# Patient Record
Sex: Female | Born: 1960
Health system: Southern US, Community
[De-identification: ages and names within clinical notes are randomized; demographics above are authoritative.]

## PROBLEM LIST (undated history)

## (undated) DIAGNOSIS — R0609 Other forms of dyspnea: Secondary | ICD-10-CM

## (undated) DIAGNOSIS — D219 Benign neoplasm of connective and other soft tissue, unspecified: Secondary | ICD-10-CM

## (undated) DIAGNOSIS — R51 Headache: Secondary | ICD-10-CM

## (undated) DIAGNOSIS — K219 Gastro-esophageal reflux disease without esophagitis: Secondary | ICD-10-CM

## (undated) DIAGNOSIS — R0600 Dyspnea, unspecified: Secondary | ICD-10-CM

## (undated) DIAGNOSIS — E669 Obesity, unspecified: Secondary | ICD-10-CM

## (undated) DIAGNOSIS — M1990 Unspecified osteoarthritis, unspecified site: Secondary | ICD-10-CM

## (undated) DIAGNOSIS — J40 Bronchitis, not specified as acute or chronic: Secondary | ICD-10-CM

## (undated) DIAGNOSIS — Z720 Tobacco use: Secondary | ICD-10-CM

## (undated) DIAGNOSIS — R002 Palpitations: Secondary | ICD-10-CM

## (undated) DIAGNOSIS — I219 Acute myocardial infarction, unspecified: Secondary | ICD-10-CM

## (undated) DIAGNOSIS — I639 Cerebral infarction, unspecified: Secondary | ICD-10-CM

## (undated) DIAGNOSIS — E785 Hyperlipidemia, unspecified: Secondary | ICD-10-CM

## (undated) DIAGNOSIS — F329 Major depressive disorder, single episode, unspecified: Secondary | ICD-10-CM

## (undated) DIAGNOSIS — F32A Depression, unspecified: Secondary | ICD-10-CM

## (undated) DIAGNOSIS — F418 Other specified anxiety disorders: Secondary | ICD-10-CM

## (undated) DIAGNOSIS — J449 Chronic obstructive pulmonary disease, unspecified: Secondary | ICD-10-CM

## (undated) DIAGNOSIS — I359 Nonrheumatic aortic valve disorder, unspecified: Secondary | ICD-10-CM

## (undated) DIAGNOSIS — I10 Essential (primary) hypertension: Secondary | ICD-10-CM

## (undated) DIAGNOSIS — R55 Syncope and collapse: Secondary | ICD-10-CM

## (undated) HISTORY — DX: Unspecified osteoarthritis, unspecified site: M19.90

## (undated) HISTORY — DX: Hyperlipidemia, unspecified: E78.5

## (undated) HISTORY — DX: Nonrheumatic aortic valve disorder, unspecified: I35.9

## (undated) HISTORY — DX: Bronchitis, not specified as acute or chronic: J40

## (undated) HISTORY — DX: Obesity, unspecified: E66.9

## (undated) HISTORY — DX: Dyspnea, unspecified: R06.00

## (undated) HISTORY — DX: Essential (primary) hypertension: I10

## (undated) HISTORY — DX: Syncope and collapse: R55

## (undated) HISTORY — DX: Benign neoplasm of connective and other soft tissue, unspecified: D21.9

## (undated) HISTORY — PX: TUBAL LIGATION: SHX77

## (undated) HISTORY — DX: Tobacco use: Z72.0

## (undated) HISTORY — DX: Other specified anxiety disorders: F41.8

## (undated) HISTORY — DX: Other forms of dyspnea: R06.09

## (undated) HISTORY — DX: Palpitations: R00.2

---

## 2007-01-23 ENCOUNTER — Ambulatory Visit (HOSPITAL_COMMUNITY): Admission: RE | Admit: 2007-01-23 | Discharge: 2007-01-23 | Payer: Self-pay | Admitting: Family Medicine

## 2008-05-16 ENCOUNTER — Encounter: Admission: RE | Admit: 2008-05-16 | Discharge: 2008-05-16 | Payer: Self-pay | Admitting: General Surgery

## 2008-12-12 ENCOUNTER — Ambulatory Visit (HOSPITAL_COMMUNITY): Admission: RE | Admit: 2008-12-12 | Discharge: 2008-12-12 | Payer: Self-pay | Admitting: Family Medicine

## 2009-07-09 ENCOUNTER — Ambulatory Visit (HOSPITAL_COMMUNITY): Admission: RE | Admit: 2009-07-09 | Discharge: 2009-07-09 | Payer: Self-pay | Admitting: Family Medicine

## 2009-12-14 ENCOUNTER — Emergency Department (HOSPITAL_COMMUNITY): Admission: EM | Admit: 2009-12-14 | Discharge: 2009-12-14 | Payer: Self-pay | Admitting: Emergency Medicine

## 2010-09-01 ENCOUNTER — Ambulatory Visit: Payer: Self-pay | Admitting: Cardiology

## 2010-09-01 ENCOUNTER — Ambulatory Visit (HOSPITAL_COMMUNITY): Admission: RE | Admit: 2010-09-01 | Discharge: 2010-09-01 | Payer: Self-pay | Admitting: Gastroenterology

## 2010-09-01 ENCOUNTER — Encounter: Payer: Self-pay | Admitting: Gastroenterology

## 2010-10-06 ENCOUNTER — Encounter (HOSPITAL_COMMUNITY)
Admission: RE | Admit: 2010-10-06 | Discharge: 2010-11-05 | Payer: Self-pay | Source: Home / Self Care | Attending: Neurology | Admitting: Neurology

## 2010-11-06 ENCOUNTER — Encounter (HOSPITAL_COMMUNITY)
Admission: RE | Admit: 2010-11-06 | Discharge: 2010-12-06 | Payer: Self-pay | Source: Home / Self Care | Attending: Neurology | Admitting: Neurology

## 2011-05-07 ENCOUNTER — Encounter: Payer: Self-pay | Admitting: Cardiology

## 2011-05-07 DIAGNOSIS — E669 Obesity, unspecified: Secondary | ICD-10-CM | POA: Insufficient documentation

## 2011-05-07 DIAGNOSIS — E119 Type 2 diabetes mellitus without complications: Secondary | ICD-10-CM | POA: Insufficient documentation

## 2011-05-07 DIAGNOSIS — I10 Essential (primary) hypertension: Secondary | ICD-10-CM | POA: Insufficient documentation

## 2011-05-07 DIAGNOSIS — R55 Syncope and collapse: Secondary | ICD-10-CM | POA: Insufficient documentation

## 2011-05-10 ENCOUNTER — Encounter: Payer: Self-pay | Admitting: *Deleted

## 2011-05-10 ENCOUNTER — Encounter: Payer: Self-pay | Admitting: Cardiology

## 2011-05-10 ENCOUNTER — Ambulatory Visit (INDEPENDENT_AMBULATORY_CARE_PROVIDER_SITE_OTHER): Payer: Self-pay | Admitting: Cardiology

## 2011-05-10 DIAGNOSIS — E119 Type 2 diabetes mellitus without complications: Secondary | ICD-10-CM

## 2011-05-10 DIAGNOSIS — R55 Syncope and collapse: Secondary | ICD-10-CM

## 2011-05-10 DIAGNOSIS — R0609 Other forms of dyspnea: Secondary | ICD-10-CM

## 2011-05-10 DIAGNOSIS — F418 Other specified anxiety disorders: Secondary | ICD-10-CM

## 2011-05-10 DIAGNOSIS — R0600 Dyspnea, unspecified: Secondary | ICD-10-CM

## 2011-05-10 DIAGNOSIS — R002 Palpitations: Secondary | ICD-10-CM

## 2011-05-10 DIAGNOSIS — I10 Essential (primary) hypertension: Secondary | ICD-10-CM

## 2011-05-10 DIAGNOSIS — E785 Hyperlipidemia, unspecified: Secondary | ICD-10-CM

## 2011-05-10 DIAGNOSIS — F17200 Nicotine dependence, unspecified, uncomplicated: Secondary | ICD-10-CM

## 2011-05-10 DIAGNOSIS — I359 Nonrheumatic aortic valve disorder, unspecified: Secondary | ICD-10-CM

## 2011-05-10 DIAGNOSIS — Z720 Tobacco use: Secondary | ICD-10-CM

## 2011-05-10 NOTE — Patient Instructions (Signed)
Your physician recommends that you schedule a follow-up appointment in:  After tests Your physician recommends that you return for lab work in: next week A chest x-ray takes a picture of the organs and structures inside the chest, including the heart, lungs, and blood vessels. This test can show several things, including, whether the heart is enlarges; whether fluid is building up in the lungs; and whether pacemaker / defibrillator leads are still in place. Your physician has requested that you have en exercise stress myoview. For further information please visit [email or web address] Please follow instruction sheet, as given.  Your physician has recommended that you have a pulmonary function test. Pulmonary Function Tests are a group of tests that measure how well air moves in and out of your lungs.  A chest x-ray takes a picture of the organs and structures inside the chest, including the heart, lungs, and blood vessels. This test can show several things, including, whether the heart is enlarges; whether fluid is building up in the lungs; and whether pacemaker / defibrillator leads are still in place.

## 2011-05-11 ENCOUNTER — Other Ambulatory Visit: Payer: Self-pay | Admitting: Cardiology

## 2011-05-11 ENCOUNTER — Encounter: Payer: Self-pay | Admitting: Cardiology

## 2011-05-11 DIAGNOSIS — F418 Other specified anxiety disorders: Secondary | ICD-10-CM | POA: Insufficient documentation

## 2011-05-11 DIAGNOSIS — R002 Palpitations: Secondary | ICD-10-CM | POA: Insufficient documentation

## 2011-05-11 DIAGNOSIS — I359 Nonrheumatic aortic valve disorder, unspecified: Secondary | ICD-10-CM | POA: Insufficient documentation

## 2011-05-11 DIAGNOSIS — Z720 Tobacco use: Secondary | ICD-10-CM | POA: Insufficient documentation

## 2011-05-11 NOTE — Assessment & Plan Note (Signed)
Diabetes is mild and apparently adequately controlled with medical therapy.  Weight loss would be desirable.

## 2011-05-11 NOTE — Assessment & Plan Note (Addendum)
Examination suggests the presence of aortic sclerosis or mild aortic stenosis.  Considering the fact that the murmur has reportedly been present for years, a bicuspid aortic valve is most likely.  An echocardiogram performed in 08/2010 revealed aortic annular calcification with mild insufficiency and no stenosis.  No other abnormalities were identified.  Repeat echocardiography will be deferred for the present.

## 2011-05-11 NOTE — Assessment & Plan Note (Signed)
Patient reports episodes of rapid heart action on a daily basis.  A Holter study will be obtained to further investigate.

## 2011-05-11 NOTE — Assessment & Plan Note (Addendum)
Symptoms are likely multifactorial in etiology with a definite contribution of obesity and physical deconditioning.  Sleep apnea or hypoventilation obesity syndrome may be present.  Her asthma appears mild, but PFTs, and ABG and a chest x-ray will be obtained as well as basic laboratory studies including a TSH, proBNP level and d-dimer.  I will reassess this nice woman once testing has been completed.

## 2011-05-11 NOTE — Assessment & Plan Note (Addendum)
Blood pressure control appears to be excellent; current medications will be continued.

## 2011-05-11 NOTE — Progress Notes (Signed)
HPI:  Ms. Murley is seen in the office at the kind request of the [facility] for evaluation of syncope.  This nice woman has not previously been evaluated by cardiologist nor undergone any major cardiac testing.  After auscultation of a murmur, an echocardiogram reportedly demonstrated a minor aortic valve problem.  During the past 2 months she has experienced multiple episodes of loss of consciousness.  These are only associated with exertion, but occur at a very low level of exercise.  She experiences dyspnea, then lightheadedness and subsequently falls to the floor.  Loss of consciousness persists for no more than one minute.  She awakens without any confusion.  There has been no loss of bowel or bladder function.  She is not known to have absent epilepsy or other neurologic conditions.  She has not sustained any significant injury.  She describes mild orthostatic dizziness.  Records were obtained from the [facility] and reviewed.  She has received advice regarding obesity and cigarette smoking, but has lost no weight and only tapered tobacco consumption.  CBC normal in 03/2011 as was chemistry profile except for a glucose of 116.  TSH-1.9 with a normal free T4.  Hemoglobin A1c-7.0.  Stool for occult blood negative.  Lipid profile good with TC-178, triglycerides of 128, HDL 49 and LDL of 98.   Current Outpatient Prescriptions on File Prior to Visit  Medication Sig Dispense Refill  . acetaminophen (TYLENOL) 650 MG CR tablet Take 650 mg by mouth every 8 (eight) hours as needed.        Marland Kitchen amLODipine (NORVASC) 10 MG tablet Take 5 mg by mouth daily.         Allergies  Allergen Reactions  . Aspirin     hives  . Penicillins     rash      Past Medical History  Diagnosis Date  . Syncope   . Dyspnea   . DM (diabetes mellitus)   . Hypertension   . Obesity   . Asthma   . Tobacco user     30 pack years; 04/2011: 1/4 pack per day during quick attempt     Past Surgical History    Procedure Date  . Tubal ligation      Family History  Problem Relation Age of Onset  . Lung cancer Mother   . Heart disease Brother   . Breast cancer Sister      History   Social History  . Marital Status: Single    Spouse Name: N/A    Number of Children: 4  . Years of Education: N/A   Occupational History  . unemployed    Social History Main Topics  . Smoking status: Current Everyday Smoker -- 0.3 packs/day    Types: Cigarettes  . Smokeless tobacco: Never Used  . Alcohol Use: No  . Drug Use: No  . Sexually Active: Not on file   Other Topics Concern  . Not on file   Social History Narrative  . No narrative on file     ROS:   Intermittent left-sided headache; requires corrective lenses; mild intermittent palpitations; history of gastric mass-nature uncertain; gastroesophageal reflux disease symptoms; urinary frequency; arthritic discomfort in the knees and right elbow; intermittent pedal edema; intermittent mild asthmatic symptoms-multiple Emergency Room visits but no hospital admissions   All other systems reviewed and are negative.  PHYSICAL EXAM: BP 120/69  Pulse 73  Ht 5\' 6"  (1.676 m)  Wt 299 lb (135.626 kg)  BMI 48.26  kg/m2  SpO2 97%  General-Well-developed; no acute distress Body Habitus-obese HEENT-[place]/AT; PERRL; EOM intact; conjunctiva and lids nl Neck-No JVD; no carotid bruits; normal carotid upstrokes Endocrine-No thyromegaly Lungs-Clear lung fields; resonant percussion; normal I-to-E ratio; decreased breath sounds at the bases Cardiovascular- normal PMI; normal S1 and S2; grade 2/6 basilar systolic ejection murmur Abdomen-BS normal; soft and non-tender without masses or organomegaly Musculoskeletal-No deformities, cyanosis or clubbing Neurologic-Nl cranial nerves; symmetric strength and tone Skin- Warm, no significant lesions Extremities-Nl distal pulses; trace edema   EKG:  Normal sinus rhythm; left atrial abnormality; delayed R wave  progression-cannot exclude previous anteroseptal MI; no previous tracing for comparison.  ASSESSMENT AND PLAN:

## 2011-05-11 NOTE — Assessment & Plan Note (Addendum)
Exertional syncope is certainly of concern.  HOCM and aortic stenosis were excluded by previous echocardiography and by her current examination.  There is no prolongation of the QT interval nor other significant abnormalities on EKG.  A stress Myoview will be performed to evaluate exercise tolerance, to evaluate exercise oxygenation, to exclude arrhythmias and to exclude the low possibility of myocardial ischemia.

## 2011-05-12 LAB — LIPID PANEL
Cholesterol: 175 mg/dL (ref 0–200)
HDL: 52 mg/dL (ref >39)
Total CHOL/HDL Ratio: 3.4 Ratio

## 2011-05-12 LAB — CBC WITH DIFFERENTIAL/PLATELET
Eosinophils Absolute: 0.1 10*3/uL (ref 0.0–0.7)
Hemoglobin: 12.2 g/dL (ref 12.0–15.0)
Lymphs Abs: 2.1 10*3/uL (ref 0.7–4.0)
MCH: 30.6 pg (ref 26.0–34.0)
MCV: 94.5 fL (ref 78.0–100.0)
Monocytes Relative: 10 % (ref 3–12)
Neutrophils Relative %: 44 % (ref 43–77)
RBC: 3.99 MIL/uL (ref 3.87–5.11)

## 2011-05-12 LAB — COMPREHENSIVE METABOLIC PANEL
Alkaline Phosphatase: 86 U/L (ref 39–117)
BUN: 12 mg/dL (ref 6–23)
Creat: 0.71 mg/dL (ref 0.50–1.10)
Glucose, Bld: 121 mg/dL — ABNORMAL HIGH (ref 70–99)
Total Bilirubin: 0.6 mg/dL (ref 0.3–1.2)

## 2011-05-12 LAB — D-DIMER, QUANTITATIVE: D-Dimer, Quant: 0.38 ug/mL-FEU (ref 0.00–0.48)

## 2011-05-17 ENCOUNTER — Other Ambulatory Visit: Payer: Self-pay | Admitting: Cardiology

## 2011-05-17 DIAGNOSIS — R55 Syncope and collapse: Secondary | ICD-10-CM

## 2011-05-19 ENCOUNTER — Encounter (HOSPITAL_COMMUNITY): Payer: Self-pay

## 2011-05-19 ENCOUNTER — Ambulatory Visit (HOSPITAL_COMMUNITY)
Admission: RE | Admit: 2011-05-19 | Discharge: 2011-05-19 | Disposition: A | Discharge status: Home / Self Care | Payer: Self-pay | Source: Ambulatory Visit | Attending: Cardiology | Admitting: Cardiology

## 2011-05-19 ENCOUNTER — Encounter (HOSPITAL_COMMUNITY)
Admission: RE | Admit: 2011-05-19 | Discharge: 2011-05-19 | Disposition: A | Discharge status: Home / Self Care | Payer: Self-pay | Source: Ambulatory Visit | Attending: Cardiology | Admitting: Cardiology

## 2011-05-19 ENCOUNTER — Ambulatory Visit (INDEPENDENT_AMBULATORY_CARE_PROVIDER_SITE_OTHER): Payer: Self-pay | Admitting: *Deleted

## 2011-05-19 DIAGNOSIS — R0602 Shortness of breath: Secondary | ICD-10-CM

## 2011-05-19 DIAGNOSIS — R55 Syncope and collapse: Secondary | ICD-10-CM

## 2011-05-19 DIAGNOSIS — R079 Chest pain, unspecified: Secondary | ICD-10-CM | POA: Insufficient documentation

## 2011-05-19 DIAGNOSIS — R0609 Other forms of dyspnea: Secondary | ICD-10-CM | POA: Insufficient documentation

## 2011-05-19 DIAGNOSIS — R0989 Other specified symptoms and signs involving the circulatory and respiratory systems: Secondary | ICD-10-CM | POA: Insufficient documentation

## 2011-05-19 LAB — BLOOD GAS, ARTERIAL
FIO2: 0.21 %
O2 Saturation: 97.3 %
Patient temperature: 37
pH, Arterial: 7.396 (ref 7.350–7.400)

## 2011-05-19 MED ORDER — TECHNETIUM TC 99M TETROFOSMIN IV KIT
10.0000 | PACK | Freq: Once | INTRAVENOUS | Status: AC | PRN
  Administered 2011-05-19: 10.5 via INTRAVENOUS

## 2011-05-19 MED ORDER — TECHNETIUM TC 99M TETROFOSMIN IV KIT
30.0000 | PACK | Freq: Once | INTRAVENOUS | Status: AC | PRN
  Administered 2011-05-19: 31 via INTRAVENOUS

## 2011-05-19 NOTE — Progress Notes (Deleted)

## 2011-05-19 NOTE — Progress Notes (Signed)
I can not close 

## 2011-05-20 NOTE — Progress Notes (Signed)
Still not right

## 2011-05-31 ENCOUNTER — Encounter: Payer: Self-pay | Admitting: Adult Health

## 2011-06-10 ENCOUNTER — Ambulatory Visit (INDEPENDENT_AMBULATORY_CARE_PROVIDER_SITE_OTHER): Payer: Self-pay | Admitting: Cardiology

## 2011-06-10 ENCOUNTER — Ambulatory Visit (HOSPITAL_COMMUNITY)
Admission: RE | Admit: 2011-06-10 | Discharge: 2011-06-10 | Disposition: A | Discharge status: Home / Self Care | Payer: Self-pay | Source: Ambulatory Visit | Attending: Cardiology | Admitting: Cardiology

## 2011-06-10 ENCOUNTER — Encounter: Payer: Self-pay | Admitting: Cardiology

## 2011-06-10 DIAGNOSIS — I10 Essential (primary) hypertension: Secondary | ICD-10-CM | POA: Insufficient documentation

## 2011-06-10 DIAGNOSIS — R0609 Other forms of dyspnea: Secondary | ICD-10-CM | POA: Insufficient documentation

## 2011-06-10 DIAGNOSIS — E119 Type 2 diabetes mellitus without complications: Secondary | ICD-10-CM

## 2011-06-10 DIAGNOSIS — R0600 Dyspnea, unspecified: Secondary | ICD-10-CM

## 2011-06-10 DIAGNOSIS — R55 Syncope and collapse: Secondary | ICD-10-CM

## 2011-06-10 DIAGNOSIS — R0989 Other specified symptoms and signs involving the circulatory and respiratory systems: Secondary | ICD-10-CM | POA: Insufficient documentation

## 2011-06-10 DIAGNOSIS — R0602 Shortness of breath: Secondary | ICD-10-CM

## 2011-06-10 NOTE — Patient Instructions (Addendum)
Your physician recommends that you return for lab work in: TODAY  Your physician recommends that you start Pulmonary rehab  Your physician recommends that you schedule a follow-up appointment in: 6 weeks

## 2011-06-10 NOTE — Assessment & Plan Note (Signed)
Etiology of exercise-induced syncope is unclear.  Symptoms may simply be due to severe physical deconditioning, obesity and moderate chronic lung disease.  I have referred Amber Harris to Pulmonary Rehabilitation in an attempt to increase her exercise capacity.  I will reevaluate this nice woman after she has completed the program in a few months.

## 2011-06-10 NOTE — Progress Notes (Signed)
HPI : Amber Harris returns to the office for continuing assessment and treatment of syncope.  Since her last visit, she has restricted her activity, which has prevented falls or apparent loss of consciousness.  She did attempt to perform a treadmill stress test, but had to be supported during low-level exercise and required conversion of the study to pharmacologic stress, which was negative.  A 48 hour Holter revealed no arrhythmias.  PFTs show mixed restrictive and obstructive disease with a normal ABG.  Chest x-ray is pending.  Patient has never discussed bariatric surgery nor has she been evaluated by an expert in obesity.  Surgical intervention may be appropriate for her.  Current Outpatient Prescriptions on File Prior to Visit  Medication Sig Dispense Refill  . acetaminophen (TYLENOL) 650 MG CR tablet Take 650 mg by mouth every 8 (eight) hours as needed.        Marland Kitchen albuterol (PROVENTIL) 90 MCG/ACT inhaler Inhale 2 puffs into the lungs every 6 (six) hours as needed.        Marland Kitchen lisinopril-hydrochlorothiazide (PRINZIDE,ZESTORETIC) 20-12.5 MG per tablet Take 1 tablet by mouth daily.        . metFORMIN (GLUMETZA) 500 MG (MOD) 24 hr tablet Take 500 mg by mouth daily with breakfast.        . ranitidine (ZANTAC) 150 MG capsule Take 150 mg by mouth daily.        . sertraline (ZOLOFT) 50 MG tablet Take 50 mg by mouth daily.        . traZODone (DESYREL) 100 MG tablet Take 100 mg by mouth at bedtime. Take 1/2 tab at bedtime          Allergies  Allergen Reactions  . Aspirin     hives  . Penicillins     rash      Past medical history, social history, and family history reviewed and updated.  ROS: No chest pain, orthopnea, PND or increase in mild chronic pedal edema.  No cough, sputum or fevers.  PHYSICAL EXAM: BP 129/79  Pulse 56  Ht 5\' 6"  (1.676 m)  Wt 136.079 kg (300 lb)  BMI 48.42 kg/m2  SpO2 97%  LMP 06/09/2011  General-Well developed; no acute distress Body habitus-moderately obese Neck-No  JVD; no carotid bruits Lungs-clear lung fields with decreased breath sounds; resonant to percussion Cardiovascular-normal PMI; normal S1 and S2; basilar systolic ejection murmur Abdomen-normal bowel sounds; soft and non-tender without masses or organomegaly Musculoskeletal-No deformities, no cyanosis or clubbing Neurologic-Normal cranial nerves; symmetric strength and tone Skin-Warm, no significant lesions Extremities-distal pulses intact; trace edema  Laboratory:  D. Dimer-normal; Lipid profile acceptable with values of 175, 100, 52 and 103; normal metabolic profile except for fasting glucose of 121; TSH ordered but not performed; normal CBC.  ASSESSMENT AND PLAN:

## 2011-06-11 LAB — TSH: TSH: 1.055 u[IU]/mL (ref 0.350–4.500)

## 2011-06-15 ENCOUNTER — Encounter: Payer: Self-pay | Admitting: *Deleted

## 2011-06-16 NOTE — Procedures (Signed)
NAMEVICIE, Amber Harris              ACCOUNT NO.:  [id number]  MEDICAL RECORD NO.:  [id number]  LOCATION:                                 FACILITY:  PHYSICIAN:  Gerrit Friends. Dietrich Pates, MD, FACCDATE OF BIRTH:  05-21-1961  DATE OF PROCEDURE: DATE OF DISCHARGE:                               HOLTER MONITOR   REFERRING PHYSICIAN:  Gerrit Friends. Dietrich Pates, MD, [facility]  CLINICAL DATA:  A 50 year old woman with syncope. 1. Continuous electrocardiographic recording was maintained for 47     hours and 30 minutes during which the predominant rhythm was normal     sinus with modest sinus tachycardia and sinus bradycardia.  The     former reached a peak of 137 bpm and the latter a nadir of 44 bpm. 2. No significant arrhythmias were identified.  Rare supraventricular     ectopics occurred at an average rate of fewer than 1 per hour.  No     PVCs or other ventricular arrhythmias occurred. 3. No ST-segment elevation or depression was identified. 4. A complete diary of activity was returned with 10 symptomatic     spells, 1 with palpitations, 5 with dyspnea, not necessarily     occurring with exertion and 4 with dizziness.  In all cases, the     rhythm was normal sinus.  IMPRESSION:  Negative continuous electrocardiographic recording demonstrating no significant arrhythmias and no correlation between EKG and symptoms.     Gerrit Friends. Dietrich Pates, MD, [facility]     RMR/MEDQ  D:  05/28/2011  T:  05/29/2011  Job:  [id number]

## 2011-07-16 ENCOUNTER — Other Ambulatory Visit (HOSPITAL_COMMUNITY)
Admission: RE | Admit: 2011-07-16 | Discharge: 2011-07-16 | Disposition: A | Discharge status: Home / Self Care | Payer: Self-pay | Source: Ambulatory Visit | Attending: Obstetrics and Gynecology | Admitting: Obstetrics and Gynecology

## 2011-07-16 ENCOUNTER — Other Ambulatory Visit: Payer: Self-pay | Admitting: Obstetrics and Gynecology

## 2011-07-16 DIAGNOSIS — Z01419 Encounter for gynecological examination (general) (routine) without abnormal findings: Secondary | ICD-10-CM | POA: Insufficient documentation

## 2011-07-16 DIAGNOSIS — Z113 Encounter for screening for infections with a predominantly sexual mode of transmission: Secondary | ICD-10-CM | POA: Insufficient documentation

## 2011-07-22 ENCOUNTER — Encounter (HOSPITAL_COMMUNITY)
Admission: RE | Admit: 2011-07-22 | Discharge: 2011-07-22 | Disposition: A | Discharge status: Home / Self Care | Payer: Self-pay | Source: Ambulatory Visit | Attending: Cardiology | Admitting: Cardiology

## 2011-07-22 ENCOUNTER — Encounter (HOSPITAL_COMMUNITY): Payer: Self-pay

## 2011-07-22 DIAGNOSIS — R0609 Other forms of dyspnea: Secondary | ICD-10-CM | POA: Insufficient documentation

## 2011-07-22 DIAGNOSIS — R0989 Other specified symptoms and signs involving the circulatory and respiratory systems: Secondary | ICD-10-CM | POA: Insufficient documentation

## 2011-07-22 DIAGNOSIS — Z5189 Encounter for other specified aftercare: Secondary | ICD-10-CM | POA: Insufficient documentation

## 2011-07-22 DIAGNOSIS — F17200 Nicotine dependence, unspecified, uncomplicated: Secondary | ICD-10-CM | POA: Insufficient documentation

## 2011-07-22 DIAGNOSIS — R002 Palpitations: Secondary | ICD-10-CM | POA: Insufficient documentation

## 2011-07-22 NOTE — Progress Notes (Signed)
Encounter addended by: Rolene Course on: 07/22/2011  2:18 PM<BR>     Documentation filed: Normajean Glasgow VN, Chief Complaint Section

## 2011-07-22 NOTE — Progress Notes (Signed)
Orientation completed. Pt is registered. Pt is scheduled to start on Monday 07/26/11 at 1:00pm. Pt is eager to get started but a little nervous about walking on Treadmill.

## 2011-07-22 NOTE — Patient Instructions (Signed)
During orientation advised patient on arrival and appointment times what to wear, what to do before, during and after exercise. Reviewed attendance and class policy. Talked about inclement weather and class consultation policy.   

## 2011-07-23 ENCOUNTER — Encounter: Payer: Self-pay | Admitting: Cardiology

## 2011-07-26 ENCOUNTER — Encounter (HOSPITAL_COMMUNITY)
Admission: RE | Admit: 2011-07-26 | Discharge: 2011-07-26 | Disposition: A | Discharge status: Home / Self Care | Payer: Self-pay | Source: Ambulatory Visit

## 2011-07-26 ENCOUNTER — Encounter: Payer: Self-pay | Admitting: Cardiology

## 2011-07-26 ENCOUNTER — Ambulatory Visit (INDEPENDENT_AMBULATORY_CARE_PROVIDER_SITE_OTHER): Payer: Self-pay | Admitting: Cardiology

## 2011-07-26 DIAGNOSIS — Z720 Tobacco use: Secondary | ICD-10-CM

## 2011-07-26 DIAGNOSIS — F17200 Nicotine dependence, unspecified, uncomplicated: Secondary | ICD-10-CM

## 2011-07-26 DIAGNOSIS — R002 Palpitations: Secondary | ICD-10-CM

## 2011-07-26 DIAGNOSIS — R0989 Other specified symptoms and signs involving the circulatory and respiratory systems: Secondary | ICD-10-CM

## 2011-07-26 MED ORDER — RANITIDINE HCL 150 MG PO CAPS: 150.0000 mg | ORAL_CAPSULE | Freq: Two times a day (BID) | ORAL | Status: DC

## 2011-07-26 NOTE — Progress Notes (Signed)
HPI : [name] returns to the office for continued assessment and management of syncope, palpitations and exertional dyspnea.  She just started pulmonary rehabilitation and tolerated her initial exercise well.  She is scheduled to meet with a dietitian in the near future.  She has noted some increase in single brief palpitations, but no syncope and improved dyspnea.  She experiences aching chest discomfort after retiring for the evening.  She's been taking only 150 mg of ranitidine per day with evening dosing.  Current Outpatient Prescriptions on File Prior to Visit  Medication Sig Dispense Refill  . acetaminophen (TYLENOL) 650 MG CR tablet Take 650 mg by mouth every 8 (eight) hours as needed.        Marland Kitchen albuterol (PROVENTIL) 90 MCG/ACT inhaler Inhale 2 puffs into the lungs every 6 (six) hours as needed.        . gabapentin (NEURONTIN) 100 MG tablet Take 100 mg by mouth 3 (three) times daily.       Marland Kitchen lisinopril-hydrochlorothiazide (PRINZIDE,ZESTORETIC) 20-12.5 MG per tablet Take 1 tablet by mouth 2 (two) times daily.       . metFORMIN (GLUMETZA) 500 MG (MOD) 24 hr tablet Take 500 mg by mouth daily with breakfast.        . metoprolol (LOPRESSOR) 50 MG tablet Take 50 mg by mouth daily.        . sertraline (ZOLOFT) 50 MG tablet Take 100 mg by mouth daily.       . traZODone (DESYREL) 100 MG tablet Take 100 mg by mouth at bedtime. Take 1/2 tab at bedtime         Allergies  Allergen Reactions  . Aspirin     hives  . Penicillins     rash      Past medical history, social history, and family history reviewed and updated.  PHYSICAL EXAM: BP 170/93  Pulse 68  Resp 18  Ht 5\' 6"  (1.676 m)  Wt 296 lb (134.265 kg)  BMI 47.78 kg/m2  General-Well developed; no acute distress Body habitus-marked obesity Neck-No JVD; no carotid bruits; fullness in the left neck, likely lipomatous tissue Lungs-clear lung fields; resonant to percussion; decreased breath sounds at the bases Cardiovascular-normal PMI;  normal S1 and S2 Abdomen-normal bowel sounds; soft and non-tender without masses or organomegaly Musculoskeletal-No deformities, no cyanosis or clubbing Neurologic-Normal cranial nerves; symmetric strength and tone Skin-Warm, no significant lesions Extremities-distal pulses intact; trace edema  EKG: Normal sinus rhythm; borderline left atrial abnormality; slightly delayed R-wave progression; otherwise normal.  No previous tracing for comparison.  ASSESSMENT AND PLAN:

## 2011-07-26 NOTE — Patient Instructions (Signed)
   Increase Zantac to 150mg  twice a day Continue all other current medications. Follow up in  4 months

## 2011-07-26 NOTE — Assessment & Plan Note (Addendum)
Symptoms are stable to improved.  Initial exercise tolerance in the rehabilitation program has beem reasonable.  There is an excellent likelihood that she will improve with further participation.  Dietary therapy with weight loss would likely provide even better functional status.  I will reassess this nice woman when she has had an additional 4 months of exercise and diet therapy.  If there has been no improvement in weight, further consideration can be given to surgical approaches.

## 2011-07-28 ENCOUNTER — Encounter (HOSPITAL_COMMUNITY)
Admission: RE | Admit: 2011-07-28 | Discharge: 2011-07-28 | Disposition: A | Discharge status: Home / Self Care | Payer: Self-pay | Source: Ambulatory Visit

## 2011-08-02 ENCOUNTER — Encounter (HOSPITAL_COMMUNITY): Payer: Self-pay

## 2011-08-03 ENCOUNTER — Other Ambulatory Visit: Payer: Self-pay | Admitting: Obstetrics and Gynecology

## 2011-08-03 ENCOUNTER — Other Ambulatory Visit (HOSPITAL_COMMUNITY)
Admission: RE | Admit: 2011-08-03 | Discharge: 2011-08-03 | Disposition: A | Discharge status: Home / Self Care | Payer: Self-pay | Source: Ambulatory Visit | Attending: Obstetrics and Gynecology | Admitting: Obstetrics and Gynecology

## 2011-08-03 DIAGNOSIS — Z01419 Encounter for gynecological examination (general) (routine) without abnormal findings: Secondary | ICD-10-CM | POA: Insufficient documentation

## 2011-08-04 ENCOUNTER — Encounter (HOSPITAL_COMMUNITY): Payer: Self-pay

## 2011-08-09 ENCOUNTER — Encounter: Payer: Self-pay | Admitting: Cardiology

## 2011-08-09 ENCOUNTER — Encounter (HOSPITAL_COMMUNITY)
Admission: RE | Admit: 2011-08-09 | Discharge: 2011-08-09 | Disposition: A | Discharge status: Home / Self Care | Payer: Self-pay | Source: Ambulatory Visit | Attending: Cardiology | Admitting: Cardiology

## 2011-08-09 DIAGNOSIS — R0609 Other forms of dyspnea: Secondary | ICD-10-CM | POA: Insufficient documentation

## 2011-08-09 DIAGNOSIS — R002 Palpitations: Secondary | ICD-10-CM | POA: Insufficient documentation

## 2011-08-09 DIAGNOSIS — Z5189 Encounter for other specified aftercare: Secondary | ICD-10-CM | POA: Insufficient documentation

## 2011-08-09 DIAGNOSIS — R0989 Other specified symptoms and signs involving the circulatory and respiratory systems: Secondary | ICD-10-CM | POA: Insufficient documentation

## 2011-08-09 DIAGNOSIS — F17200 Nicotine dependence, unspecified, uncomplicated: Secondary | ICD-10-CM | POA: Insufficient documentation

## 2011-08-11 ENCOUNTER — Encounter (HOSPITAL_COMMUNITY)
Admission: RE | Admit: 2011-08-11 | Discharge: 2011-08-11 | Disposition: A | Discharge status: Home / Self Care | Payer: Self-pay | Source: Ambulatory Visit | Attending: Cardiology | Admitting: Cardiology

## 2011-08-16 ENCOUNTER — Encounter (HOSPITAL_COMMUNITY): Payer: Self-pay

## 2011-08-18 ENCOUNTER — Encounter (HOSPITAL_COMMUNITY)
Admission: RE | Admit: 2011-08-18 | Discharge: 2011-08-18 | Disposition: A | Discharge status: Home / Self Care | Payer: Self-pay | Source: Ambulatory Visit | Attending: Cardiology | Admitting: Cardiology

## 2011-08-23 ENCOUNTER — Encounter (HOSPITAL_COMMUNITY)
Admission: RE | Admit: 2011-08-23 | Discharge: 2011-08-23 | Disposition: A | Discharge status: Home / Self Care | Payer: Self-pay | Source: Ambulatory Visit | Attending: Cardiology | Admitting: Cardiology

## 2011-08-25 ENCOUNTER — Encounter (HOSPITAL_COMMUNITY): Payer: Self-pay

## 2011-08-30 ENCOUNTER — Encounter (HOSPITAL_COMMUNITY): Payer: Self-pay

## 2011-09-01 ENCOUNTER — Encounter (HOSPITAL_COMMUNITY): Payer: Self-pay

## 2011-09-06 ENCOUNTER — Encounter (HOSPITAL_COMMUNITY): Payer: Self-pay

## 2011-09-08 ENCOUNTER — Encounter (HOSPITAL_COMMUNITY)
Admission: RE | Admit: 2011-09-08 | Discharge: 2011-09-08 | Disposition: A | Discharge status: Home / Self Care | Payer: Self-pay | Source: Ambulatory Visit | Attending: Cardiology | Admitting: Cardiology

## 2011-09-08 DIAGNOSIS — Z5189 Encounter for other specified aftercare: Secondary | ICD-10-CM | POA: Insufficient documentation

## 2011-09-08 DIAGNOSIS — R002 Palpitations: Secondary | ICD-10-CM | POA: Insufficient documentation

## 2011-09-08 DIAGNOSIS — R0609 Other forms of dyspnea: Secondary | ICD-10-CM | POA: Insufficient documentation

## 2011-09-08 DIAGNOSIS — R0989 Other specified symptoms and signs involving the circulatory and respiratory systems: Secondary | ICD-10-CM | POA: Insufficient documentation

## 2011-09-08 DIAGNOSIS — F17200 Nicotine dependence, unspecified, uncomplicated: Secondary | ICD-10-CM | POA: Insufficient documentation

## 2011-09-12 ENCOUNTER — Other Ambulatory Visit: Payer: Self-pay | Admitting: Obstetrics and Gynecology

## 2011-09-13 ENCOUNTER — Encounter (HOSPITAL_COMMUNITY)
Admission: RE | Admit: 2011-09-13 | Discharge: 2011-09-13 | Disposition: A | Discharge status: Home / Self Care | Payer: Self-pay | Source: Ambulatory Visit | Attending: Cardiology | Admitting: Cardiology

## 2011-09-15 ENCOUNTER — Encounter (HOSPITAL_COMMUNITY)
Admission: RE | Admit: 2011-09-15 | Discharge: 2011-09-15 | Disposition: A | Discharge status: Home / Self Care | Payer: Self-pay | Source: Ambulatory Visit | Attending: Cardiology | Admitting: Cardiology

## 2011-09-20 ENCOUNTER — Encounter (HOSPITAL_COMMUNITY)
Admission: RE | Admit: 2011-09-20 | Discharge: 2011-09-20 | Disposition: A | Discharge status: Home / Self Care | Payer: Self-pay | Source: Ambulatory Visit | Attending: Cardiology | Admitting: Cardiology

## 2011-09-22 ENCOUNTER — Encounter (HOSPITAL_COMMUNITY)
Admission: RE | Admit: 2011-09-22 | Discharge: 2011-09-22 | Disposition: A | Discharge status: Home / Self Care | Payer: Self-pay | Source: Ambulatory Visit | Attending: Cardiology | Admitting: Cardiology

## 2011-09-27 ENCOUNTER — Encounter (HOSPITAL_COMMUNITY)
Admission: RE | Admit: 2011-09-27 | Discharge: 2011-09-27 | Disposition: A | Discharge status: Home / Self Care | Payer: Self-pay | Source: Ambulatory Visit | Attending: Cardiology | Admitting: Cardiology

## 2011-09-29 ENCOUNTER — Encounter (HOSPITAL_COMMUNITY): Payer: Self-pay

## 2011-10-04 ENCOUNTER — Encounter (HOSPITAL_COMMUNITY)
Admission: RE | Admit: 2011-10-04 | Discharge: 2011-10-04 | Disposition: A | Discharge status: Home / Self Care | Payer: Self-pay | Source: Ambulatory Visit | Attending: Cardiology | Admitting: Cardiology

## 2011-10-04 DIAGNOSIS — F17200 Nicotine dependence, unspecified, uncomplicated: Secondary | ICD-10-CM | POA: Insufficient documentation

## 2011-10-04 DIAGNOSIS — R0609 Other forms of dyspnea: Secondary | ICD-10-CM | POA: Insufficient documentation

## 2011-10-04 DIAGNOSIS — R002 Palpitations: Secondary | ICD-10-CM | POA: Insufficient documentation

## 2011-10-04 DIAGNOSIS — R0989 Other specified symptoms and signs involving the circulatory and respiratory systems: Secondary | ICD-10-CM | POA: Insufficient documentation

## 2011-10-04 DIAGNOSIS — Z5189 Encounter for other specified aftercare: Secondary | ICD-10-CM | POA: Insufficient documentation

## 2011-10-06 ENCOUNTER — Encounter (HOSPITAL_COMMUNITY): Payer: Self-pay

## 2011-10-11 ENCOUNTER — Encounter (HOSPITAL_COMMUNITY): Payer: Self-pay

## 2011-10-13 ENCOUNTER — Encounter (HOSPITAL_COMMUNITY)
Admission: RE | Admit: 2011-10-13 | Discharge: 2011-10-13 | Disposition: A | Discharge status: Home / Self Care | Payer: Self-pay | Source: Ambulatory Visit | Attending: Cardiology | Admitting: Cardiology

## 2011-10-18 ENCOUNTER — Encounter (HOSPITAL_COMMUNITY)
Admission: RE | Admit: 2011-10-18 | Discharge: 2011-10-18 | Disposition: A | Discharge status: Home / Self Care | Payer: Self-pay | Source: Ambulatory Visit | Attending: Cardiology | Admitting: Cardiology

## 2011-10-20 ENCOUNTER — Encounter (HOSPITAL_COMMUNITY): Payer: Self-pay

## 2011-10-25 ENCOUNTER — Encounter (HOSPITAL_COMMUNITY)
Admission: RE | Admit: 2011-10-25 | Discharge: 2011-10-25 | Disposition: A | Discharge status: Home / Self Care | Payer: Self-pay | Source: Ambulatory Visit | Attending: Cardiology | Admitting: Cardiology

## 2011-10-27 ENCOUNTER — Encounter (HOSPITAL_COMMUNITY)
Admission: RE | Admit: 2011-10-27 | Discharge: 2011-10-27 | Disposition: A | Discharge status: Home / Self Care | Payer: Self-pay | Source: Ambulatory Visit | Attending: Cardiology | Admitting: Cardiology

## 2011-11-01 ENCOUNTER — Encounter (HOSPITAL_COMMUNITY): Payer: Self-pay

## 2011-11-03 ENCOUNTER — Encounter (HOSPITAL_COMMUNITY)
Admission: RE | Admit: 2011-11-03 | Discharge: 2011-11-03 | Disposition: A | Discharge status: Home / Self Care | Payer: Self-pay | Source: Ambulatory Visit | Attending: Cardiology | Admitting: Cardiology

## 2011-11-03 DIAGNOSIS — R0989 Other specified symptoms and signs involving the circulatory and respiratory systems: Secondary | ICD-10-CM | POA: Insufficient documentation

## 2011-11-03 DIAGNOSIS — R0609 Other forms of dyspnea: Secondary | ICD-10-CM | POA: Insufficient documentation

## 2011-11-03 DIAGNOSIS — F17200 Nicotine dependence, unspecified, uncomplicated: Secondary | ICD-10-CM | POA: Insufficient documentation

## 2011-11-03 DIAGNOSIS — R002 Palpitations: Secondary | ICD-10-CM | POA: Insufficient documentation

## 2011-11-03 DIAGNOSIS — Z5189 Encounter for other specified aftercare: Secondary | ICD-10-CM | POA: Insufficient documentation

## 2011-11-08 ENCOUNTER — Encounter (HOSPITAL_COMMUNITY): Payer: Self-pay

## 2011-11-10 ENCOUNTER — Encounter (HOSPITAL_COMMUNITY)
Admission: RE | Admit: 2011-11-10 | Discharge: 2011-11-10 | Disposition: A | Discharge status: Home / Self Care | Payer: Self-pay | Source: Ambulatory Visit | Attending: Cardiology | Admitting: Cardiology

## 2011-11-15 ENCOUNTER — Encounter (HOSPITAL_COMMUNITY)
Admission: RE | Admit: 2011-11-15 | Discharge: 2011-11-15 | Disposition: A | Discharge status: Home / Self Care | Payer: Self-pay | Source: Ambulatory Visit | Attending: Cardiology | Admitting: Cardiology

## 2011-11-17 ENCOUNTER — Encounter (HOSPITAL_COMMUNITY)
Admission: RE | Admit: 2011-11-17 | Discharge: 2011-11-17 | Disposition: A | Discharge status: Home / Self Care | Payer: Self-pay | Source: Ambulatory Visit | Attending: Cardiology | Admitting: Cardiology

## 2011-11-19 NOTE — Progress Notes (Signed)
Pulmonary Rehabilitation Program Progress Report   Orientation:  07/22/2011 Graduate Date:  tbd Discharge Date:  tbd # of sessions completed: 3  Cardiologist: Rothbart,Robert Family MD:  [facility] Class Time:  13:00  A.  Exercise Program:  Tolerates exercise @ 2.2 METS for 15 minutes  B.  Mental Health:  Good mental attitude  C.  Education/Instruction/Skills  Knows THR for exercise and Uses Perceived Exertion Scale and/or Dyspnea Scale  Uses Perceived Exertion Scale and/or Dyspnea Scale  D.  Nutrition/Weight Control/Body Composition:  Adherence to prescribed nutrition program: fair   *This section completed by Mickle Plumb, Andres Shad, RD, LDN, CDE  E.  Blood Lipids    Lab Results  Component Value Date   CHOL 175 05/11/2011     Lab Results  Component Value Date   TRIG 100 05/11/2011     Lab Results  Component Value Date   HDL 52 05/11/2011     Lab Results  Component Value Date   CHOLHDL 3.4 05/11/2011     No results found for this basename: LDLDIRECT      F.  Lifestyle Changes:  Making positive lifestyle changes  G.  Symptoms noted with exercise:  Asymptomatic  Report Completed By:  Angelica Pou   Comments:  This is patients 1st week report. She achieved a peak Mets of 2.2. Her resting HR is75 and resting BP is 120/70. Her peak HR is105 and her Peak BP is 140/80.  She is motivated to exercise a Halfway report will follow.

## 2011-11-22 ENCOUNTER — Encounter (HOSPITAL_COMMUNITY): Payer: Self-pay

## 2011-11-24 ENCOUNTER — Encounter (HOSPITAL_COMMUNITY): Payer: Self-pay

## 2011-11-25 ENCOUNTER — Ambulatory Visit (INDEPENDENT_AMBULATORY_CARE_PROVIDER_SITE_OTHER): Payer: Self-pay | Admitting: Cardiology

## 2011-11-25 ENCOUNTER — Encounter: Payer: Self-pay | Admitting: Cardiology

## 2011-11-25 DIAGNOSIS — E119 Type 2 diabetes mellitus without complications: Secondary | ICD-10-CM

## 2011-11-25 DIAGNOSIS — N950 Postmenopausal bleeding: Secondary | ICD-10-CM | POA: Insufficient documentation

## 2011-11-25 DIAGNOSIS — E669 Obesity, unspecified: Secondary | ICD-10-CM

## 2011-11-25 NOTE — Patient Instructions (Signed)
Your physician recommends that you schedule a follow-up appointment in: 9 months with Dr Dietrich Pates and 1 month with nurse  Your physician has requested that you regularly monitor and record your blood pressure readings at home. Please use the same machine at the same time of day to check your readings and record them to bring to your follow-up visit.

## 2011-11-25 NOTE — Assessment & Plan Note (Signed)
Patient has been evaluated by gynecology, apparently with a diagnosis of multiple fibroids and plans for hysterectomy.

## 2011-11-25 NOTE — Progress Notes (Signed)
Patient ID: Amber Harris, female   DOB: December 08, 1960, 50 y.o.   MRN: [id number] HPI: Return visit for this very nice woman with hypertension, obesity and multiple symptoms including orthostatic lightheadedness, exertional dyspnea, atypical chest discomfort, headache and postmenopausal bleeding.  She has been evaluated for the latter, found to have fibroids and is to undergo hysterectomy in the near future.  She has occasional nocturnal episodes of chest discomfort that pass spontaneously.  Exercise is limited due to exertional dyspnea.  Over the past 2 weeks she has had daily moderately severe headaches behind the right eye that last for approximately 2 hours.  She denies an aura or associated GI symptoms.  Prior to Admission medications   Medication Sig Start Date End Date Taking? Authorizing Provider  acetaminophen (TYLENOL) 650 MG CR tablet Take 650 mg by mouth every 8 (eight) hours as needed.     Yes Historical Provider, MD  albuterol (PROVENTIL) 90 MCG/ACT inhaler Inhale 2 puffs into the lungs every 6 (six) hours as needed.     Yes Historical Provider, MD  DULoxetine (CYMBALTA) 60 MG capsule Take 60 mg by mouth daily.     Yes Historical Provider, MD  lisinopril-hydrochlorothiazide (PRINZIDE,ZESTORETIC) 20-12.5 MG per tablet Take 1 tablet by mouth 2 (two) times daily.    Yes Historical Provider, MD  megestrol (MEGACE) 40 MG tablet Take 40 mg by mouth daily.     Yes Historical Provider, MD  metFORMIN (GLUMETZA) 500 MG (MOD) 24 hr tablet Take 500 mg by mouth daily with breakfast.     Yes Historical Provider, MD  metoprolol (LOPRESSOR) 50 MG tablet Take 50 mg by mouth daily.     Yes Historical Provider, MD  ranitidine (ZANTAC) 150 MG capsule Take 1 capsule (150 mg total) by mouth 2 (two) times daily. 07/26/11  Yes Gerrit Friends. Gustavus Haskin, MD  traZODone (DESYREL) 100 MG tablet Take 100 mg by mouth at bedtime.    Yes Historical Provider, MD    Allergies  Allergen Reactions  . Aspirin     hives  .  Penicillins     rash  Past medical history, social history, and family history reviewed and updated.  ROS: Notes occasional minimal pedal edema.  She denies syncope.  PHYSICAL EXAM: BP 132/100  Pulse 62  Ht 5\' 7"  (1.702 m)  Wt 134.265 kg (296 lb)  BMI 46.36 kg/m2  General-Well developed; no acute distress Body habitus-obese Neck-No JVD; no carotid bruits Lungs-clear lung fields; resonant to percussion Cardiovascular-normal PMI; normal S1 and S2; minimal basilar systolic murmur Abdomen-normal bowel sounds; soft and non-tender without masses or organomegaly Musculoskeletal-No deformities, no cyanosis or clubbing Neurologic-Normal cranial nerves; symmetric strength and tone Skin-Warm, no significant lesions Extremities-distal pulses intact; trace edema  ASSESSMENT AND PLAN:  Malcolm Bing, MD 11/25/2011 12:33 PM

## 2011-11-25 NOTE — Assessment & Plan Note (Addendum)
BMI in excess of 45 associated with significant obesity-related medical issues including hypertension and diabetes provides adequate indication for bariatric surgery; however, patient does not have insurance coverage or assets that would permit her to undergo this therapy.  She is willing to consider this option in the future should it become feasible from a financial standpoint.  Headache is nonspecific with a negative neurologic exam.  Symptomatic treatment with over-the-counter analgesics is recommended with further evaluation by [facility] should symptoms persist.  She is also complaining of orthostatic lightheadedness, but has no significant change in blood pressure measurements on examination today.  The importance of avoiding loss of consciousness or any fall was explained to her as well as the means to do so.

## 2011-11-25 NOTE — Assessment & Plan Note (Signed)
No hemoglobin A1c levels available; recent laboratory values performed by patient's PCP will be requested.

## 2011-11-29 ENCOUNTER — Encounter (HOSPITAL_COMMUNITY): Payer: Self-pay

## 2011-12-01 ENCOUNTER — Encounter (HOSPITAL_COMMUNITY)
Admission: RE | Admit: 2011-12-01 | Discharge: 2011-12-01 | Disposition: A | Discharge status: Home / Self Care | Payer: Self-pay | Source: Ambulatory Visit | Attending: Cardiology | Admitting: Cardiology

## 2011-12-01 DIAGNOSIS — F17200 Nicotine dependence, unspecified, uncomplicated: Secondary | ICD-10-CM | POA: Insufficient documentation

## 2011-12-01 DIAGNOSIS — R0609 Other forms of dyspnea: Secondary | ICD-10-CM | POA: Insufficient documentation

## 2011-12-01 DIAGNOSIS — R0989 Other specified symptoms and signs involving the circulatory and respiratory systems: Secondary | ICD-10-CM | POA: Insufficient documentation

## 2011-12-01 DIAGNOSIS — Z5189 Encounter for other specified aftercare: Secondary | ICD-10-CM | POA: Insufficient documentation

## 2011-12-01 DIAGNOSIS — R002 Palpitations: Secondary | ICD-10-CM | POA: Insufficient documentation

## 2011-12-02 ENCOUNTER — Encounter: Payer: Self-pay | Admitting: Cardiology

## 2011-12-06 ENCOUNTER — Encounter: Payer: Self-pay | Admitting: *Deleted

## 2011-12-06 ENCOUNTER — Encounter (HOSPITAL_COMMUNITY): Payer: Self-pay

## 2011-12-06 ENCOUNTER — Other Ambulatory Visit: Payer: Self-pay | Admitting: *Deleted

## 2011-12-06 DIAGNOSIS — E782 Mixed hyperlipidemia: Secondary | ICD-10-CM

## 2011-12-07 ENCOUNTER — Encounter (HOSPITAL_COMMUNITY): Payer: Self-pay | Admitting: Pharmacy Technician

## 2011-12-13 NOTE — Progress Notes (Signed)
Pulmonary Rehabilitation Program Progress Report   Orientation:  07/22/2011 Graduate Date:  tbd Discharge Date:  tbd # of sessions completed: 12  Cardiologist: Rothbart,Robert and Pulmonologist: [facility] Family MD:  [facility] Class Time:  13:00  A.  Exercise Program:  Tolerates exercise @ 2.4 METS for 15 minutes  B.  Mental Health:  Good mental attitude  C.  Education/Instruction/Skills  Knows THR for exercise and Uses Perceived Exertion Scale and/or Dyspnea Scale  Uses Perceived Exertion Scale and/or Dyspnea Scale  D.  Nutrition/Weight Control/Body Composition:  Adherence to prescribed nutrition program: good   *This section completed by Mickle Plumb, Andres Shad, RD, LDN, CDE  E.  Blood Lipids    Lab Results  Component Value Date   CHOL 175 05/11/2011     Lab Results  Component Value Date   TRIG 100 05/11/2011     Lab Results  Component Value Date   HDL 52 05/11/2011     Lab Results  Component Value Date   CHOLHDL 3.4 05/11/2011     No results found for this basename: LDLDIRECT      F.  Lifestyle Changes:  Making positive lifestyle changes  G.  Symptoms noted with exercise:  Asymptomatic  Report Completed By:  Angelica Pou   Comments:  This is patients halfway report. She achieved a peak Mets of 2.4. Her resting HR is 70 and her resting BP is 120/70. Her Peak HR is 113 and her peak  BP is 120/72. She is motivated.

## 2011-12-13 NOTE — Progress Notes (Signed)
Pulmonary Rehabilitation Program Progress Report   Orientation:  07/22/2011 Graduate Date:  12/01/2011 Discharge Date:  12/02/2011 # of sessions completed: 23  Cardiologist: [place] Bing Family MD:  [facility] [place] Class Time:  13:00  A.  Exercise Program:  Tolerates exercise @ 2.6 METS for 15 minutes and Discharged to home exercise program.  Anticipated compliance:  excellent  B.  Mental Health:  Good mental attitude  C.  Education/Instruction/Skills  Knows THR for exercise, Uses Perceived Exertion Scale and/or Dyspnea Scale and Attended all education classes  Attended all education classes  D.  Nutrition/Weight Control/Body Composition:  Adherence to prescribed nutrition program: good   *This section completed by Mickle Plumb, Andres Shad, RD, LDN, CDE  E.  Blood Lipids    Lab Results  Component Value Date   CHOL 175 05/11/2011     Lab Results  Component Value Date   TRIG 100 05/11/2011     Lab Results  Component Value Date   HDL 52 05/11/2011     Lab Results  Component Value Date   CHOLHDL 3.4 05/11/2011     No results found for this basename: LDLDIRECT      F.  Lifestyle Changes:  Making positive lifestyle changes  G.  Symptoms noted with exercise:  Asymptomatic  Report Completed By:  Angelica Pou   Comments:  Mrs. Overbay has progressed nicely to 30 minutes of aerobic exercise @ Max Met level of 2.6 and 10 minutes of strength and flexibility exercises. All Patients vital signs are WNL. Patient has met with dietician. DC instructions have been reviewed in detail: Verbalized Understanding. Patient plans to walk at home . Pulmonary staff will call patient at 1 month, 6 months, and at 1 year.

## 2011-12-14 ENCOUNTER — Encounter: Payer: Self-pay | Admitting: *Deleted

## 2011-12-15 ENCOUNTER — Encounter (HOSPITAL_COMMUNITY): Payer: Self-pay

## 2011-12-15 ENCOUNTER — Other Ambulatory Visit: Payer: Self-pay | Admitting: Obstetrics and Gynecology

## 2011-12-15 ENCOUNTER — Encounter (HOSPITAL_COMMUNITY)
Admission: RE | Admit: 2011-12-15 | Discharge: 2011-12-15 | Disposition: A | Discharge status: Home / Self Care | Payer: Self-pay | Source: Ambulatory Visit | Attending: Obstetrics and Gynecology | Admitting: Obstetrics and Gynecology

## 2011-12-15 HISTORY — DX: Depression, unspecified: F32.A

## 2011-12-15 HISTORY — DX: Chronic obstructive pulmonary disease, unspecified: J44.9

## 2011-12-15 HISTORY — DX: Major depressive disorder, single episode, unspecified: F32.9

## 2011-12-15 HISTORY — DX: Headache: R51

## 2011-12-15 LAB — TYPE AND SCREEN
ABO/RH(D): A POS
Antibody Screen: POSITIVE
PT AG Type: NEGATIVE
Unit division: 0

## 2011-12-15 LAB — CBC
MCV: 89.7 fL (ref 78.0–100.0)
Platelets: 220 10*3/uL (ref 150–400)
RDW: 12.8 % (ref 11.5–15.5)
WBC: 6.3 10*3/uL (ref 4.0–10.5)

## 2011-12-15 LAB — ABO/RH: ABO/RH(D): A POS

## 2011-12-15 LAB — SURGICAL PCR SCREEN
MRSA, PCR: NEGATIVE
Staphylococcus aureus: NEGATIVE

## 2011-12-15 LAB — BASIC METABOLIC PANEL
CO2: 23 mEq/L (ref 19–32)
Calcium: 9.7 mg/dL (ref 8.4–10.5)
Creatinine, Ser: 0.72 mg/dL (ref 0.50–1.10)
GFR calc Af Amer: >90 mL/min (ref >90)
GFR calc non Af Amer: >90 mL/min (ref >90)

## 2011-12-15 LAB — HCG, SERUM, QUALITATIVE: Preg, Serum: NEGATIVE

## 2011-12-15 NOTE — Pre-Procedure Instructions (Signed)
Pt viewed video 905. 

## 2011-12-15 NOTE — Patient Instructions (Addendum)
20 Amber Harris  12/15/2011   Your procedure is scheduled on:  12/21/2011  Report to [facility] at  730  AM.  Call this number if you have problems the morning of surgery: [phone]   Remember:   Do not eat food:After Midnight.  May have clear liquids:until Midnight .  Clear liquids include soda, tea, black coffee, apple or grape juice, broth.  Take these medicines the morning of surgery with A SIP OF WATER: zantac,cymbalta,lisinopril,lopresor. Take albuterol inhaler before you come.   Do not wear jewelry, make-up or nail polish.  Do not wear lotions, powders, or perfumes. You may wear deodorant.  Do not shave 48 hours prior to surgery.  Do not bring valuables to the hospital.  Contacts, dentures or bridgework may not be worn into surgery.  Leave suitcase in the car. After surgery it may be brought to your room.  For patients admitted to the hospital, checkout time is 11:00 AM the day of discharge.   Patients discharged the day of surgery will not be allowed to drive home.  Name and phone number of your driver: family  Special Instructions: CHG Shower Use Special Wash: 1/2 bottle night before surgery and 1/2 bottle morning of surgery.   Please read over the following fact sheets that you were given: Pain Booklet, MRSA Information, Surgical Site Infection Prevention, Anesthesia Post-op Instructions and Care and Recovery After Surgery Hysterectomy A hysterectomy is a procedure where your womb (uterus) is surgically taken out. It will no longer be possible to have menstrual periods or to become pregnant. Removal of the tubes and ovaries (bilateral salpingo-oopherectomy) can be done during this operation as well.   An abdominal hysterectomy is done through a large cut (incision) in the abdomen made by the surgeon.   A vaginal hysterectomy is done through the vagina. There are no abdominal incisions, but there will be incisions on the inside of the vagina.   A laparoscopic assisted  vaginal hysterectomy is done through 2 or 3 small incisions in the abdomen, but the uterus is removed and passed through the vagina.  Women who are going to have a hysterectomy should be tested first to make sure there is no cancer of the cervix or in the uterus. INDICATIONS FOR HYSTERECTOMY:  Persistent abnormal bleeding.   Lasting (chronic) pelvic pain.   Endometriosis. This is when the lining of the uterus (endometrium) is misplaced outside of its normal location.   Adenomyosis. This is when the endometrium tissue grows in the muscle of the uterus.   Uterine prolapse. This is when the uterus falls down into the vagina.   Cancer of the uterus or cervix that requires a radical hysterectomy, removal of the uterus, tubes, ovaries, and surrounding lymph nodes.  LET YOUR CAREGIVER KNOW ABOUT:  Allergies (especially to medicines).   Medications taken including herbs, eye drops, over the counter medications, and creams.   Use of steroids (by mouth or creams).   Past problems with anesthetics or numbing medication.   Possibility of pregnancy, if this applies.   History of blood clots (thrombophlebitis).   History of bleeding or blood problems.   Past surgery.   Other health problems.  RISKS AND COMPLICATIONS All surgeries can have risks. Some of these risks are:  A lot of bleeding.   Injury to surrounding organs.   Infection.   Blood clots of the leg, heart, or lung.   Problems with anesthesia.   Early menopause.  BEFORE THE PROCEDURE  Do  not take aspirin or blood thinners for a week before surgery, or as directed by your caregiver.   Do not eat or drink anything after midnight the night before surgery, or as directed by your caregiver.   Let your caregiver know if you get a cold or other infectious problems before surgery.   If you are being admitted the day of surgery, you should be present 60 minutes before your procedure or as told by your caregiver.  PROCEDURE     An IV (intravenous) will be placed in your arm. You will be given a drug to make you sleep (anesthetic) during surgery. You may be given a shot in the spine (spinal anesthesia) that will numb your body from the waist down. This will keep you pain-free during surgery.   When you wake from surgery, you will have the IV and a long, narrow, hollow tube (urinary catheter) draining the bladder for 1 or 2 days after surgery. This will make passing your urine easier. It also helps by keeping your bladder empty during surgery.   After surgery, you will be taken to the recovery area where a nurse will watch and check your progress. Once you wake up, stable and taking fluids well, without other problems, you will be allowed to return to your room. Usually you will remain in the hospital 3 to 5 days. You may be given an antibiotic during and after the surgery and when you go home. Pain medication will be ordered by your caregiver while you are in the hospital and when you go home.  HOME CARE INSTRUCTIONS  Healing will take time. You will have discomfort, tenderness, swelling, and bruising at the operative site for a couple of weeks. This is normal and will get better as time goes on.   Only take over-the-counter or prescription medicines for pain, discomfort, or fever as directed by your caregiver.   Do not take aspirin. It can cause bleeding.   Do not drive when taking pain medication.   Follow your caregiver's advice regarding diet, exercise, lifting, driving, and general activities.   Resume your usual diet as directed and allowed.   Get plenty of rest and sleep.   Do not douche, use tampons, or have sexual intercourse until your caregiver gives you permission.   Change your bandages (dressings) as directed.   Take your temperature twice a day. Write it down.   Your caregiver may recommend showers instead of baths for a few weeks.   Do not drink alcohol until your caregiver gives you  permission.   If you develop constipation, you may take a mild laxative with your caregiver's permission. Bran foods and drinking fluids helps with constipation problems.   Try to have someone home with you for a week or two to help with the household activities.   Make sure you and your family understands everything about your operation and recovery.   Do not sign any legal documents until you feel normal again.   Keep all your follow-up appointments as recommended by your caregiver.  SEEK MEDICAL CARE IF:   There is swelling, redness, or increasing pain in the wound area.   Pus is coming from the wound.   You notice a bad smell from the wound or surgical dressing.   You have pain, redness, and swelling from the intravenous site.   The wound is breaking open (the edges are not staying together).   You feel dizzy or feel like fainting.   You  develop pain or bleeding when you urinate.   You develop diarrhea.   You develop nausea and vomiting.   You develop abnormal vaginal discharge.   You develop a rash.   You have any type of abnormal reaction or develop an allergy to your medication.   You need stronger pain medication for your pain.  SEEK IMMEDIATE MEDICAL CARE IF:  You have a fever.   You develop abdominal pain.   You develop chest pain.   You develop shortness of breath.   You pass out.   You develop pain, swelling, or redness of your leg.   You develop heavy vaginal bleeding with or without blood clots.  Document Released: 05/11/2001 Document Revised: 07/28/2011 Document Reviewed: 03/28/2008 [facility] Patient Information 2012 [place], [place].PATIENT INSTRUCTIONS POST-ANESTHESIA  IMMEDIATELY FOLLOWING SURGERY:  Do not drive or operate machinery for the first twenty four hours after surgery.  Do not make any important decisions for twenty four hours after surgery or while taking narcotic pain medications or sedatives.  If you develop intractable nausea and  vomiting or a severe headache please notify your doctor immediately.  FOLLOW-UP:  Please make an appointment with your surgeon as instructed. You do not need to follow up with anesthesia unless specifically instructed to do so.  WOUND CARE INSTRUCTIONS (if applicable):  Keep a dry clean dressing on the anesthesia/puncture wound site if there is drainage.  Once the wound has quit draining you may leave it open to air.  Generally you should leave the bandage intact for twenty four hours unless there is drainage.  If the epidural site drains for more than 36-48 hours please call the anesthesia department.  QUESTIONS?:  Please feel free to call your physician or the hospital operator if you have any questions, and they will be happy to assist you.     [facility] Anesthesia Department [address] [place] [phone]

## 2011-12-15 NOTE — H&P (Signed)
Amber Harris is an 51 y.o. female. She is admitted for with removal of cervix. She has had symptomatic pressure due to the fibroids for years. [facility] included an ultrasound which was normal and approximately 300 g uterus with fibroids. She has been on Lupron with add back Aygestin 5 mg daily and this has resulted in some slight improvement in her discomfort and likely reduced size of the fibroid. She ha had endometrial biopsy that was benign.  Pertinent Gynecological History: Menses: Light flow while on Megace Bleeding: dysfunctional uterine bleeding while on Lupron resulting in use of Megace Contraception: abstinence and And hormonal with Lupron DES exposure: denies Blood transfusions: none Sexually transmitted diseases: no past history Previous GYN Procedures: Endometrial biopsy benign October 2012  Last mammogram: normal Date:  Last pap: normal Date: 2012 OB History: G3, P4   Menstrual History: Menarche age:  No LMP recorded. irregular bleeding on Lupron last normal period 10/25/2011    Past Medical History  Diagnosis Date  . Syncope     exertional  . Diabetes mellitus   . Hypertension   . Obesity   . Asthma     Uses p.r.n. albuterol  . Tobacco user     30 pack years; 04/2011: 1/4 pack per day during quick attempt  . Aortic valve disease     Long-standing systolic murmur  . Dyspnea on exertion     poor exercise tolerance  . Palpitations   . Depression with anxiety   . Fibroids     uterine; postmenopausal bleeding  . Degenerative joint disease     right knee    Past Surgical History  Procedure Date  . Tubal ligation     Family History  Problem Relation Age of Onset  . Lung cancer Mother   . Heart disease Brother   . Breast cancer Sister   . Heart disease Maternal Aunt     Social History:  reports that she has been smoking Cigarettes.  She has been smoking about .5 packs per day. She has never used smokeless  tobacco. She reports that she does not drink alcohol or use illicit drugs.  Allergies:  Allergies  Allergen Reactions  . Aspirin Other (See Comments)    hives  . Penicillins Other (See Comments)    rash     (Not in a hospital admission)  ROS  There were no vitals taken for this visit. Physical ExamPhysical Examination: General appearance - alert, well appearing, and in no distress, oriented to person, place, and time and overweight Mental status - alert, oriented to person, place, and time, normal mood, behavior, speech, dress, motor activity, and thought processes Neck - supple, no significant adenopathy Chest - clear to auscultation, no wheezes, rales or rhonchi, symmetric air entry Heart - normal rate and regular rhythm Abdomen - obesity with large panniculus from umbilicus to symphysis pubis no incision scars or hernias noted there is some fullness in the right lower quadrant associated with fibroids Pelvic - VULVA: normal appearing vulva with no masses, tenderness or lesions, VAGINA: normal appearing vagina with normal color and discharge, no lesions, CERVIX: normal appearing cervix without discharge or lesions, WET MOUNT done - results: DNA probe for chlamydia and GC obtained, DNA probe for chlamydia and GC obtained, UTERUS: enlarged to 14 week's size, ADNEXA: Fullness extends into the right adnexa where the fibroids have been noted by ultrasound in the past, exam limited by patient's morbid obesity Extremities -   peripheral pulses normal, no pedal edema, no clubbing or cyanosis, Homan's sign negative bilaterally No results found for this or any previous visit (from the past 24 hour(s)).  No results found.  Assessment Uterine fibroids in week size suppressed with Lupron x3 months   diabetes mellitus chronic hypertension morbid obesity Plan bowel prep followed by abdominal hysterectomy next Tuesday, 10/20/2012 likely through a midline incision risks of procedure been reviewed with  patient occluding bleeding infection wound healing difficulty another potential complications plans are for ovarian preservation:   Reiley Keisler V 12/15/2011, 11:04 AM  

## 2011-12-16 LAB — HEMOGLOBIN A1C
Hgb A1c MFr Bld: 6.8 % — ABNORMAL HIGH (ref <5.7)
Mean Plasma Glucose: 148 mg/dL — ABNORMAL HIGH (ref <117)

## 2011-12-21 ENCOUNTER — Inpatient Hospital Stay (HOSPITAL_COMMUNITY): Payer: Self-pay | Admitting: Anesthesiology

## 2011-12-21 ENCOUNTER — Inpatient Hospital Stay (HOSPITAL_COMMUNITY)
Admission: RE | Admit: 2011-12-21 | Discharge: 2011-12-23 | DRG: 743 | Disposition: A | Discharge status: Home / Self Care | Payer: Self-pay | Source: Ambulatory Visit | Attending: Obstetrics and Gynecology | Admitting: Obstetrics and Gynecology

## 2011-12-21 ENCOUNTER — Encounter (HOSPITAL_COMMUNITY): Payer: Self-pay | Admitting: Anesthesiology

## 2011-12-21 ENCOUNTER — Encounter (HOSPITAL_COMMUNITY)
Admission: RE | Disposition: A | Discharge status: Home / Self Care | Payer: Self-pay | Source: Ambulatory Visit | Attending: Obstetrics and Gynecology

## 2011-12-21 ENCOUNTER — Other Ambulatory Visit: Payer: Self-pay | Admitting: Obstetrics and Gynecology

## 2011-12-21 ENCOUNTER — Encounter (HOSPITAL_COMMUNITY): Payer: Self-pay

## 2011-12-21 DIAGNOSIS — E119 Type 2 diabetes mellitus without complications: Secondary | ICD-10-CM | POA: Diagnosis present

## 2011-12-21 DIAGNOSIS — J449 Chronic obstructive pulmonary disease, unspecified: Secondary | ICD-10-CM | POA: Diagnosis present

## 2011-12-21 DIAGNOSIS — D219 Benign neoplasm of connective and other soft tissue, unspecified: Secondary | ICD-10-CM | POA: Diagnosis present

## 2011-12-21 DIAGNOSIS — D259 Leiomyoma of uterus, unspecified: Principal | ICD-10-CM | POA: Diagnosis present

## 2011-12-21 DIAGNOSIS — K219 Gastro-esophageal reflux disease without esophagitis: Secondary | ICD-10-CM | POA: Diagnosis present

## 2011-12-21 DIAGNOSIS — J4489 Other specified chronic obstructive pulmonary disease: Secondary | ICD-10-CM | POA: Diagnosis present

## 2011-12-21 DIAGNOSIS — I10 Essential (primary) hypertension: Secondary | ICD-10-CM | POA: Diagnosis present

## 2011-12-21 DIAGNOSIS — Z01812 Encounter for preprocedural laboratory examination: Secondary | ICD-10-CM

## 2011-12-21 DIAGNOSIS — M1710 Unilateral primary osteoarthritis, unspecified knee: Secondary | ICD-10-CM | POA: Diagnosis present

## 2011-12-21 DIAGNOSIS — F17200 Nicotine dependence, unspecified, uncomplicated: Secondary | ICD-10-CM | POA: Diagnosis present

## 2011-12-21 DIAGNOSIS — I359 Nonrheumatic aortic valve disorder, unspecified: Secondary | ICD-10-CM | POA: Diagnosis present

## 2011-12-21 DIAGNOSIS — Z23 Encounter for immunization: Secondary | ICD-10-CM

## 2011-12-21 DIAGNOSIS — F341 Dysthymic disorder: Secondary | ICD-10-CM | POA: Diagnosis present

## 2011-12-21 HISTORY — PX: ABDOMINAL HYSTERECTOMY: SHX81

## 2011-12-21 SURGERY — HYSTERECTOMY, ABDOMINAL
Anesthesia: General | Site: Abdomen | Wound class: Clean Contaminated

## 2011-12-21 MED ORDER — GENTAMICIN IN SALINE 1.6-0.9 MG/ML-% IV SOLN
INTRAVENOUS | Status: DC | PRN
  Administered 2011-12-21: 100 mg via INTRAVENOUS

## 2011-12-21 MED ORDER — MIDAZOLAM HCL 2 MG/2ML IJ SOLN
1.0000 mg | INTRAMUSCULAR | Status: DC | PRN
  Administered 2011-12-21: 2 mg via INTRAVENOUS

## 2011-12-21 MED ORDER — SODIUM CHLORIDE 0.9 % IJ SOLN: 9.0000 mL | INTRAMUSCULAR | Status: DC | PRN

## 2011-12-21 MED ORDER — FENTANYL CITRATE 0.05 MG/ML IJ SOLN: 25.0000 ug | INTRAMUSCULAR | Status: DC | PRN

## 2011-12-21 MED ORDER — GLYCOPYRROLATE 0.2 MG/ML IJ SOLN
INTRAMUSCULAR | Status: AC
  Filled 2011-12-21: qty 2

## 2011-12-21 MED ORDER — 0.9 % SODIUM CHLORIDE (POUR BTL) OPTIME
TOPICAL | Status: DC | PRN
  Administered 2011-12-21: 2000 mL

## 2011-12-21 MED ORDER — HYDROMORPHONE HCL PF 1 MG/ML IJ SOLN
INTRAMUSCULAR | Status: AC
  Administered 2011-12-21: 1 mg via INTRAVENOUS
  Filled 2011-12-21: qty 1

## 2011-12-21 MED ORDER — ONDANSETRON HCL 4 MG/2ML IJ SOLN: 4.0000 mg | Freq: Four times a day (QID) | INTRAMUSCULAR | Status: DC | PRN

## 2011-12-21 MED ORDER — MIDAZOLAM HCL 2 MG/2ML IJ SOLN
INTRAMUSCULAR | Status: AC
  Administered 2011-12-21: 2 mg via INTRAVENOUS
  Filled 2011-12-21: qty 2

## 2011-12-21 MED ORDER — LISINOPRIL-HYDROCHLOROTHIAZIDE 20-12.5 MG PO TABS: 1.0000 | ORAL_TABLET | Freq: Two times a day (BID) | ORAL | Status: DC

## 2011-12-21 MED ORDER — OXYCODONE-ACETAMINOPHEN 5-325 MG PO TABS
1.0000 | ORAL_TABLET | ORAL | Status: DC | PRN
Start: 2011-12-21 — End: 2011-12-23
  Administered 2011-12-22 (×2): 1 via ORAL
  Filled 2011-12-21 (×2): qty 1

## 2011-12-21 MED ORDER — HYDROMORPHONE 0.3 MG/ML IV SOLN
INTRAVENOUS | Status: AC
  Administered 2011-12-21: 0.3 mg
  Filled 2011-12-21: qty 25

## 2011-12-21 MED ORDER — CLINDAMYCIN PHOSPHATE 900 MG/50ML IV SOLN
INTRAVENOUS | Status: AC
  Filled 2011-12-21: qty 50

## 2011-12-21 MED ORDER — ROCURONIUM BROMIDE 100 MG/10ML IV SOLN
INTRAVENOUS | Status: DC | PRN
  Administered 2011-12-21 (×2): 10 mg via INTRAVENOUS
  Administered 2011-12-21: 40 mg via INTRAVENOUS

## 2011-12-21 MED ORDER — FENTANYL CITRATE 0.05 MG/ML IJ SOLN
INTRAMUSCULAR | Status: AC
  Administered 2011-12-21: 50 ug via INTRAVENOUS
  Filled 2011-12-21: qty 2

## 2011-12-21 MED ORDER — POTASSIUM CHLORIDE IN NACL 20-0.9 MEQ/L-% IV SOLN
INTRAVENOUS | Status: DC
  Administered 2011-12-21 – 2011-12-22 (×2): via INTRAVENOUS

## 2011-12-21 MED ORDER — NEOSTIGMINE METHYLSULFATE 1 MG/ML IJ SOLN
INTRAMUSCULAR | Status: AC
  Filled 2011-12-21: qty 10

## 2011-12-21 MED ORDER — LACTATED RINGERS IV SOLN
INTRAVENOUS | Status: DC
  Administered 2011-12-21 (×3): via INTRAVENOUS

## 2011-12-21 MED ORDER — PROPOFOL 10 MG/ML IV BOLUS
INTRAVENOUS | Status: DC | PRN
  Administered 2011-12-21: 200 mg via INTRAVENOUS

## 2011-12-21 MED ORDER — NEOSTIGMINE METHYLSULFATE 1 MG/ML IJ SOLN
INTRAMUSCULAR | Status: DC | PRN
  Administered 2011-12-21: 4 mg via INTRAVENOUS

## 2011-12-21 MED ORDER — SIMETHICONE 80 MG PO CHEW: 80.0000 mg | CHEWABLE_TABLET | Freq: Four times a day (QID) | ORAL | Status: DC | PRN

## 2011-12-21 MED ORDER — FENTANYL CITRATE 0.05 MG/ML IJ SOLN
INTRAMUSCULAR | Status: AC
  Filled 2011-12-21: qty 5

## 2011-12-21 MED ORDER — DIPHENHYDRAMINE HCL 50 MG/ML IJ SOLN: 12.5000 mg | Freq: Four times a day (QID) | INTRAMUSCULAR | Status: DC | PRN

## 2011-12-21 MED ORDER — LISINOPRIL 10 MG PO TABS
20.0000 mg | ORAL_TABLET | Freq: Every day | ORAL | Status: DC
  Administered 2011-12-23: 20 mg via ORAL
  Filled 2011-12-21 (×2): qty 2

## 2011-12-21 MED ORDER — GENTAMICIN SULFATE 40 MG/ML IJ SOLN: INTRAVENOUS | Status: DC

## 2011-12-21 MED ORDER — LIDOCAINE HCL 1 % IJ SOLN
INTRAMUSCULAR | Status: DC | PRN
  Administered 2011-12-21: 50 mg via INTRADERMAL

## 2011-12-21 MED ORDER — ROCURONIUM BROMIDE 50 MG/5ML IV SOLN
INTRAVENOUS | Status: AC
  Filled 2011-12-21: qty 1

## 2011-12-21 MED ORDER — GENTAMICIN SULFATE 40 MG/ML IJ SOLN
100.0000 mg | Freq: Once | INTRAVENOUS | Status: DC
  Filled 2011-12-21: qty 2.5

## 2011-12-21 MED ORDER — GLYCOPYRROLATE 0.2 MG/ML IJ SOLN
INTRAMUSCULAR | Status: DC | PRN
  Administered 2011-12-21: .8 mg via INTRAVENOUS

## 2011-12-21 MED ORDER — METOPROLOL TARTRATE 25 MG PO TABS
50.0000 mg | ORAL_TABLET | Freq: Two times a day (BID) | ORAL | Status: DC
  Administered 2011-12-22 – 2011-12-23 (×2): 50 mg via ORAL
  Filled 2011-12-21 (×3): qty 2

## 2011-12-21 MED ORDER — FENTANYL CITRATE 0.05 MG/ML IJ SOLN
INTRAMUSCULAR | Status: AC
  Filled 2011-12-21: qty 2

## 2011-12-21 MED ORDER — DOCUSATE SODIUM 100 MG PO CAPS
100.0000 mg | ORAL_CAPSULE | Freq: Two times a day (BID) | ORAL | Status: DC
Start: 2011-12-22 — End: 2011-12-23
  Administered 2011-12-22 – 2011-12-23 (×3): 100 mg via ORAL
  Filled 2011-12-21 (×3): qty 1

## 2011-12-21 MED ORDER — LIDOCAINE HCL (PF) 1 % IJ SOLN
INTRAMUSCULAR | Status: AC
  Filled 2011-12-21: qty 5

## 2011-12-21 MED ORDER — HYDROMORPHONE 0.3 MG/ML IV SOLN
INTRAVENOUS | Status: DC
Start: 2011-12-21 — End: 2011-12-22
  Administered 2011-12-22: 0.6 mg via INTRAVENOUS
  Administered 2011-12-22: 1.8 mg via INTRAVENOUS

## 2011-12-21 MED ORDER — HYDROMORPHONE HCL PF 1 MG/ML IJ SOLN: 1.0000 mg | Freq: Once | INTRAMUSCULAR | Status: DC

## 2011-12-21 MED ORDER — METFORMIN HCL ER 500 MG PO TB24
500.0000 mg | ORAL_TABLET | Freq: Every day | ORAL | Status: DC
  Administered 2011-12-22 – 2011-12-23 (×2): 500 mg via ORAL
  Filled 2011-12-21 (×3): qty 1

## 2011-12-21 MED ORDER — HYDROMORPHONE BOLUS VIA INFUSION: 1.0000 mg | Freq: Once | INTRAVENOUS | Status: DC

## 2011-12-21 MED ORDER — ONDANSETRON HCL 4 MG/2ML IJ SOLN
4.0000 mg | Freq: Once | INTRAMUSCULAR | Status: AC | PRN
  Administered 2011-12-21: 4 mg via INTRAVENOUS

## 2011-12-21 MED ORDER — CLINDAMYCIN PHOSPHATE 600 MG/50ML IV SOLN
INTRAVENOUS | Status: DC | PRN
  Administered 2011-12-21: 900 mg via INTRAVENOUS

## 2011-12-21 MED ORDER — DIPHENHYDRAMINE HCL 12.5 MG/5ML PO ELIX
12.5000 mg | ORAL_SOLUTION | Freq: Four times a day (QID) | ORAL | Status: DC | PRN
  Administered 2011-12-22: 12.5 mg via ORAL
  Filled 2011-12-21: qty 5

## 2011-12-21 MED ORDER — HYDROCHLOROTHIAZIDE 12.5 MG PO CAPS
12.5000 mg | ORAL_CAPSULE | Freq: Two times a day (BID) | ORAL | Status: DC
  Administered 2011-12-22 – 2011-12-23 (×3): 12.5 mg via ORAL
  Filled 2011-12-21 (×3): qty 1

## 2011-12-21 MED ORDER — FENTANYL CITRATE 0.05 MG/ML IJ SOLN
INTRAMUSCULAR | Status: DC | PRN
  Administered 2011-12-21 (×9): 50 ug via INTRAVENOUS

## 2011-12-21 MED ORDER — PROMETHAZINE HCL 25 MG/ML IJ SOLN: 12.5000 mg | INTRAMUSCULAR | Status: DC | PRN

## 2011-12-21 MED ORDER — PANTOPRAZOLE SODIUM 40 MG IV SOLR
40.0000 mg | Freq: Every day | INTRAVENOUS | Status: DC
  Administered 2011-12-21 – 2011-12-22 (×2): 40 mg via INTRAVENOUS
  Filled 2011-12-21 (×2): qty 40

## 2011-12-21 MED ORDER — PROPOFOL 10 MG/ML IV EMUL
INTRAVENOUS | Status: AC
  Filled 2011-12-21: qty 20

## 2011-12-21 MED ORDER — ALBUTEROL 90 MCG/ACT IN AERS
2.0000 | INHALATION_SPRAY | Freq: Four times a day (QID) | RESPIRATORY_TRACT | Status: DC | PRN
  Filled 2011-12-21: qty 2

## 2011-12-21 MED ORDER — ZOLPIDEM TARTRATE 5 MG PO TABS
5.0000 mg | ORAL_TABLET | Freq: Every evening | ORAL | Status: DC | PRN
  Administered 2011-12-22: 5 mg via ORAL
  Filled 2011-12-21: qty 1

## 2011-12-21 MED ORDER — FENTANYL CITRATE 0.05 MG/ML IJ SOLN
25.0000 ug | INTRAMUSCULAR | Status: DC | PRN
  Administered 2011-12-21 (×2): 50 ug via INTRAVENOUS

## 2011-12-21 MED ORDER — ONDANSETRON HCL 4 MG/2ML IJ SOLN: 4.0000 mg | Freq: Once | INTRAMUSCULAR | Status: DC | PRN

## 2011-12-21 MED ORDER — ONDANSETRON HCL 4 MG/2ML IJ SOLN
INTRAMUSCULAR | Status: AC
  Administered 2011-12-21: 4 mg via INTRAVENOUS
  Filled 2011-12-21: qty 2

## 2011-12-21 MED ORDER — NALOXONE HCL 0.4 MG/ML IJ SOLN: 0.4000 mg | INTRAMUSCULAR | Status: DC | PRN

## 2011-12-21 MED ORDER — PEG 3350-KCL-NABCB-NACL-NASULF 236 G PO SOLR
4000.0000 mL | Freq: Once | ORAL | Status: DC
  Filled 2011-12-21: qty 4000

## 2011-12-21 MED ORDER — DULOXETINE HCL 60 MG PO CPEP
60.0000 mg | ORAL_CAPSULE | Freq: Every day | ORAL | Status: DC
  Administered 2011-12-22 – 2011-12-23 (×2): 60 mg via ORAL
  Filled 2011-12-21 (×2): qty 1

## 2011-12-21 SURGICAL SUPPLY — 68 items
APL SKNCLS STERI-STRIP NONHPOA (GAUZE/BANDAGES/DRESSINGS) ×1
APPLIER CLIP 11 MED OPEN (CLIP)
APPLIER CLIP 13 LRG OPEN (CLIP)
APR CLP LRG 13 20 CLIP (CLIP)
APR CLP MED 11 20 MLT OPN (CLIP)
BAG HAMPER (MISCELLANEOUS) ×2 IMPLANT
BENZOIN TINCTURE PRP APPL 2/3 (GAUZE/BANDAGES/DRESSINGS) ×1 IMPLANT
BINDER ABD UNIV 9 30-45 (GAUZE/BANDAGES/DRESSINGS) IMPLANT
BINDER ABDOMINAL 9 (GAUZE/BANDAGES/DRESSINGS) ×2
BRR ADH 6X5 SEPRAFILM 1 SHT (MISCELLANEOUS)
CELLS DAT CNTRL 66122 CELL SVR (MISCELLANEOUS) IMPLANT
CLIP APPLIE 11 MED OPEN (CLIP) IMPLANT
CLIP APPLIE 13 LRG OPEN (CLIP) IMPLANT
CLOTH BEACON ORANGE TIMEOUT ST (SAFETY) ×2 IMPLANT
COVER LIGHT HANDLE STERIS (MISCELLANEOUS) ×4 IMPLANT
DRAPE WARM FLUID 44X44 (DRAPE) ×2 IMPLANT
DRESSING TELFA 8X3 (GAUZE/BANDAGES/DRESSINGS) ×2 IMPLANT
ELECT BLADE 6 FLAT ULTRCLN (ELECTRODE) ×1 IMPLANT
ELECT REM PT RETURN 9FT ADLT (ELECTROSURGICAL) ×2
ELECTRODE REM PT RTRN 9FT ADLT (ELECTROSURGICAL) ×1 IMPLANT
EVACUATOR DRAINAGE 10X20 100CC (DRAIN) IMPLANT
EVACUATOR SILICONE 100CC (DRAIN)
FORMALIN 10 PREFIL 480ML (MISCELLANEOUS) ×1 IMPLANT
GLOVE BIOGEL PI IND STRL 6.5 (GLOVE) IMPLANT
GLOVE BIOGEL PI INDICATOR 6.5 (GLOVE) ×1
GLOVE ECLIPSE 6.5 STRL STRAW (GLOVE) ×2 IMPLANT
GLOVE ECLIPSE 7.0 STRL STRAW (GLOVE) ×1 IMPLANT
GLOVE ECLIPSE 9.0 STRL (GLOVE) ×2 IMPLANT
GLOVE INDICATOR 6.5 STRL GRN (GLOVE) ×1 IMPLANT
GLOVE INDICATOR 7.0 STRL GRN (GLOVE) ×2 IMPLANT
GLOVE INDICATOR 7.5 STRL GRN (GLOVE) ×1 IMPLANT
GLOVE INDICATOR STER SZ 9 (GLOVE) ×2 IMPLANT
GOWN STRL REIN 3XL LVL4 (GOWN DISPOSABLE) ×2 IMPLANT
GOWN STRL REIN XL XLG (GOWN DISPOSABLE) ×4 IMPLANT
INST SET MAJOR GENERAL (KITS) ×2 IMPLANT
KIT ROOM TURNOVER APOR (KITS) ×2 IMPLANT
MANIFOLD NEPTUNE II (INSTRUMENTS) ×2 IMPLANT
NS IRRIG 1000ML POUR BTL (IV SOLUTION) ×4 IMPLANT
PACK ABDOMINAL MAJOR (CUSTOM PROCEDURE TRAY) ×2 IMPLANT
RETRACTOR WND ALEXIS 18 MED (MISCELLANEOUS) IMPLANT
RETRACTOR WND ALEXIS 25 LRG (MISCELLANEOUS) IMPLANT
RTRCTR WOUND ALEXIS 18CM MED (MISCELLANEOUS)
RTRCTR WOUND ALEXIS 25CM LRG (MISCELLANEOUS)
SEPRAFILM MEMBRANE 5X6 (MISCELLANEOUS) IMPLANT
SET BASIN LINEN APH (SET/KITS/TRAYS/PACK) ×2 IMPLANT
SOL PREP PROV IODINE SCRUB 4OZ (MISCELLANEOUS) ×2 IMPLANT
SPONGE DRAIN TRACH 4X4 STRL 2S (GAUZE/BANDAGES/DRESSINGS) IMPLANT
SPONGE GAUZE 4X4 12PLY (GAUZE/BANDAGES/DRESSINGS) ×2 IMPLANT
SPONGE LAP 18X18 X RAY DECT (DISPOSABLE) ×1 IMPLANT
STAPLER VISISTAT 35W (STAPLE) IMPLANT
STRIP CLOSURE SKIN 1/2X4 (GAUZE/BANDAGES/DRESSINGS) ×3 IMPLANT
SUT CHROMIC 0 CT 1 (SUTURE) ×32 IMPLANT
SUT CHROMIC 2 0 CT 1 (SUTURE) ×3 IMPLANT
SUT CHROMIC GUT BROWN 0 54 (SUTURE) IMPLANT
SUT CHROMIC GUT BROWN 0 54IN (SUTURE)
SUT ETHILON 3 0 FSL (SUTURE) IMPLANT
SUT PDS AB CT VIOLET #0 27IN (SUTURE) ×2 IMPLANT
SUT PLAIN CT 1/2CIR 2-0 27IN (SUTURE) ×3 IMPLANT
SUT PROLENE 0 CT 1 30 (SUTURE) IMPLANT
SUT VIC AB 0 CT1 27 (SUTURE) ×2
SUT VIC AB 0 CT1 27XBRD ANTBC (SUTURE) IMPLANT
SUT VIC AB 2-0 CT1 27 (SUTURE)
SUT VIC AB 2-0 CT1 TAPERPNT 27 (SUTURE) IMPLANT
SUT VICRYL 3 0 (SUTURE) ×1 IMPLANT
SUT VICRYL 4 0 KS 27 (SUTURE) ×1 IMPLANT
TOWEL BLUE STERILE X RAY DET (MISCELLANEOUS) ×2 IMPLANT
TOWEL OR 17X26 4PK STRL BLUE (TOWEL DISPOSABLE) ×1 IMPLANT
TRAY FOLEY CATH 14FR (SET/KITS/TRAYS/PACK) ×2 IMPLANT

## 2011-12-21 NOTE — Anesthesia Procedure Notes (Signed)
Procedure Name: Intubation Date/Time: 12/21/2011 8:46 AM Performed by: Glynn Octave Pre-anesthesia Checklist: Patient identified, Patient being monitored, Timeout performed, Emergency Drugs available and Suction available Patient Re-evaluated:Patient Re-evaluated prior to inductionOxygen Delivery Method: Circle System Utilized Preoxygenation: Pre-oxygenation with 100% oxygen Intubation Type: IV induction, Rapid sequence and Cricoid Pressure applied Ventilation: Mask ventilation without difficulty Laryngoscope Size: Mac and 3 Grade View: Grade II Tube type: Oral Tube size: 7.0 mm Number of attempts: 1 Airway Equipment and Method: stylet Placement Confirmation: ETT inserted through vocal cords under direct vision,  positive ETCO2 and breath sounds checked- equal and bilateral Secured at: 21 cm Tube secured with: Tape Dental Injury: Teeth and Oropharynx as per pre-operative assessment

## 2011-12-21 NOTE — Anesthesia Preprocedure Evaluation (Addendum)
Anesthesia Evaluation  Patient identified by MRN, date of birth, ID band Patient awake    Reviewed: Allergy & Precautions, H&P , NPO status , Patient's Chart, lab work & pertinent test results  History of Anesthesia Complications Negative for: history of anesthetic complications  Airway Mallampati: III TM Distance: >3 FB     Dental  (+) Teeth Intact   Pulmonary shortness of breath and with exertion, asthma , COPD COPD inhaler,  clear to auscultation        Cardiovascular hypertension, Pt. on medications + DOE Regular Normal    Neuro/Psych  Headaches, PSYCHIATRIC DISORDERS Depression    GI/Hepatic GERD-  Medicated and Controlled,  Endo/Other  Diabetes mellitus-, Well Controlled, Type 2, Oral Hypoglycemic Agents  Renal/GU      Musculoskeletal   Abdominal (+) obese,   Peds  Hematology   Anesthesia Other Findings   Reproductive/Obstetrics                           Anesthesia Physical Anesthesia Plan  ASA: III  Anesthesia Plan: General   Post-op Pain Management:    Induction: Intravenous, Rapid sequence and Cricoid pressure planned  Airway Management Planned: Oral ETT  Additional Equipment:   Intra-op Plan:   Post-operative Plan: Extubation in OR  Informed Consent: I have reviewed the patients History and Physical, chart, labs and discussed the procedure including the risks, benefits and alternatives for the proposed anesthesia with the patient or authorized representative who has indicated his/her understanding and acceptance.     Plan Discussed with:   Anesthesia Plan Comments:         Anesthesia Quick Evaluation

## 2011-12-21 NOTE — Addendum Note (Signed)
Addendum  created 12/21/11 1121 by Laurene Footman, MD   Modules edited:Orders, PRL Based Order Sets

## 2011-12-21 NOTE — H&P (View-Only) (Signed)
Amber Harris is an 51 y.o. female. She is admitted for with removal of cervix. She has had symptomatic pressure due to the fibroids for years. [facility] included an ultrasound which was normal and approximately 300 g uterus with fibroids. She has been on Lupron with add back Aygestin 5 mg daily and this has resulted in some slight improvement in her discomfort and likely reduced size of the fibroid. She ha had endometrial biopsy that was benign.  Pertinent Gynecological History: Menses: Light flow while on Megace Bleeding: dysfunctional uterine bleeding while on Lupron resulting in use of Megace Contraception: abstinence and And hormonal with Lupron DES exposure: denies Blood transfusions: none Sexually transmitted diseases: no past history Previous GYN Procedures: Endometrial biopsy benign October 2012  Last mammogram: normal Date:  Last pap: normal Date: 2012 OB History: G3, P4   Menstrual History: Menarche age:  No LMP recorded. irregular bleeding on Lupron last normal period 10/25/2011    Past Medical History  Diagnosis Date  . Syncope     exertional  . Diabetes mellitus   . Hypertension   . Obesity   . Asthma     Uses p.r.n. albuterol  . Tobacco user     30 pack years; 04/2011: 1/4 pack per day during quick attempt  . Aortic valve disease     Long-standing systolic murmur  . Dyspnea on exertion     poor exercise tolerance  . Palpitations   . Depression with anxiety   . Fibroids     uterine; postmenopausal bleeding  . Degenerative joint disease     right knee    Past Surgical History  Procedure Date  . Tubal ligation     Family History  Problem Relation Age of Onset  . Lung cancer Mother   . Heart disease Brother   . Breast cancer Sister   . Heart disease Maternal Aunt     Social History:  reports that she has been smoking Cigarettes.  She has been smoking about .5 packs per day. She has never used smokeless  tobacco. She reports that she does not drink alcohol or use illicit drugs.  Allergies:  Allergies  Allergen Reactions  . Aspirin Other (See Comments)    hives  . Penicillins Other (See Comments)    rash     (Not in a hospital admission)  ROS  There were no vitals taken for this visit. Physical ExamPhysical Examination: General appearance - alert, well appearing, and in no distress, oriented to person, place, and time and overweight Mental status - alert, oriented to person, place, and time, normal mood, behavior, speech, dress, motor activity, and thought processes Neck - supple, no significant adenopathy Chest - clear to auscultation, no wheezes, rales or rhonchi, symmetric air entry Heart - normal rate and regular rhythm Abdomen - obesity with large panniculus from umbilicus to symphysis pubis no incision scars or hernias noted there is some fullness in the right lower quadrant associated with fibroids Pelvic - VULVA: normal appearing vulva with no masses, tenderness or lesions, VAGINA: normal appearing vagina with normal color and discharge, no lesions, CERVIX: normal appearing cervix without discharge or lesions, WET MOUNT done - results: DNA probe for chlamydia and GC obtained, DNA probe for chlamydia and GC obtained, UTERUS: enlarged to 14 week's size, ADNEXA: Fullness extends into the right adnexa where the fibroids have been noted by ultrasound in the past, exam limited by patient's morbid obesity Extremities -  peripheral pulses normal, no pedal edema, no clubbing or cyanosis, Homan's sign negative bilaterally No results found for this or any previous visit (from the past 24 hour(s)).  No results found.  Assessment Uterine fibroids in week size suppressed with Lupron x3 months   diabetes mellitus chronic hypertension morbid obesity Plan bowel prep followed by abdominal hysterectomy next Tuesday, 10/20/2012 likely through a midline incision risks of procedure been reviewed with  patient occluding bleeding infection wound healing difficulty another potential complications plans are for ovarian preservation:   Loriel Diehl V 12/15/2011, 11:04 AM

## 2011-12-21 NOTE — Op Note (Signed)
See detailed dictation included in Brief Op note.

## 2011-12-21 NOTE — Interval H&P Note (Signed)
History and Physical Interval Note:  12/21/2011 7:36 AM  Amber Harris  has presented today for surgery, with the diagnosis of uterine fibroids  The various methods of treatment have been discussed with the patient and family. After consideration of risks, benefits and other options for treatment, the patient has consented to  Procedure(s): HYSTERECTOMY ABDOMINAL as a surgical intervention .  The patients' history has been reviewed, patient examined, no change in status, stable for surgery.  I have reviewed the patients' chart and labs.  Questions were answered to the patient's satisfaction.     Amber Harris interviewed, and labs reviewed.  Patient had satisfactory results from bowel prep. Patient is NPO. Patient labs noted an Anti-Kell antibody, so patient was crossmatched for 2 units as precaution rather than Type and Screen. Patient has confirmed the desired procedure as "total abdominal hysterectomy with removal of cervix, uterus, tubes and ovaries."  Midline incision planned.

## 2011-12-21 NOTE — Anesthesia Postprocedure Evaluation (Addendum)
  Anesthesia Post-op Note  Patient: Amber Harris  Procedure(s) Performed:  HYSTERECTOMY ABDOMINAL  Patient Location: PACU  Anesthesia Type: General  Level of Consciousness: awake, alert  and oriented  Airway and Oxygen Therapy: Patient Spontanous Breathing and Patient connected to face mask oxygen  Post-op Pain: mild  Post-op Assessment: Post-op Vital signs reviewed, Patient's Cardiovascular Status Stable, Respiratory Function Stable and No signs of Nausea or vomiting  Post-op Vital Signs: Reviewed and stable  Complications: No apparent anesthesia complications 12/22/11  Patient doing well, VSS.  No apparent anethesia complications.

## 2011-12-21 NOTE — Transfer of Care (Signed)
Immediate Anesthesia Transfer of Care Note  Patient: Amber Harris  Procedure(s) Performed:  HYSTERECTOMY ABDOMINAL  Patient Location: PACU  Anesthesia Type: General  Level of Consciousness: awake and alert   Airway & Oxygen Therapy: Patient Spontanous Breathing and Patient connected to face mask oxygen  Post-op Assessment: Report given to PACU RN  Post vital signs: Reviewed and stable  Complications: No apparent anesthesia complications

## 2011-12-21 NOTE — Brief Op Note (Signed)
12/21/2011  11:20 AM  PATIENT:  Amber Harris  51 y.o. female  PRE-OPERATIVE DIAGNOSIS:  uterine fibroids  POST-OPERATIVE DIAGNOSIS:  uterine fibroids  PROCEDURE:  Procedure(s): HYSTERECTOMY ABDOMINAL  SURGEON:  Surgeon(s): Tilda Burrow, MD  PHYSICIAN ASSISTANT:   ASSISTANTSAnnabell Howells, RN FA   ANESTHESIA:   general  EBL:  Total I/O In: 2500 [I.V.:2500] Out: 370 [Urine:70; Blood:300]  BLOOD ADMINISTERED:none  DRAINS: Urinary Catheter (Foley)   LOCAL MEDICATIONS USED:  NONE  SPECIMEN:  Source of Specimen:  Uterus with cervix, Tubes and ovaries,   DISPOSITION OF SPECIMEN:  PATHOLOGY  COUNTS:  YES  TOURNIQUET:  * No tourniquets in log *  DICTATION: .Dragon Dictation Tetracaine was taken to the operating room general anesthesia introduced abdomen prepped and draped along with vagina. Foley catheter was inserted and timeout was conducted and procedure confirmed by all involved in the surgical team. Antibiotics had been administered. Vertical incision was made from umbilicus to above the lower normal panniculus crease excising a 10 cm wide ellipse of skin in his fat to allow for improved access to the pelvis midline entry the fascia was performed and for retractor positioned. The uterus was quite enlarged reaching almost to the umbilicus with multiple fundal fibroids. The ligaments were taken down bilaterally. The left very ligament could be identified and cut and suture ligated. Uterine vessels on the left side were skeletonized doubly clamped with  curved Heaney clamps, with Kelly clamp place for backbleeding, transected and doubly ligated with 0 chromic. Backbleeding was controlled with oversewing the Kelly clamp pedicle with figure-of-eight sutures of 0 chromic. The cardinal ligament was clamped cut and suture ligated in similar fashion. Some bleeding occurred from the fundus required additional figure-of-eight sutures on the uterine fundus and the edge was directed to the  patient's right side where utero-ovarian ligament was similarly clamped cut and suture ligated and uterine vessels skeletonized doubly clamped with curved Heaney clamps and transected with double ligature. 0 chromic was used for this. Malleable was placed behind the uterus and the uterine fundus amputated off the lower uterine segment for visualization improvement. Remainder of the lower cardinal ligaments were clamped cut and suture ligated on each side a stab incision made in the anterior cervicovaginal fornix bleeding was much of the cervical ring and supporting tissue as possible and rotated off of the cuff Aldridge stitches were placed at each lateral vaginal angle to attach the cuff angle to the lower cardinal ligament the middle portion of the cuff was then easily closed with interrupted 0 chromic sutures one single figure-of-eight suture was used to the right of the midline. A distress was excellent. Clots were inspected again for hemostasis. Was then directed to the infundibulopelvic ligaments bilaterally with identification of the ureter well out of the surgical field, crossclamping the ligament on the left side behind the ovary and removing the tube and ovary. Pedicles tied by 0 chromic. The ureter was well out of the way. Similarly the right side was treated. Removal of right tube and ovary accomplished without difficulty. Her ureters were identified prior to any surgical intervention. Abdomen was irrigated again, laparotomy tapes removed, and anterior peritoneum closed with running 2-0 chromic. The fascia was closed with 2 running segments PDS the upper and lower end of the incision sewing to the midline. Each step segment of PDS was ligated  separately. Subcutaneous fatty tissues were were irrigated and confirmed as hemostatic and then reapproximated pulling the fatty tissues and to close approximation with 2-0  plain, and then subcuticular 4-0 Vicryl used to close the skin with good tissue  approximation cc sponge and needle counts correct. Condition of patient to  recovery room stable condition.  PLAN OF CARE: Admit to inpatient   PATIENT DISPOSITION:  PACU - hemodynamically stable.   Delay start of Pharmacological VTE agent (>24hrs) due to surgical blood loss or risk of bleeding:  {YES/NO/NOT APPLICABLE:20182

## 2011-12-21 NOTE — Progress Notes (Signed)
FSBS 150

## 2011-12-22 LAB — BASIC METABOLIC PANEL
BUN: 12 mg/dL (ref 6–23)
Calcium: 8.8 mg/dL (ref 8.4–10.5)
Creatinine, Ser: 1.88 mg/dL — ABNORMAL HIGH (ref 0.50–1.10)
GFR calc non Af Amer: 30 mL/min — ABNORMAL LOW (ref >90)
Glucose, Bld: 162 mg/dL — ABNORMAL HIGH (ref 70–99)
Sodium: 136 mEq/L (ref 135–145)

## 2011-12-22 LAB — GLUCOSE, CAPILLARY: Glucose-Capillary: 126 mg/dL — ABNORMAL HIGH (ref 70–99)

## 2011-12-22 LAB — CBC
Hemoglobin: 11.7 g/dL — ABNORMAL LOW (ref 12.0–15.0)
MCH: 30.5 pg (ref 26.0–34.0)
MCHC: 32.8 g/dL (ref 30.0–36.0)

## 2011-12-22 MED ORDER — ALBUTEROL SULFATE HFA 108 (90 BASE) MCG/ACT IN AERS: 1.0000 | INHALATION_SPRAY | Freq: Four times a day (QID) | RESPIRATORY_TRACT | Status: DC | PRN

## 2011-12-22 MED ORDER — SODIUM CHLORIDE 0.9 % IV BOLUS (SEPSIS)
1000.0000 mL | Freq: Once | INTRAVENOUS | Status: AC
  Administered 2011-12-22: 1000 mL via INTRAVENOUS

## 2011-12-22 MED ORDER — FUROSEMIDE 10 MG/ML IJ SOLN
10.0000 mg | Freq: Once | INTRAMUSCULAR | Status: AC
  Administered 2011-12-22: 10 mg via INTRAVENOUS
  Filled 2011-12-22: qty 2

## 2011-12-22 MED ORDER — MENTHOL 3 MG MT LOZG
1.0000 | LOZENGE | OROMUCOSAL | Status: DC | PRN
  Administered 2011-12-22: 3 mg via ORAL
  Filled 2011-12-22: qty 9

## 2011-12-22 MED ORDER — HYDROMORPHONE 0.3 MG/ML IV SOLN
INTRAVENOUS | Status: AC
  Administered 2011-12-22: 14:00:00
  Filled 2011-12-22: qty 25

## 2011-12-22 MED ORDER — HYDROMORPHONE 0.3 MG/ML IV SOLN
INTRAVENOUS | Status: AC
  Administered 2011-12-22
  Filled 2011-12-22: qty 25

## 2011-12-22 MED ORDER — CEPASTAT 14.5 MG MT LOZG: 1.0000 | LOZENGE | OROMUCOSAL | Status: DC | PRN

## 2011-12-22 MED ORDER — LACTATED RINGERS IV BOLUS (SEPSIS)
500.0000 mL | Freq: Once | INTRAVENOUS | Status: AC
  Administered 2011-12-22: 500 mL via INTRAVENOUS

## 2011-12-22 MED ORDER — INSULIN ASPART 100 UNIT/ML ~~LOC~~ SOLN
0.0000 [IU] | Freq: Three times a day (TID) | SUBCUTANEOUS | Status: DC
  Administered 2011-12-22 (×2): 3 [IU] via SUBCUTANEOUS
  Filled 2011-12-22: qty 3

## 2011-12-22 NOTE — Progress Notes (Signed)
1 Day Post-Op Procedure(s): HYSTERECTOMY ABDOMINAL  Subjective: Patient reports tolerating PO and no problems voiding.  Urine output was low this morning  Objective: I have reviewed patient's vital signs, intake and output and labs. Hemoglobin & Hematocrit     Component Value Date/Time   HGB 11.7* 12/22/2011 0533   HCT 35.7* 12/22/2011 0533   BMET    Component Value Date/Time   NA 136 12/22/2011 0533   K 4.5 12/22/2011 0533   CL 105 12/22/2011 0533   CO2 23 12/22/2011 0533   GLUCOSE 162* 12/22/2011 0533   BUN 12 12/22/2011 0533   CREATININE 1.88* 12/22/2011 0533   CREATININE 0.71 05/11/2011 1200   CALCIUM 8.8 12/22/2011 0533   GFRNONAA 30* 12/22/2011 0533   GFRAA 35* 12/22/2011 0533  influenza  GI: normal findings: abd soft, nontender, dressing dry. Foley to be d/c'd  Assessment: s/p Procedure(s): HYSTERECTOMY ABDOMINAL: stable and diabetes type 2, to restart metformin  Plan: Advance diet Advance to PO medication  LOS: 1 day    Danyael Alipio V 12/22/2011, 8:53 AM

## 2011-12-22 NOTE — Progress Notes (Signed)
CARE MANAGEMENT NOTE 12/22/2011  Patient:  Amber Harris, Amber Harris   Account Number:  [id number]  Date Initiated:  12/22/2011  Documentation initiated by:  Rosemary Holms  Subjective/Objective Assessment:   Pt admitted for Hysterectomy. PTA lived at home with SO.     Action/Plan:   CM spoke to pt who states her son and Daughter in law want her to stay with them at dc and her finiance wants her to come home. No HH/DME needs identified.   Anticipated DC Date:  12/23/2011   Anticipated DC Plan:  HOME/SELF CARE  In-house referral  Financial Counselor      DC Planning Services  CM consult      Choice offered to / List presented to:             Status of service:  In process, will continue to follow Medicare Important Message given?   (If response is "NO", the following Medicare IM given date fields will be blank) Date Medicare IM given:   Date Additional Medicare IM given:    Discharge Disposition:    Per UR Regulation:    Comments:  12/22/11 1100 Klyde Banka Leanord Hawking RN BSN CM

## 2011-12-22 NOTE — Progress Notes (Signed)
Patient's urine output remains low.  Vital signs are fine. Since pt had a bowel prep the day before surgery, as well as surgical fluid changes, I suspect Pt remains volume depleted. Plan:  1000 cc NS bolus over 2 hours.

## 2011-12-22 NOTE — Progress Notes (Signed)
Dr Despina Hidden oncall for Dr Emelda Fear made aware of pts decreased urine outpt.  Orders given for 500cc LR bolus and 10mg  of IV Lasix.  Orders to be carried out by nursing staff.  Will continue to monitor pt. Jon Billings

## 2011-12-22 NOTE — Progress Notes (Signed)
UR Chart Review Completed  

## 2011-12-22 NOTE — Addendum Note (Signed)
Addendum  created 12/22/11 0948 by Glynn Octave, CRNA   Modules edited:Notes Section

## 2011-12-23 ENCOUNTER — Encounter (HOSPITAL_COMMUNITY): Payer: Self-pay | Admitting: Obstetrics and Gynecology

## 2011-12-23 LAB — GLUCOSE, CAPILLARY: Glucose-Capillary: 120 mg/dL — ABNORMAL HIGH (ref 70–99)

## 2011-12-23 MED ORDER — DSS 100 MG PO CAPS: 100.0000 mg | ORAL_CAPSULE | Freq: Two times a day (BID) | ORAL | Status: AC

## 2011-12-23 MED ORDER — OXYCODONE-ACETAMINOPHEN 5-325 MG PO TABS: 1.0000 | ORAL_TABLET | ORAL | Status: AC | PRN

## 2011-12-23 NOTE — Progress Notes (Signed)
Pt discharged home.  Pt given instructions on new meds and discharge instructions and incision care.  Pt will call and make follow up appt with MD.  Pt verbalizes understanding.

## 2012-01-04 NOTE — Discharge Summary (Signed)
Physician Discharge Summary  Patient ID: Amber Harris MRN: [id number] DOB/AGE: 1961/11/09 51 y.o.  Admit date: 12/21/2011 Discharge date: 01/04/2012  Admission Diagnoses: Uterine fibroids 18 weeks  Discharge Diagnoses: Uterine fibroids 18 weeks Active Problems:  * No active hospital problems. *    Discharged Condition: good  Hospital Course: This 51 year old female admitted for removal of multiple large fibroids by hysterectomy, TAH/BSO, underwent hysterectomy for lower abdominal midline incision 12/21/2011 removing is 690 g uterus and normal tubes and ovaries pathology was benign patient postoperative course was uneventful with discharge hemoglobin 11.7 compared to 12.3 preoperatively. The patient was afebrile her postop course. Diabetic management was straightforward on oral agents. She was discharged home in stable condition postop day 3  Consults: None  Significant Diagnostic Studies: labs ; CBC    Component Value Date/Time   WBC 7.9 12/22/2011 0533   RBC 3.84* 12/22/2011 0533   HGB 11.7* 12/22/2011 0533   HCT 35.7* 12/22/2011 0533   PLT 230 12/22/2011 0533   MCV 93.0 12/22/2011 0533   MCH 30.5 12/22/2011 0533   MCHC 32.8 12/22/2011 0533   RDW 13.2 12/22/2011 0533   LYMPHSABS 2.1 05/11/2011 1200   MONOABS 0.5 05/11/2011 1200   EOSABS 0.1 05/11/2011 1200   BASOSABS 0.0 05/11/2011 1200     Treatments: surgery: Hysterectomy bilateral salpingo-oophorectomy wide excision of abdominal excess skin and fat   Discharge Exam: Blood pressure 148/85, pulse 77, temperature 98.1 F (36.7 C), temperature source Oral, resp. rate 18, SpO2 96.00%. GI: soft, non-tender; bowel sounds normal; no masses,  no organomegaly and incision intact Extremities: Homans sign is negative, no sign of DVT  Disposition: Home or Self Care  Discharge Orders    Future Orders Please Complete By Expires   Diet - low sodium heart healthy      Increase activity slowly      Discharge instructions      Comments:     General Gynecological Post-Operative Instructions You may expect to feel dizzy, weak, and drowsy for as long as 24 hours after receiving the medicine that made you sleep (anesthetic). The following information pertains to your recovery period for the first 24 hours following surgery.  Do not drive a car, ride a bicycle, participate in physical activities, or take public transportation until you are done taking narcotic pain medicines or as directed by your caregiver.  Do not drink alcohol or take tranquilizers.  Do not take medicine that has not been prescribed by your caregiver.  Do not sign important papers or make important decisions while on narcotic pain medicines.  Have a responsible person with you.  CARE OF INCISION  Keep incision clean and dry. Take showers instead of baths until your caregiver gives you permission to take baths. Check with your caregiver if you have tubes coming from the wound site.  Avoid heavy lifting (more than 10 pounds/4.5 kilograms), pushing, or pulling.  Avoid activities that may risk injury to your surgical site.  Only take over-the-counter or prescription medicines for pain, discomfort, or fever as directed by your caregiver. Do not take aspirin. It can make you bleed. Take medicines (antibiotics) that kill germs as directed.  Call the office or go to the MAU if:  You feel sick to your stomach (nauseous).  You start to throw up (vomit).  You have trouble eating or drinking.  You have an oral temperature above 100.4.  You have constipation that is not helped by adjusting diet or increasing fluid intake. Pain medicines  are a common cause of constipation.  SEEK IMMEDIATE MEDICAL CARE IF:  You have persistent dizziness.  You have difficulty breathing or a congested sounding (croupy) cough.  You have an oral temperature above 102.5, not controlled by medicine.  There is increasing pain or tenderness near or in the surgical site.  ExitCare Patient Information  2011 [place], [place].   Driving Restrictions      Comments:   No driving x2 weeks   Sexual Activity Restrictions      Comments:   No Sexual activity x6 week   Discharge wound care:      Comments:   Use abdominal binder     Medication List  As of 01/04/2012  6:55 PM   STOP taking these medications         acetaminophen 650 MG CR tablet      megestrol 40 MG tablet         TAKE these medications         DULoxetine 60 MG capsule   Commonly known as: CYMBALTA   Take 60 mg by mouth daily.      HYDROcodone-acetaminophen 5-325 MG per tablet   Commonly known as: NORCO   Take 1 tablet by mouth every 6 (six) hours as needed. Pain      lisinopril-hydrochlorothiazide 20-12.5 MG per tablet   Commonly known as: PRINZIDE,ZESTORETIC   Take 1 tablet by mouth 2 (two) times daily.      metFORMIN 500 MG (MOD) 24 hr tablet   Commonly known as: GLUMETZA   Take 500 mg by mouth daily with breakfast.      metoprolol 50 MG tablet   Commonly known as: LOPRESSOR   Take 50 mg by mouth 2 (two) times daily.      PROVENTIL 90 MCG/ACT inhaler   Generic drug: albuterol   Inhale 2 puffs into the lungs every 6 (six) hours as needed. Asthmatic Symptoms      ranitidine 150 MG capsule   Commonly known as: ZANTAC   Take 1 capsule (150 mg total) by mouth 2 (two) times daily.      traZODone 150 MG tablet   Commonly known as: DESYREL   Take 150 mg by mouth at bedtime.           Follow-up Information    Follow up with Tilda Burrow, MD.   Contact information:   [facility] [address] [place] [address] [phone]          Signed: Tilda Burrow 01/04/2012, 6:55 PM

## 2012-02-07 ENCOUNTER — Other Ambulatory Visit: Payer: Self-pay | Admitting: Obstetrics and Gynecology

## 2012-03-22 ENCOUNTER — Other Ambulatory Visit: Payer: Self-pay | Admitting: Obstetrics and Gynecology

## 2012-03-22 ENCOUNTER — Other Ambulatory Visit (HOSPITAL_COMMUNITY): Payer: Self-pay | Admitting: Physician Assistant

## 2012-03-27 ENCOUNTER — Ambulatory Visit (HOSPITAL_COMMUNITY)
Admission: RE | Admit: 2012-03-27 | Discharge: 2012-03-27 | Disposition: A | Discharge status: Home / Self Care | Payer: Self-pay | Source: Ambulatory Visit | Attending: Obstetrics and Gynecology | Admitting: Obstetrics and Gynecology

## 2012-03-27 DIAGNOSIS — R220 Localized swelling, mass and lump, head: Secondary | ICD-10-CM | POA: Insufficient documentation

## 2012-03-29 ENCOUNTER — Other Ambulatory Visit (HOSPITAL_COMMUNITY): Payer: Self-pay | Admitting: Physician Assistant

## 2012-03-29 ENCOUNTER — Ambulatory Visit (HOSPITAL_COMMUNITY)
Admission: RE | Admit: 2012-03-29 | Discharge: 2012-03-29 | Disposition: A | Discharge status: Home / Self Care | Payer: Self-pay | Source: Ambulatory Visit | Attending: Physician Assistant | Admitting: Physician Assistant

## 2012-03-29 DIAGNOSIS — R109 Unspecified abdominal pain: Secondary | ICD-10-CM | POA: Insufficient documentation

## 2012-03-29 DIAGNOSIS — K5730 Diverticulosis of large intestine without perforation or abscess without bleeding: Secondary | ICD-10-CM | POA: Insufficient documentation

## 2012-03-29 MED ORDER — IOHEXOL 300 MG/ML  SOLN
100.0000 mL | Freq: Once | INTRAMUSCULAR | Status: AC | PRN
  Administered 2012-03-29: 100 mL via INTRAVENOUS

## 2012-04-04 NOTE — Consult Note (Signed)
NAME:  Amber Harris, Amber Harris                   ACCOUNT NO.:  MEDICAL RECORD NO.:  [id number]  LOCATION:                                 FACILITY:  PHYSICIAN:  Barbaraann Barthel, M.D. DATE OF BIRTH:  05/02/1961  DATE OF CONSULTATION:  04/03/2012 DATE OF DISCHARGE:                                CONSULTATION   NOTE:  [facility] with [facility].  HISTORY OF PRESENT MEDICAL ILLNESS:  The patient states that her abdominal pain began shortly after her total abdominal hysterectomy done in January 2013, by Dr. Emelda Fear.  This pain was unaccompanied with any nausea or vomiting or any change in her bowel habits.  She did have the pain that was worse when she lay down on her right side or after any kind of prolonged physical activity like cleaning her house.  She was referred after being seen by the [facility] for evaluation of this abdominal pain, and my understanding that she is also going to be seen by the Urology regarding her complaints of pollakiuria (voiding small amounts of urine frequently).  PHYSICAL EXAMINATION:  GENERAL:  She is in no acute distress. VITAL SIGNS:  She weighs 291 pounds.  She is 5 feet 6 inches. Temperature is 98.0, pulse 64, respirations 12, blood pressure 160/86. HEAD:  Normocephalic. EYES:  Extraocular movements are intact.  Pupils are round and reactive to light and accommodation.  There is no conjunctive pallor or scleral injection.  Sclera has a normal tincture.  Nose and oral mucosa are moist.  Her nasal mucosa appears somewhat erythematous.  No bruits are appreciated on her neck exam or is there adenopathy or thyromegaly. Jugular veins are flat. CHEST:  Clear, both to anterior and posterior auscultation.  There is no wheezing. HEART:  Regular rhythm. BREASTS AND AXILLA:  She has very pendulous breasts, however, no masses are palpated and axilla is without any masses, and she has a previous biopsy  of her left breast for benign disease done in 2010. ABDOMEN:  The patient is tender immediately under her midline infraumbilical incision.  She also has some pain in the right iliac fossa, not very far from her midline incision. RECTAL:  The stool is guaiac-negative. EXTREMITIES:  The patient has a +1 to +2 edema appreciated.  REVIEW OF SYSTEMS:  NEURO SYSTEM:  No history of migraines or seizures. She is taking Cymbalta for anxiety.  ENDOCRINE SYSTEM:  She is a type 2 diabetic.  No history of thyroid disease.  CARDIOPULMONARY SYSTEM:  She has some history of hypertension and asthma.  She used to smoke, however, she stopped smoking 8 days ago.  She has no history of alcohol abuse.  She stopped drinking 7 years ago.  MUSCULOSKELETAL SYSTEM:  She is obese.  She has had some arthritis in her knees, right elbows, and her neck and cervical lumbar spine pain.  OB/GYN HISTORY:  She is a gravida 3, para 4, cesarean 0, abortus 0 patient.  She has a family history of her sister having had carcinoma of the breast.  She had a mammogram done last year.  She is due  a mammogram this year.  She had a total abdominal hysterectomy and bilateral salpingo-oophorectomy in January 2013 by Dr. Emelda Fear.  GI SYSTEM:  No history of hepatitis.  She does have a history of constipation.  She has a history of no diarrhea or bright red rectal bleeding, melena, or history of inflammatory bowel disease or irritable bowel syndrome.  She has no past history of unexplained weight loss.  She has never had a colonoscopy.  GU SYSTEM: She has a history of pollakiuria. No history of dysuria or kidney stones.  She is scheduled to see a urologist for her pollakiuria.  REVIEW OF HISTORY AND PHYSICAL:  Therefore, I have reviewed her lab data and CT scan of the abdomen which is completely negative for either GI or GU pathology.  She is referred back to Dr. Emelda Fear for evaluation of her symptoms.  I have tried to reassure her that  this was likely resolving incisional discomfort and may take some time.  I also urged her to continue taking stool softeners as she has a sluggish bowel that is likely affected as well by her diabetes.  I also advised her to follow up on her mammograms as this is important with a strong first line relative history of carcinoma of the breast in her family.  She is also told to follow up for colonoscopy between the ages of 30 and 17.  I would be happy to be of service to the [facility] if needed in the future.  Otherwise, she is to follow up with Dr. Emelda Fear.     Barbaraann Barthel, M.D.     WB/MEDQ  D:  04/03/2012  T:  04/03/2012  Job:  [id number]  cc:   [facility]  Dr. Emelda Fear

## 2012-05-16 ENCOUNTER — Ambulatory Visit (INDEPENDENT_AMBULATORY_CARE_PROVIDER_SITE_OTHER): Payer: Self-pay | Admitting: Urology

## 2012-05-16 DIAGNOSIS — R339 Retention of urine, unspecified: Secondary | ICD-10-CM

## 2012-06-28 ENCOUNTER — Emergency Department (HOSPITAL_COMMUNITY): Payer: Self-pay

## 2012-06-28 ENCOUNTER — Emergency Department (HOSPITAL_COMMUNITY)
Admission: EM | Admit: 2012-06-28 | Discharge: 2012-06-28 | Disposition: A | Discharge status: Home / Self Care | Payer: Self-pay | Attending: Emergency Medicine | Admitting: Emergency Medicine

## 2012-06-28 ENCOUNTER — Encounter (HOSPITAL_COMMUNITY): Payer: Self-pay | Admitting: Emergency Medicine

## 2012-06-28 DIAGNOSIS — M1710 Unilateral primary osteoarthritis, unspecified knee: Secondary | ICD-10-CM | POA: Insufficient documentation

## 2012-06-28 DIAGNOSIS — I10 Essential (primary) hypertension: Secondary | ICD-10-CM | POA: Insufficient documentation

## 2012-06-28 DIAGNOSIS — E119 Type 2 diabetes mellitus without complications: Secondary | ICD-10-CM | POA: Insufficient documentation

## 2012-06-28 DIAGNOSIS — F17200 Nicotine dependence, unspecified, uncomplicated: Secondary | ICD-10-CM | POA: Insufficient documentation

## 2012-06-28 DIAGNOSIS — J4489 Other specified chronic obstructive pulmonary disease: Secondary | ICD-10-CM | POA: Insufficient documentation

## 2012-06-28 DIAGNOSIS — J449 Chronic obstructive pulmonary disease, unspecified: Secondary | ICD-10-CM | POA: Insufficient documentation

## 2012-06-28 MED ORDER — DEXAMETHASONE 4 MG PO TABS: ORAL_TABLET | ORAL | Status: DC

## 2012-06-28 MED ORDER — HYDROCODONE-ACETAMINOPHEN 5-325 MG PO TABS: ORAL_TABLET | ORAL | Status: DC

## 2012-06-28 MED ORDER — DEXAMETHASONE SODIUM PHOSPHATE 4 MG/ML IJ SOLN
8.0000 mg | Freq: Once | INTRAMUSCULAR | Status: AC
  Administered 2012-06-28: 8 mg via INTRAMUSCULAR
  Filled 2012-06-28: qty 2

## 2012-06-28 MED ORDER — HYDROCODONE-ACETAMINOPHEN 5-325 MG PO TABS
2.0000 | ORAL_TABLET | Freq: Once | ORAL | Status: AC
  Administered 2012-06-28: 2 via ORAL
  Filled 2012-06-28: qty 2

## 2012-06-28 MED ORDER — ONDANSETRON HCL 4 MG PO TABS
4.0000 mg | ORAL_TABLET | Freq: Once | ORAL | Status: AC
  Administered 2012-06-28: 4 mg via ORAL
  Filled 2012-06-28: qty 1

## 2012-06-28 NOTE — ED Notes (Signed)
Pt c/o left knee pain x a few days.

## 2012-06-28 NOTE — ED Notes (Signed)
Pt c/o left knee pain and denies any injury.  

## 2012-06-28 NOTE — ED Provider Notes (Signed)
Medical screening examination/treatment/procedure(s) were performed by non-physician practitioner and as supervising physician I was immediately available for consultation/collaboration.   Markiya Keefe W Aline Wesche, MD 06/28/12 1550 

## 2012-06-28 NOTE — ED Provider Notes (Signed)
History     CSN: [id number]  Arrival date & time 06/28/12  1147   First MD Initiated Contact with Patient 06/28/12 1247      Chief Complaint  Patient presents with  . Knee Pain    (Consider location/radiation/quality/duration/timing/severity/associated sxs/prior treatment) Patient is a 51 y.o. female presenting with knee pain. The history is provided by the patient.  Knee Pain This is a recurrent problem. The current episode started in the past 7 days. The problem occurs constantly. The problem has been gradually worsening. Associated symptoms include arthralgias. Pertinent negatives include no abdominal pain, chest pain, coughing or neck pain. The symptoms are aggravated by standing and walking. She has tried acetaminophen for the symptoms. The treatment provided no relief.    Past Medical History  Diagnosis Date  . Syncope     exertional  . Diabetes mellitus   . Hypertension   . Obesity   . Asthma     Uses p.r.n. albuterol  . Tobacco user     30 pack years; 04/2011: 1/4 pack per day during quick attempt  . Aortic valve disease     Long-standing systolic murmur  . Dyspnea on exertion     poor exercise tolerance  . Palpitations   . Depression with anxiety   . Fibroids     uterine; postmenopausal bleeding  . Degenerative joint disease     right knee  . COPD (chronic obstructive pulmonary disease)   . Headache   . Depression     Past Surgical History  Procedure Date  . Tubal ligation   . Abdominal hysterectomy 12/21/2011    Procedure: HYSTERECTOMY ABDOMINAL;  Surgeon: Tilda Burrow, MD;  Location: [facility];  Service: Gynecology;  Laterality: N/A;    Family History  Problem Relation Age of Onset  . Lung cancer Mother   . Heart disease Brother   . Breast cancer Sister   . Heart disease Maternal Aunt   . Anesthesia problems Neg Hx   . Hypotension Neg Hx   . Malignant hyperthermia Neg Hx   . Pseudochol deficiency Neg Hx     History  Substance Use Topics  .  Smoking status: Current Everyday Smoker -- 0.2 packs/day for 30 years    Types: Cigars  . Smokeless tobacco: Never Used  . Alcohol Use: No    OB History    Grav Para Term Preterm Abortions TAB SAB Ect Mult Living                  Review of Systems  Constitutional: Negative for activity change.       All ROS Neg except as noted in HPI  HENT: Negative for nosebleeds and neck pain.   Eyes: Negative for photophobia and discharge.  Respiratory: Positive for choking and wheezing. Negative for cough and shortness of breath.   Cardiovascular: Negative for chest pain and palpitations.  Gastrointestinal: Negative for abdominal pain and blood in stool.  Genitourinary: Negative for dysuria, frequency and hematuria.  Musculoskeletal: Positive for arthralgias. Negative for back pain.  Skin: Negative.   Neurological: Positive for syncope. Negative for dizziness, seizures and speech difficulty.  Psychiatric/Behavioral: Negative for hallucinations and confusion.    Allergies  Aspirin and Penicillins  Home Medications   Current Outpatient Rx  Name Route Sig Dispense Refill  . ACETAMINOPHEN 500 MG PO TABS Oral Take 1,500 mg by mouth daily as needed.    . ALBUTEROL 90 MCG/ACT IN AERS Inhalation Inhale 2 puffs into the lungs  2 (two) times daily. Asthmatic Symptoms    . DULOXETINE HCL 60 MG PO CPEP Oral Take 60 mg by mouth daily.      Marland Kitchen ESTRADIOL 1 MG PO TABS Oral Take 1 mg by mouth daily.    . IBUPROFEN 200 MG PO TABS Oral Take 600 mg by mouth every 6 (six) hours as needed. Pain    . LISINOPRIL-HYDROCHLOROTHIAZIDE 20-12.5 MG PO TABS Oral Take 1 tablet by mouth 2 (two) times daily.     Marland Kitchen METFORMIN HCL 500 MG PO TABS Oral Take 500 mg by mouth daily.    Marland Kitchen METOPROLOL TARTRATE 100 MG PO TABS Oral Take 100 mg by mouth 2 (two) times daily.    Marland Kitchen RANITIDINE HCL 150 MG PO CAPS Oral Take 1 capsule (150 mg total) by mouth 2 (two) times daily. 60 capsule 6  . TRAZODONE HCL 150 MG PO TABS Oral Take 150 mg by  mouth at bedtime.      Marland Kitchen DEXAMETHASONE 4 MG PO TABS  1 po daily with food 6 tablet 0  . HYDROCODONE-ACETAMINOPHEN 5-325 MG PO TABS  1 po q4h prn pain 16 tablet 0    BP 151/90  Pulse 72  Temp 98.1 F (36.7 C)  Resp 20  Ht 5' 6.5" (1.689 m)  Wt 275 lb (124.739 kg)  BMI 43.72 kg/m2  SpO2 100%  LMP 10/25/2011  Physical Exam  Nursing note and vitals reviewed. Constitutional: She is oriented to person, place, and time. She appears well-developed and well-nourished.  Non-toxic appearance.  HENT:  Head: Normocephalic.  Right Ear: Tympanic membrane and external ear normal.  Left Ear: Tympanic membrane and external ear normal.  Eyes: EOM and lids are normal. Pupils are equal, round, and reactive to light.  Neck: Normal range of motion. Neck supple. Carotid bruit is not present.  Cardiovascular: Normal rate, regular rhythm, normal heart sounds, intact distal pulses and normal pulses.   Pulmonary/Chest: Breath sounds normal. No respiratory distress. She has no wheezes. She has no rales.  Abdominal: Soft. Bowel sounds are normal. There is no tenderness. There is no guarding.  Musculoskeletal: Normal range of motion.       There is full range of motion of the left toes and ankle. There is good capillary refill of the left toes. The Achilles tendon is intact. There is a mild effusion of the left knee. The left knee is warm but not hot. The patella tracks in the midline but there is crepitus present. There is degenerative deformity of the knee. There is no deformity of the quadricep muscle area. There is no posterior mass present on the left.  Lymphadenopathy:       Head (right side): No submandibular adenopathy present.       Head (left side): No submandibular adenopathy present.    She has no cervical adenopathy.  Neurological: She is alert and oriented to person, place, and time. She has normal strength. No cranial nerve deficit or sensory deficit.  Skin: Skin is warm and dry.  Psychiatric:  She has a normal mood and affect. Her speech is normal.    ED Course  Procedures (including critical care time)  Labs Reviewed - No data to display Dg Knee Complete 4 Views Left  06/28/2012  *RADIOLOGY REPORT*  Clinical Data: Left knee pain and swelling.  LEFT KNEE - COMPLETE 4+ VIEW  Comparison: None.  Findings: Moderate joint space narrowing and proliferative changes are seen involving the medial compartment.  There also are  proliferative changes involving the tibial spines and the patella. No evidence of fracture or dislocation.  No bony lesions are identified.  There is a probable small amount of suprapatellar joint fluid.  IMPRESSION: Moderate degenerative changes of the medial compartment and evidence of patellofemoral degenerative disease.  Probable small suprapatellar joint effusion.  Original Report Authenticated By: Reola Calkins, M.D.     1. DJD (degenerative joint disease) of knee       MDM  I have reviewed nursing notes, vital signs, and all appropriate lab and imaging results for this patient. The x-ray of the left knee reveals moderate degenerative changes of the medial compartment and some suprapatellar joint effusion. The patient is advised to see an orthopedist his son as possible. Prescription for dexamethasone 4 mg daily and Norco one every 4 hours for pain also given. #16       Kathie Dike, PA 06/28/12 1404

## 2012-06-29 ENCOUNTER — Other Ambulatory Visit (HOSPITAL_COMMUNITY): Payer: Self-pay | Admitting: Physician Assistant

## 2012-06-29 DIAGNOSIS — Z139 Encounter for screening, unspecified: Secondary | ICD-10-CM

## 2012-07-10 ENCOUNTER — Ambulatory Visit (HOSPITAL_COMMUNITY)
Admission: RE | Admit: 2012-07-10 | Discharge: 2012-07-10 | Disposition: A | Discharge status: Home / Self Care | Payer: Self-pay | Source: Ambulatory Visit | Attending: Physician Assistant | Admitting: Physician Assistant

## 2012-07-10 DIAGNOSIS — Z139 Encounter for screening, unspecified: Secondary | ICD-10-CM

## 2012-08-24 ENCOUNTER — Encounter (HOSPITAL_COMMUNITY): Payer: Self-pay | Admitting: Emergency Medicine

## 2012-08-24 ENCOUNTER — Emergency Department (HOSPITAL_COMMUNITY)
Admission: EM | Admit: 2012-08-24 | Discharge: 2012-08-24 | Disposition: A | Discharge status: Home / Self Care | Payer: Self-pay | Attending: Emergency Medicine | Admitting: Emergency Medicine

## 2012-08-24 ENCOUNTER — Emergency Department (HOSPITAL_COMMUNITY): Payer: Self-pay

## 2012-08-24 DIAGNOSIS — E669 Obesity, unspecified: Secondary | ICD-10-CM | POA: Insufficient documentation

## 2012-08-24 DIAGNOSIS — R109 Unspecified abdominal pain: Secondary | ICD-10-CM | POA: Insufficient documentation

## 2012-08-24 DIAGNOSIS — I10 Essential (primary) hypertension: Secondary | ICD-10-CM | POA: Insufficient documentation

## 2012-08-24 DIAGNOSIS — E119 Type 2 diabetes mellitus without complications: Secondary | ICD-10-CM | POA: Insufficient documentation

## 2012-08-24 DIAGNOSIS — J4489 Other specified chronic obstructive pulmonary disease: Secondary | ICD-10-CM | POA: Insufficient documentation

## 2012-08-24 DIAGNOSIS — J449 Chronic obstructive pulmonary disease, unspecified: Secondary | ICD-10-CM | POA: Insufficient documentation

## 2012-08-24 DIAGNOSIS — Z79899 Other long term (current) drug therapy: Secondary | ICD-10-CM | POA: Insufficient documentation

## 2012-08-24 LAB — CBC WITH DIFFERENTIAL/PLATELET
Eosinophils Absolute: 0.1 10*3/uL (ref 0.0–0.7)
HCT: 39.9 % (ref 36.0–46.0)
Hemoglobin: 13.4 g/dL (ref 12.0–15.0)
Lymphs Abs: 2.7 10*3/uL (ref 0.7–4.0)
MCH: 30.1 pg (ref 26.0–34.0)
MCHC: 33.6 g/dL (ref 30.0–36.0)
MCV: 89.7 fL (ref 78.0–100.0)
Monocytes Absolute: 0.4 10*3/uL (ref 0.1–1.0)
Monocytes Relative: 8 % (ref 3–12)
Neutrophils Relative %: 32 % — ABNORMAL LOW (ref 43–77)
RBC: 4.45 MIL/uL (ref 3.87–5.11)

## 2012-08-24 LAB — URINALYSIS, ROUTINE W REFLEX MICROSCOPIC
Bilirubin Urine: NEGATIVE
Hgb urine dipstick: NEGATIVE
Ketones, ur: NEGATIVE mg/dL
Specific Gravity, Urine: >1.03 — ABNORMAL HIGH (ref 1.005–1.030)
pH: 5.5 (ref 5.0–8.0)

## 2012-08-24 LAB — BASIC METABOLIC PANEL
BUN: 8 mg/dL (ref 6–23)
Chloride: 105 mEq/L (ref 96–112)
Creatinine, Ser: 0.61 mg/dL (ref 0.50–1.10)
GFR calc non Af Amer: >90 mL/min (ref >90)
Glucose, Bld: 111 mg/dL — ABNORMAL HIGH (ref 70–99)
Potassium: 3.5 mEq/L (ref 3.5–5.1)

## 2012-08-24 MED ORDER — HYDROMORPHONE HCL PF 2 MG/ML IJ SOLN
2.0000 mg | Freq: Once | INTRAMUSCULAR | Status: AC
  Administered 2012-08-24: 2 mg via INTRAMUSCULAR
  Filled 2012-08-24: qty 1

## 2012-08-24 MED ORDER — OXYCODONE-ACETAMINOPHEN 5-325 MG PO TABS: 1.0000 | ORAL_TABLET | ORAL | Status: DC | PRN

## 2012-08-24 MED ORDER — ONDANSETRON 8 MG PO TBDP
8.0000 mg | ORAL_TABLET | Freq: Once | ORAL | Status: AC
  Administered 2012-08-24: 8 mg via ORAL
  Filled 2012-08-24: qty 1

## 2012-08-24 MED ORDER — OXYCODONE-ACETAMINOPHEN 5-325 MG PO TABS
1.0000 | ORAL_TABLET | Freq: Once | ORAL | Status: AC
  Administered 2012-08-24: 1 via ORAL
  Filled 2012-08-24: qty 1

## 2012-08-24 NOTE — ED Notes (Signed)
Patient states that she feels better after throwing up. Given the Zofran to help. Family at bedside to transport patient home. Offered patient wheelchair to go out but patient stated that she would rather walk. Ambulatory without assistance, no distress noted at this time.

## 2012-08-24 NOTE — ED Provider Notes (Signed)
History    This chart was scribed for Flint Melter, MD by Albertha Ghee Rifaie. This patient was seen in room [facility] and the patient's care was started at 17:20.  CSN: [id number]  Arrival date & time 08/24/12  1445   First MD Initiated Contact with Patient 08/24/12 1720      Chief Complaint  Patient presents with  . Pelvic Pain  . Abdominal Pain     The history is provided by the patient. No language interpreter was used.    Amber Harris is a 51 y.o. female who presents to the Emergency Department complaining of one day gradually worsening, gradual onset, constant left suprapubic pain that radiates to left abd and spine. Pain is associated with bilateral back pain that started this morning and now the pain is defined only in the left side of the back. Pain is aggravated with movement. Pt couldn't eat anything due to 3x Vomiting today.pt denies any vaginal discharge. She also c/o soft bowl movement but denies having blood or urinary problems at the present time as associated symptoms. Pt states that she had urinary problems 2 moths ago described as not urinating as frequent. She states having a h/o asthma, COPD and depression/ she reports taking Cymbalta for depression and metoprolol. She also states having a surgery for hysterectomy for uncontrollable bleeding. She denies having kidney stone.    Past Medical History  Diagnosis Date  . Syncope     exertional  . Diabetes mellitus   . Hypertension   . Obesity   . Asthma     Uses p.r.n. albuterol  . Tobacco user     30 pack years; 04/2011: 1/4 pack per day during quick attempt  . Aortic valve disease     Long-standing systolic murmur  . Dyspnea on exertion     poor exercise tolerance  . Palpitations   . Depression with anxiety   . Fibroids     uterine; postmenopausal bleeding  . Degenerative joint disease     right knee  . COPD (chronic obstructive pulmonary disease)   . Headache   . Depression     Past Surgical  History  Procedure Date  . Tubal ligation   . Abdominal hysterectomy 12/21/2011    Procedure: HYSTERECTOMY ABDOMINAL;  Surgeon: Tilda Burrow, MD;  Location: [facility];  Service: Gynecology;  Laterality: N/A;    Family History  Problem Relation Age of Onset  . Lung cancer Mother   . Heart disease Brother   . Breast cancer Sister   . Heart disease Maternal Aunt   . Anesthesia problems Neg Hx   . Hypotension Neg Hx   . Malignant hyperthermia Neg Hx   . Pseudochol deficiency Neg Hx     History  Substance Use Topics  . Smoking status: Current Some Day Smoker -- 0.2 packs/day for 30 years    Types: Cigars  . Smokeless tobacco: Never Used  . Alcohol Use: No    No OB history was provided.   Review of Systems  All other systems reviewed and are negative.    Allergies  Aspirin and Penicillins  Home Medications   Current Outpatient Rx  Name Route Sig Dispense Refill  . ACETAMINOPHEN 500 MG PO TABS Oral Take 1,500 mg by mouth daily as needed.    . ALBUTEROL 90 MCG/ACT IN AERS Inhalation Inhale 2 puffs into the lungs 2 (two) times daily. Asthmatic Symptoms    . DULOXETINE HCL 60 MG PO CPEP  Oral Take 60 mg by mouth daily.      Marland Kitchen ESTRADIOL 1 MG PO TABS Oral Take 1 mg by mouth daily.    Marland Kitchen GABAPENTIN 100 MG PO CAPS Oral Take 100 mg by mouth 3 (three) times daily.    . IBUPROFEN 200 MG PO TABS Oral Take 600 mg by mouth every 6 (six) hours as needed. Pain    . LISINOPRIL-HYDROCHLOROTHIAZIDE 20-12.5 MG PO TABS Oral Take 1 tablet by mouth 3 (three) times daily.     Marland Kitchen METFORMIN HCL ER 500 MG PO TB24 Oral Take 500 mg by mouth daily.    Marland Kitchen METOPROLOL TARTRATE 50 MG PO TABS Oral Take 50 mg by mouth 3 (three) times daily.    Marland Kitchen RANITIDINE HCL 150 MG PO CAPS Oral Take 150 mg by mouth daily.    Marland Kitchen RISPERIDONE 0.5 MG PO TABS Oral Take 0.5 mg by mouth 2 (two) times daily.    . TRAZODONE HCL 100 MG PO TABS Oral Take 150 mg by mouth at bedtime.    . OXYCODONE-ACETAMINOPHEN 5-325 MG PO TABS Oral Take  1 tablet by mouth every 4 (four) hours as needed for pain. 20 tablet 0    BP 168/105  Pulse 79  Temp 98.1 F (36.7 C) (Oral)  Resp 20  Ht 5\' 6"  (1.676 m)  Wt 275 lb (124.739 kg)  BMI 44.39 kg/m2  SpO2 95%  LMP 10/25/2011  Physical Exam  Nursing note and vitals reviewed. Constitutional: She is oriented to person, place, and time. She appears well-developed. No distress.       Obese   HENT:  Head: Normocephalic and atraumatic.       Ears are normal  Eyes: Conjunctivae normal and EOM are normal.       Eyes are normal  Neck: Neck supple. No tracheal deviation present.  Cardiovascular: Normal rate.   Murmur (Grade 1/6 systolic murmur ) heard. Pulmonary/Chest: Effort normal. No respiratory distress.       Clear lungs  Abdominal: Bowel sounds are normal. She exhibits no distension.       Moderate left lower abd tenderness   Genitourinary:       NEFG. No D/C from vaginal introitius  Musculoskeletal: Normal range of motion.       Left lumbar tenderness  Neurological: She is alert and oriented to person, place, and time. No sensory deficit.  Skin: Skin is dry.  Psychiatric: She has a normal mood and affect. Her behavior is normal.    ED Course  Procedures (including critical care time)  DIAGNOSTIC STUDIES: Oxygen Saturation is 95% on room air, adequate by my interpretation.    COORDINATION OF CARE: 5:20 PM Discussed treatment plan that includes CT scan, urian sample, blood test, Dilaudid injection 2mg , Zofran-ODT tablet 8 mg with pt at bedside and pt agreed to plan. 8:40 PM revaluation and pt is terrified and reports that she is still in pain. Test results were discussed with pt as normal. There seem to be social tension between the pt and the visitor. Discharge plans were discussed with pt at bedside and pt agreed to plan. 9:00 PM OxyCODONE-acetaminophen was ordered for pain.   Labs Reviewed  CBC WITH DIFFERENTIAL - Abnormal; Notable for the following:    Neutrophils  Relative 32 (*)     Neutro Abs 1.5 (*)     Lymphocytes Relative 57 (*)     All other components within normal limits  BASIC METABOLIC PANEL - Abnormal; Notable for the  following:    Glucose, Bld 111 (*)     All other components within normal limits  URINALYSIS, ROUTINE W REFLEX MICROSCOPIC - Abnormal; Notable for the following:    Specific Gravity, Urine >1.030 (*)     All other components within normal limits  URINE CULTURE  LAB REPORT - SCANNED   Ct Abdomen Pelvis Wo Contrast  08/24/2012  *RADIOLOGY REPORT*  Clinical Data: Left-sided pelvic pain  CT ABDOMEN AND PELVIS WITHOUT CONTRAST  Technique:  Multidetector CT imaging of the abdomen and pelvis was performed following the standard protocol without intravenous contrast.  Comparison: 03/29/12  Findings: Lung bases are unremarkable.  Small hiatal hernia. Sagittal images of the spine are unremarkable.  Unenhanced liver shows no biliary ductal dilatation.  No calcified gallstones are noted within gallbladder.  Pancreas spleen and adrenal glands are unremarkable.  Unenhanced kidneys are symmetrical in size.  No nephrolithiasis.  No hydronephrosis or hydroureter.  Bilateral no calcified ureteral calculi are noted.  Mild atherosclerotic calcifications abdominal aorta and the iliac arteries.  No aortic aneurysm.  No small bowel obstruction.  No ascites or free air.  No adenopathy.  There is no pericecal inflammation.  Normal appendix is clearly visualized axial image 58.  No distal colonic obstruction.  The patient is status post hysterectomy.  No pelvic ascites or adenopathy.  The urinary bladder is unremarkable.  Bilateral distal ureter is unremarkable.  Pelvic phleboliths are noted.  IMPRESSION:  1.  No nephrolithiasis.  No hydronephrosis or hydroureter. 2.  No calcified ureteral calculi are noted. 3.  Status post hysterectomy. 4.  No pericecal inflammation.  Normal appendix is clearly visualized.   Original Report Authenticated By: Natasha Mead, M.D.       1. Flank pain       MDM  Nonspecific Abdominal pain with negative ED evaluation. Doubt metabolic instability, serious bacterial infection or impending vascular collapse; the patient is stable for discharge. Will treat symptomatically for pain. And refer to PCP for definitive evaluation and treatment.      I personally performed the services described in this documentation, which was scribed in my presence. The recorded information has been reviewed and considered     Plan: Home Medications- percocet; Home Treatments- rest; Recommended follow up- PCP in 1 week   Flint Melter, MD 08/26/12 1157

## 2012-08-24 NOTE — ED Notes (Signed)
While getting dressed and standing up patient started c/o being nauseated, threw up once, states that she hasn't had anything to eat since this am. Dr Effie Shy notified, verbal order for Zofran ODT given.

## 2012-08-24 NOTE — ED Notes (Signed)
Pt c/o pelvic and abd pain with n/v since 9am.

## 2012-08-24 NOTE — ED Notes (Signed)
Patient states that she is starting to hurt some again, physician made aware. Patient is still groggy with slurred speech from previous pain meds. Awaiting physician to go talk to patient about results.

## 2012-08-25 LAB — URINE CULTURE: Culture: NO GROWTH

## 2012-10-03 ENCOUNTER — Ambulatory Visit: Payer: Self-pay | Admitting: Cardiology

## 2012-10-11 ENCOUNTER — Encounter: Payer: Self-pay | Admitting: Adult Health

## 2012-10-11 ENCOUNTER — Ambulatory Visit (INDEPENDENT_AMBULATORY_CARE_PROVIDER_SITE_OTHER): Payer: Self-pay | Admitting: Adult Health

## 2012-10-11 VITALS — HR 78 | Ht 66.0 in | Wt 297.0 lb

## 2012-10-11 DIAGNOSIS — F17200 Nicotine dependence, unspecified, uncomplicated: Secondary | ICD-10-CM

## 2012-10-11 DIAGNOSIS — I10 Essential (primary) hypertension: Secondary | ICD-10-CM

## 2012-10-11 DIAGNOSIS — E7800 Pure hypercholesterolemia, unspecified: Secondary | ICD-10-CM

## 2012-10-11 DIAGNOSIS — G4730 Sleep apnea, unspecified: Secondary | ICD-10-CM

## 2012-10-11 DIAGNOSIS — E785 Hyperlipidemia, unspecified: Secondary | ICD-10-CM | POA: Insufficient documentation

## 2012-10-11 DIAGNOSIS — Z720 Tobacco use: Secondary | ICD-10-CM

## 2012-10-11 DIAGNOSIS — R002 Palpitations: Secondary | ICD-10-CM

## 2012-10-11 MED ORDER — PRAVASTATIN SODIUM 20 MG PO TABS: 20.0000 mg | ORAL_TABLET | Freq: Every day | ORAL | Status: DC

## 2012-10-11 NOTE — Assessment & Plan Note (Signed)
She has had several medication changes within the last day per her primary care physician secondary to uncontrolled hypertension. Today in the office it is better controlled with a blood pressure 142/90. I am concerned that the patient's blood pressure control may be related to sleep apnea. She states that she snores, her fianc also states that she stops breathing at times, the patient wakes up very fatigued and tired, has frequent headaches as well. All of these symptoms can be related to sleep apnea, and difficult to control hypertension. We will not make any medication changes at this time. Will plan a sleep study for definitive diagnosis of sleep apnea, and need to have CPAP. If positive for sleep apnea we will defer back to her primary care physician to order a CPAP, in the hopes of helping her with blood pressure control and sleep hygiene. The patient verbalizes understanding and is willing to proceed with this test.

## 2012-10-11 NOTE — Assessment & Plan Note (Addendum)
I have placed the patient on Pravachol 20 mg at at bedtime. The patient has elevated LDL in the 111 with total cholesterol 193. New ACC recommendations for statin therapy for individuals 26-50 years of age with diabetes with an LDL of 70-189 without clinical evidence of  ASCVD are recommended for statin therapy, as she either has other risk factors of obesity, and hypertension, and history of tobacco abuse. We will reevaluate her lipid status in 6 weeks, this can be completed to [facility]. if she prefers. She is counseled on low cholesterol diet.

## 2012-10-11 NOTE — Assessment & Plan Note (Signed)
She continues to have frequent palpitations which she states have increased in duration and intensity. Review of records reveals that Dr. Dietrich Pates did have a 48-hour Holter monitor placed in 2011. We will repeat cardiac monitor for 5 days. May need to adjust medications to assist with her palpitations, also could be related to probable sleep apnea as well. We will wait for all test results to make further recommendations. This has been discussed with the patient who verbalizes understanding and is willing to wear the monitor for 5 days.

## 2012-10-11 NOTE — Assessment & Plan Note (Signed)
She has stopped smoking for one year. She has not returned to any minute smoking. I have congratulated her on her smoking cessation.

## 2012-10-11 NOTE — Progress Notes (Signed)
HPI: Amber Harris is a 51 year old morbidly obese female patient of Dr. Dietrich Pates we are following for ongoing assessment and treatment of hypertension, exertional dyspnea,  palpitations, frequent headaches, with ongoing history of type 2 diabetes, and hypercholesterolemia. She comes today after a followup appointment with her primary care physician Dr. Katherene Ponto of the [facility] free clinic. On that visit the patient but pressure was not being well controlled with resulting 180/90 , and therefore multiple medication changes for completed. She was newly placed on amlodipine 10 mg daily, lisinopril HCTZ was increased in dose and was changed to twice a day, metoprolol was increased to 100 mg twice a day. The patient was also increased on metformin dose to 1000 mg daily due to 2 elevated hemoglobin A1c of 7.5. She comes today with continued complaints of palpitations which have worsened, waking her up at night, racing heart rhythm, and continued headaches. Also has neuralgic pain in her legs with burning symptoms. She denies overt chest pain, but does have some mild dyspnea on exertion related to her obesity.  Allergies  Allergen Reactions  . Aspirin Hives  . Penicillins Rash    Current Outpatient Prescriptions  Medication Sig Dispense Refill  . acetaminophen (TYLENOL) 500 MG tablet Take 1,500 mg by mouth daily as needed.      Marland Kitchen albuterol (PROVENTIL) 90 MCG/ACT inhaler Inhale 2 puffs into the lungs 2 (two) times daily. Asthmatic Symptoms      . amLODipine (NORVASC) 10 MG tablet Take 10 mg by mouth daily.      . DULoxetine (CYMBALTA) 60 MG capsule Take 120 mg by mouth daily.       Marland Kitchen estradiol (ESTRACE) 1 MG tablet Take 1 mg by mouth daily.      Marland Kitchen gabapentin (NEURONTIN) 100 MG capsule Take 100 mg by mouth 3 (three) times daily.      Marland Kitchen ibuprofen (ADVIL,MOTRIN) 200 MG tablet Take 600 mg by mouth every 6 (six) hours as needed. Pain      . lisinopril-hydrochlorothiazide (PRINZIDE,ZESTORETIC) 20-12.5 MG  per tablet Take 2 tablets by mouth daily.       . metFORMIN (GLUCOPHAGE-XR) 500 MG 24 hr tablet Take 1,000 mg by mouth daily.       . metoprolol (LOPRESSOR) 50 MG tablet Take 100 mg by mouth 2 (two) times daily.       Marland Kitchen oxyCODONE-acetaminophen (PERCOCET/ROXICET) 5-325 MG per tablet Take 1 tablet by mouth every 4 (four) hours as needed for pain.  20 tablet  0  . pravastatin (PRAVACHOL) 20 MG tablet Take 1 tablet (20 mg total) by mouth daily.  30 tablet  6  . ranitidine (ZANTAC) 150 MG capsule Take 150 mg by mouth daily.      . risperiDONE (RISPERDAL) 0.5 MG tablet Take 0.5 mg by mouth 2 (two) times daily.      . traZODone (DESYREL) 100 MG tablet Take 150 mg by mouth at bedtime.      . [DISCONTINUED] pravastatin (PRAVACHOL) 20 MG tablet Take 20 mg by mouth daily.        Past Medical History  Diagnosis Date  . Syncope     exertional  . Diabetes mellitus   . Hypertension   . Obesity   . Asthma     Uses p.r.n. albuterol  . Tobacco user     30 pack years; 04/2011: 1/4 pack per day during quick attempt  . Aortic valve disease     Long-standing systolic murmur  . Dyspnea on exertion  poor exercise tolerance  . Palpitations   . Depression with anxiety   . Fibroids     uterine; postmenopausal bleeding  . Degenerative joint disease     right knee  . COPD (chronic obstructive pulmonary disease)   . Headache   . Depression     Past Surgical History  Procedure Date  . Tubal ligation   . Abdominal hysterectomy 12/21/2011    Procedure: HYSTERECTOMY ABDOMINAL;  Surgeon: Tilda Burrow, MD;  Location: [facility];  Service: Gynecology;  Laterality: N/A;    NFA:OZHYQM of systems complete and found to be negative unless listed above  ulse 78  Ht 5\' 6"  (1.676 m)  Wt 297 lb (134.718 kg)  BMI 47.94 kg/m2  SpO2 97%  LMP 10/25/2011 General: Well developed, well nourished, in no acute distress, morbidly obese Head: Eyes PERRLA, No xanthomas.   Normal cephalic and atramatic  Lungs: Clear  bilaterally to auscultation and percussion. Heart: HRRR S1 S2, without MRG.  Pulses are 2+ & equal.            No carotid bruit. No JVD.  No abdominal bruits. No femoral bruits. Abdomen: Bowel sounds are positive, abdomen soft and non-tender without masses or                  Hernia's noted. Obese. Msk:  Back normal, stiff, lumbering gait. Normal strength and tone for age. Extremities: No clubbing, cyanosis or edema.  DP +1 Neuro: Alert and oriented X 3. Psych:  Good affect, responds appropriately     Pulse 78  Ht 5\' 6"  (1.676 m)  Wt 297 lb (134.718 kg)  BMI 47.94 kg/m2  SpO2 97%  LMP 10/25/2011  EKG:NSR rate of 62 bpm  ASSESSMENT AND PLAN

## 2012-10-11 NOTE — Patient Instructions (Addendum)
Your physician has recommended that you have a sleep study. This test records several body functions during sleep, including: brain activity, eye movement, oxygen and carbon dioxide blood levels, heart rate and rhythm, breathing rate and rhythm, the flow of air through your mouth and nose, snoring, body muscle movements, and chest and belly movement.  Start Pravachol 20 mg every night   Have blood work lipids and liver panels done in 6 weeks will have done at health department   Cardiac Monitor for 1 week

## 2012-10-12 DIAGNOSIS — R002 Palpitations: Secondary | ICD-10-CM

## 2012-10-22 ENCOUNTER — Ambulatory Visit: Payer: Self-pay | Attending: Adult Health | Admitting: Sleep Medicine

## 2012-10-22 DIAGNOSIS — G4730 Sleep apnea, unspecified: Secondary | ICD-10-CM | POA: Insufficient documentation

## 2012-10-25 ENCOUNTER — Encounter: Payer: Self-pay | Admitting: Cardiology

## 2012-10-31 ENCOUNTER — Other Ambulatory Visit: Payer: Self-pay | Admitting: Adult Health

## 2012-10-31 DIAGNOSIS — I10 Essential (primary) hypertension: Secondary | ICD-10-CM

## 2012-10-31 DIAGNOSIS — G4730 Sleep apnea, unspecified: Secondary | ICD-10-CM

## 2012-10-31 DIAGNOSIS — E7800 Pure hypercholesterolemia, unspecified: Secondary | ICD-10-CM

## 2012-10-31 DIAGNOSIS — R002 Palpitations: Secondary | ICD-10-CM

## 2012-11-01 NOTE — Procedures (Signed)
[facility] Lajune Perine A. Gerilyn Pilgrim, MD     [email or web address]        NAMENYDIA, YTUARTE              ACCOUNT NO.:  [id number]  MEDICAL RECORD NO.:  [id number]          PATIENT TYPE:  OUT  LOCATION:  SLEEP LAB                     FACILITY:  [facility]  PHYSICIAN:  Glorianna Gott A. Gerilyn Pilgrim, M.D. DATE OF BIRTH:  10/10/1961  DATE OF STUDY:  10/22/2012                           NOCTURNAL POLYSOMNOGRAM  REFERRING PHYSICIAN:  Tania Ade MCELROY  REFERRING PHYSICIAN:  Bettey Mare. Lyman Bishop, NP  INDICATION:  A 51 year old who presents with fatigue, obesity, and snoring.  The study is being done to evaluate for obstructive sleep apnea syndrome.  INDICATION FOR STUDY:  EPWORTH SLEEPINESS SCORE:  MEDICATIONS:  Ibuprofen, lisinopril, oxycodone, Norvasc, acetaminophen, metoprolol, risperidone, estrogen, trazodone, Zantac, pravastatin, Neurontin, metformin, Cymbalta.  EPWORTH SLEEPINESS SCALE: 1. BMI 48.  ARCHITECTURAL SUMMARY:  Total recording time is 402 minutes, sleep efficiency 69%, sleep latency 44 minutes, REM latency 0.  Stage N1 11%, N2 80%, N3 9%, and REM sleep 0%.  RESPIRATORY SUMMARY:  Baseline oxygen saturation is 98, lowest saturation is 90 during non-REM sleep.  Diagnostic AHI is 3 and RDI 4.  LIMB MOVEMENT SUMMARY:  PLM index 4.  ELECTROCARDIOGRAM SUMMARY:  Average heart rate is 76 with no significant dysrhythmias observed.  IMPRESSION: 1. Abnormal sleep architecture with significantly reduced slow wave     sleep, which can be associated with nonrestorative sleep and     daytime fatigue. 2. Mild amounts of respiratory events do not meet the criteria for     significant sleep apnea, however.  Thanks for this referral.    Thang Flett A. Gerilyn Pilgrim, M.D.    KAD/MEDQ  D:  11/01/2012 09:17:26  T:  11/01/2012 09:33:56  Job:  [id number]

## 2012-11-27 ENCOUNTER — Ambulatory Visit (HOSPITAL_COMMUNITY)
Admission: RE | Admit: 2012-11-27 | Discharge: 2012-11-27 | Disposition: A | Discharge status: Home / Self Care | Payer: Self-pay | Source: Ambulatory Visit | Attending: Adult Health | Admitting: Adult Health

## 2012-11-27 ENCOUNTER — Ambulatory Visit: Payer: Self-pay | Admitting: Adult Health

## 2012-11-27 ENCOUNTER — Ambulatory Visit (INDEPENDENT_AMBULATORY_CARE_PROVIDER_SITE_OTHER): Payer: Self-pay | Admitting: Adult Health

## 2012-11-27 ENCOUNTER — Encounter: Payer: Self-pay | Admitting: Adult Health

## 2012-11-27 VITALS — BP 166/102 | HR 89 | Ht 66.0 in | Wt 297.0 lb

## 2012-11-27 DIAGNOSIS — R0989 Other specified symptoms and signs involving the circulatory and respiratory systems: Secondary | ICD-10-CM

## 2012-11-27 DIAGNOSIS — R0602 Shortness of breath: Secondary | ICD-10-CM

## 2012-11-27 DIAGNOSIS — I10 Essential (primary) hypertension: Secondary | ICD-10-CM

## 2012-11-27 DIAGNOSIS — R059 Cough, unspecified: Secondary | ICD-10-CM | POA: Insufficient documentation

## 2012-11-27 DIAGNOSIS — J3489 Other specified disorders of nose and nasal sinuses: Secondary | ICD-10-CM | POA: Insufficient documentation

## 2012-11-27 DIAGNOSIS — I359 Nonrheumatic aortic valve disorder, unspecified: Secondary | ICD-10-CM

## 2012-11-27 DIAGNOSIS — R05 Cough: Secondary | ICD-10-CM | POA: Insufficient documentation

## 2012-11-27 MED ORDER — VERAPAMIL HCL 120 MG PO TABS: 120.0000 mg | ORAL_TABLET | Freq: Two times a day (BID) | ORAL | Status: DC

## 2012-11-27 NOTE — Progress Notes (Deleted)
Name: ASHALEE OLIVIA    DOB: [birth year]  Age: 51 y.o.  MR#: [id number]       PCP:  Willow Ora, PA-C      Insurance: @PAYORNAME @   CC:   No chief complaint on file.   VS BP 166/102  Pulse 89  Ht 5\' 6"  (1.676 m)  Wt 297 lb (134.718 kg)  BMI 47.94 kg/m2  SpO2 95%  LMP 10/25/2011  Weights Current Weight  11/27/12 297 lb (134.718 kg)  10/11/12 297 lb (134.718 kg)  08/24/12 275 lb (124.739 kg)    Blood Pressure  BP Readings from Last 3 Encounters:  11/27/12 166/102  08/24/12 153/68  06/28/12 151/90     Admit date:  (Not on file) Last encounter with RMR:  10/31/2012   Allergy Allergies  Allergen Reactions  . Aspirin Hives  . Penicillins Rash    Current Outpatient Prescriptions  Medication Sig Dispense Refill  . acetaminophen (TYLENOL) 500 MG tablet Take 1,500 mg by mouth daily as needed.      Marland Kitchen albuterol (PROVENTIL) 90 MCG/ACT inhaler Inhale 2 puffs into the lungs 2 (two) times daily. Asthmatic Symptoms      . amLODipine (NORVASC) 10 MG tablet Take 10 mg by mouth daily.      . DULoxetine (CYMBALTA) 60 MG capsule Take 120 mg by mouth daily.       Marland Kitchen estradiol (ESTRACE) 1 MG tablet Take 1 mg by mouth daily.      Marland Kitchen gabapentin (NEURONTIN) 100 MG capsule Take 100 mg by mouth 3 (three) times daily.      Marland Kitchen ibuprofen (ADVIL,MOTRIN) 200 MG tablet Take 600 mg by mouth every 6 (six) hours as needed. Pain      . lisinopril-hydrochlorothiazide (PRINZIDE,ZESTORETIC) 20-12.5 MG per tablet Take 2 tablets by mouth daily.       . metFORMIN (GLUCOPHAGE-XR) 500 MG 24 hr tablet Take 1,000 mg by mouth daily.       . metoprolol (LOPRESSOR) 50 MG tablet Take 100 mg by mouth 2 (two) times daily.       . pravastatin (PRAVACHOL) 20 MG tablet Take 1 tablet (20 mg total) by mouth daily.  30 tablet  6  . ranitidine (ZANTAC) 150 MG capsule Take 150 mg by mouth daily.      . risperiDONE (RISPERDAL) 0.5 MG tablet Take 0.5 mg by mouth 2 (two) times daily.      . traZODone (DESYREL) 100 MG tablet  Take 150 mg by mouth at bedtime.        Discontinued Meds:    Medications Discontinued During This Encounter  Medication Reason  . oxyCODONE-acetaminophen (PERCOCET/ROXICET) 5-325 MG per tablet Error    Patient Active Problem List  Diagnosis  . Syncope  . DM (diabetes mellitus)  . Hypertension  . Obesity  . Tobacco user  . Aortic valve disease  . Dyspnea on exertion  . Palpitations  . Depression with anxiety  . Hyperlipidemia LDL goal <100    LABS No visits with results within 3 Month(s) from this visit. Latest known visit with results is:  Admission on 08/24/2012, Discharged on 08/24/2012  Component Date Value  . WBC 08/24/2012 4.8   . RBC 08/24/2012 4.45   . Hemoglobin 08/24/2012 13.4   . HCT 08/24/2012 39.9   . MCV 08/24/2012 89.7   . [facility] 08/24/2012 30.1   . MCHC 08/24/2012 33.6   . RDW 08/24/2012 13.2   . Platelets 08/24/2012 185   . Neutrophils Relative 08/24/2012  32*  . Neutro Abs 08/24/2012 1.5*  . Lymphocytes Relative 08/24/2012 57*  . Lymphs Abs 08/24/2012 2.7   . Monocytes Relative 08/24/2012 8   . Monocytes Absolute 08/24/2012 0.4   . Eosinophils Relative 08/24/2012 2   . Eosinophils Absolute 08/24/2012 0.1   . Basophils Relative 08/24/2012 1   . Basophils Absolute 08/24/2012 0.0   . Sodium 08/24/2012 138   . Potassium 08/24/2012 3.5   . Chloride 08/24/2012 105   . CO2 08/24/2012 25   . Glucose, Bld 08/24/2012 111*  . BUN 08/24/2012 8   . Creatinine, Ser 08/24/2012 0.61   . Calcium 08/24/2012 9.1   . GFR calc non Af Amer 08/24/2012 >90   . GFR calc Af Amer 08/24/2012 >90   . Color, Urine 08/24/2012 YELLOW   . APPearance 08/24/2012 CLEAR   . Specific Gravity, Urine 08/24/2012 >1.030*  . pH 08/24/2012 5.5   . Glucose, UA 08/24/2012 NEGATIVE   . Hgb urine dipstick 08/24/2012 NEGATIVE   . Bilirubin Urine 08/24/2012 NEGATIVE   . Ketones, ur 08/24/2012 NEGATIVE   . Protein, ur 08/24/2012 NEGATIVE   . Urobilinogen, UA 08/24/2012 0.2   . Nitrite  08/24/2012 NEGATIVE   . Leukocytes, UA 08/24/2012 NEGATIVE   . Specimen Description 08/24/2012 URINE, CATHETERIZED   . Special Requests 08/24/2012 NONE   . Culture  Setup Time 08/24/2012 08/24/2012 19:55   . Colony Count 08/24/2012 NO GROWTH   . Culture 08/24/2012 NO GROWTH   . Report Status 08/24/2012 08/25/2012 FINAL      Results for this Opt Visit:     Results for orders placed during the hospital encounter of 08/24/12  CBC WITH DIFFERENTIAL      Component Value Range   WBC 4.8  4.0 - 10.5 K/uL   RBC 4.45  3.87 - 5.11 MIL/uL   Hemoglobin 13.4  12.0 - 15.0 g/dL   HCT 72.5  36.6 - 44.0 %   MCV 89.7  78.0 - 100.0 fL   MCH 30.1  26.0 - 34.0 pg   MCHC 33.6  30.0 - 36.0 g/dL   RDW 34.7  42.5 - 95.6 %   Platelets 185  150 - 400 K/uL   Neutrophils Relative 32 (*) 43 - 77 %   Neutro Abs 1.5 (*) 1.7 - 7.7 K/uL   Lymphocytes Relative 57 (*) 12 - 46 %   Lymphs Abs 2.7  0.7 - 4.0 K/uL   Monocytes Relative 8  3 - 12 %   Monocytes Absolute 0.4  0.1 - 1.0 K/uL   Eosinophils Relative 2  0 - 5 %   Eosinophils Absolute 0.1  0.0 - 0.7 K/uL   Basophils Relative 1  0 - 1 %   Basophils Absolute 0.0  0.0 - 0.1 K/uL  BASIC METABOLIC PANEL      Component Value Range   Sodium 138  135 - 145 mEq/L   Potassium 3.5  3.5 - 5.1 mEq/L   Chloride 105  96 - 112 mEq/L   CO2 25  19 - 32 mEq/L   Glucose, Bld 111 (*) 70 - 99 mg/dL   BUN 8  6 - 23 mg/dL   Creatinine, Ser 3.87  0.50 - 1.10 mg/dL   Calcium 9.1  8.4 - 56.4 mg/dL   GFR calc non Af Amer >90  >90 mL/min   GFR calc Af Amer >90  >90 mL/min  URINALYSIS, ROUTINE W REFLEX MICROSCOPIC      Component Value Range  Color, Urine YELLOW  YELLOW   APPearance CLEAR  CLEAR   Specific Gravity, Urine >1.030 (*) 1.005 - 1.030   pH 5.5  5.0 - 8.0   Glucose, UA NEGATIVE  NEGATIVE mg/dL   Hgb urine dipstick NEGATIVE  NEGATIVE   Bilirubin Urine NEGATIVE  NEGATIVE   Ketones, ur NEGATIVE  NEGATIVE mg/dL   Protein, ur NEGATIVE  NEGATIVE mg/dL   Urobilinogen,  UA 0.2  0.0 - 1.0 mg/dL   Nitrite NEGATIVE  NEGATIVE   Leukocytes, UA NEGATIVE  NEGATIVE  URINE CULTURE      Component Value Range   Specimen Description URINE, CATHETERIZED     Special Requests NONE     Culture  Setup Time 08/24/2012 19:55     Colony Count NO GROWTH     Culture NO GROWTH     Report Status 08/25/2012 FINAL      EKG Orders placed in visit on 10/31/12  . CARDIAC EVENT MONITOR     Prior Assessment and Plan Problem List as of 11/27/2012          Syncope   Last Assessment & Plan Note   06/10/2011 Office Visit Signed 06/10/2011  9:50 PM by Kathlen Brunswick, MD    Etiology of exercise-induced syncope is unclear.  Symptoms may simply be due to severe physical deconditioning, obesity and moderate chronic lung disease.  I have referred Romya to Pulmonary Rehabilitation in an attempt to increase her exercise capacity.  I will reevaluate this nice woman after she has completed the program in a few months.    DM (diabetes mellitus)   Last Assessment & Plan Note   11/25/2011 Office Visit Signed 11/25/2011  3:53 PM by Kathlen Brunswick, MD    No hemoglobin A1c levels available; recent laboratory values performed by patient's PCP will be requested.    Hypertension   Last Assessment & Plan Note   10/11/2012 Office Visit Signed 10/11/2012 12:32 PM by Jodelle Gross, NP    She has had several medication changes within the last day per her primary care physician secondary to uncontrolled hypertension. Today in the office it is better controlled with a blood pressure 142/90. I am concerned that the patient's blood pressure control may be related to sleep apnea. She states that she snores, her fianc also states that she stops breathing at times, the patient wakes up very fatigued and tired, has frequent headaches as well. All of these symptoms can be related to sleep apnea, and difficult to control hypertension. We will not make any medication changes at this time. Will plan a  sleep study for definitive diagnosis of sleep apnea, and need to have CPAP. If positive for sleep apnea we will defer back to her primary care physician to order a CPAP, in the hopes of helping her with blood pressure control and sleep hygiene. The patient verbalizes understanding and is willing to proceed with this test.    Obesity   Last Assessment & Plan Note   11/25/2011 Office Visit Addendum 11/25/2011  3:59 PM by Kathlen Brunswick, MD    BMI in excess of 45 associated with significant obesity-related medical issues including hypertension and diabetes provides adequate indication for bariatric surgery; however, patient does not have insurance coverage or assets that would permit her to undergo this therapy.  She is willing to consider this option in the future should it become feasible from a financial standpoint.  Headache is nonspecific with a negative neurologic exam.  Symptomatic  treatment with over-the-counter analgesics is recommended with further evaluation by [facility] should symptoms persist.  She is also complaining of orthostatic lightheadedness, but has no significant change in blood pressure measurements on examination today.  The importance of avoiding loss of consciousness or any fall was explained to her as well as the means to do so.    Tobacco user   Last Assessment & Plan Note   10/11/2012 Office Visit Signed 10/11/2012 12:34 PM by Jodelle Gross, NP    She has stopped smoking for one year. She has not returned to any minute smoking. I have congratulated her on her smoking cessation.    Aortic valve disease   Last Assessment & Plan Note   05/10/2011 Office Visit Addendum 05/11/2011 10:02 AM by Kathlen Brunswick, MD    Examination suggests the presence of aortic sclerosis or mild aortic stenosis.  Considering the fact that the murmur has reportedly been present for years, a bicuspid aortic valve is most likely.  An echocardiogram performed in 08/2010 revealed aortic  annular calcification with mild insufficiency and no stenosis.  No other abnormalities were identified.  Repeat echocardiography will be deferred for the present.    Dyspnea on exertion   Last Assessment & Plan Note   07/26/2011 Office Visit Addendum 07/28/2011 10:18 PM by Kathlen Brunswick, MD    Symptoms are stable to improved.  Initial exercise tolerance in the rehabilitation program has beem reasonable.  There is an excellent likelihood that she will improve with further participation.  Dietary therapy with weight loss would likely provide even better functional status.  I will reassess this nice woman when she has had an additional 4 months of exercise and diet therapy.  If there has been no improvement in weight, further consideration can be given to surgical approaches.    Palpitations   Last Assessment & Plan Note   10/11/2012 Office Visit Signed 10/11/2012 12:33 PM by Jodelle Gross, NP    She continues to have frequent palpitations which she states have increased in duration and intensity. Review of records reveals that Dr. Dietrich Pates did have a 48-hour Holter monitor placed in 2011. We will repeat cardiac monitor for 5 days. May need to adjust medications to assist with her palpitations, also could be related to probable sleep apnea as well. We will wait for all test results to make further recommendations. This has been discussed with the patient who verbalizes understanding and is willing to wear the monitor for 5 days.    Depression with anxiety   Hyperlipidemia LDL goal <100   Last Assessment & Plan Note   10/11/2012 Office Visit Addendum 10/11/2012 12:39 PM by Jodelle Gross, NP    I have placed the patient on Pravachol 20 mg at at bedtime. The patient has elevated LDL in the 111 with total cholesterol 193. New ACC recommendations for statin therapy for individuals 25-50 years of age with diabetes with an LDL of 70-189 without clinical evidence of  ASCVD are recommended for statin  therapy, as she either has other risk factors of obesity, and hypertension, and history of tobacco abuse. We will reevaluate her lipid status in 6 weeks, this can be completed to [facility]. if she prefers. She is counseled on low cholesterol diet.        Imaging: No results found.   FRS Calculation: Score not calculated. Missing: Total Cholesterol

## 2012-11-27 NOTE — Patient Instructions (Addendum)
Your physician recommends that you schedule a follow-up appointment in: ONE MONTH  Your physician recommends that you schedule a follow-up appointment in: ONE WEEK FOR NURSE VISIT TO CHECK BLOOD PRESSURE  Your physician has recommended you make the following change in your medication:   1) STOP METEPROLOL 2)STOP AMLODIPINE 3)START VERAPAMIL 120MG  TWICE DAILY  A chest x-ray takes a picture of the organs and structures inside the chest, including the heart, lungs, and blood vessels. This test can show several things, including, whether the heart is enlarges; whether fluid is building up in the lungs; and whether pacemaker / defibrillator leads are still in place.TODAY  WE WILL REFER YOU TO SEE MD HAWKINS CONCERNING WORSENING ASTHMA

## 2012-11-27 NOTE — Assessment & Plan Note (Signed)
Can repeat her echo on next evaluation. Last one completed in 2011.

## 2012-11-27 NOTE — Assessment & Plan Note (Signed)
Refer to Dr. Juanetta Gosling, pulmonologist for recommendations. Likely also related to morbid obesity.

## 2012-11-27 NOTE — Progress Notes (Signed)
HPI: Amber Harris is a 51 y/o morbidly obese patient of Dr. Dietrich Pates we are following for ongoing assessment and treatment of hypertension, with known history of lung disease to include asthma, Type II diabetes, and hypercholesterolemia. She is followed by Jacquelin Hawking, PA at the [facility]. She comes today with worsening symptoms of asthma, elevated BP. She has had a sleep study completed along with a cardiac monitor as she was suspected of having sleep apnea causing difficult to control hypertension, with frequent palpitations. She states that she is using her rescue inhaler more frequently and having more wheezes. Medications have also been manipulated by Ms. McElroy to include increasing lisinopril and metoprolol dose. She denies chest pain, but continues DOE>   Allergies  Allergen Reactions  . Aspirin Hives  . Penicillins Rash    Current Outpatient Prescriptions  Medication Sig Dispense Refill  . acetaminophen (TYLENOL) 500 MG tablet Take 1,500 mg by mouth daily as needed.      Marland Kitchen albuterol (PROVENTIL) 90 MCG/ACT inhaler Inhale 2 puffs into the lungs 2 (two) times daily. Asthmatic Symptoms      . amLODipine (NORVASC) 10 MG tablet Take 10 mg by mouth daily.      . DULoxetine (CYMBALTA) 60 MG capsule Take 120 mg by mouth daily.       Marland Kitchen estradiol (ESTRACE) 1 MG tablet Take 1 mg by mouth daily.      Marland Kitchen gabapentin (NEURONTIN) 100 MG capsule Take 100 mg by mouth 3 (three) times daily.      Marland Kitchen ibuprofen (ADVIL,MOTRIN) 200 MG tablet Take 600 mg by mouth every 6 (six) hours as needed. Pain      . lisinopril-hydrochlorothiazide (PRINZIDE,ZESTORETIC) 20-12.5 MG per tablet Take 2 tablets by mouth daily.       . metFORMIN (GLUCOPHAGE-XR) 500 MG 24 hr tablet Take 1,000 mg by mouth daily.       . metoprolol (LOPRESSOR) 50 MG tablet Take 100 mg by mouth 2 (two) times daily.       . pravastatin (PRAVACHOL) 20 MG tablet Take 1 tablet (20 mg total) by mouth daily.  30 tablet  6  . ranitidine  (ZANTAC) 150 MG capsule Take 150 mg by mouth daily.      . risperiDONE (RISPERDAL) 0.5 MG tablet Take 0.5 mg by mouth 2 (two) times daily.      . traZODone (DESYREL) 100 MG tablet Take 150 mg by mouth at bedtime.        Past Medical History  Diagnosis Date  . Syncope     exertional  . Diabetes mellitus   . Hypertension   . Obesity   . Asthma     Uses p.r.n. albuterol  . Tobacco user     30 pack years; 04/2011: 1/4 pack per day during quick attempt  . Aortic valve disease     Long-standing systolic murmur  . Dyspnea on exertion     poor exercise tolerance  . Palpitations   . Depression with anxiety   . Fibroids     uterine; postmenopausal bleeding  . Degenerative joint disease     right knee  . COPD (chronic obstructive pulmonary disease)   . Headache   . Depression     Past Surgical History  Procedure Date  . Tubal ligation   . Abdominal hysterectomy 12/21/2011    Procedure: HYSTERECTOMY ABDOMINAL;  Surgeon: Tilda Burrow, MD;  Location: [facility];  Service: Gynecology;  Laterality: N/A;    ZOX:WRUEAV of  systems complete and found to be negative unless listed above  PHYSICAL EXAM BP 166/102  Pulse 89  Ht 5\' 6"  (1.676 m)  Wt 297 lb (134.718 kg)  BMI 47.94 kg/m2  SpO2 95%  LMP 10/25/2011  General: Well developed, well nourished, in no acute distress Head: Eyes PERRLA, No xanthomas.   Normal cephalic and atramatic  Lungs:  Expiratory wheezes with mild crackles in the bases. Heart: HRRR S1 S2, tachycardic  With 1/6 systolic murmur and soft S4.  Pulses are 2+ & equal.            No carotid bruit. No JVD.  No abdominal bruits. No femoral bruits. Abdomen: Bowel sounds are positive, abdomen soft and non-tender without masses or                  Hernia's noted. Msk:  Back normal, normal gait. Normal strength and tone for age. Extremities: No clubbing, cyanosis or edema.  DP +1 Neuro: Alert and oriented X 3. Psych:  Good affect, responds appropriately  EKG: (Nov 2013)  NSR with rate of 62 bpm.  ASSESSMENT AND PLAN

## 2012-11-27 NOTE — Assessment & Plan Note (Signed)
This patient has multiple issues, not the least of which is uncontrolled hypertension. Review of sleep study completed on 10/22/2012, revealed, " Abnormal sleep architecture with significantly reduced slow wave sleep, which can be associated with non restoriative sleep and daytime fatigue. Mild amounts of respiratory events do not meet criteria for significant sleep apnea, however. Baseline oxygen sat 98%,  Lowest sat 90% during non-REM sleep."  I will take her off of metoprolol as this may be worsening her asthma symptoms causing bronchospasms. I will also stop the amlodipine. Change to verapamil 120 mg BID to assist with both HR and BP, she may need up titration. This medication is on [employer] $4 formlulary. . EF of last echo 65%. She will follow up with [place] in one week for BP check with the nurses. Two weeks clinic follow up.

## 2012-11-28 NOTE — Addendum Note (Signed)
Addended by: Derry Lory A on: 11/28/2012 02:55 PM   Modules accepted: Orders

## 2012-12-05 ENCOUNTER — Ambulatory Visit (INDEPENDENT_AMBULATORY_CARE_PROVIDER_SITE_OTHER): Payer: Self-pay | Admitting: *Deleted

## 2012-12-05 VITALS — BP 180/94 | HR 69 | Ht 66.0 in | Wt 290.0 lb

## 2012-12-05 DIAGNOSIS — I10 Essential (primary) hypertension: Secondary | ICD-10-CM

## 2012-12-05 NOTE — Progress Notes (Signed)
Recheck her in one week after acute illness is over.

## 2012-12-05 NOTE — Progress Notes (Signed)
Pt present to nurse visit today per recent medication changes, pt notes DX of Bronchitis noted yesterday, pt was advised to increase her inhaler to 4 times daily, take levaquin 750mg  x5days, prednisone 50mg  x5days, pt also noted BP at PCP office as 160/70 yesterday however advised her BP may be up today per pain in her back due to coughing  VS/D/C INSTRUCTIONS FROM RECENT OV on 11-27-12:  BP 166/102  Pulse 89  Ht 5\' 6"  (1.676 m)  Wt 297 lb (134.718 kg)  BMI 47.94 kg/m2  SpO2 95%  LMP 10/25/2011   Your physician recommends that you schedule a follow-up appointment in: ONE MONTH  Your physician recommends that you schedule a follow-up appointment in: ONE WEEK FOR NURSE VISIT TO CHECK BLOOD PRESSURE  Your physician has recommended you make the following change in your medication:  1) STOP METEPROLOL  2)STOP AMLODIPINE  3)START VERAPAMIL 120MG  TWICE DAILY  A chest x-ray takes a picture of the organs and structures inside the chest, including the heart, lungs, and blood vessels. This test can show several things, including, whether the heart is enlarges; whether fluid is building up in the lungs; and whether pacemaker / defibrillator leads are still in place.TODAY  WE WILL REFER YOU TO SEE MD HAWKINS CONCERNING WORSENING ASTHMA

## 2012-12-06 NOTE — Progress Notes (Signed)
Scheduled pt for follow up in 2 weeks per pt notes she just started ABT/prednisone today and it may take a while to feel better also with the weather, pt accepted apt on 12-20-12 at 4pm, pt will also keep upcoming apt noted with MD RR on 01-03-13

## 2012-12-20 ENCOUNTER — Telehealth: Payer: Self-pay | Admitting: Cardiology

## 2012-12-20 NOTE — Telephone Encounter (Signed)
Patient no showed appointment.  Spoke with patient to reschedule appointment.  / tgs

## 2012-12-22 ENCOUNTER — Encounter: Payer: Self-pay | Admitting: *Deleted

## 2012-12-22 ENCOUNTER — Ambulatory Visit (INDEPENDENT_AMBULATORY_CARE_PROVIDER_SITE_OTHER): Payer: Self-pay | Admitting: *Deleted

## 2012-12-22 VITALS — BP 147/82 | HR 73 | Ht 66.0 in | Wt 298.8 lb

## 2012-12-22 DIAGNOSIS — I10 Essential (primary) hypertension: Secondary | ICD-10-CM

## 2012-12-22 NOTE — Patient Instructions (Addendum)
Your physician recommends that you schedule a follow-up appointment in: WE WILL CONTACT YOU WITH THE NEXT STEPS ONCE THE MD HAS REVIEWED RESULTS FROM TODAY

## 2012-12-22 NOTE — Progress Notes (Signed)
Pt presented to [facility] per BP control and recent DX of Bronchitis, pt was advised on 12-05-12 to return for BP check after resolve of DX and completion of ABT/Steriods/Pain related to cough, however pt noted as a No Show at 12-20-12 [facility] and was re-scheduled for today, pt advised she feels a lot better today and notes less headaches, she has completed all her medications, pt advised that her Clinic MD started her on 2 new medications on 12-13-12, noted exchanged Janumet 50/500 bid for Metformin XR, in addition to Clonidine 0.62mb BID also started on 12-13-12  VITALS/D/C INSTRUCTIONS FROM LAST [facility]:  BP 180/94, HR 73, O2 99 VIA ROOM AIR  Joni Reining, NP 12/05/2012 11:30 AM Signed  Recheck her in one week after acute illness is over. Derry Lory, LPN 12/04/1094 0:45 AM Signed  Scheduled pt for follow up in 2 weeks per pt notes she just started ABT/prednisone today and it may take a while to feel better also with the weather, pt accepted apt on 12-20-12 at 4pm, pt will also keep upcoming apt noted with MD RR on 01-03-13

## 2013-01-03 ENCOUNTER — Encounter: Payer: Self-pay | Admitting: Cardiology

## 2013-01-03 ENCOUNTER — Ambulatory Visit (INDEPENDENT_AMBULATORY_CARE_PROVIDER_SITE_OTHER): Payer: Self-pay | Admitting: Cardiology

## 2013-01-03 ENCOUNTER — Telehealth (HOSPITAL_COMMUNITY): Payer: Self-pay | Admitting: Dietician

## 2013-01-03 VITALS — BP 138/88 | HR 82 | Ht 66.0 in | Wt 295.0 lb

## 2013-01-03 DIAGNOSIS — R0989 Other specified symptoms and signs involving the circulatory and respiratory systems: Secondary | ICD-10-CM

## 2013-01-03 DIAGNOSIS — I10 Essential (primary) hypertension: Secondary | ICD-10-CM

## 2013-01-03 DIAGNOSIS — E669 Obesity, unspecified: Secondary | ICD-10-CM

## 2013-01-03 DIAGNOSIS — F17200 Nicotine dependence, unspecified, uncomplicated: Secondary | ICD-10-CM

## 2013-01-03 DIAGNOSIS — R002 Palpitations: Secondary | ICD-10-CM

## 2013-01-03 DIAGNOSIS — I359 Nonrheumatic aortic valve disorder, unspecified: Secondary | ICD-10-CM

## 2013-01-03 DIAGNOSIS — E119 Type 2 diabetes mellitus without complications: Secondary | ICD-10-CM

## 2013-01-03 DIAGNOSIS — E785 Hyperlipidemia, unspecified: Secondary | ICD-10-CM

## 2013-01-03 DIAGNOSIS — Z720 Tobacco use: Secondary | ICD-10-CM

## 2013-01-03 MED ORDER — PRAVASTATIN SODIUM 40 MG PO TABS: 40.0000 mg | ORAL_TABLET | Freq: Every evening | ORAL | Status: DC

## 2013-01-03 NOTE — Assessment & Plan Note (Addendum)
Monitored and treated at [facility].  Suboptimal control at present.

## 2013-01-03 NOTE — Progress Notes (Signed)
Patient ID: Amber Harris, female   DOB: 05/03/1961, 52 y.o.   MRN: [id number]  HPI: Scheduled return visit for this nice woman with mild valvular heart disease, hypertension and diabetes. She has experienced no recurrence of syncope and denies chest discomfort or dyspnea on exertion. Diabetic control has been suboptimal with CBGs in the 200s. Blood pressure is generally well controlled when assessed at home, but occasionally elevated.  Prior to Admission medications   Medication Sig Start Date End Date Taking? Authorizing Provider  acetaminophen (TYLENOL) 500 MG tablet Take 1,500 mg by mouth daily as needed.   Yes Historical Provider, MD  albuterol (PROVENTIL) 90 MCG/ACT inhaler Inhale 2 puffs into the lungs 4 (four) times daily. Asthmatic Symptoms   Yes Historical Provider, MD  cloNIDine (CATAPRES) 0.1 MG tablet Take 0.1 mg by mouth 2 (two) times daily.   Yes Historical Provider, MD  DULoxetine (CYMBALTA) 60 MG capsule Take 120 mg by mouth daily.    Yes Historical Provider, MD  estradiol (ESTRACE) 1 MG tablet Take 1 mg by mouth daily.   Yes Historical Provider, MD  gabapentin (NEURONTIN) 100 MG capsule Take 100 mg by mouth 3 (three) times daily.   Yes Historical Provider, MD  ibuprofen (ADVIL,MOTRIN) 200 MG tablet Take 600 mg by mouth every 6 (six) hours as needed. Pain   Yes Historical Provider, MD  lisinopril-hydrochlorothiazide (PRINZIDE,ZESTORETIC) 20-12.5 MG per tablet Take 2 tablets by mouth daily.    Yes Historical Provider, MD  ranitidine (ZANTAC) 150 MG capsule Take 150 mg by mouth daily. 07/26/11  Yes Amber Brunswick, MD  risperiDONE (RISPERDAL) 0.5 MG tablet Take 0.5 mg by mouth 2 (two) times daily.   Yes Historical Provider, MD  sitaGLIPtan-metformin (JANUMET) 50-500 MG per tablet Take 1 tablet by mouth 2 (two) times daily with a meal.   Yes Historical Provider, MD  traZODone (DESYREL) 100 MG tablet Take 150 mg by mouth at bedtime.   Yes Historical Provider, MD  verapamil (CALAN)  120 MG tablet Take 1 tablet (120 mg total) by mouth 2 (two) times daily. 11/27/12  Yes Amber Gross, NP  pravastatin (PRAVACHOL) 40 MG tablet Take 1 tablet (40 mg total) by mouth every evening. 01/03/13   Amber Brunswick, MD   Allergies  Allergen Reactions  . Aspirin Hives  . Penicillins Rash  Past medical history, social history, and family history reviewed and updated.  ROS: Denies orthopnea, PND, lightheadedness. She notes mild intermittent pedal edema. She uses no extra salt in her diet and attempts to limit total salt intake. She has never sought or received assistance for treatment of obesity.  PHYSICAL EXAM: BP 138/88  Pulse 82  Ht 5\' 6"  (1.676 m)  Wt 133.811 kg (295 lb)  BMI 47.61 kg/m2  LMP 10/25/2011  General-Well developed; no acute distress Body habitus-obese Neck-No JVD; no carotid bruits Lungs-clear lung fields; resonant to percussion Cardiovascular-normal PMI; normal S1 and S2; modest intermittent systolic ejection murmur Abdomen-normal bowel sounds; soft and non-tender without masses or organomegaly Musculoskeletal-No deformities, no cyanosis or clubbing Neurologic-Normal cranial nerves; symmetric strength and tone Skin-Warm, no significant lesions Extremities-distal pulses intact; trace edema  ASSESSMENT AND PLAN:  [name], MD 01/03/2013 12:15 PM

## 2013-01-03 NOTE — Assessment & Plan Note (Signed)
Patient quit smoking approximately one year ago, but continues to experience substantial craving, which she treats with nicotine gum.Marland Kitchen She was congratulated on this effort and encouraged to continue.

## 2013-01-03 NOTE — Assessment & Plan Note (Signed)
Blood pressure has been adequately controlled in recent weeks. Current medication will be continued.

## 2013-01-03 NOTE — Assessment & Plan Note (Signed)
Patient will be referred to a dietitian for assistance in reducing caloric intake. Has chronic left knee problems and is exercising as much and she considers feasible. She has never participated in a formal program, which may be another option. In the absence of  Medical insurance, she would not be a candidate for surgical intervention, but we will assist her to investigate whether she can obtain coverage under the ACA.

## 2013-01-03 NOTE — Assessment & Plan Note (Signed)
Patient has minimal valvular disease, which will not likely cause significant issues in the foreseeable future.

## 2013-01-03 NOTE — Telephone Encounter (Signed)
Received referral via fax from [facility] (Dr. Dietrich Pates) for dx: obesity.

## 2013-01-03 NOTE — Patient Instructions (Addendum)
Your physician recommends that you schedule a follow-up appointment in: 9 months  Your physician recommends that you return for lab work in: 3 weeks - ADDEMDUM - Does not need labs, per Dr Dietrich Pates  Your physician has recommended you make the following change in your medication:  1 - START Pravachol 40 mg daily

## 2013-01-03 NOTE — Telephone Encounter (Signed)
Called pt at 1403. Appointment scheduled for 01/18/13 at 10:00 AM.

## 2013-01-03 NOTE — Progress Notes (Deleted)
Name: Amber Harris    DOB: [birth year]  Age: 52 y.o.  MR#: [id number]       PCP:  Willow Ora, PA-C      Insurance: @PAYORNAME @   CC:   No chief complaint on file.   VS BP 138/88  Pulse 82  Ht 5\' 6"  (1.676 m)  Wt 295 lb (133.811 kg)  BMI 47.61 kg/m2  LMP 10/25/2011  Weights Current Weight  01/03/13 295 lb (133.811 kg)  12/22/12 298 lb 12 oz (135.512 kg)  12/05/12 290 lb (131.543 kg)    Blood Pressure  BP Readings from Last 3 Encounters:  01/03/13 138/88  12/22/12 147/82  12/05/12 180/94     Admit date:  (Not on file) Last encounter with RMR:  12/20/2012   Allergy Allergies  Allergen Reactions  . Aspirin Hives  . Penicillins Rash    Current Outpatient Prescriptions  Medication Sig Dispense Refill  . acetaminophen (TYLENOL) 500 MG tablet Take 1,500 mg by mouth daily as needed.      Marland Kitchen albuterol (PROVENTIL) 90 MCG/ACT inhaler Inhale 2 puffs into the lungs 4 (four) times daily. Asthmatic Symptoms      . cloNIDine (CATAPRES) 0.1 MG tablet Take 0.1 mg by mouth 2 (two) times daily.      . DULoxetine (CYMBALTA) 60 MG capsule Take 120 mg by mouth daily.       Marland Kitchen estradiol (ESTRACE) 1 MG tablet Take 1 mg by mouth daily.      Marland Kitchen gabapentin (NEURONTIN) 100 MG capsule Take 100 mg by mouth 3 (three) times daily.      Marland Kitchen ibuprofen (ADVIL,MOTRIN) 200 MG tablet Take 600 mg by mouth every 6 (six) hours as needed. Pain      . lisinopril-hydrochlorothiazide (PRINZIDE,ZESTORETIC) 20-12.5 MG per tablet Take 2 tablets by mouth daily.       . pravastatin (PRAVACHOL) 20 MG tablet Take 1 tablet (20 mg total) by mouth daily.  30 tablet  6  . ranitidine (ZANTAC) 150 MG capsule Take 150 mg by mouth daily.      . risperiDONE (RISPERDAL) 0.5 MG tablet Take 0.5 mg by mouth 2 (two) times daily.      . sitaGLIPtan-metformin (JANUMET) 50-500 MG per tablet Take 1 tablet by mouth 2 (two) times daily with a meal.      . traZODone (DESYREL) 100 MG tablet Take 150 mg by mouth at bedtime.      .  verapamil (CALAN) 120 MG tablet Take 1 tablet (120 mg total) by mouth 2 (two) times daily.  60 tablet  3    Discontinued Meds:   There are no discontinued medications.  Patient Active Problem List  Diagnosis  . Syncope  . DM (diabetes mellitus)  . Hypertension  . Obesity  . Tobacco user  . Aortic valve disease  . Dyspnea on exertion  . Palpitations  . Depression with anxiety  . Hyperlipidemia LDL goal <100    LABS No visits with results within 3 Month(s) from this visit. Latest known visit with results is:  Admission on 08/24/2012, Discharged on 08/24/2012  Component Date Value  . WBC 08/24/2012 4.8   . RBC 08/24/2012 4.45   . Hemoglobin 08/24/2012 13.4   . HCT 08/24/2012 39.9   . MCV 08/24/2012 89.7   . [facility] 08/24/2012 30.1   . MCHC 08/24/2012 33.6   . RDW 08/24/2012 13.2   . Platelets 08/24/2012 185   . Neutrophils Relative 08/24/2012 32*  . Neutro Abs 08/24/2012  1.5*  . Lymphocytes Relative 08/24/2012 57*  . Lymphs Abs 08/24/2012 2.7   . Monocytes Relative 08/24/2012 8   . Monocytes Absolute 08/24/2012 0.4   . Eosinophils Relative 08/24/2012 2   . Eosinophils Absolute 08/24/2012 0.1   . Basophils Relative 08/24/2012 1   . Basophils Absolute 08/24/2012 0.0   . Sodium 08/24/2012 138   . Potassium 08/24/2012 3.5   . Chloride 08/24/2012 105   . CO2 08/24/2012 25   . Glucose, Bld 08/24/2012 111*  . BUN 08/24/2012 8   . Creatinine, Ser 08/24/2012 0.61   . Calcium 08/24/2012 9.1   . GFR calc non Af Amer 08/24/2012 >90   . GFR calc Af Amer 08/24/2012 >90   . Color, Urine 08/24/2012 YELLOW   . APPearance 08/24/2012 CLEAR   . Specific Gravity, Urine 08/24/2012 >1.030*  . pH 08/24/2012 5.5   . Glucose, UA 08/24/2012 NEGATIVE   . Hgb urine dipstick 08/24/2012 NEGATIVE   . Bilirubin Urine 08/24/2012 NEGATIVE   . Ketones, ur 08/24/2012 NEGATIVE   . Protein, ur 08/24/2012 NEGATIVE   . Urobilinogen, UA 08/24/2012 0.2   . Nitrite 08/24/2012 NEGATIVE   . Leukocytes,  UA 08/24/2012 NEGATIVE   . Specimen Description 08/24/2012 URINE, CATHETERIZED   . Special Requests 08/24/2012 NONE   . Culture  Setup Time 08/24/2012 08/24/2012 19:55   . Colony Count 08/24/2012 NO GROWTH   . Culture 08/24/2012 NO GROWTH   . Report Status 08/24/2012 08/25/2012 FINAL      Results for this Opt Visit:     Results for orders placed during the hospital encounter of 08/24/12  CBC WITH DIFFERENTIAL      Component Value Range   WBC 4.8  4.0 - 10.5 K/uL   RBC 4.45  3.87 - 5.11 MIL/uL   Hemoglobin 13.4  12.0 - 15.0 g/dL   HCT 16.1  09.6 - 04.5 %   MCV 89.7  78.0 - 100.0 fL   MCH 30.1  26.0 - 34.0 pg   MCHC 33.6  30.0 - 36.0 g/dL   RDW 40.9  81.1 - 91.4 %   Platelets 185  150 - 400 K/uL   Neutrophils Relative 32 (*) 43 - 77 %   Neutro Abs 1.5 (*) 1.7 - 7.7 K/uL   Lymphocytes Relative 57 (*) 12 - 46 %   Lymphs Abs 2.7  0.7 - 4.0 K/uL   Monocytes Relative 8  3 - 12 %   Monocytes Absolute 0.4  0.1 - 1.0 K/uL   Eosinophils Relative 2  0 - 5 %   Eosinophils Absolute 0.1  0.0 - 0.7 K/uL   Basophils Relative 1  0 - 1 %   Basophils Absolute 0.0  0.0 - 0.1 K/uL  BASIC METABOLIC PANEL      Component Value Range   Sodium 138  135 - 145 mEq/L   Potassium 3.5  3.5 - 5.1 mEq/L   Chloride 105  96 - 112 mEq/L   CO2 25  19 - 32 mEq/L   Glucose, Bld 111 (*) 70 - 99 mg/dL   BUN 8  6 - 23 mg/dL   Creatinine, Ser 7.82  0.50 - 1.10 mg/dL   Calcium 9.1  8.4 - 95.6 mg/dL   GFR calc non Af Amer >90  >90 mL/min   GFR calc Af Amer >90  >90 mL/min  URINALYSIS, ROUTINE W REFLEX MICROSCOPIC      Component Value Range   Color, Urine YELLOW  YELLOW  APPearance CLEAR  CLEAR   Specific Gravity, Urine >1.030 (*) 1.005 - 1.030   pH 5.5  5.0 - 8.0   Glucose, UA NEGATIVE  NEGATIVE mg/dL   Hgb urine dipstick NEGATIVE  NEGATIVE   Bilirubin Urine NEGATIVE  NEGATIVE   Ketones, ur NEGATIVE  NEGATIVE mg/dL   Protein, ur NEGATIVE  NEGATIVE mg/dL   Urobilinogen, UA 0.2  0.0 - 1.0 mg/dL   Nitrite  NEGATIVE  NEGATIVE   Leukocytes, UA NEGATIVE  NEGATIVE  URINE CULTURE      Component Value Range   Specimen Description URINE, CATHETERIZED     Special Requests NONE     Culture  Setup Time 08/24/2012 19:55     Colony Count NO GROWTH     Culture NO GROWTH     Report Status 08/25/2012 FINAL      EKG Orders placed in visit on 10/31/12  . CARDIAC EVENT MONITOR     Prior Assessment and Plan Problem List as of 01/03/2013            Cardiology Problems   Syncope   Last Assessment & Plan Note   06/10/2011 Office Visit Signed 06/10/2011  9:50 PM by Kathlen Brunswick, MD    Etiology of exercise-induced syncope is unclear.  Symptoms may simply be due to severe physical deconditioning, obesity and moderate chronic lung disease.  I have referred Tahitia to Pulmonary Rehabilitation in an attempt to increase her exercise capacity.  I will reevaluate this nice woman after she has completed the program in a few months.    Hypertension   Last Assessment & Plan Note   11/27/2012 Office Visit Signed 11/27/2012  1:52 PM by Jodelle Gross, NP    This patient has multiple issues, not the least of which is uncontrolled hypertension. Review of sleep study completed on 10/22/2012, revealed, " Abnormal sleep architecture with significantly reduced slow wave sleep, which can be associated with non restoriative sleep and daytime fatigue. Mild amounts of respiratory events do not meet criteria for significant sleep apnea, however. Baseline oxygen sat 98%,  Lowest sat 90% during non-REM sleep."  I will take her off of metoprolol as this may be worsening her asthma symptoms causing bronchospasms. I will also stop the amlodipine. Change to verapamil 120 mg BID to assist with both HR and BP, she may need up titration. This medication is on [employer] $4 formlulary. . EF of last echo 65%. She will follow up with [place] in one week for BP check with the nurses. Two weeks clinic follow up.     Aortic valve disease   Last  Assessment & Plan Note   11/27/2012 Office Visit Signed 11/27/2012  1:53 PM by Jodelle Gross, NP    Can repeat her echo on next evaluation. Last one completed in 2011.    Hyperlipidemia LDL goal <100   Last Assessment & Plan Note   10/11/2012 Office Visit Addendum 10/11/2012 12:39 PM by Jodelle Gross, NP    I have placed the patient on Pravachol 20 mg at at bedtime. The patient has elevated LDL in the 111 with total cholesterol 193. New ACC recommendations for statin therapy for individuals 79-53 years of age with diabetes with an LDL of 70-189 without clinical evidence of  ASCVD are recommended for statin therapy, as she either has other risk factors of obesity, and hypertension, and history of tobacco abuse. We will reevaluate her lipid status in 6 weeks, this can be completed to [facility] if she prefers. She is counseled on low cholesterol diet.      Other   DM (diabetes mellitus)   Last Assessment & Plan Note   11/25/2011 Office Visit Signed 11/25/2011  3:53 PM by Kathlen Brunswick, MD    No hemoglobin A1c levels available; recent laboratory values performed by patient's PCP will be requested.    Obesity   Last Assessment & Plan Note   11/25/2011 Office Visit Addendum 11/25/2011  3:59 PM by Kathlen Brunswick, MD    BMI in excess of 45 associated with significant obesity-related medical issues including hypertension and diabetes provides adequate indication for bariatric surgery; however, patient does not have insurance coverage or assets that would permit her to undergo this therapy.  She is willing to consider this option in the future should it become feasible from a financial standpoint.  Headache is nonspecific with a negative neurologic exam.  Symptomatic treatment with over-the-counter analgesics is recommended with further evaluation by [facility] should symptoms persist.  She is also complaining of orthostatic lightheadedness, but has no  significant change in blood pressure measurements on examination today.  The importance of avoiding loss of consciousness or any fall was explained to her as well as the means to do so.    Tobacco user   Last Assessment & Plan Note   10/11/2012 Office Visit Signed 10/11/2012 12:34 PM by Jodelle Gross, NP    She has stopped smoking for one year. She has not returned to any minute smoking. I have congratulated her on her smoking cessation.    Dyspnea on exertion   Last Assessment & Plan Note   11/27/2012 Office Visit Signed 11/27/2012  1:52 PM by Jodelle Gross, NP    Refer to Dr. Juanetta Gosling, pulmonologist for recommendations. Likely also related to morbid obesity.    Palpitations   Last Assessment & Plan Note   10/11/2012 Office Visit Signed 10/11/2012 12:33 PM by Jodelle Gross, NP    She continues to have frequent palpitations which she states have increased in duration and intensity. Review of records reveals that Dr. Dietrich Pates did have a 48-hour Holter monitor placed in 2011. We will repeat cardiac monitor for 5 days. May need to adjust medications to assist with her palpitations, also could be related to probable sleep apnea as well. We will wait for all test results to make further recommendations. This has been discussed with the patient who verbalizes understanding and is willing to wear the monitor for 5 days.    Depression with anxiety       Imaging: No results found.   FRS Calculation: Score not calculated. Missing: Total Cholesterol

## 2013-01-03 NOTE — Assessment & Plan Note (Addendum)
Patient is being treated with low-dose pravastatin. This will be increased to 40 mg per day and a repeat lipid profile obtained.

## 2013-01-03 NOTE — Assessment & Plan Note (Signed)
No recent palpitations reported by patient. No recurrent syncope.

## 2013-01-10 ENCOUNTER — Telehealth (HOSPITAL_COMMUNITY): Payer: Self-pay | Admitting: Dietician

## 2013-01-10 NOTE — Telephone Encounter (Signed)
Mailed appointment confirmation letter and instructions for appointment scheduled 01/18/13 at 10 AM via [place] [facility].

## 2013-01-18 ENCOUNTER — Telehealth (HOSPITAL_COMMUNITY): Payer: Self-pay | Admitting: Dietician

## 2013-01-18 NOTE — Telephone Encounter (Signed)
Pt was a no-show for appointment scheduled for 01/18/2013 at 10:00 AM. Sent letter to pt home notifying pt of no-show and requesting rescheduling appointment.

## 2013-04-24 ENCOUNTER — Emergency Department (HOSPITAL_COMMUNITY): Payer: Self-pay

## 2013-04-24 ENCOUNTER — Encounter (HOSPITAL_COMMUNITY): Payer: Self-pay

## 2013-04-24 ENCOUNTER — Emergency Department (HOSPITAL_COMMUNITY)
Admission: EM | Admit: 2013-04-24 | Discharge: 2013-04-24 | Disposition: A | Discharge status: Home / Self Care | Payer: Self-pay | Attending: Emergency Medicine | Admitting: Emergency Medicine

## 2013-04-24 DIAGNOSIS — Z8739 Personal history of other diseases of the musculoskeletal system and connective tissue: Secondary | ICD-10-CM | POA: Insufficient documentation

## 2013-04-24 DIAGNOSIS — Z8679 Personal history of other diseases of the circulatory system: Secondary | ICD-10-CM | POA: Insufficient documentation

## 2013-04-24 DIAGNOSIS — F3289 Other specified depressive episodes: Secondary | ICD-10-CM | POA: Insufficient documentation

## 2013-04-24 DIAGNOSIS — M25551 Pain in right hip: Secondary | ICD-10-CM

## 2013-04-24 DIAGNOSIS — F329 Major depressive disorder, single episode, unspecified: Secondary | ICD-10-CM | POA: Insufficient documentation

## 2013-04-24 DIAGNOSIS — M5430 Sciatica, unspecified side: Secondary | ICD-10-CM | POA: Insufficient documentation

## 2013-04-24 DIAGNOSIS — Z8742 Personal history of other diseases of the female genital tract: Secondary | ICD-10-CM | POA: Insufficient documentation

## 2013-04-24 DIAGNOSIS — J4489 Other specified chronic obstructive pulmonary disease: Secondary | ICD-10-CM | POA: Insufficient documentation

## 2013-04-24 DIAGNOSIS — R52 Pain, unspecified: Secondary | ICD-10-CM | POA: Insufficient documentation

## 2013-04-24 DIAGNOSIS — I10 Essential (primary) hypertension: Secondary | ICD-10-CM | POA: Insufficient documentation

## 2013-04-24 DIAGNOSIS — M5431 Sciatica, right side: Secondary | ICD-10-CM

## 2013-04-24 DIAGNOSIS — Z880 Allergy status to penicillin: Secondary | ICD-10-CM | POA: Insufficient documentation

## 2013-04-24 DIAGNOSIS — E669 Obesity, unspecified: Secondary | ICD-10-CM | POA: Insufficient documentation

## 2013-04-24 DIAGNOSIS — Z8709 Personal history of other diseases of the respiratory system: Secondary | ICD-10-CM | POA: Insufficient documentation

## 2013-04-24 DIAGNOSIS — Z79899 Other long term (current) drug therapy: Secondary | ICD-10-CM | POA: Insufficient documentation

## 2013-04-24 DIAGNOSIS — Z8669 Personal history of other diseases of the nervous system and sense organs: Secondary | ICD-10-CM | POA: Insufficient documentation

## 2013-04-24 DIAGNOSIS — F341 Dysthymic disorder: Secondary | ICD-10-CM | POA: Insufficient documentation

## 2013-04-24 DIAGNOSIS — J449 Chronic obstructive pulmonary disease, unspecified: Secondary | ICD-10-CM | POA: Insufficient documentation

## 2013-04-24 DIAGNOSIS — F17200 Nicotine dependence, unspecified, uncomplicated: Secondary | ICD-10-CM | POA: Insufficient documentation

## 2013-04-24 DIAGNOSIS — M25559 Pain in unspecified hip: Secondary | ICD-10-CM | POA: Insufficient documentation

## 2013-04-24 DIAGNOSIS — E119 Type 2 diabetes mellitus without complications: Secondary | ICD-10-CM | POA: Insufficient documentation

## 2013-04-24 MED ORDER — OXYCODONE-ACETAMINOPHEN 5-325 MG PO TABS: 1.0000 | ORAL_TABLET | ORAL | Status: DC | PRN

## 2013-04-24 MED ORDER — OXYCODONE-ACETAMINOPHEN 5-325 MG PO TABS
2.0000 | ORAL_TABLET | Freq: Once | ORAL | Status: AC
  Administered 2013-04-24: 2 via ORAL
  Filled 2013-04-24: qty 2

## 2013-04-24 MED ORDER — CYCLOBENZAPRINE HCL 10 MG PO TABS: 10.0000 mg | ORAL_TABLET | Freq: Three times a day (TID) | ORAL | Status: DC | PRN

## 2013-04-24 NOTE — ED Notes (Signed)
Pt c/o pain in r hip radiating down r leg since last Friday.  Denies any injury.

## 2013-04-24 NOTE — ED Provider Notes (Signed)
History     CSN: [id number]  Arrival date & time 04/24/13  1139   First MD Initiated Contact with Patient 04/24/13 1156      Chief Complaint  Patient presents with  . Hip Pain    (Consider location/radiation/quality/duration/timing/severity/associated sxs/prior treatment) HPI Comments: Amber Harris is a 52 y.o. female who presents to the Emergency Department complaining of right hip pain.  States the pain has been worsening for 4 days.  She denies known injury.  Describes the pain as sharp and burning sensation from the buttock, hip and down her lateral right leg to her knee.  She denies fever, abd pain, vomiting incontinence of bladder or bowel, or dysuria .  pain is improved with rest and certain positions, worsening with walking or standing  The history is provided by the patient.    Past Medical History  Diagnosis Date  . Syncope     exertional  . Diabetes mellitus   . Hypertension   . Obesity   . Asthma     Uses p.r.n. albuterol  . Tobacco user     30 pack years; 04/2011: 1/4 pack per day during quick attempt  . Aortic valve disease     Long-standing systolic murmur  . Dyspnea on exertion     poor exercise tolerance  . Palpitations   . Depression with anxiety   . Fibroids     uterine; postmenopausal bleeding  . Degenerative joint disease     right knee  . COPD (chronic obstructive pulmonary disease)   . Headache   . Depression     Past Surgical History  Procedure Laterality Date  . Tubal ligation    . Abdominal hysterectomy  12/21/2011    Procedure: HYSTERECTOMY ABDOMINAL;  Surgeon: Tilda Burrow, MD;  Location: [facility];  Service: Gynecology;  Laterality: N/A;    Family History  Problem Relation Age of Onset  . Lung cancer Mother   . Heart disease Brother   . Breast cancer Sister   . Heart disease Maternal Aunt   . Anesthesia problems Neg Hx   . Hypotension Neg Hx   . Malignant hyperthermia Neg Hx   . Pseudochol deficiency Neg Hx     History   Substance Use Topics  . Smoking status: Current Every Day Smoker -- 0.25 packs/day for 30 years    Types: Cigars  . Smokeless tobacco: Never Used  . Alcohol Use: No    OB History   Grav Para Term Preterm Abortions TAB SAB Ect Mult Living                  Review of Systems  Constitutional: Negative for fever.  Respiratory: Negative for shortness of breath.   Gastrointestinal: Negative for vomiting, abdominal pain and constipation.  Genitourinary: Negative for dysuria, hematuria, flank pain, decreased urine volume and difficulty urinating.       No perineal numbness or incontinence of urine or feces  Musculoskeletal: Positive for back pain and arthralgias. Negative for joint swelling.  Skin: Negative for rash.  Neurological: Negative for weakness and numbness.  All other systems reviewed and are negative.    Allergies  Aspirin and Penicillins  Home Medications   Current Outpatient Rx  Name  Route  Sig  Dispense  Refill  . acetaminophen (TYLENOL) 500 MG tablet   Oral   Take 1,500 mg by mouth daily as needed.         . cloNIDine (CATAPRES) 0.1 MG tablet  Oral   Take 0.1 mg by mouth 2 (two) times daily.         . DULoxetine (CYMBALTA) 60 MG capsule   Oral   Take 120 mg by mouth daily.          Marland Kitchen esomeprazole (NEXIUM) 40 MG capsule   Oral   Take 40 mg by mouth daily before breakfast.         . estradiol (ESTRACE) 1 MG tablet   Oral   Take 1 mg by mouth daily.         Marland Kitchen gabapentin (NEURONTIN) 100 MG capsule   Oral   Take 100 mg by mouth 3 (three) times daily.         . hydrochlorothiazide (MICROZIDE) 12.5 MG capsule   Oral   Take 12.5 mg by mouth daily.         Marland Kitchen ibuprofen (ADVIL,MOTRIN) 200 MG tablet   Oral   Take 600 mg by mouth every 6 (six) hours as needed. Pain         . lisinopril-hydrochlorothiazide (PRINZIDE,ZESTORETIC) 20-12.5 MG per tablet   Oral   Take 2 tablets by mouth daily.          . pravastatin (PRAVACHOL) 40 MG  tablet   Oral   Take 1 tablet (40 mg total) by mouth every evening.   90 tablet   3   . ranitidine (ZANTAC) 150 MG capsule   Oral   Take 150 mg by mouth daily.         . risperiDONE (RISPERDAL) 0.5 MG tablet   Oral   Take 0.5 mg by mouth 2 (two) times daily.         . sitaGLIPtan-metformin (JANUMET) 50-500 MG per tablet   Oral   Take 1 tablet by mouth 2 (two) times daily with a meal.         . traZODone (DESYREL) 100 MG tablet   Oral   Take 150 mg by mouth at bedtime.         . verapamil (CALAN) 120 MG tablet   Oral   Take 1 tablet (120 mg total) by mouth 2 (two) times daily.   60 tablet   3   . albuterol (PROVENTIL) 90 MCG/ACT inhaler   Inhalation   Inhale 2 puffs into the lungs 4 (four) times daily. Asthmatic Symptoms           BP 136/69  Pulse 85  Temp(Src) 98.1 F (36.7 C) (Oral)  Resp 20  Ht 5\' 7"  (1.702 m)  Wt 303 lb (137.44 kg)  BMI 47.45 kg/m2  SpO2 100%  LMP 10/25/2011  Physical Exam  Nursing note and vitals reviewed. Constitutional: She is oriented to person, place, and time. She appears well-developed and well-nourished. No distress.  Patient is morbidly obese  HENT:  Head: Normocephalic and atraumatic.  Neck: Normal range of motion. Neck supple.  Cardiovascular: Normal rate, regular rhythm, normal heart sounds and intact distal pulses.   No murmur heard. Pulmonary/Chest: Effort normal and breath sounds normal. No respiratory distress. She exhibits no tenderness.  Abdominal: Soft. She exhibits no distension. There is no tenderness. There is no rebound and no guarding.  Musculoskeletal: She exhibits tenderness. She exhibits no edema.       Lumbar back: She exhibits tenderness and pain. She exhibits normal range of motion, no swelling, no deformity, no laceration and normal pulse.  ttp of the right lumbar paraspinal muscles and SI joint.  No spinal tenderness.  DP pulses are brisk and symmetrical.  Distal sensation intact.  Hip  Flexors/Extensors are intact  Neurological: She is alert and oriented to person, place, and time. No cranial nerve deficit or sensory deficit. She exhibits normal muscle tone. Coordination and gait normal.  Reflex Scores:      Patellar reflexes are 2+ on the right side and 2+ on the left side.      Achilles reflexes are 2+ on the right side and 2+ on the left side. Skin: Skin is warm and dry.    ED Course  Procedures (including critical care time)  Labs Reviewed - No data to display Dg Lumbar Spine Complete  04/24/2013   *RADIOLOGY REPORT*  Clinical Data: Chronic back/hip pain  LUMBAR SPINE - COMPLETE 4+ VIEW  Comparison: CT abdomen pelvis dated 08/24/2012  Findings: Five lumbar-type vertebral bodies.  Normal lumbar lordosis.  No evidence of fracture or dislocation.  The vertebral body heights are maintained.  Mild multilevel degenerative changes.  Visualized bony pelvis appears intact.  IMPRESSION: No fracture or dislocation is seen.  Mild degenerative changes.   Original Report Authenticated By: Charline Bills, M.D.   Dg Hip Complete Right  04/24/2013   *RADIOLOGY REPORT*  Clinical Data: Chronic back and right hip pain, no known injury  RIGHT HIP - COMPLETE 2+ VIEW  Comparison: None.  Findings: No fracture or dislocation is seen.  Mild degenerative changes of the bilateral hips.  Visualized bony pelvis appears intact.  Mild degenerative changes of the lower lumbar spine.  IMPRESSION: No fracture or dislocation is seen.  Mild degenerative changes of the bilateral hips.   Original Report Authenticated By: Charline Bills, M.D.        MDM    Patient is feeling better. No focal neuro deficits on exam. Pain is reproduced with straight-leg raise on the right. Pain is likely related to sciatica. I discussed the x-ray results with the patient. She agrees to close followup with her primary care physician at the [facility]. I doubt emergent neurological or infectious process.      Alexes Menchaca  L. Trisha Mangle, PA-C 04/27/13 2225

## 2013-04-29 NOTE — ED Provider Notes (Signed)
Medical screening examination/treatment/procedure(s) were performed by non-physician practitioner and as supervising physician I was immediately available for consultation/collaboration.  Zacharius Funari W. Garek Schuneman, MD 04/29/13 1554 

## 2013-06-12 ENCOUNTER — Other Ambulatory Visit (HOSPITAL_COMMUNITY): Payer: Self-pay | Admitting: Physician Assistant

## 2013-06-12 DIAGNOSIS — Z139 Encounter for screening, unspecified: Secondary | ICD-10-CM

## 2013-06-13 ENCOUNTER — Other Ambulatory Visit (HOSPITAL_COMMUNITY): Payer: Self-pay | Admitting: Physician Assistant

## 2013-06-13 DIAGNOSIS — M549 Dorsalgia, unspecified: Secondary | ICD-10-CM

## 2013-06-18 ENCOUNTER — Encounter (HOSPITAL_COMMUNITY): Payer: Self-pay

## 2013-06-18 ENCOUNTER — Ambulatory Visit (HOSPITAL_COMMUNITY)
Admission: RE | Admit: 2013-06-18 | Discharge: 2013-06-18 | Disposition: A | Discharge status: Home / Self Care | Payer: Self-pay | Source: Ambulatory Visit | Attending: Physician Assistant | Admitting: Physician Assistant

## 2013-06-18 DIAGNOSIS — R209 Unspecified disturbances of skin sensation: Secondary | ICD-10-CM | POA: Insufficient documentation

## 2013-06-18 DIAGNOSIS — M545 Low back pain, unspecified: Secondary | ICD-10-CM | POA: Insufficient documentation

## 2013-06-18 DIAGNOSIS — M549 Dorsalgia, unspecified: Secondary | ICD-10-CM

## 2013-07-16 ENCOUNTER — Ambulatory Visit (HOSPITAL_COMMUNITY): Payer: Self-pay

## 2013-07-18 ENCOUNTER — Ambulatory Visit (INDEPENDENT_AMBULATORY_CARE_PROVIDER_SITE_OTHER): Payer: Self-pay | Admitting: Obstetrics and Gynecology

## 2013-07-18 ENCOUNTER — Encounter: Payer: Self-pay | Admitting: Obstetrics and Gynecology

## 2013-07-18 VITALS — BP 182/88 | Ht 67.0 in | Wt 294.8 lb

## 2013-07-18 DIAGNOSIS — Z1389 Encounter for screening for other disorder: Secondary | ICD-10-CM

## 2013-07-18 DIAGNOSIS — R109 Unspecified abdominal pain: Secondary | ICD-10-CM

## 2013-07-18 DIAGNOSIS — N949 Unspecified condition associated with female genital organs and menstrual cycle: Secondary | ICD-10-CM

## 2013-07-18 LAB — POCT URINALYSIS DIPSTICK
Glucose, UA: NEGATIVE
Nitrite, UA: NEGATIVE

## 2013-07-18 MED ORDER — OXYCODONE-ACETAMINOPHEN 5-325 MG PO TABS: 1.0000 | ORAL_TABLET | ORAL | Status: DC | PRN

## 2013-07-18 NOTE — Progress Notes (Signed)
[facility] Visit  Patient name: Amber Harris MRN [id number]  Date of birth: 1961/04/02  CC & HPI:  Amber Harris is a 52 y.o. female presenting today for eval of central suprapubic pain x 5 days.Began c stepping out of bath. Shot up the length of body. No peripheral radiation. No F,C, no dysuria.   ROS:  ED visit for hand injury (Unrelated to CC) 7/14 On Steroids for bronchitis ([facility]) June 14 x 2 wk.    Pertinent History Reviewed:  Medical & Surgical Hx:  Reviewed: Significant for tahbso 11/2011 Medications: Reviewed & Updated - see associated section Social History: Reviewed -  reports that she has been smoking Cigarettes.  She has a 7.5 pack-year smoking history. She has never used smokeless tobacco.  Objective Findings:  Vitals: BP 182/88  Ht 5\' 7"  (1.702 m)  Wt 294 lb 12.8 oz (133.72 kg)  BMI 46.16 kg/m2  LMP 10/25/2011  Physical Examination: General appearance - overweight, well hydrated, in mild to moderate distress and visibly uncomfortable Mental status - alert, oriented to person, place, and time, normal mood, behavior, speech, dress, motor activity, and thought processes Neck - supple, no significant adenopathy Abdomen - soft, nontender, nondistended, no masses or organomegaly Exam limited by obesity,midline lower abd scar, well healed, no hernia Pelvic - VULVA: normal appearing vulva with no masses, tenderness or lesions, vulvar tenderness inside vagina when pressing on back of s. pubis, VAGINA: normal appearing vagina with normal color and discharge, no lesions, vaginal tenderness see Vulva Extremities - peripheral pulses normal, no pedal edema, no clubbing or cyanosis   Assessment & Plan:   Symphysis pubis pain,  P; nsaids, hydrocodone 10,

## 2013-07-18 NOTE — Patient Instructions (Addendum)
If not releived in 2 wk, will need to consider xrays to evaluate symphysis pubis. Light acivity only;

## 2013-08-01 ENCOUNTER — Ambulatory Visit: Payer: Self-pay | Admitting: Obstetrics and Gynecology

## 2013-08-20 ENCOUNTER — Ambulatory Visit (INDEPENDENT_AMBULATORY_CARE_PROVIDER_SITE_OTHER): Payer: Self-pay | Admitting: Obstetrics and Gynecology

## 2013-08-20 ENCOUNTER — Encounter: Payer: Self-pay | Admitting: Obstetrics and Gynecology

## 2013-08-20 VITALS — BP 194/106 | Ht 66.0 in | Wt 296.4 lb

## 2013-08-20 DIAGNOSIS — R109 Unspecified abdominal pain: Secondary | ICD-10-CM

## 2013-08-20 DIAGNOSIS — Z01818 Encounter for other preprocedural examination: Secondary | ICD-10-CM

## 2013-08-20 MED ORDER — OXYCODONE-ACETAMINOPHEN 10-325 MG PO TABS: 1.0000 | ORAL_TABLET | Freq: Three times a day (TID) | ORAL | Status: DC | PRN

## 2013-08-20 NOTE — Patient Instructions (Addendum)
Remain active. Weight loss is very important at this time. Marland Kitchen

## 2013-08-20 NOTE — Progress Notes (Signed)
[facility] Visit  Patient name: Amber Harris MRN [id number]  Date of birth: December 13, 1960  CC & HPI:  Amber Harris is a 52 y.o. female presenting today for followup of pain in the suprapubic and lower abdomen.. This 52 year old female it is 1+ years status post abdominal hysterectomy for 18 weeks fibroids through a midline lower abdominal incision. Recovery was notable for chronic discomfort in the suprapubic area. This was evaluated and surgical consult obtained in the opinion was and simply incisional discomfort. The pain completely resolved until recent recurrence after she stepped out of the bathtub and experience sharp onset of suprapubic discomfort. Exam one month ago showed the there was no suggestion of hernia, and the question was whether suprapubic origin to the pain. She received she received analgesics, hydrocodone 10. Milligram pills. One month later she is spending lots of her time resting when she can. She does care for her granddaughter, otherwise she rests in bed she is deconditioned.. Discomfort is most when she simply inactive, pain is worse with leaning forward. Bowel movements are fine and do not cause recurrence of this suprapubic pain. The no fever chills no nausea no vomiting.     Patient reports that she thinks she feels "a little worse" compared to the time immediately after surgery when seen by general surgery  ROS:  Denies urinary frequency. Bowel movements are every 3 days considered normal the patient  Pertinent History Reviewed:  Medical & Surgical Hx:  Reviewed: Significant for midline lower abdominal incision 5 2013 with postoperative suprapubic pain similar to this eventually gradually resolved Medications: Reviewed & Updated - see associated section followed by Jacquelin Hawking. She is on blood pressure medicines at significant elevation pressures today Social History: Reviewed -  reports that she has been smoking Cigarettes.  She has a 7.5 pack-year  smoking history. She has never used smokeless tobacco.  Objective Findings:  Vitals: BP 194/106  Ht 5\' 6"  (1.676 m)  Wt 134.446 kg (296 lb 6.4 oz)  BMI 47.86 kg/m2  LMP 10/25/2011 Blood pressure 194/106  Physical Examination: General appearance - oriented to person, place, and time, overweight, well hydrated, in mild to moderate distress and uncomfortable and limping slightly with movement Mental status - alert, oriented to person, place, and time, somber arffect Eyes - pupils equal and reactive, extraocular eye movements intact Abdomen - soft, nontender, nondistended, no masses or organomegaly incisions clean without bulging hernia or  identifiable defect patient gives the impression of significant discomfort with palpation no guarding and no rebound.  bowel sounds normal hepatomegaly negative splenomegaly RECTAL EXAM: negative without mass, lesions or tenderness Pelvic - normal external genitalia, vulva, vagina, cervix, uterus and adnexa, VULVA: normal appearing vulva with no masses, tenderness or lesions, VAGINA: normal appearing vagina with normal color and discharge, no lesions, ADNEXA: normal adnexa in size, nontender and no masses   Assessment & Plan:   Chronic abdominal pain status post hysterectomy through midline incision no identifiable hernia Diabetes mellitus suboptimal control on Janumet  obesity BMI greater than 40 Uncontrolled hypertension Hypercholesterolemia    Plan: 1 patient cannot use abdominal binder as it increases the pain 2.incisional discomfort, worsened by obesity BMI greater than 45 3.markedly deconditioned.  Uncontrolled hypertension Plan 1.renew anangesicsl 2 patient declines abdominal binder due to discomfort concerns 3. Will reviewed Percocet 10x60 tablets followup 4 weeks 4. Patient to followup with [facility] for blood pressure control

## 2013-08-21 LAB — CREATININE, SERUM: Creat: 0.72 mg/dL (ref 0.50–1.10)

## 2013-08-22 ENCOUNTER — Ambulatory Visit (HOSPITAL_COMMUNITY)
Admission: RE | Admit: 2013-08-22 | Discharge: 2013-08-22 | Disposition: A | Discharge status: Home / Self Care | Payer: Self-pay | Source: Ambulatory Visit | Attending: Obstetrics and Gynecology | Admitting: Obstetrics and Gynecology

## 2013-08-22 DIAGNOSIS — E278 Other specified disorders of adrenal gland: Secondary | ICD-10-CM | POA: Insufficient documentation

## 2013-08-22 DIAGNOSIS — R109 Unspecified abdominal pain: Secondary | ICD-10-CM | POA: Insufficient documentation

## 2013-08-22 MED ORDER — IOHEXOL 300 MG/ML  SOLN
100.0000 mL | Freq: Once | INTRAMUSCULAR | Status: AC | PRN
  Administered 2013-08-22: 100 mL via INTRAVENOUS

## 2013-09-19 ENCOUNTER — Ambulatory Visit (INDEPENDENT_AMBULATORY_CARE_PROVIDER_SITE_OTHER): Payer: Self-pay | Admitting: Obstetrics and Gynecology

## 2013-09-19 VITALS — BP 162/92 | Ht 66.0 in | Wt 294.0 lb

## 2013-09-19 DIAGNOSIS — N949 Unspecified condition associated with female genital organs and menstrual cycle: Secondary | ICD-10-CM

## 2013-09-19 DIAGNOSIS — N23 Unspecified renal colic: Secondary | ICD-10-CM

## 2013-09-19 DIAGNOSIS — N950 Postmenopausal bleeding: Secondary | ICD-10-CM

## 2013-09-19 DIAGNOSIS — R319 Hematuria, unspecified: Secondary | ICD-10-CM

## 2013-09-19 LAB — POCT URINALYSIS DIPSTICK
Leukocytes, UA: NEGATIVE
Nitrite, UA: NEGATIVE
Protein, UA: NEGATIVE

## 2013-09-19 MED ORDER — OXYCODONE-ACETAMINOPHEN 5-325 MG PO TABS: 1.0000 | ORAL_TABLET | ORAL | Status: DC | PRN

## 2013-09-19 NOTE — Patient Instructions (Signed)
Call regarding urine culture 4-6 days

## 2013-09-19 NOTE — Progress Notes (Signed)
Patient ID: Amber Harris, female   DOB: 1961-02-28, 52 y.o.   MRN: [id number] Pt states that the pain in the pelvic area seems to have eased up some but that the pain in her side is worse. Pt states that she is now having to sleep in her recliner to be comfortable. Rocking makes it better. Pt stated that she had blood in urine this morning when she voided, a urine was checked and she had plus 3 blood. Physical Examination: General appearance - ill-appearing Abdomen - soft, nontender, nondistended, no masses or organomegaly Back exam - positive straight-leg raise on right, slight,  Had negative CT in August no stone seen Neurological - alert, oriented, normal speech, no focal findings or movement disorder noted, DTR's normal and symmetric Extremities - peripheral pulses normal, no pedal edema, no clubbing or cyanosis, Homan's sign negative bilaterally  U/a pos for blood only  Imp: hx c/w chronic  kidneystone but CT negative in August. Plan : Rx oxycodone 10 x 60 tabs             Urine c&s            Renal ultrasound ordered

## 2013-10-04 ENCOUNTER — Ambulatory Visit: Payer: Self-pay | Admitting: Cardiology

## 2013-10-17 ENCOUNTER — Telehealth: Payer: Self-pay | Admitting: Obstetrics and Gynecology

## 2013-10-17 ENCOUNTER — Encounter: Payer: Self-pay | Admitting: Obstetrics and Gynecology

## 2013-10-17 ENCOUNTER — Ambulatory Visit (INDEPENDENT_AMBULATORY_CARE_PROVIDER_SITE_OTHER): Payer: Self-pay | Admitting: Obstetrics and Gynecology

## 2013-10-17 VITALS — BP 148/78 | Ht 66.0 in | Wt 294.0 lb

## 2013-10-17 DIAGNOSIS — Z90710 Acquired absence of both cervix and uterus: Secondary | ICD-10-CM

## 2013-10-17 DIAGNOSIS — R1031 Right lower quadrant pain: Secondary | ICD-10-CM

## 2013-10-17 DIAGNOSIS — R109 Unspecified abdominal pain: Secondary | ICD-10-CM

## 2013-10-17 DIAGNOSIS — N898 Other specified noninflammatory disorders of vagina: Secondary | ICD-10-CM

## 2013-10-17 LAB — POCT WET PREP (WET MOUNT)
Bacteria Wet Prep HPF POC: NEGATIVE
Clue Cells Wet Prep Whiff POC: NEGATIVE
KOH Wet Prep POC: NEGATIVE
Source Wet Prep POC: NEGATIVE
Trichomonas Wet Prep HPF POC: NEGATIVE
WBC, Wet Prep HPF POC: NEGATIVE

## 2013-10-17 LAB — POCT URINALYSIS DIPSTICK
Glucose, UA: NEGATIVE
Ketones, UA: NEGATIVE
Leukocytes, UA: NEGATIVE
Nitrite, UA: NEGATIVE
Protein, UA: NEGATIVE

## 2013-10-17 MED ORDER — METRONIDAZOLE 500 MG PO TABS: 500.0000 mg | ORAL_TABLET | Freq: Two times a day (BID) | ORAL | Status: DC

## 2013-10-17 NOTE — Progress Notes (Signed)
[facility] Visit  Patient name: Amber Harris MRN [id number]  Date of birth: [birth year]  CC & HPI:  Amber Harris is a 52 y.o. female presenting today for  Blood in urine, hurts to pee. Also pain in rt side unchanged from last few visits.  Pain is below pannus, above inguinal crease, ?radiates down leg. Pain is daily, wakes pt, constant.  Pt is s/p hysterectomy for 18 wk fibroids due to abd pain related to fibroids, 11/2011, had greater than normal pain complaints postop, had negative Gen Surg consult postop, was functioning normally, =then began to have central suprapubic pain  Mid August 2014, Which pt related to acute onset beginning upon stepping out of bath , described in notes of 07/18/13 visit as shooting upward throughout entire body, without peripheral radiation.     ROS:  Gi: - nausea, - vomit no bm in  4 days, eating less.      - no wt loss. GU-  Urinary hesitancy. Voids 2x/d    Pertinent History Reviewed:  Medical & Surgical Hx:  Reviewed: Significant for [facility]. Medications: Reviewed & Updated - see associated section Social History: Reviewed -  reports that she has been smoking Cigarettes.  She has a 7.5 pack-year smoking history. She has never used smokeless tobacco.  Objective Findings:  Vitals: BP 148/78  Ht 5\' 6"  (1.676 m)  Wt 294 lb (133.358 kg)  BMI 47.48 kg/m2  LMP 10/25/2011  Physical Examination: General appearance - alert, well appearing, and in no distress and in mild to moderate distress. Rocks front-back continuously Mental status - alert, oriented to person, place, and time, normal mood, behavior, speech, dress, motor activity, and thought processes Abdomen - no inguinal adenopathy . Pt describes pain as along a line above the inguinal crease from symphysis pubis to posterior superior iliac crest no hernias noted, no masses.  Pain perceived when abd wall tight and internal organs not being compressed Pelvic: limited by  anxiety,and body habitus.             Cannot feel internal organs. Bimanual hand does NOT reproduce the pain             Wet prep clue cells only   Assessment & Plan:   Chronic rlq pain, not gyn etiology BV S/p hysterectomy January 2014 for uterine fibroids 18 wk size.

## 2013-10-17 NOTE — Patient Instructions (Signed)
Bacterial Vaginosis Bacterial vaginosis (BV) is a vaginal infection where the normal balance of bacteria in the vagina is disrupted. The normal balance is then replaced by an overgrowth of certain bacteria. There are several different kinds of bacteria that can cause BV. BV is the most common vaginal infection in women of childbearing age. CAUSES   The cause of BV is not fully understood. BV develops when there is an increase or imbalance of harmful bacteria.  Some activities or behaviors can upset the normal balance of bacteria in the vagina and put women at increased risk including:  Having a new sex partner or multiple sex partners.  Douching.  Using an intrauterine device (IUD) for contraception.  It is not clear what role sexual activity plays in the development of BV. However, women that have never had sexual intercourse are rarely infected with BV. Women do not get BV from toilet seats, bedding, swimming pools or from touching objects around them.  SYMPTOMS   Grey vaginal discharge.  A fish-like odor with discharge, especially after sexual intercourse.  Itching or burning of the vagina and vulva.  Burning or pain with urination.  Some women have no signs or symptoms at all. DIAGNOSIS  Your caregiver must examine the vagina for signs of BV. Your caregiver will perform lab tests and look at the sample of vaginal fluid through a microscope. They will look for bacteria and abnormal cells (clue cells), a pH test higher than 4.5, and a positive amine test all associated with BV.  RISKS AND COMPLICATIONS   Pelvic inflammatory disease (PID).  Infections following gynecology surgery.  Developing HIV.  Developing herpes virus. TREATMENT  Sometimes BV will clear up without treatment. However, all women with symptoms of BV should be treated to avoid complications, especially if gynecology surgery is planned. Female partners generally do not need to be treated. However, BV may spread  between female sex partners so treatment is helpful in preventing a recurrence of BV.   BV may be treated with antibiotics. The antibiotics come in either pill or vaginal cream forms. Either can be used with nonpregnant or pregnant women, but the recommended dosages differ. These antibiotics are not harmful to the baby.  BV can recur after treatment. If this happens, a second round of antibiotics will often be prescribed.  Treatment is important for pregnant women. If not treated, BV can cause a premature delivery, especially for a pregnant woman who had a premature birth in the past. All pregnant women who have symptoms of BV should be checked and treated.  For chronic reoccurrence of BV, treatment with a type of prescribed gel vaginally twice a week is helpful. HOME CARE INSTRUCTIONS   Finish all medication as directed by your caregiver.  Do not have sex until treatment is completed.  Tell your sexual partner that you have a vaginal infection. They should see their caregiver and be treated if they have problems, such as a mild rash or itching.  Practice safe sex. Use condoms. Only have 1 sex partner. PREVENTION  Basic prevention steps can help reduce the risk of upsetting the natural balance of bacteria in the vagina and developing BV:  Do not have sexual intercourse (be abstinent).  Do not douche.  Use all of the medicine prescribed for treatment of BV, even if the signs and symptoms go away.  Tell your sex partner if you have BV. That way, they can be treated, if needed, to prevent reoccurrence. SEEK MEDICAL CARE IF:     Your symptoms are not improving after 3 days of treatment.  You have increased discharge, pain, or fever. MAKE SURE YOU:   Understand these instructions.  Will watch your condition.  Will get help right away if you are not doing well or get worse. FOR MORE INFORMATION  Division of STD Prevention (DSTDP), Centers for Disease Control and Prevention:  www.cdc.gov/std American Social Health Association (ASHA): www.ashastd.org  Document Released: 11/15/2005 Document Revised: 02/07/2012 Document Reviewed: 06/27/2013 [employer] Patient Information 2014 [employer].  

## 2013-10-18 NOTE — Telephone Encounter (Signed)
Pt informed per Dr. Emelda Fear, RX for Oxycodone will be left at front desk for pt to pick up tomorrow morning.

## 2013-10-19 MED ORDER — OXYCODONE-ACETAMINOPHEN 5-325 MG PO TABS: 1.0000 | ORAL_TABLET | ORAL | Status: DC | PRN

## 2013-10-19 NOTE — Addendum Note (Signed)
Addended by: Tilda Burrow on: 10/19/2013 09:54 AM   Modules accepted: Orders

## 2013-10-19 NOTE — Telephone Encounter (Signed)
Refilled Rx for Oxycodone x 90 tabs.  Patient will in future need to see primary care. Pt here to pick up rx

## 2013-11-14 ENCOUNTER — Encounter: Payer: Self-pay | Admitting: Cardiology

## 2013-11-14 ENCOUNTER — Ambulatory Visit (INDEPENDENT_AMBULATORY_CARE_PROVIDER_SITE_OTHER): Payer: Self-pay | Admitting: Cardiology

## 2013-11-14 VITALS — BP 154/75 | HR 66 | Ht 66.0 in | Wt 297.2 lb

## 2013-11-14 DIAGNOSIS — I340 Nonrheumatic mitral (valve) insufficiency: Secondary | ICD-10-CM

## 2013-11-14 DIAGNOSIS — E785 Hyperlipidemia, unspecified: Secondary | ICD-10-CM

## 2013-11-14 DIAGNOSIS — I059 Rheumatic mitral valve disease, unspecified: Secondary | ICD-10-CM

## 2013-11-14 DIAGNOSIS — R42 Dizziness and giddiness: Secondary | ICD-10-CM

## 2013-11-14 DIAGNOSIS — I10 Essential (primary) hypertension: Secondary | ICD-10-CM

## 2013-11-14 MED ORDER — VERAPAMIL HCL 80 MG PO TABS: 80.0000 mg | ORAL_TABLET | Freq: Three times a day (TID) | ORAL | Status: DC

## 2013-11-14 NOTE — Patient Instructions (Addendum)
Your physician recommends that you schedule a follow-up appointment in: 2 months  Your physician has requested that you have an echocardiogram. Echocardiography is a painless test that uses sound waves to create images of your heart. It provides your doctor with information about the size and shape of your heart and how well your heart's chambers and valves are working. This procedure takes approximately one hour. There are no restrictions for this procedure.  WE WILL CALL YOU WITH YOUR TEST RESULTS/INSTRUCTIONS/NEXT STEPS ONCE RECEIVED BY THE PROVIDER  PLEASE BE ADVISED YOU WILL STILL NEED TO KEEP YOUR FOLLOW UP APPOINTMENT TO DISCUSS FURTHER DETAILS OF YOUR TEST RESULTS/NEXT STEPS/FUTURE PLAN OF CARE WITH YOUR PROVIDER DESPITE THE FACT THAT YOU MAY HAVE NORMAL TEST RESULTS   Your physician has recommended you make the following change in your medication:  1) DECREASE YOUR VERAPAMIL TO 80MG  THREE TIMES DAILY

## 2013-11-14 NOTE — Progress Notes (Signed)
Clinical Summary Amber Harris is a 52 y.o.female former patient of Dr Dietrich Pates, this is our first visit together. She was seen for the following medical problems.  1. Valvular heart disease - mild AI and MR noted on prior echo 12/2009 - describes some SOB as well as LE edema. No orthopnea or PND  2. HTN - does not check at home - compliant with meds  3. Hyperlipidemia - compliant with crestor  4. Dizziness - can occur at rest or with exertion. Not postural - Feeling of room spinning. Can have feeling of palpitations for a few beats as well.  Describes feeling daily, with increasing frequency. She has worn 2 previous heart monitors with symptoms not corresponding to any arrhythmias.  - denies soda or tea, drinks 2 pots of decaf coffee daily   Past Medical History  Diagnosis Date  . Syncope     exertional  . Diabetes mellitus   . Hypertension   . Obesity   . Asthma     Uses p.r.n. albuterol  . Tobacco user     30 pack years; 04/2011: 1/4 pack per day during quick attempt  . Aortic valve disease     Long-standing systolic murmur  . Dyspnea on exertion     poor exercise tolerance  . Palpitations   . Depression with anxiety   . Fibroids     uterine; postmenopausal bleeding  . Degenerative joint disease     right knee  . COPD (chronic obstructive pulmonary disease)   . Headache(784.0)   . Depression   . Bronchitis      Allergies  Allergen Reactions  . Aspirin Hives  . Penicillins Rash     Current Outpatient Prescriptions  Medication Sig Dispense Refill  . acetaminophen (TYLENOL) 500 MG tablet Take 1,500 mg by mouth daily as needed.      Marland Kitchen albuterol (PROVENTIL) (5 MG/ML) 0.5% nebulizer solution Take 2.5 mg by nebulization 3 (three) times daily.      Marland Kitchen albuterol (PROVENTIL) 90 MCG/ACT inhaler Inhale 2 puffs into the lungs 4 (four) times daily. Asthmatic Symptoms      . cloNIDine (CATAPRES) 0.1 MG tablet Take 0.1 mg by mouth 2 (two) times daily.      .  cyclobenzaprine (FLEXERIL) 10 MG tablet Take 1 tablet (10 mg total) by mouth 3 (three) times daily as needed for muscle spasms.  21 tablet  0  . DULoxetine (CYMBALTA) 60 MG capsule Take 120 mg by mouth daily.       Marland Kitchen esomeprazole (NEXIUM) 40 MG capsule Take 40 mg by mouth daily before breakfast.      . estradiol (ESTRACE) 1 MG tablet Take 1 mg by mouth daily.      Marland Kitchen gabapentin (NEURONTIN) 100 MG capsule Take 100 mg by mouth 3 (three) times daily.      . hydrochlorothiazide (MICROZIDE) 12.5 MG capsule Take 12.5 mg by mouth daily.      Marland Kitchen ibuprofen (ADVIL,MOTRIN) 200 MG tablet Take 600 mg by mouth every 6 (six) hours as needed. Pain      . lisinopril (PRINIVIL,ZESTRIL) 20 MG tablet Take 20 mg by mouth 2 (two) times daily.      Marland Kitchen lisinopril-hydrochlorothiazide (PRINZIDE,ZESTORETIC) 20-12.5 MG per tablet Take 2 tablets by mouth daily.       . metroNIDAZOLE (FLAGYL) 500 MG tablet Take 1 tablet (500 mg total) by mouth 2 (two) times daily.  14 tablet  0  . oxyCODONE-acetaminophen (ROXICET) 5-325 MG per  tablet Take 1 tablet by mouth every 4 (four) hours as needed.  90 tablet  0  . risperiDONE (RISPERDAL) 0.5 MG tablet Take 0.5 mg by mouth 2 (two) times daily.      . rosuvastatin (CRESTOR) 20 MG tablet Take 20 mg by mouth daily.      . sitaGLIPtan-metformin (JANUMET) 50-500 MG per tablet Take 1 tablet by mouth 2 (two) times daily with a meal.      . traZODone (DESYREL) 100 MG tablet Take 150 mg by mouth at bedtime.      . verapamil (CALAN) 120 MG tablet Take 1 tablet (120 mg total) by mouth 2 (two) times daily.  60 tablet  3   No current facility-administered medications for this visit.     Past Surgical History  Procedure Laterality Date  . Tubal ligation    . Abdominal hysterectomy  12/21/2011    Procedure: HYSTERECTOMY ABDOMINAL;  Surgeon: Tilda Burrow, MD;  Location: [facility];  Service: Gynecology;  Laterality: N/A;     Allergies  Allergen Reactions  . Aspirin Hives  . Penicillins Rash       Family History  Problem Relation Age of Onset  . Lung cancer Mother   . Heart disease Brother   . Breast cancer Sister   . Heart disease Maternal Aunt   . Anesthesia problems Neg Hx   . Hypotension Neg Hx   . Malignant hyperthermia Neg Hx   . Pseudochol deficiency Neg Hx   . Cancer Cousin     lung     Social History Amber Harris reports that she has been smoking Cigarettes.  She has a 7.5 pack-year smoking history. She has never used smokeless tobacco. Amber Harris reports that she does not drink alcohol.   Review of Systems CONSTITUTIONAL: No weight loss, fever, chills, weakness or fatigue.  HEENT: Eyes: No visual loss, blurred vision, double vision or yellow sclerae.No hearing loss, sneezing, congestion, runny nose or sore throat.  SKIN: No rash or itching.  CARDIOVASCULAR: per HPI RESPIRATORY: No shortness of breath, cough or sputum.  GASTROINTESTINAL: No anorexia, nausea, vomiting or diarrhea. No abdominal pain or blood.  GENITOURINARY: No burning on urination, no polyuria NEUROLOGICAL: dizziness MUSCULOSKELETAL: No muscle, back pain, joint pain or stiffness.  LYMPHATICS: No enlarged nodes. No history of splenectomy.  PSYCHIATRIC: No history of depression or anxiety.  ENDOCRINOLOGIC: No reports of sweating, cold or heat intolerance. No polyuria or polydipsia.  Marland Kitchen   Physical Examination p 66 bp 154/75 Wt 297 lbs BMI 48 Gen: resting comfortably, no acute distress HEENT: no scleral icterus, pupils equal round and reactive, no palptable cervical adenopathy,  CV: Exam limited due to body habitus. RRR, no m/r,g no JVD, no carotid bruits.  Resp: Clear to auscultation bilaterally GI: abdomen is soft, non-tender, non-distended, normal bowel sounds, no hepatosplenomegaly MSK: extremities are warm, no edema.  Skin: warm, no rash Neuro:  no focal deficits Psych: appropriate affect   Diagnostic Studies 10/2012 Event monitor: no arrhythmias  08/2010 Echo:  Left  ventricle: The cavity size was normal. Wall thickness was   normal. Systolic function was normal. The estimated ejection   fraction was in the range of 60% to 65%. Wall motion was normal;   there were no regional wall motion abnormalities. The study is not   technically sufficient to allow evaluation of LV diastolic   function. - Aortic valve: Mildly calcified annulus. Mild regurgitation. - Mitral valve: Mild regurgitation. - Tricuspid valve: Mild regurgitation. -  Pericardium, extracardiac: There was no pericardial effusion.   11/14/13 Clinic EKG Sinus rhythm  Assessment and Plan  1. Valvular heart disease - mild AI and MR from prior echo 2011. Exam limited by body habitus, difficult to hear murmurs. Has had some increased SOB and LE edema over the last several months - repeat echo  2. HTN - not at goal given hisotry of DM, goal <130/80 - increase verapamil to 80mg  tid  3. Hyperlipidemia - will request lipid [facility]  4. Dizziness - often associated with palpitations, prior event monitors have not show arrhythmias. Does have occasional PACs - will try increasing verapamil to see if affects symptoms  Follow up 2 months      Antoine Poche, M.D., F.A.C.C.

## 2013-11-26 ENCOUNTER — Ambulatory Visit (HOSPITAL_COMMUNITY)
Admission: RE | Admit: 2013-11-26 | Discharge: 2013-11-26 | Disposition: A | Discharge status: Home / Self Care | Payer: Self-pay | Source: Ambulatory Visit | Attending: Cardiology | Admitting: Cardiology

## 2013-11-26 DIAGNOSIS — E785 Hyperlipidemia, unspecified: Secondary | ICD-10-CM | POA: Insufficient documentation

## 2013-11-26 DIAGNOSIS — I519 Heart disease, unspecified: Secondary | ICD-10-CM | POA: Insufficient documentation

## 2013-11-26 DIAGNOSIS — R011 Cardiac murmur, unspecified: Secondary | ICD-10-CM | POA: Insufficient documentation

## 2013-11-26 DIAGNOSIS — E119 Type 2 diabetes mellitus without complications: Secondary | ICD-10-CM | POA: Insufficient documentation

## 2013-11-26 DIAGNOSIS — J4489 Other specified chronic obstructive pulmonary disease: Secondary | ICD-10-CM | POA: Insufficient documentation

## 2013-11-26 DIAGNOSIS — I359 Nonrheumatic aortic valve disorder, unspecified: Secondary | ICD-10-CM

## 2013-11-26 DIAGNOSIS — E669 Obesity, unspecified: Secondary | ICD-10-CM | POA: Insufficient documentation

## 2013-11-26 DIAGNOSIS — Z6841 Body Mass Index (BMI) 40.0 and over, adult: Secondary | ICD-10-CM | POA: Insufficient documentation

## 2013-11-26 DIAGNOSIS — I10 Essential (primary) hypertension: Secondary | ICD-10-CM | POA: Insufficient documentation

## 2013-11-26 DIAGNOSIS — I059 Rheumatic mitral valve disease, unspecified: Secondary | ICD-10-CM | POA: Insufficient documentation

## 2013-11-26 DIAGNOSIS — J449 Chronic obstructive pulmonary disease, unspecified: Secondary | ICD-10-CM | POA: Insufficient documentation

## 2013-11-26 DIAGNOSIS — I517 Cardiomegaly: Secondary | ICD-10-CM | POA: Insufficient documentation

## 2013-11-26 DIAGNOSIS — I340 Nonrheumatic mitral (valve) insufficiency: Secondary | ICD-10-CM

## 2013-11-26 DIAGNOSIS — R002 Palpitations: Secondary | ICD-10-CM | POA: Insufficient documentation

## 2013-11-26 NOTE — Progress Notes (Signed)
*  PRELIMINARY RESULTS* Echocardiogram 2D Echocardiogram has been performed.  Amber Harris 11/26/2013, 1:35 PM

## 2013-12-04 ENCOUNTER — Encounter: Payer: Self-pay | Admitting: Cardiology

## 2014-01-23 ENCOUNTER — Encounter: Payer: Self-pay | Admitting: Cardiology

## 2014-01-23 ENCOUNTER — Ambulatory Visit (INDEPENDENT_AMBULATORY_CARE_PROVIDER_SITE_OTHER): Payer: Self-pay | Admitting: Cardiology

## 2014-01-23 VITALS — BP 128/84 | HR 70 | Ht 67.0 in | Wt 296.0 lb

## 2014-01-23 DIAGNOSIS — R079 Chest pain, unspecified: Secondary | ICD-10-CM

## 2014-01-23 DIAGNOSIS — E785 Hyperlipidemia, unspecified: Secondary | ICD-10-CM

## 2014-01-23 DIAGNOSIS — I10 Essential (primary) hypertension: Secondary | ICD-10-CM

## 2014-01-23 DIAGNOSIS — I5032 Chronic diastolic (congestive) heart failure: Secondary | ICD-10-CM

## 2014-01-23 MED ORDER — FUROSEMIDE 20 MG PO TABS: 20.0000 mg | ORAL_TABLET | Freq: Two times a day (BID) | ORAL | Status: DC

## 2014-01-23 NOTE — Progress Notes (Signed)
Clinical Summary Ms. Hailu is a 53 y.o.female seen today for follow up of the following medical problems.   1. Valvular heart disease  - mild AI and MR noted on prior echo 12/2009  - describes some SOB as well as LE edema. No orthopnea or PND  - recent repeat echo shows stable mild AI and MR.  2. HTN  - does not check at home  - compliant with meds   3. Hyperlipidemia  - compliant with crestor  - 08/2013 TC 180 TG 124 HDL 55 LDL 10  4. Diastolic dysfunction - recent echo shows grade II diastolic dysfunction - notes + LE edema. Uses 2 pillows more for back comfort. + DOE < 1/2 block, stable over 1 year. - takes ibuprofen qday.  - takes HCTZ 12.5 mg bid  5. Noncardiac hest pain - pleuritic chest pain, worst with deep breaths. Located midchest, sharp pain 6/10. Constant x 48 hours. Denies any known instigating event. Mild nausea and vomiting, no other associated symptoms. Worst with pressing on area.   Past Medical History  Diagnosis Date  . Syncope     exertional  . Diabetes mellitus   . Hypertension   . Obesity   . Asthma     Uses p.r.n. albuterol  . Tobacco user     30 pack years; 04/2011: 1/4 pack per day during quick attempt  . Aortic valve disease     Long-standing systolic murmur  . Dyspnea on exertion     poor exercise tolerance  . Palpitations   . Depression with anxiety   . Fibroids     uterine; postmenopausal bleeding  . Degenerative joint disease     right knee  . COPD (chronic obstructive pulmonary disease)   . Headache(784.0)   . Depression   . Bronchitis      Allergies  Allergen Reactions  . Aspirin Hives  . Penicillins Rash     Current Outpatient Prescriptions  Medication Sig Dispense Refill  . acetaminophen (TYLENOL) 500 MG tablet Take 1,500 mg by mouth daily as needed.      Marland Kitchen albuterol (PROVENTIL) (5 MG/ML) 0.5% nebulizer solution Take 2.5 mg by nebulization 3 (three) times daily.      Marland Kitchen albuterol (PROVENTIL) 90 MCG/ACT inhaler  Inhale 2 puffs into the lungs 4 (four) times daily. Asthmatic Symptoms      . cloNIDine (CATAPRES) 0.1 MG tablet Take 0.1 mg by mouth 2 (two) times daily.      . DULoxetine (CYMBALTA) 60 MG capsule Take 120 mg by mouth daily.       Marland Kitchen esomeprazole (NEXIUM) 40 MG capsule Take 40 mg by mouth daily before breakfast.      . estradiol (ESTRACE) 1 MG tablet Take 1 mg by mouth daily.      Marland Kitchen gabapentin (NEURONTIN) 100 MG capsule Take 100 mg by mouth 3 (three) times daily.      . hydrochlorothiazide (MICROZIDE) 12.5 MG capsule Take 12.5 mg by mouth 2 (two) times daily.       Marland Kitchen ibuprofen (ADVIL,MOTRIN) 200 MG tablet Take 600 mg by mouth every 6 (six) hours as needed. Pain      . lisinopril (PRINIVIL,ZESTRIL) 20 MG tablet Take 20 mg by mouth 2 (two) times daily.      . risperiDONE (RISPERDAL) 0.5 MG tablet Take 0.5 mg by mouth 2 (two) times daily.      . rosuvastatin (CRESTOR) 20 MG tablet Take 20 mg by mouth daily.      Marland Kitchen  sitaGLIPtan-metformin (JANUMET) 50-500 MG per tablet Take 1 tablet by mouth 2 (two) times daily with a meal.      . traZODone (DESYREL) 100 MG tablet Take 200 mg by mouth at bedtime.       . verapamil (CALAN) 80 MG tablet Take 1 tablet (80 mg total) by mouth 3 (three) times daily.  90 tablet  6   No current facility-administered medications for this visit.     Past Surgical History  Procedure Laterality Date  . Tubal ligation    . Abdominal hysterectomy  12/21/2011    Procedure: HYSTERECTOMY ABDOMINAL;  Surgeon: Jonnie Kind, MD;  Location: [facility];  Service: Gynecology;  Laterality: N/A;     Allergies  Allergen Reactions  . Aspirin Hives  . Penicillins Rash      Family History  Problem Relation Age of Onset  . Lung cancer Mother   . Heart disease Brother   . Breast cancer Sister   . Heart disease Maternal Aunt   . Anesthesia problems Neg Hx   . Hypotension Neg Hx   . Malignant hyperthermia Neg Hx   . Pseudochol deficiency Neg Hx   . Cancer Cousin     lung      Social History Ms. Spong reports that she has been smoking Cigarettes.  She has a 7.5 pack-year smoking history. She has never used smokeless tobacco. Ms. Dinunzio reports that she does not drink alcohol.   Review of Systems CONSTITUTIONAL: No weight loss, fever, chills, weakness or fatigue.  HEENT: Eyes: No visual loss, blurred vision, double vision or yellow sclerae.No hearing loss, sneezing, congestion, runny nose or sore throat.  SKIN: No rash or itching.  CARDIOVASCULAR: per HPI RESPIRATORY: per HPI GASTROINTESTINAL: No anorexia, nausea, vomiting or diarrhea. No abdominal pain or blood.  GENITOURINARY: No burning on urination, no polyuria NEUROLOGICAL: No headache, dizziness, syncope, paralysis, ataxia, numbness or tingling in the extremities. No change in bowel or bladder control.  MUSCULOSKELETAL: No muscle, back pain, joint pain or stiffness.  LYMPHATICS: No enlarged nodes. No history of splenectomy.  PSYCHIATRIC: No history of depression or anxiety.  ENDOCRINOLOGIC: No reports of sweating, cold or heat intolerance. No polyuria or polydipsia.  Marland Kitchen   Physical Examination p 70 bp 128/84 Wt 296 lbs BMI 46 Gen: resting comfortably, no acute distress HEENT: no scleral icterus, pupils equal round and reactive, no palptable cervical adenopathy,  CV: RRR, no m/r/g,no JVD, no carotid bruits.  Resp: Clear to auscultation bilaterally GI: abdomen is soft, non-tender, non-distended, normal bowel sounds, no hepatosplenomegaly MSK: extremities are warm, 1-2+ bilateral edema. Chestwall tender to palpation.  Skin: warm, no rash Neuro:  no focal deficits Psych: appropriate affect   Diagnostic Studies 10/2012 Event monitor: no arrhythmias   08/2010 Echo:  Left ventricle: The cavity size was normal. Wall thickness was  normal. Systolic function was normal. The estimated ejection  fraction was in the range of 60% to 65%. Wall motion was normal;  there were no regional wall motion  abnormalities. The study is not  technically sufficient to allow evaluation of LV diastolic  function.  - Aortic valve: Mildly calcified annulus. Mild regurgitation.  - Mitral valve: Mild regurgitation.  - Tricuspid valve: Mild regurgitation.  - Pericardium, extracardiac: There was no pericardial effusion.   11/14/13 Clinic EKG  Sinus rhythm   10/2013 Echo LVEF 60-65%, grade II diastolic dysfunction, mild AI, mild MR  01/23/14 Clinic EKG NSR, LAE, no ischemic changes  Assessment and Plan  1.  Valvular heart disease  - mild AI and MR from prior echo 2011. Exam limited by body habitus, difficult to hear murmurs.  - repeat echo shows stable disease, continue to follow  2. HTN  - at goal, continue current meds  3. Hyperlipidemia  - will request lipid [facility]   4. Chronic diastolic heart failure - evidence of volume overload today, will change HCTZ to lasix 20mg  bid  5. Noncardiac chest pain - pleuritic in description nature, reproducible with palpation. EKG in clinic today without any ischemic changes - give course of NSAIDs, 400mg  tid x 5 days.    F/u 4 monhts   Arnoldo Lenis, M.D., F.A.C.C.

## 2014-01-23 NOTE — Patient Instructions (Signed)
Your physician recommends that you schedule a follow-up appointment in: [place] physician has recommended you make the following change in your medication:   1) STOP TAKING HYDROCHLOROTHIAZIDE  2) START TAKING LASIX 20MG  TWICE DAILY 3) TAKE IBUPROFEN 400MG  THREE TIMES DAILY FOR THE NEXT FIVE DAYS THEN RESUME IBUPROFEN 400MG  ONCE DAILY

## 2014-01-25 ENCOUNTER — Encounter: Payer: Self-pay | Admitting: Cardiology

## 2014-02-07 ENCOUNTER — Encounter: Payer: Self-pay | Admitting: Cardiology

## 2014-05-17 ENCOUNTER — Ambulatory Visit: Payer: Self-pay | Admitting: Cardiology

## 2014-05-21 ENCOUNTER — Encounter: Payer: Self-pay | Admitting: Cardiology

## 2014-05-21 ENCOUNTER — Ambulatory Visit (INDEPENDENT_AMBULATORY_CARE_PROVIDER_SITE_OTHER): Payer: Self-pay | Admitting: Cardiology

## 2014-05-21 VITALS — BP 175/92 | HR 75 | Ht 67.0 in | Wt 300.0 lb

## 2014-05-21 DIAGNOSIS — I5032 Chronic diastolic (congestive) heart failure: Secondary | ICD-10-CM

## 2014-05-21 DIAGNOSIS — E785 Hyperlipidemia, unspecified: Secondary | ICD-10-CM

## 2014-05-21 DIAGNOSIS — I10 Essential (primary) hypertension: Secondary | ICD-10-CM

## 2014-05-21 MED ORDER — FUROSEMIDE 20 MG PO TABS: ORAL_TABLET | ORAL | Status: DC

## 2014-05-21 NOTE — Progress Notes (Signed)
Clinical Summary [name] is a 53 y.o.female seen today for follow up.   1. Valvular heart disease  - mild AI and MR noted on prior echo 12/2009  - describes some SOB as well as LE edema. No orthopnea or PND  - recent repeat echo shows stable mild AI and MR.   2. HTN  - does not check at home  - compliant with meds    3. Hyperlipidemia  - compliant with crestor  - 08/2013 TC 180 TG 124 HDL 55 LDL 10   4. Diastolic dysfunction  - recent echo shows grade II diastolic dysfunction  - notes + LE edema. Uses 2 pillows more for back comfort. + DOE < 1/2 block, stable over 1 year.  - takes ibuprofen approx twice a week.  - last visit stopped HCTZ, started lasix 20mg  bid. Repeat labs 03/2014 showed K 4.3, Cr 0.73 BUN 8.   5. Noncardiac chest pain  - pleuritic chest pain, worst with deep breaths. Located midchest, sharp pain 6/10. Constant x 48 hours. Denies any known instigating event. Mild nausea and vomiting, no other associated symptoms. Worst with pressing on area.   Past Medical History  Diagnosis Date  . Syncope     exertional  . Diabetes mellitus   . Hypertension   . Obesity   . Asthma     Uses p.r.n. albuterol  . Tobacco user     30 pack years; 04/2011: 1/4 pack per day during quick attempt  . Aortic valve disease     Long-standing systolic murmur  . Dyspnea on exertion     poor exercise tolerance  . Palpitations   . Depression with anxiety   . Fibroids     uterine; postmenopausal bleeding  . Degenerative joint disease     right knee  . COPD (chronic obstructive pulmonary disease)   . Headache(784.0)   . Depression   . Bronchitis      Allergies  Allergen Reactions  . Aspirin Hives  . Penicillins Rash     Current Outpatient Prescriptions  Medication Sig Dispense Refill  . acetaminophen (TYLENOL) 500 MG tablet Take 1,500 mg by mouth daily as needed.      Marland Kitchen albuterol (PROVENTIL) (5 MG/ML) 0.5% nebulizer solution Take 2.5 mg by nebulization 3 (three)  times daily.      Marland Kitchen albuterol (PROVENTIL) 90 MCG/ACT inhaler Inhale 2 puffs into the lungs 4 (four) times daily. Asthmatic Symptoms      . cloNIDine (CATAPRES) 0.1 MG tablet Take 0.1 mg by mouth 2 (two) times daily.      . DULoxetine (CYMBALTA) 60 MG capsule Take 120 mg by mouth daily.       Marland Kitchen esomeprazole (NEXIUM) 40 MG capsule Take 40 mg by mouth daily before breakfast.      . estradiol (ESTRACE) 1 MG tablet Take 1 mg by mouth daily.      . furosemide (LASIX) 20 MG tablet Take 1 tablet (20 mg total) by mouth 2 (two) times daily.  60 tablet  6  . gabapentin (NEURONTIN) 100 MG capsule Take 100 mg by mouth 3 (three) times daily.      . hydrochlorothiazide (MICROZIDE) 12.5 MG capsule Take 12.5 mg by mouth 2 (two) times daily.       Marland Kitchen ibuprofen (ADVIL,MOTRIN) 200 MG tablet Take 600 mg by mouth every 6 (six) hours as needed. Pain      . lisinopril (PRINIVIL,ZESTRIL) 20 MG tablet Take 20 mg by  mouth 2 (two) times daily.      . risperiDONE (RISPERDAL) 0.5 MG tablet Take 0.5 mg by mouth 2 (two) times daily.      . rosuvastatin (CRESTOR) 20 MG tablet Take 20 mg by mouth daily.      . sitaGLIPtan-metformin (JANUMET) 50-500 MG per tablet Take 1 tablet by mouth 2 (two) times daily with a meal.      . traZODone (DESYREL) 100 MG tablet Take 200 mg by mouth at bedtime.       . verapamil (CALAN) 80 MG tablet Take 1 tablet (80 mg total) by mouth 3 (three) times daily.  90 tablet  6   No current facility-administered medications for this visit.     Past Surgical History  Procedure Laterality Date  . Tubal ligation    . Abdominal hysterectomy  12/21/2011    Procedure: HYSTERECTOMY ABDOMINAL;  Surgeon: Jonnie Kind, MD;  Location: [facility];  Service: Gynecology;  Laterality: N/A;     Allergies  Allergen Reactions  . Aspirin Hives  . Penicillins Rash      Family History  Problem Relation Age of Onset  . Lung cancer Mother   . Heart disease Brother   . Breast cancer Sister   . Heart disease  Maternal Aunt   . Anesthesia problems Neg Hx   . Hypotension Neg Hx   . Malignant hyperthermia Neg Hx   . Pseudochol deficiency Neg Hx   . Cancer Cousin     lung     Social History [name] reports that she has been smoking Cigarettes.  She has a 7.5 pack-year smoking history. She has never used smokeless tobacco. [name] reports that she does not drink alcohol.   Review of Systems CONSTITUTIONAL: No weight loss, fever, chills, weakness or fatigue.  HEENT: Eyes: No visual loss, blurred vision, double vision or yellow sclerae.No hearing loss, sneezing, congestion, runny nose or sore throat.  SKIN: No rash or itching.  CARDIOVASCULAR: per HPI RESPIRATORY: No shortness of breath, cough or sputum.  GASTROINTESTINAL: No anorexia, nausea, vomiting or diarrhea. No abdominal pain or blood.  GENITOURINARY: No burning on urination, no polyuria NEUROLOGICAL: No headache, dizziness, syncope, paralysis, ataxia, numbness or tingling in the extremities. No change in bowel or bladder control.  MUSCULOSKELETAL: No muscle, back pain, joint pain or stiffness.  LYMPHATICS: No enlarged nodes. No history of splenectomy.  PSYCHIATRIC: No history of depression or anxiety.  ENDOCRINOLOGIC: No reports of sweating, cold or heat intolerance. No polyuria or polydipsia.  Marland Kitchen   Physical Examination p 75 bp 170/90 Wt 300 lbs BMI 47 Gen: resting comfortably, no acute distress HEENT: no scleral icterus, pupils equal round and reactive, no palptable cervical adenopathy,  CV: RRR, no m/r/g, no JVD, no carotid bruits Resp: Clear to auscultation bilaterally GI: abdomen is soft, non-tender, non-distended, normal bowel sounds, no hepatosplenomegaly MSK: extremities are warm, no edema.  Skin: warm, no rash Neuro:  no focal deficits Psych: appropriate affect   Diagnostic Studies 10/2012 Event monitor: no arrhythmias   08/2010 Echo:  Left ventricle: The cavity size was normal. Wall thickness was  normal.  Systolic function was normal. The estimated ejection  fraction was in the range of 60% to 65%. Wall motion was normal;  there were no regional wall motion abnormalities. The study is not  technically sufficient to allow evaluation of LV diastolic  function.  - Aortic valve: Mildly calcified annulus. Mild regurgitation.  - Mitral valve: Mild regurgitation.  - Tricuspid  valve: Mild regurgitation.  - Pericardium, extracardiac: There was no pericardial effusion.   11/14/13 Clinic EKG  Sinus rhythm   10/2013 Echo  LVEF 60-65%, grade II diastolic dysfunction, mild AI, mild MR   01/23/14 Clinic EKG  NSR, LAE, no ischemic changes     Assessment and Plan  1. Valvular heart disease  - mild AI and MR from prior echo 2011. Exam limited by body habitus, difficult to hear murmurs.  - repeat echo shows stable disease, continue to follow   2. HTN  - above goal, recently started on verapamil by pcp - follow pressures  3. Hyperlipidemia  - at goal, continue current statin  4. Chronic diastolic heart failure  - diuretic change as described above   5. Noncardiac chest pain  - pleuritic in description nature, reproducible with palpation. EKG in clinic today without any ischemic changes  - no further cardiac workup at this time       Arnoldo Lenis, M.D.

## 2014-05-21 NOTE — Patient Instructions (Signed)
   Stop HCTZ  Change Lasix to 40mg  every morning & 20mg  every evening - new sent to [facility] Continue all other medications.   Nurse visit next week for BP check Your physician wants you to follow up in:  4 months.  You will receive a reminder letter in the mail one-two months in advance.  If you don't receive a letter, please call our office to schedule the follow up appointment

## 2014-05-28 ENCOUNTER — Telehealth: Payer: Self-pay | Admitting: *Deleted

## 2014-05-28 NOTE — Telephone Encounter (Signed)
bp looks good, continue current therapy   Zandra Abts MD

## 2014-05-28 NOTE — Telephone Encounter (Signed)
Patient in office for BP check this morning.    132/75  60  Patient last seen 05/21/2014 - stopped HCTZ & changed Lasix to 40mg  every morning & 20mg  every evening.  Next follow up is 4 months.

## 2014-05-29 NOTE — Telephone Encounter (Signed)
Patient notified via voicemail.

## 2014-06-11 ENCOUNTER — Emergency Department (HOSPITAL_COMMUNITY)
Admission: EM | Admit: 2014-06-11 | Discharge: 2014-06-11 | Disposition: A | Discharge status: Home / Self Care | Payer: Self-pay | Attending: Emergency Medicine | Admitting: Emergency Medicine

## 2014-06-11 ENCOUNTER — Encounter (HOSPITAL_COMMUNITY): Payer: Self-pay | Admitting: Emergency Medicine

## 2014-06-11 ENCOUNTER — Emergency Department (HOSPITAL_COMMUNITY): Payer: Self-pay

## 2014-06-11 DIAGNOSIS — Z79899 Other long term (current) drug therapy: Secondary | ICD-10-CM | POA: Insufficient documentation

## 2014-06-11 DIAGNOSIS — F17200 Nicotine dependence, unspecified, uncomplicated: Secondary | ICD-10-CM | POA: Insufficient documentation

## 2014-06-11 DIAGNOSIS — Z8742 Personal history of other diseases of the female genital tract: Secondary | ICD-10-CM | POA: Insufficient documentation

## 2014-06-11 DIAGNOSIS — IMO0002 Reserved for concepts with insufficient information to code with codable children: Secondary | ICD-10-CM | POA: Insufficient documentation

## 2014-06-11 DIAGNOSIS — E669 Obesity, unspecified: Secondary | ICD-10-CM | POA: Insufficient documentation

## 2014-06-11 DIAGNOSIS — J4489 Other specified chronic obstructive pulmonary disease: Secondary | ICD-10-CM | POA: Insufficient documentation

## 2014-06-11 DIAGNOSIS — H5789 Other specified disorders of eye and adnexa: Secondary | ICD-10-CM | POA: Insufficient documentation

## 2014-06-11 DIAGNOSIS — F341 Dysthymic disorder: Secondary | ICD-10-CM | POA: Insufficient documentation

## 2014-06-11 DIAGNOSIS — J449 Chronic obstructive pulmonary disease, unspecified: Secondary | ICD-10-CM | POA: Insufficient documentation

## 2014-06-11 DIAGNOSIS — M25539 Pain in unspecified wrist: Secondary | ICD-10-CM | POA: Insufficient documentation

## 2014-06-11 DIAGNOSIS — M1710 Unilateral primary osteoarthritis, unspecified knee: Secondary | ICD-10-CM | POA: Insufficient documentation

## 2014-06-11 DIAGNOSIS — I10 Essential (primary) hypertension: Secondary | ICD-10-CM | POA: Insufficient documentation

## 2014-06-11 DIAGNOSIS — R51 Headache: Secondary | ICD-10-CM | POA: Insufficient documentation

## 2014-06-11 DIAGNOSIS — M79602 Pain in left arm: Secondary | ICD-10-CM

## 2014-06-11 DIAGNOSIS — E119 Type 2 diabetes mellitus without complications: Secondary | ICD-10-CM | POA: Insufficient documentation

## 2014-06-11 DIAGNOSIS — Z794 Long term (current) use of insulin: Secondary | ICD-10-CM | POA: Insufficient documentation

## 2014-06-11 DIAGNOSIS — Z880 Allergy status to penicillin: Secondary | ICD-10-CM | POA: Insufficient documentation

## 2014-06-11 LAB — CBC WITH DIFFERENTIAL/PLATELET
Basophils Absolute: 0 10*3/uL (ref 0.0–0.1)
Basophils Relative: 0 % (ref 0–1)
EOS ABS: 0.1 10*3/uL (ref 0.0–0.7)
EOS PCT: 2 % (ref 0–5)
HEMATOCRIT: 38.5 % (ref 36.0–46.0)
HEMOGLOBIN: 13.1 g/dL (ref 12.0–15.0)
LYMPHS ABS: 2.8 10*3/uL (ref 0.7–4.0)
LYMPHS PCT: 58 % — AB (ref 12–46)
MCH: 30.8 pg (ref 26.0–34.0)
MCHC: 34 g/dL (ref 30.0–36.0)
MCV: 90.6 fL (ref 78.0–100.0)
MONOS PCT: 7 % (ref 3–12)
Monocytes Absolute: 0.3 10*3/uL (ref 0.1–1.0)
Neutro Abs: 1.6 10*3/uL — ABNORMAL LOW (ref 1.7–7.7)
Neutrophils Relative %: 33 % — ABNORMAL LOW (ref 43–77)
PLATELETS: 179 10*3/uL (ref 150–400)
RBC: 4.25 MIL/uL (ref 3.87–5.11)
RDW: 13 % (ref 11.5–15.5)
WBC: 4.8 10*3/uL (ref 4.0–10.5)

## 2014-06-11 LAB — COMPREHENSIVE METABOLIC PANEL
ALK PHOS: 117 U/L (ref 39–117)
ALT: 25 U/L (ref 0–35)
ANION GAP: 10 (ref 5–15)
AST: 20 U/L (ref 0–37)
Albumin: 3.3 g/dL — ABNORMAL LOW (ref 3.5–5.2)
BUN: 9 mg/dL (ref 6–23)
CALCIUM: 9.1 mg/dL (ref 8.4–10.5)
CO2: 29 meq/L (ref 19–32)
Chloride: 105 mEq/L (ref 96–112)
Creatinine, Ser: 0.67 mg/dL (ref 0.50–1.10)
GFR calc non Af Amer: >90 mL/min (ref >90)
GLUCOSE: 142 mg/dL — AB (ref 70–99)
Potassium: 3.9 mEq/L (ref 3.7–5.3)
Sodium: 144 mEq/L (ref 137–147)
Total Bilirubin: 0.4 mg/dL (ref 0.3–1.2)
Total Protein: 7.4 g/dL (ref 6.0–8.3)

## 2014-06-11 MED ORDER — HYDROCODONE-ACETAMINOPHEN 5-325 MG PO TABS: 1.0000 | ORAL_TABLET | Freq: Four times a day (QID) | ORAL | Status: DC | PRN

## 2014-06-11 MED ORDER — PREDNISONE 50 MG PO TABS
60.0000 mg | ORAL_TABLET | Freq: Once | ORAL | Status: AC
  Administered 2014-06-11: 60 mg via ORAL
  Filled 2014-06-11 (×2): qty 1

## 2014-06-11 MED ORDER — ACETAMINOPHEN 325 MG PO TABS
650.0000 mg | ORAL_TABLET | Freq: Once | ORAL | Status: AC
  Administered 2014-06-11: 650 mg via ORAL
  Filled 2014-06-11: qty 2

## 2014-06-11 MED ORDER — PREDNISONE 10 MG PO TABS: 20.0000 mg | ORAL_TABLET | Freq: Every day | ORAL | Status: DC

## 2014-06-11 NOTE — ED Notes (Signed)
Pt with dizziness since Sunday but has gotten worse today, denies HA, states unable to use left arm as well, grips less on left than right, symmetrical face

## 2014-06-11 NOTE — ED Provider Notes (Signed)
CSN: [id number]     Arrival date & time 06/11/14  1055 History  This chart was scribed for Maudry Diego, MD,  by Stacy Gardner, ED Scribe. The patient was seen in room APA15/[facility]15 and the patient's care was started at 11:18 AM.    First MD Initiated Contact with Patient 06/11/14 1105     Chief Complaint  Patient presents with  . Dizziness     (Consider location/radiation/quality/duration/timing/severity/associated sxs/prior Treatment) Patient is a 53 y.o. female presenting with dizziness. The history is provided by medical records and the patient (pt complains of pain in left arm since sunday a with weakness). No language interpreter was used.  Dizziness Quality:  Imbalance Severity:  Moderate Onset quality:  Sudden Duration:  4 days Timing:  Constant Chronicity:  New Relieved by:  Nothing Ineffective treatments:  None tried  HPI Comments: Amber Harris is a 52 y.o. female who presents to the Emergency Department complaining of constant, moderate, left sided head pain, onset four days PTA. Pt reports having left pain and arm weakness  For months with  Worse symptoms  two days ago. Pt states, while at a dinner at her church she was drinking [employer] her left arm fell in to her lap and since that time she has been unable to lift or extend her left arm without pain to her forearm. Pt explains when she tries to stretch her L arm she has a "twisting" sensation. She mentions having left arm pain for " a little while" prior to two days ago. Denies elbow pain.  Pt states when she is walking she feels as though she is falling forward due to imbalance and dizziness.. She reports having watery eye discharge. Nothing seems to help.  Past Medical History  Diagnosis Date  . Syncope     exertional  . Diabetes mellitus   . Hypertension   . Obesity   . Asthma     Uses p.r.n. albuterol  . Tobacco user     30  pack years; 04/2011: 1/4 pack per day during quick attempt  . Aortic valve disease      Long-standing systolic murmur  . Dyspnea on exertion     poor exercise tolerance  . Palpitations   . Depression with anxiety   . Fibroids     uterine; postmenopausal bleeding  . Degenerative joint disease     right knee  . COPD (chronic obstructive pulmonary disease)   . Headache(784.0)   . Depression   . Bronchitis    Past Surgical History  Procedure Laterality Date  . Tubal ligation    . Abdominal hysterectomy  12/21/2011    Procedure: HYSTERECTOMY ABDOMINAL;  Surgeon: Jonnie Kind, MD;  Location: [facility];  Service: Gynecology;  Laterality: N/A;   Family History  Problem Relation Age of Onset  . Lung cancer Mother   . Heart disease Brother   . Breast cancer Sister   . Heart disease Maternal Aunt   . Anesthesia problems Neg Hx   . Hypotension Neg Hx   . Malignant hyperthermia Neg Hx   . Pseudochol deficiency Neg Hx   . Cancer Cousin     lung   History  Substance Use Topics  . Smoking status: Current Every Day Smoker -- 0.25 packs/day for 30 years    Types: Cigarettes    Start date: 10/02/1974  . Smokeless tobacco: Never Used     Comment: smoking 3 cigs daily since 04-2013  . Alcohol Use: No  OB History   Grav Para Term Preterm Abortions TAB SAB Ect Mult Living                 Review of Systems  Eyes: Positive for discharge (watery).  Musculoskeletal: Positive for arthralgias, gait problem and myalgias.  Neurological: Positive for dizziness and weakness.  All other systems reviewed and are negative.     Allergies  Aspirin and Penicillins  Home Medications   Prior to Admission medications   Medication Sig Start Date End Date Taking? Authorizing Provider  albuterol (PROVENTIL) (5 MG/ML) 0.5% nebulizer solution Take 2.5 mg by nebulization 3 (three) times daily as needed.     Historical Provider, MD  albuterol (PROVENTIL) 90 MCG/ACT inhaler Inhale 2 puffs into the lungs 4 (four) times daily. Asthmatic Symptoms    Historical Provider, MD  cloNIDine  (CATAPRES) 0.2 MG tablet Take 0.2 mg by mouth 2 (two) times daily.    Historical Provider, MD  DULoxetine (CYMBALTA) 60 MG capsule Take 60 mg by mouth daily.     Historical Provider, MD  esomeprazole (NEXIUM) 40 MG capsule Take 40 mg by mouth daily before breakfast.    Historical Provider, MD  estradiol (ESTRACE) 1 MG tablet Take 1 mg by mouth daily.    Historical Provider, MD  furosemide (LASIX) 20 MG tablet Take 2 tabs (40mg ) every morning & 1 tab (20mg ) every evening 05/21/14   Arnoldo Lenis, MD  gabapentin (NEURONTIN) 100 MG capsule Take 100 mg by mouth 3 (three) times daily.    Historical Provider, MD  ibuprofen (ADVIL,MOTRIN) 200 MG tablet Take 600 mg by mouth every 6 (six) hours as needed. Pain    Historical Provider, MD  Insulin Glargine (LANTUS) 100 UNIT/ML Solostar Pen Inject 10 Units into the skin daily.    Historical Provider, MD  lisinopril (PRINIVIL,ZESTRIL) 20 MG tablet Take 20 mg by mouth 2 (two) times daily.    Historical Provider, MD  risperiDONE (RISPERDAL) 0.5 MG tablet Take 0.5 mg by mouth 2 (two) times daily.    Historical Provider, MD  rosuvastatin (CRESTOR) 20 MG tablet Take 20 mg by mouth daily.    Historical Provider, MD  sitaGLIPtan-metformin (JANUMET) 50-500 MG per tablet Take 1 tablet by mouth 2 (two) times daily with a meal.    Historical Provider, MD  traZODone (DESYREL) 100 MG tablet Take 200 mg by mouth at bedtime.     Historical Provider, MD  verapamil (CALAN) 80 MG tablet Take 1 tablet (80 mg total) by mouth 3 (three) times daily. 11/14/13   Arnoldo Lenis, MD   BP 194/103  Pulse 71  Temp(Src) 98.5 F (36.9 C) (Oral)  Resp 20  Ht 5\' 6"  (1.676 m)  Wt 297 lb (134.718 kg)  BMI 47.96 kg/m2  SpO2 99%  LMP 10/25/2011 Physical Exam  Constitutional: She is oriented to person, place, and time. She appears well-developed.  HENT:  Head: Normocephalic.  Eyes: Conjunctivae and EOM are normal. No scleral icterus.  Neck: Neck supple. No thyromegaly present.   Cardiovascular: Normal rate and regular rhythm.   Pulmonary/Chest: Effort normal and breath sounds normal.  Abdominal: She exhibits no distension. There is no tenderness. There is no rebound.  Musculoskeletal: She exhibits no edema.  Tenderness to the left forerm mid shaft.  neurovasc nl.  Pain with movement of forearm with mild weakness 2nd to apin   Neurological: She is oriented to person, place, and time. She exhibits normal muscle tone. Coordination normal.  Skin: No rash  noted. No erythema.  Psychiatric: She has a normal mood and affect. Her behavior is normal.    ED Course  Procedures (including critical care time) DIAGNOSTIC STUDIES: Oxygen Saturation is 99% on room air, normal by my interpretation.    COORDINATION OF CARE:  11:21 AM Discussed course of care with pt which includes left forearm x-ray and laboratory tests. Pt understands and agrees.    Labs Review Labs Reviewed - No data to display  Imaging Review No results found.   EKG Interpretation None      MDM   Final diagnoses:  None    Extremity pain and weakness,  Nl ct head will have ortho follow up this week.   The chart was scribed for me under my direct supervision.  I personally performed the history, physical, and medical decision making and all procedures in the evaluation of this patient.Maudry Diego, MD 06/11/14 1501

## 2014-06-11 NOTE — Discharge Instructions (Signed)
Follow up with Dr. Luna Glasgow this week

## 2014-06-18 ENCOUNTER — Ambulatory Visit (INDEPENDENT_AMBULATORY_CARE_PROVIDER_SITE_OTHER): Payer: Self-pay | Admitting: Sports Medicine

## 2014-06-18 ENCOUNTER — Encounter: Payer: Self-pay | Admitting: Sports Medicine

## 2014-06-18 VITALS — BP 192/124 | Ht 66.0 in | Wt 298.0 lb

## 2014-06-18 DIAGNOSIS — M75102 Unspecified rotator cuff tear or rupture of left shoulder, not specified as traumatic: Secondary | ICD-10-CM

## 2014-06-18 DIAGNOSIS — M67919 Unspecified disorder of synovium and tendon, unspecified shoulder: Secondary | ICD-10-CM

## 2014-06-18 DIAGNOSIS — M719 Bursopathy, unspecified: Secondary | ICD-10-CM

## 2014-06-18 MED ORDER — METHYLPREDNISOLONE ACETATE 80 MG/ML IJ SUSP
80.0000 mg | Freq: Once | INTRAMUSCULAR | Status: AC
  Administered 2014-06-18: 80 mg via INTRA_ARTICULAR

## 2014-06-18 NOTE — Progress Notes (Signed)
Subjective:    Patient ID: Amber Harris, female    DOB: 11-22-1961, 53 y.o.   MRN: [id number]  HPI Amber Harris is a 53 year old female who presents for a left upper extremity pain. Onset of symptoms was approximately 06/09/14 when she states she was cleaning her home. She denies any known acute injury, though states that the pain did start suddenly soon after she began cleaning. Her pain gradually worsened and she went to the emergency department on 06/11/14, where x-rays were performed of the left forearm which were negative for fracture dislocations. Today, however she localizes most of her pain to the left lateral shoulder. Her pain is aggravated with shoulder abduction or overhead movements. She has associated weakness in her hand and left middle finger numbness. She also states that she has some associated numbness in her lower neck, but denies significant pain or stiffness. Her pain has intensified and will wake her up at night. She had also complained of dizziness in the emergency room and a CT of the head was obtained, which was negative. She had tried a Medrol Dosepak prescribed by the emergency department, Vicodin, and a sling which mildly improved her symptoms.   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 She is currently smoking one pack of cigarettes per day, which he says is less than her previous 3 packs per day.  Past medical, family, medications, allergies, social histories were reviewed and are relevant as stated above or otherwise noncontributory.   Review of Systems Pertinent positives: Left shoulder pain, numbness, tingling, weakness Pertinent negatives: Fevers, chills, headache, malaise, myalgias, incoordination An 11 point review of systems was performed and is otherwise negative.    Objective:   Physical Exam BP 192/124  Ht 5\' 6"  (1.676 m)  Wt 298 lb (135.172 kg)  BMI 48.12 kg/m2  LMP 10/25/2011 GEN: The patient is  well-developed well-nourished female and in no acute distress.  She holds her left arm in a sling posterior body. She is awake alert and oriented x3. SKIN: warm and well-perfused, no rash  Neuro: Strength 5/5 globally. Diminished sensation to the left third digit both palmar and dorsally, not extending to the palm or dorsal hand with otherwise intact sensation throughout. DTRs 2/4 bilateral biceps, triceps, brachioradialis. Vasc: +2 bilateral distal radial and brachial pulses. No edema.  MSK:  Full range of motion at the bilateral wrists and elbow. No bony tenderness at the wrist, distal radius or ulna. Examination of the left shoulder reveals the following: Atrophy: none   Cervical ROM Full, negative Spurling's Shoulder ROM: Right <---> Left   Forward flexion 180<--->90 ER at side 60<--->50 Abd ER 90<--->90 Abd IR 60<--->45 IR up back T6<--->to side TTP:  AC joint:  [-]  Supraspinatus insertion:  [+]  Subsca/biceps:  [+]  Periscapular:  [-]  Trap:  [+] Cuff: Impingement/cuff: Hawkins [+]   Jobes: [+]   FF strength:[5/5]   ER strength:[5/5]   Abdominal compression test:[-] Lag signs:[None] Laxity/instabilty:  [-] Yergason:  [-]           Speeds:  [+]    Crank:  [-]                     Active Compression: [-]  Procedure: Left Subacromial Injection: Consent obtained and verified. Sterile betadine prep. Furthur cleansed with alcohol. Topical analgesic spray: Ethyl chloride. Joint: Left subacromial injection Approached in typical fashion with: palpation and needle insertion just  inferior to lateral posterior acromion Completed without difficulty Meds: 80mg  depo-Medrol in 4 cc of 1% plain lidocaine without epinephrine Needle: 22 gauge Aftercare instructions and Red flags advised.    Assessment & Plan:  1. Left rotator cuff syndrome -Patient tolerated the above procedure well with good relief of symptoms following injection. -In order formal physical therapy was given to the patient to  work on range of motion and rotator cuff strengthening -We discussed that the patient should try to wear her sling as little as possible as to avoid the shoulder stiffening up -She may continue to take over-the-counter anti-inflammatories, and expect she would need less of her remaining Vicodin. -I counseled her that injection of steroid as the risk of increased blood glucose, and she should continue to check her blood sugar 3 times daily. If she notices that her blood sugar is rising consistently above 200, that she is to call her primary care office and request her insulin adjusted for the steroid. -We will see her back in 4 weeks or sooner if needed. If she has improvement, but significant symptoms still persist, we will consider ultrasound of the left shoulder.

## 2014-06-24 ENCOUNTER — Ambulatory Visit (HOSPITAL_COMMUNITY)
Admission: RE | Admit: 2014-06-24 | Discharge: 2014-06-24 | Disposition: A | Discharge status: Home / Self Care | Payer: Self-pay | Source: Ambulatory Visit | Attending: Physician Assistant | Admitting: Physician Assistant

## 2014-06-24 DIAGNOSIS — M25619 Stiffness of unspecified shoulder, not elsewhere classified: Secondary | ICD-10-CM | POA: Insufficient documentation

## 2014-06-24 DIAGNOSIS — E119 Type 2 diabetes mellitus without complications: Secondary | ICD-10-CM | POA: Insufficient documentation

## 2014-06-24 DIAGNOSIS — F17200 Nicotine dependence, unspecified, uncomplicated: Secondary | ICD-10-CM | POA: Insufficient documentation

## 2014-06-24 DIAGNOSIS — M6281 Muscle weakness (generalized): Secondary | ICD-10-CM | POA: Insufficient documentation

## 2014-06-24 DIAGNOSIS — R209 Unspecified disturbances of skin sensation: Secondary | ICD-10-CM | POA: Insufficient documentation

## 2014-06-24 DIAGNOSIS — IMO0001 Reserved for inherently not codable concepts without codable children: Secondary | ICD-10-CM | POA: Insufficient documentation

## 2014-06-24 DIAGNOSIS — M25612 Stiffness of left shoulder, not elsewhere classified: Secondary | ICD-10-CM

## 2014-06-24 DIAGNOSIS — I10 Essential (primary) hypertension: Secondary | ICD-10-CM | POA: Insufficient documentation

## 2014-06-24 DIAGNOSIS — M25512 Pain in left shoulder: Secondary | ICD-10-CM

## 2014-06-24 DIAGNOSIS — M25519 Pain in unspecified shoulder: Secondary | ICD-10-CM | POA: Insufficient documentation

## 2014-06-24 NOTE — Evaluation (Signed)
Occupational Therapy Evaluation  Patient Details  Name: Amber Harris MRN: [id number] Date of Birth: [birth year]  Today's Date: 06/24/2014 Time: 7829-5621 OT Time Calculation (min): 33 min Eval 33'  Visit#: 1 of 24  Re-eval: 07/22/14  Assessment Diagnosis: Osteoarthritis of L shoulder, rotator cuff syndrome Surgical Date:  (n/a) Next MD Visit: August 27th Prior Therapy: after knee surgery  Authorization: Self Pay  Authorization Time Period:    Authorization Visit#:   of     Past Medical History:  Past Medical History  Diagnosis Date  . Syncope     exertional  . Diabetes mellitus   . Hypertension   . Obesity   . Asthma     Uses p.r.n. albuterol  . Tobacco user     30 pack years; 04/2011: 1/4 pack per day during quick attempt  . Aortic valve disease     Long-standing systolic murmur  . Dyspnea on exertion     poor exercise tolerance  . Palpitations   . Depression with anxiety   . Fibroids     uterine; postmenopausal bleeding  . Degenerative joint disease     right knee  . COPD (chronic obstructive pulmonary disease)   . Headache(784.0)   . Depression   . Bronchitis    Past Surgical History:  Past Surgical History  Procedure Laterality Date  . Tubal ligation    . Abdominal hysterectomy  12/21/2011    Procedure: HYSTERECTOMY ABDOMINAL;  Surgeon: Tilda Burrow, MD;  Location: [facility];  Service: Gynecology;  Laterality: N/A;    Subjective Symptoms/Limitations Symptoms: S: "I woke up and it wasn't working" Pertinent History: Approximately 2.5 weeks ago pt work up with decreased motion and strenght in her LUE.  pt waited 1 week and then went to MD due to increased pain.  pt reports that her dx is frozen shoulder.  the MD provided a steroid injection, but the decreased pain from steroid only lasted a few hours.  pt wore a sling for 1 week, and has now been without the sling for an additional 1 week. Special Tests: FOTO 47/100 Patient Stated Goals: to pick up her  grandbaby Pain Assessment Currently in Pain?: Yes Pain Score: 3  (At worst 7/10) Pain Location: Shoulder Pain Orientation: Left Pain Type: Acute pain  Precautions/Restrictions  Precautions Precautions: None Restrictions Weight Bearing Restrictions: No  Balance Screening Balance Screen Has the patient fallen in the past 6 months: No  Prior Functioning  Home Living Family/patient expects to be discharged to:: Private residence Living Arrangements: Spouse/significant other Available Help at Discharge: Family Prior Function Level of Independence: Needs assistance with ADLs;Needs assistance with homemaking Driving: Yes Vocation: Other (comment) (Awaiting disability) Leisure: Hobbies-yes (Comment) (spending time with grandbaby) Comments: Boyfriend assists with bathing and dressing (pt reports mod assist), and IADL tasks  Assessment ADL/Vision/Perception ADL ADL Comments: Pt has been dropping items with LUE.  She is having increased diffiuclty using her LUE for any ADL tasks, in particular sweeping and making the bed Dominant Hand: Right Vision - History Baseline Vision: Wears glasses for distance only  Cognition/Observation Cognition Overall Cognitive Status: Within Functional Limits for tasks assessed Arousal/Alertness: Awake/alert Orientation Level: Oriented X4  Sensation/Coordination/Edema Sensation Light Touch: Impaired Detail (impaired sharp./dull.  deep pressure intact) Light Touch Impaired Details: Impaired LUE (along ulnar edge - fingertip to first palmar crease) Additional Comments: Pt also mentioned numbness along cervical neck region, but states that this numbness has been present for multiple years Coordination Gross Motor Movements are  Fluid and Coordinated: No Fine Motor Movements are Fluid and Coordinated: Yes  Additional Assessments RUE Strength Grip (lbs): 92 Lateral Pinch: 18 lbs 3 Point Pinch: 17 lbs LUE AROM (degrees) LUE Overall AROM Comments:  Assess in supine, with ER/IR shoulder adducted Left Shoulder Flexion: 29 Degrees Left Shoulder ABduction: 37 Degrees Left Shoulder Internal Rotation: 88 Degrees Left Shoulder External Rotation: -28 Degrees LUE PROM (degrees) LUE Overall PROM Comments: Assessed in supine, with ER/IR shoulder adducted Left Shoulder Flexion: 40 Degrees (8/10 pain) Left Shoulder ABduction: 25 Degrees (6/10) Left Shoulder Internal Rotation: 67 Degrees Left Shoulder External Rotation: 15 Degrees LUE Strength LUE Overall Strength Comments: Not assess this session due to increased pain and decreased ROM Grip (lbs): 22 Lateral Pinch: 10 lbs 3 Point Pinch: 9 lbs Palpation Palpation: Mod fascial restrictions in LUE bicep and tricep regions. Max restrictions in scapular region Right Hand Strength - Pinch (lbs) Lateral Pinch: 18 lbs 3 Point Pinch: 17 lbs Left Hand Strength - Pinch (lbs) Lateral Pinch: 10 lbs 3 Point Pinch: 9 lbs   Occupational Therapy Assessment and Plan OT Assessment and Plan Clinical Impression Statement: Pt is presenting to outpatient OT with osteoarthritis and rotator cuff syndrome of ther left shoulder.  Pt has increased pain, decreased ROM, increased fascial restricions and decreased strength, contributing to decreased LUE functional use and ADL/IADL status Pt will benefit from skilled therapeutic intervention in order to improve on the following deficits: Increased fascial restricitons;Pain;Impaired UE functional use;Decreased strength;Impaired sensation;Decreased range of motion Rehab Potential: Good OT Frequency: Min 2X/week OT Duration: 12 weeks OT Treatment/Interventions: Self-care/ADL training;Modalities;Therapeutic exercise;Therapeutic activities;Energy conservation;Manual therapy;Patient/family education OT Plan: P: Pt will benefit from skilled OT services to decrease pain, decrease fascial restricions, improve strength, and increase ROM for improved engagement in ADL and IADL  tasks.       Treatment Plan: myofascial release and manual stretching, PROM, AAROM, AROM, general strengthening, proximal shoulder strengthening, and scapular strengthening.  Follow up with pt about self-pay for therapy visits.   Goals Home Exercise Program Pt/caregiver will Perform Home Exercise Program: For increased ROM;For increased strengthening PT Goal: Perform Home Exercise Program - Progress: Goal set today Short Term Goals Time to Complete Short Term Goals: 6 weeks Short Term Goal 1: Pt will be educated on HEP. Short Term Goal 2: Pt will achieve LUE PROM WFL in working towards improved ability to make her bed Short Term Goal 3: Pt will achieve atleast 50% range with LUE AROM in working towards ability to make her bed Short Term Goal 4: Pt will have pain of less than 4/10 during daily activites involving LUE AROM. Short Term Goal 5: Pt will improve LUE grip strength to at least 32 lbs for pt verbal report of 50% less drops. Additional Short Term Goals?: Yes Short Term Goal 6: Pt will improve LUE fascial restrictions to min-moderate amount for decreased pain in ROM Long Term Goals Time to Complete Long Term Goals: 12 weeks Long Term Goal 1: Pt will achieve prior level of functioning in all ADL, IADL, and leisure activites. Long Term Goal 2: Pt will achieve LUE AROM WFL for improved ability to reach into kitchen cabinets. Long Term Goal 3: Pt will have pain of less than 2/10 during daily activites using LUE Long Term Goal 4: Pt will decrease fascial restrictions to trace-min for decreased pain in daily activites and ROM Long Term Goal 5: Pt will achieve strength of 4/5 in LUE for ability to safely pick up grandson.  Problem List  Patient Active Problem List   Diagnosis Date Noted  . Muscle weakness (generalized) 06/24/2014  . Pain in joint, shoulder region 06/24/2014  . Decreased range of motion of left shoulder 06/24/2014  . Renal colic on right side 09/19/2013  . Hyperlipidemia  LDL goal <100 10/11/2012  . Tobacco user   . Aortic valve disease   . Dyspnea on exertion   . Palpitations   . Depression with anxiety   . Syncope   . Diabetes mellitus, type II   . Hypertension   . Obesity     End of Session Activity Tolerance: Patient tolerated treatment well General Behavior During Therapy: WFL for tasks assessed/performed OT Plan of Care OT Home Exercise Plan: Towel Slides OT Patient Instructions: demonstrated and handout provided (scanned) Consulted and Agree with Plan of Care: Patient  GO    Marry Guan, MS, OTR/L [phone]  06/24/2014, 4:29 PM  Physician Documentation Your signature is required to indicate approval of the treatment plan as stated above.  Please sign and either send electronically or make a copy of this report for your files and return this physician signed original.  Please mark one 1.__approve of plan  2. ___approve of plan with the following conditions.   ______________________________                                                          _____________________ Physician Signature                                                                                                             Date

## 2014-06-28 ENCOUNTER — Ambulatory Visit (HOSPITAL_COMMUNITY): Admission: RE | Admit: 2014-06-28 | Payer: Self-pay | Source: Ambulatory Visit

## 2014-07-03 ENCOUNTER — Ambulatory Visit (HOSPITAL_COMMUNITY)
Admission: RE | Admit: 2014-07-03 | Discharge: 2014-07-03 | Disposition: A | Discharge status: Home / Self Care | Payer: Self-pay | Source: Ambulatory Visit | Attending: Physician Assistant | Admitting: Physician Assistant

## 2014-07-03 DIAGNOSIS — M25619 Stiffness of unspecified shoulder, not elsewhere classified: Secondary | ICD-10-CM | POA: Insufficient documentation

## 2014-07-03 DIAGNOSIS — E119 Type 2 diabetes mellitus without complications: Secondary | ICD-10-CM | POA: Insufficient documentation

## 2014-07-03 DIAGNOSIS — F17200 Nicotine dependence, unspecified, uncomplicated: Secondary | ICD-10-CM | POA: Insufficient documentation

## 2014-07-03 DIAGNOSIS — IMO0001 Reserved for inherently not codable concepts without codable children: Secondary | ICD-10-CM | POA: Insufficient documentation

## 2014-07-03 DIAGNOSIS — I10 Essential (primary) hypertension: Secondary | ICD-10-CM | POA: Insufficient documentation

## 2014-07-03 DIAGNOSIS — M25512 Pain in left shoulder: Secondary | ICD-10-CM

## 2014-07-03 DIAGNOSIS — R209 Unspecified disturbances of skin sensation: Secondary | ICD-10-CM | POA: Insufficient documentation

## 2014-07-03 DIAGNOSIS — M6281 Muscle weakness (generalized): Secondary | ICD-10-CM | POA: Insufficient documentation

## 2014-07-03 DIAGNOSIS — M25519 Pain in unspecified shoulder: Secondary | ICD-10-CM | POA: Insufficient documentation

## 2014-07-03 DIAGNOSIS — M25612 Stiffness of left shoulder, not elsewhere classified: Secondary | ICD-10-CM

## 2014-07-03 NOTE — Progress Notes (Signed)
Occupational Therapy Treatment Patient Details  Name: Amber Harris MRN: [id number] Date of Birth: March 17, 1961  Today's Date: 07/03/2014 Time: 2952-8413 OT Time Calculation (min): 44 min MFR 2440-1027 14' Therex 1320-1350 30'  Visit#: 2 of 24  Re-eval: 07/22/14    Authorization: Self Pay  Authorization Time Period: Patient is approved for the [facility] discount through 07/14/14. (Pt has signed financial awareness paper.)  Authorization Visit#:   of    Subjective Symptoms/Limitations Symptoms: S: I did the exercises. Some of them hurt. Pain Assessment Currently in Pain?: Yes Pain Score: 4  Pain Location: Shoulder Pain Orientation: Left Pain Type: Acute pain  Precautions/Restrictions  Precautions Precautions: None  Exercise/Treatments Supine Protraction: PROM;AAROM;10 reps Horizontal ABduction: PROM;10 reps External Rotation: PROM;AAROM;10 reps Internal Rotation: PROM;AAROM;10 reps Flexion: PROM;AAROM;10 reps ABduction: PROM Therapy Ball Flexion: 15 reps ABduction: 5 reps    Manual Therapy Manual Therapy: Myofascial release Myofascial Release: Myofascial release and manual stretching to left upper arm, trapezius and scapularis region to decrease fascial restrictions and increase joint mobility in a pain free zone.   Occupational Therapy Assessment and Plan OT Assessment and Plan Clinical Impression Statement: A: Initiated myofascial release, manual stretching, AAROM supine and ball stretches,. Pt requires increased time to complete all exercises which did not allow enough time to complete all exercises planned. Patient had some noted pain with AAROM exercises that were performed and overall tolerated well with increased time.  OT Plan: P: Attempt to complete all supine exercises AAROM. Complete all therapy ball stretches 15 times. Add thumb tacks and pro/ret/elev/dep if time allows.   Goals Home Exercise Program Pt/caregiver will Perform Home Exercise Program:  For increased ROM;For increased strengthening Short Term Goals Time to Complete Short Term Goals: 6 weeks Short Term Goal 1: Pt will be educated on HEP. Short Term Goal 1 Progress: Progressing toward goal Short Term Goal 2: Pt will achieve LUE PROM WFL in working towards improved ability to make her bed Short Term Goal 2 Progress: Progressing toward goal Short Term Goal 3: Pt will achieve atleast 50% range with LUE AROM in working towards ability to make her bed Short Term Goal 3 Progress: Progressing toward goal Short Term Goal 4: Pt will have pain of less than 4/10 during daily activites involving LUE AROM. Short Term Goal 4 Progress: Progressing toward goal Short Term Goal 5: Pt will improve LUE grip strength to at least 32 lbs for pt verbal report of 50% less drops. Short Term Goal 5 Progress: Progressing toward goal Additional Short Term Goals?: Yes Short Term Goal 6: Pt will improve LUE fascial restrictions to min-moderate amount for decreased pain in ROM Short Term Goal 6 Progress: Progressing toward goal Long Term Goals Time to Complete Long Term Goals: 12 weeks Long Term Goal 1: Pt will achieve prior level of functioning in all ADL, IADL, and leisure activites. Long Term Goal 1 Progress: Progressing toward goal Long Term Goal 2: Pt will achieve LUE AROM WFL for improved ability to reach into kitchen cabinets. Long Term Goal 2 Progress: Progressing toward goal Long Term Goal 3: Pt will have pain of less than 2/10 during daily activites using LUE Long Term Goal 3 Progress: Progressing toward goal Long Term Goal 4: Pt will decrease fascial restrictions to trace-min for decreased pain in daily activites and ROM Long Term Goal 4 Progress: Progressing toward goal Long Term Goal 5: Pt will achieve strength of 4/5 in LUE for ability to safely pick up grandson. Long Term Goal 5  Progress: Progressing toward goal  Problem List Patient Active Problem List   Diagnosis Date Noted  . Muscle  weakness (generalized) 06/24/2014  . Pain in joint, shoulder region 06/24/2014  . Decreased range of motion of left shoulder 06/24/2014  . Renal colic on right side 09/19/2013  . Hyperlipidemia LDL goal <100 10/11/2012  . Tobacco user   . Aortic valve disease   . Dyspnea on exertion   . Palpitations   . Depression with anxiety   . Syncope   . Diabetes mellitus, type II   . Hypertension   . Obesity     End of Session Activity Tolerance: Patient tolerated treatment well General Behavior During Therapy: [facility] for tasks assessed/performed  Limmie Patricia, OTR/L,CBIS   07/03/2014, 1:58 PM

## 2014-07-10 ENCOUNTER — Ambulatory Visit (HOSPITAL_COMMUNITY)
Admission: RE | Admit: 2014-07-10 | Discharge: 2014-07-10 | Disposition: A | Discharge status: Home / Self Care | Payer: Self-pay | Source: Ambulatory Visit | Attending: Physician Assistant | Admitting: Physician Assistant

## 2014-07-10 NOTE — Progress Notes (Signed)
Occupational Therapy Treatment Patient Details  Name: Amber ZOLLICOFFER MRN: [id number] Date of Birth: 01-20-1961  Today's Date: 07/10/2014 Time: 9629-5284 OT Time Calculation (min): 40 min Manual 1324-4010 (16') Therapeutic Exercises 1325-1348 (23')  Visit#: 3 of 24  Re-eval: 07/22/14    Authorization: Self Pay  Authorization Time Period: Patient is approved for the [facility] discount through 07/14/14. (Pt has signed financial awareness paper.)  Authorization Visit#:   of    Subjective Symptoms/Limitations Symptoms: "I can just get it up here now, so thats doing good." Pain Assessment Currently in Pain?: Yes Pain Score: 2  Pain Location: Shoulder Pain Orientation: Left Pain Type: Acute pain  Precautions/Restrictions     Exercise/Treatments Supine Protraction: PROM;AAROM;10 reps External Rotation: PROM;AAROM;10 reps Internal Rotation: PROM;AAROM;10 reps Flexion: PROM;AAROM;10 reps   Manual Therapy Manual Therapy: Myofascial release Myofascial Release: Myofascial release and manual stretching to left upper arm, trapezius and scapularis region to decrease fascial restrictions and increase joint mobility in a pain free zone.     Occupational Therapy Assessment and Plan OT Assessment and Plan Clinical Impression Statement: Pt presenting with less pain this session.  Pt continues to require extra time to complete exercises, but was able to tolerate AAROM horizontal abduction and abduction this session.  Pt had very good form with all exercises. Was not able to add furthe exercises due to need for increased time with AAROM. OT Plan: P: Complete all therapy ball stretches 15 times. Add thumb tacks and pro/ret/elev/dep if time allows.   Goals Short Term Goals Short Term Goal 1: Pt will be educated on HEP. Short Term Goal 1 Progress: Progressing toward goal Short Term Goal 2: Pt will achieve LUE PROM WFL in working towards improved ability to make her bed Short Term Goal 2  Progress: Progressing toward goal Short Term Goal 3: Pt will achieve atleast 50% range with LUE AROM in working towards ability to make her bed Short Term Goal 3 Progress: Progressing toward goal Short Term Goal 4: Pt will have pain of less than 4/10 during daily activites involving LUE AROM. Short Term Goal 4 Progress: Progressing toward goal Short Term Goal 5: Pt will improve LUE grip strength to at least 32 lbs for pt verbal report of 50% less drops. Short Term Goal 5 Progress: Progressing toward goal Short Term Goal 6: Pt will improve LUE fascial restrictions to min-moderate amount for decreased pain in ROM Short Term Goal 6 Progress: Progressing toward goal Long Term Goals Long Term Goal 1: Pt will achieve prior level of functioning in all ADL, IADL, and leisure activites. Long Term Goal 1 Progress: Progressing toward goal Long Term Goal 2: Pt will achieve LUE AROM WFL for improved ability to reach into kitchen cabinets. Long Term Goal 2 Progress: Progressing toward goal Long Term Goal 3: Pt will have pain of less than 2/10 during daily activites using LUE Long Term Goal 3 Progress: Progressing toward goal Long Term Goal 4: Pt will decrease fascial restrictions to trace-min for decreased pain in daily activites and ROM Long Term Goal 4 Progress: Progressing toward goal Long Term Goal 5: Pt will achieve strength of 4/5 in LUE for ability to safely pick up grandson. Long Term Goal 5 Progress: Progressing toward goal  Problem List Patient Active Problem List   Diagnosis Date Noted  . Muscle weakness (generalized) 06/24/2014  . Pain in joint, shoulder region 06/24/2014  . Decreased range of motion of left shoulder 06/24/2014  . Renal colic on right side 09/19/2013  .  Hyperlipidemia LDL goal <100 10/11/2012  . Tobacco user   . Aortic valve disease   . Dyspnea on exertion   . Palpitations   . Depression with anxiety   . Syncope   . Diabetes mellitus, type II   . Hypertension   .  Obesity     End of Session Activity Tolerance: Patient tolerated treatment well General Behavior During Therapy: [facility] for tasks assessed/performed  GO    Marry Guan, MS, OTR/L [phone]  07/10/2014, 3:18 PM

## 2014-07-12 ENCOUNTER — Other Ambulatory Visit (HOSPITAL_COMMUNITY): Payer: Self-pay | Admitting: Physician Assistant

## 2014-07-12 DIAGNOSIS — Z1231 Encounter for screening mammogram for malignant neoplasm of breast: Secondary | ICD-10-CM

## 2014-07-17 ENCOUNTER — Ambulatory Visit (HOSPITAL_COMMUNITY)
Admission: RE | Admit: 2014-07-17 | Discharge: 2014-07-17 | Disposition: A | Discharge status: Home / Self Care | Payer: Self-pay | Source: Ambulatory Visit | Attending: Physician Assistant | Admitting: Physician Assistant

## 2014-07-17 DIAGNOSIS — Z1231 Encounter for screening mammogram for malignant neoplasm of breast: Secondary | ICD-10-CM

## 2014-07-17 DIAGNOSIS — M6281 Muscle weakness (generalized): Secondary | ICD-10-CM

## 2014-07-17 DIAGNOSIS — M25512 Pain in left shoulder: Secondary | ICD-10-CM

## 2014-07-17 DIAGNOSIS — M25612 Stiffness of left shoulder, not elsewhere classified: Secondary | ICD-10-CM

## 2014-07-17 NOTE — Progress Notes (Signed)
Occupational Therapy Treatment Patient Details  Name: JANILLE SHAHID MRN: [id number] Date of Birth: June 22, 1961  Today's Date: 07/17/2014 Time: 1478-2956 OT Time Calculation (min): 43 min MFR 2130-8657 13' Therex 8469-6295 30'  Visit#: 4 of 24  Re-eval: 07/22/14    Authorization: Self Pay  Authorization Time Period: Patient is approved for the [facility] discount through 07/14/14. (Pt has signed financial awareness paper.)  Authorization Visit#:   of    Subjective Symptoms/Limitations Symptoms: S: I mowed my yard yesterday so I am sore from that. Pain Assessment Currently in Pain?: No/denies  Precautions/Restrictions  Precautions Precautions: None  Exercise/Treatments Supine Protraction: PROM;AAROM;10 reps Horizontal ABduction: PROM;AAROM;10 reps External Rotation: PROM;AAROM;10 reps Internal Rotation: PROM;AAROM;10 reps Flexion: PROM;AAROM;10 reps ABduction: PROM;AAROM;10 reps ROM / Strengthening / Isometric Strengthening Thumb Tacks: 1' Prot/Ret//Elev/Dep: 1'       Manual Therapy Manual Therapy: Myofascial release Myofascial Release: Myofascial release and manual stretching to left upper arm, trapezius and scapularis region to decrease fascial restrictions and increase joint mobility in a pain free zone  Occupational Therapy Assessment and Plan OT Assessment and Plan Clinical Impression Statement: A: Added thumb tacks, wall wash, and pro/ret/elev/dep. Patient tolerated well. More discomfort was due to tightness. Updated HEP to include AAROM.  OT Plan: P: Cont to complete AAROM supine and standing. Follow up on HEP.   Goals Short Term Goals Time to Complete Short Term Goals: 6 weeks Short Term Goal 1: Pt will be educated on HEP. Short Term Goal 1 Progress: Progressing toward goal Short Term Goal 2: Pt will achieve LUE PROM WFL in working towards improved ability to make her bed Short Term Goal 2 Progress: Progressing toward goal Short Term Goal 3: Pt will  achieve atleast 50% range with LUE AROM in working towards ability to make her bed Short Term Goal 3 Progress: Progressing toward goal Short Term Goal 4: Pt will have pain of less than 4/10 during daily activites involving LUE AROM. Short Term Goal 4 Progress: Progressing toward goal Short Term Goal 5: Pt will improve LUE grip strength to at least 32 lbs for pt verbal report of 50% less drops. Short Term Goal 5 Progress: Progressing toward goal Short Term Goal 6: Pt will improve LUE fascial restrictions to min-moderate amount for decreased pain in ROM Short Term Goal 6 Progress: Progressing toward goal Long Term Goals Time to Complete Long Term Goals: 12 weeks Long Term Goal 1: Pt will achieve prior level of functioning in all ADL, IADL, and leisure activites. Long Term Goal 1 Progress: Progressing toward goal Long Term Goal 2: Pt will achieve LUE AROM WFL for improved ability to reach into kitchen cabinets. Long Term Goal 2 Progress: Progressing toward goal Long Term Goal 3: Pt will have pain of less than 2/10 during daily activites using LUE Long Term Goal 3 Progress: Progressing toward goal Long Term Goal 4: Pt will decrease fascial restrictions to trace-min for decreased pain in daily activites and ROM Long Term Goal 4 Progress: Progressing toward goal Long Term Goal 5: Pt will achieve strength of 4/5 in LUE for ability to safely pick up grandson. Long Term Goal 5 Progress: Progressing toward goal  Problem List Patient Active Problem List   Diagnosis Date Noted  . Muscle weakness (generalized) 06/24/2014  . Pain in joint, shoulder region 06/24/2014  . Decreased range of motion of left shoulder 06/24/2014  . Renal colic on right side 09/19/2013  . Hyperlipidemia LDL goal <100 10/11/2012  . Tobacco user   .  Aortic valve disease   . Dyspnea on exertion   . Palpitations   . Depression with anxiety   . Syncope   . Diabetes mellitus, type II   . Hypertension   . Obesity     End  of Session Activity Tolerance: Patient tolerated treatment well General Behavior During Therapy: WFL for tasks assessed/performed OT Plan of Care OT Home Exercise Plan: AAROM exercises OT Patient Instructions: handout (scanned) Consulted and Agree with Plan of Care: Patient   Limmie Patricia, OTR/L,CBIS   07/17/2014, 2:04 PM

## 2014-07-23 ENCOUNTER — Encounter: Payer: Self-pay | Admitting: Sports Medicine

## 2014-07-23 ENCOUNTER — Ambulatory Visit (INDEPENDENT_AMBULATORY_CARE_PROVIDER_SITE_OTHER): Payer: Self-pay | Admitting: Sports Medicine

## 2014-07-23 VITALS — BP 159/79 | Ht 66.0 in | Wt 295.0 lb

## 2014-07-23 DIAGNOSIS — M67919 Unspecified disorder of synovium and tendon, unspecified shoulder: Secondary | ICD-10-CM

## 2014-07-23 DIAGNOSIS — M75102 Unspecified rotator cuff tear or rupture of left shoulder, not specified as traumatic: Secondary | ICD-10-CM

## 2014-07-23 DIAGNOSIS — M719 Bursopathy, unspecified: Secondary | ICD-10-CM

## 2014-07-23 NOTE — Progress Notes (Signed)
Amber Harris is a 53 year old female who presents for followup of left shoulder pain. Onset of symptoms was approximately 1-1/2 months ago without any known acute injury, though she was cleaning her home. When she was seen a month ago in our clinic, we trialed a corticosteroid injection to the left subacromial space and she was referred for formal physical therapy. She has completed 4 sessions of therapy.   Today she is much improved. She is able to move her arm with significantly less pain. When she does have pain she localizes most of her pain to the left lateral shoulder. Her pain is aggravated with shoulder abduction or overhead movements. She denies any associated weakness in her hand and left middle finger numbness. She denies any neck pain or radicular symptoms.   She is otherwise not currently working in the waiting disability for COPD and heart disease. She is currently smoking one pack of cigarettes per day, which he says is less than her previous 3 packs per day.   Past medical, family, medications, allergies, social histories were reviewed and are relevant as stated above or otherwise noncontributory.   Review of Systems  Pertinent positives: Left shoulder pain, Pertinent negatives: Fevers, chills, numbness, tingling, weakness headache, malaise, myalgias, incoordination  An 11 point review of systems was performed and is otherwise negative.   Objective:   Physical Exam  LMP 10/25/2011 GEN: The patient is well-developed well-nourished female and in no acute distress.  She is awake alert and oriented x3. SKIN: warm and well-perfused, no rash  EXTR: No lower extremity edema or calf tenderness Neuro: Strength 5/5 globally. Sensation intact throughout. DTRs 2/4 bilaterally. No focal deficits. Vasc: +2 bilateral distal pulses. No edema.  MSK: Examination of the left shoulder reveals the following range of motion: Abducted external rotation to 45 bilaterally without pain. Forward flexion to  170 without pain. Abduction to 175 without pain. Extension and internal rotation to the level of L1. Abducted external rotation to 90 without pain. Abducted internal rotation to 60 without pain. Positive Jobes test on the left mainly positive for weakness. Slightly positive Hawkins test. Negative speeds test.    Assessment & Plan:   1. Left rotator cuff syndrome, improving. Status post corticosteroid injection on 06/18/14 and physical therapy.   -She will continue her home exercise regimen.  -She should discontinue ibuprofen, as she also expressed that this is upsetting her stomach.  -She may continue extra strength Tylenol as needed for pain.  -At this point we discussed expected course, including continued improvement over the next one to 2 months as long as she continues home exercises and range of motion stretches.  -She is to followup if her symptoms do not continue to improve over the next one to 2 months and we will reevaluate as needed.

## 2014-07-24 ENCOUNTER — Ambulatory Visit (HOSPITAL_COMMUNITY)
Admission: RE | Admit: 2014-07-24 | Discharge: 2014-07-24 | Disposition: A | Discharge status: Home / Self Care | Payer: Self-pay | Source: Ambulatory Visit | Attending: Physician Assistant | Admitting: Physician Assistant

## 2014-07-24 NOTE — Evaluation (Signed)
Occupational Therapy Re-Evaluation and Discharge  Patient Details  Name: Amber Harris MRN: [id number] Date of Birth: 1961/11/03  Today's Date: 07/24/2014 Time: 0093-8182 OT Time Calculation (min): 20 min Self-Care 1327-1337 (10') ROM Measurements 1337-1347 (10')  Visit#: 5 of 24  Re-eval:    Assessment Diagnosis: Osteoarthritis of L shoulder, rotator cuff syndrome  Authorization: Self Pay  Authorization Time Period: Patient is approved for the [place] discount through 07/14/14. (Pt has signed financial awareness paper.)  Authorization Visit#:   of     Past Medical History:  Past Medical History  Diagnosis Date  . Syncope     exertional  . Diabetes mellitus   . Hypertension   . Obesity   . Asthma     Uses p.r.n. albuterol  . Tobacco user     30 pack years; 04/2011: 1/4 pack per day during quick attempt  . Aortic valve disease     Long-standing systolic murmur  . Dyspnea on exertion     poor exercise tolerance  . Palpitations   . Depression with anxiety   . Fibroids     uterine; postmenopausal bleeding  . Degenerative joint disease     right knee  . COPD (chronic obstructive pulmonary disease)   . Headache(784.0)   . Depression   . Bronchitis    Past Surgical History:  Past Surgical History  Procedure Laterality Date  . Tubal ligation    . Abdominal hysterectomy  12/21/2011    Procedure: HYSTERECTOMY ABDOMINAL;  Surgeon: Tilda Burrow, MD;  Location: [facility];  Service: Gynecology;  Laterality: N/A;    Subjective Symptoms/Limitations Symptoms: "The doctor released me from him, and said I could be done with therapy." Special Tests: FOTO 39/100 (initial 47/100) Pain Assessment Currently in Pain?: No/denies  Assessment ADL/Vision/Perception ADL ADL Comments: pt reports improvements overall in LUE functioanl use.  she is able to pick up her granddaughter with no pain.  Additional Assessments LUE AROM (degrees) LUE Overall AROM Comments: Assess in  supine, with ER/IR shoulder adducted Left Shoulder Flexion: 158 Degrees (29) Left Shoulder ABduction: 157 Degrees (37) Left Shoulder Internal Rotation: 92 Degrees (88) Left Shoulder External Rotation: 75 Degrees (-28) LUE Strength LUE Overall Strength Comments: Assess in sititing, with ER/IR adducted Left Shoulder Flexion:  (4+/5) Left Shoulder ABduction: 4/5 Left Shoulder Internal Rotation:  (4+/5) Left Shoulder External Rotation:  (4+/5)    Activities of Daily Living Activities of Daily Living: Follow-ed up with pt regarding current HEP.  Pt demonstrated dowel exercise with min diffiuclty.  Provided and educated pt on further AROm/strengthening exercises to complete as able.  Occupational Therapy Assessment and Plan OT Assessment and Plan Clinical Impression Statement: Pt arrived late for session, and upon arrival immediately described her MD relasing ehr medically and stating she is able to finish therapy when ready.  Pt indicated she does not with to continue with therapy services at this time. Agreed to short session today to review HEP and to take measurements of progress.  Pt's AROm had returned to [facility] and strenght is [facility] throughout LUE.  Pt has met most of her OT goals, and has met her person goal of pikcing up grandchildren without significant pain.  OT Plan: Pt is discharged from OT services with HEP.  Encouraged pt to call as needed with questions.   Goals Short Term Goals Short Term Goal 1: Pt will be educated on HEP. Short Term Goal 1 Progress: Met Short Term Goal 2: Pt will achieve LUE PROM  WFL in working towards improved ability to make her bed Short Term Goal 2 Progress: Met Short Term Goal 3: Pt will achieve atleast 50% range with LUE AROM in working towards ability to make her bed Short Term Goal 3 Progress: Met Short Term Goal 4: Pt will have pain of less than 4/10 during daily activites involving LUE AROM. Short Term Goal 4 Progress: Met Short Term Goal 5: Pt will  improve LUE grip strength to at least 32 lbs for pt verbal report of 50% less drops. Short Term Goal 5 Progress: Progressing toward goal Short Term Goal 6: Pt will improve LUE fascial restrictions to min-moderate amount for decreased pain in ROM Short Term Goal 6 Progress: Met Long Term Goals Long Term Goal 1: Pt will achieve prior level of functioning in all ADL, IADL, and leisure activites. Long Term Goal 1 Progress: Partly met Long Term Goal 2: Pt will achieve LUE AROM WFL for improved ability to reach into kitchen cabinets. Long Term Goal 2 Progress: Met Long Term Goal 3: Pt will have pain of less than 2/10 during daily activites using LUE Long Term Goal 3 Progress: Met Long Term Goal 4: Pt will decrease fascial restrictions to trace-min for decreased pain in daily activites and ROM Long Term Goal 4 Progress: Progressing toward goal Long Term Goal 5: Pt will achieve strength of 4/5 in LUE for ability to safely pick up grandson. Long Term Goal 5 Progress: Progressing toward goal  Problem List Patient Active Problem List   Diagnosis Date Noted  . Muscle weakness (generalized) 06/24/2014  . Pain in joint, shoulder region 06/24/2014  . Decreased range of motion of left shoulder 06/24/2014  . Renal colic on right side 09/19/2013  . Hyperlipidemia LDL goal <100 10/11/2012  . Tobacco user   . Aortic valve disease   . Dyspnea on exertion   . Palpitations   . Depression with anxiety   . Syncope   . Diabetes mellitus, type II   . Hypertension   . Obesity     End of Session Activity Tolerance: Patient tolerated treatment well General Behavior During Therapy: WFL for tasks assessed/performed OT Plan of Care OT Home Exercise Plan: AAROM exercises    8/26 added AROM for future HEP use OT Patient Instructions: handout (scanned) Consulted and Agree with Plan of Care: Patient  GO    Marry Guan, MS, OTR/L [phone]  07/24/2014, 4:41 PM  Physician Documentation Your  signature is required to indicate approval of the treatment plan as stated above.  Please sign and either send electronically or make a copy of this report for your files and return this physician signed original.  Please mark one 1.__approve of plan  2. ___approve of plan with the following conditions.   ______________________________                                                          _____________________ Physician Signature  Date

## 2014-07-31 ENCOUNTER — Ambulatory Visit (HOSPITAL_COMMUNITY): Payer: Self-pay

## 2014-08-25 IMAGING — CR DG LUMBAR SPINE COMPLETE 4+V
5 series · 5 of 5 positions shown · non-contrast
Comparison: CT abdomen pelvis dated 08/24/2012

CLINICAL DATA: Chronic back/hip pain

LUMBAR SPINE - COMPLETE 4+ VIEW

[view not recorded (1 of 5)]
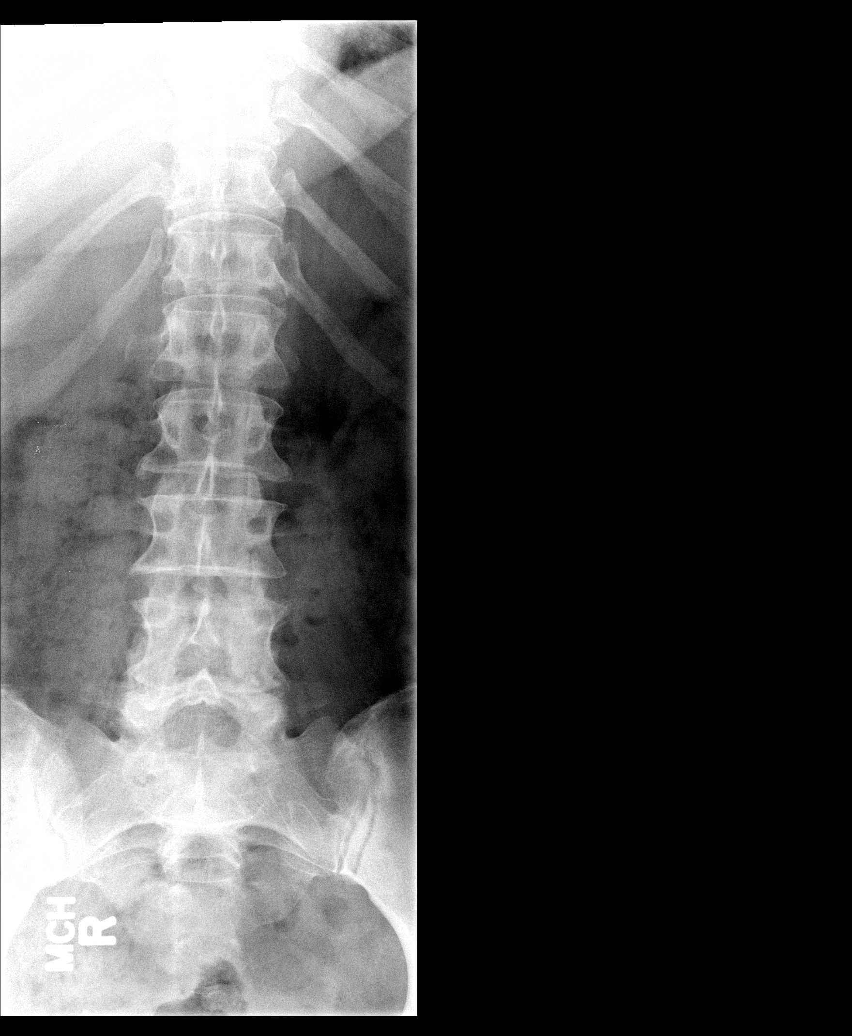

[view not recorded (2 of 5)]
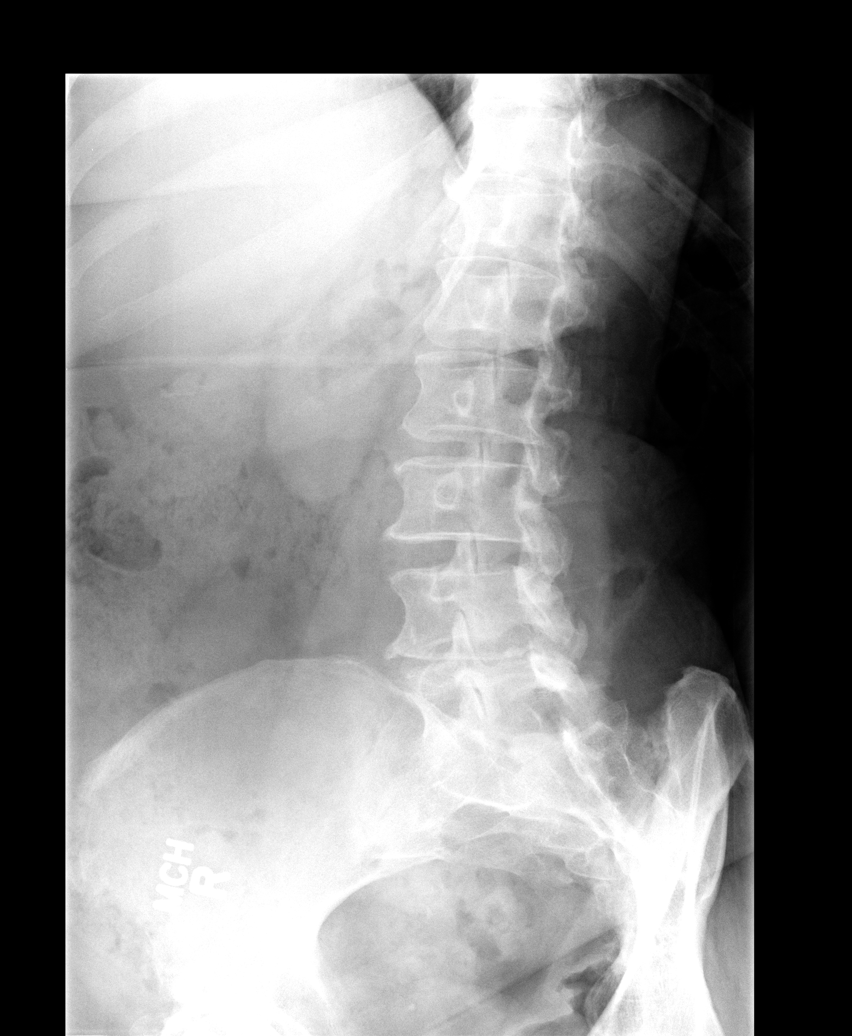

[view not recorded (3 of 5)]
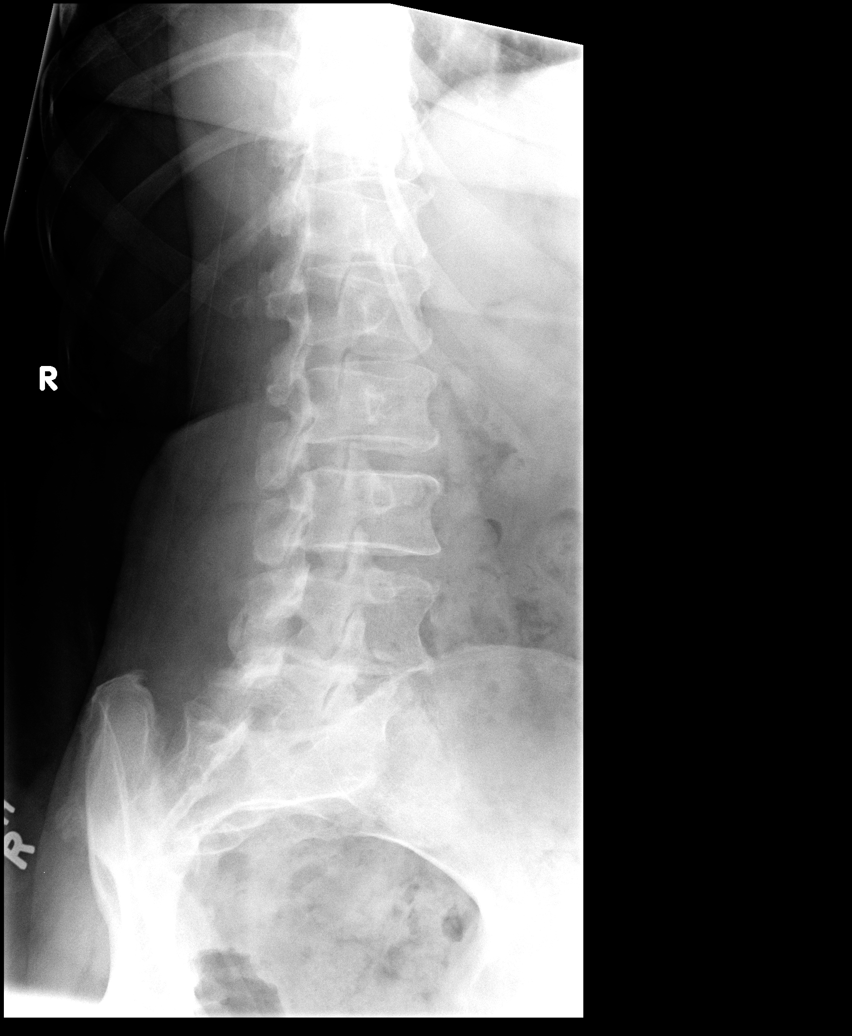

[view not recorded (4 of 5)]
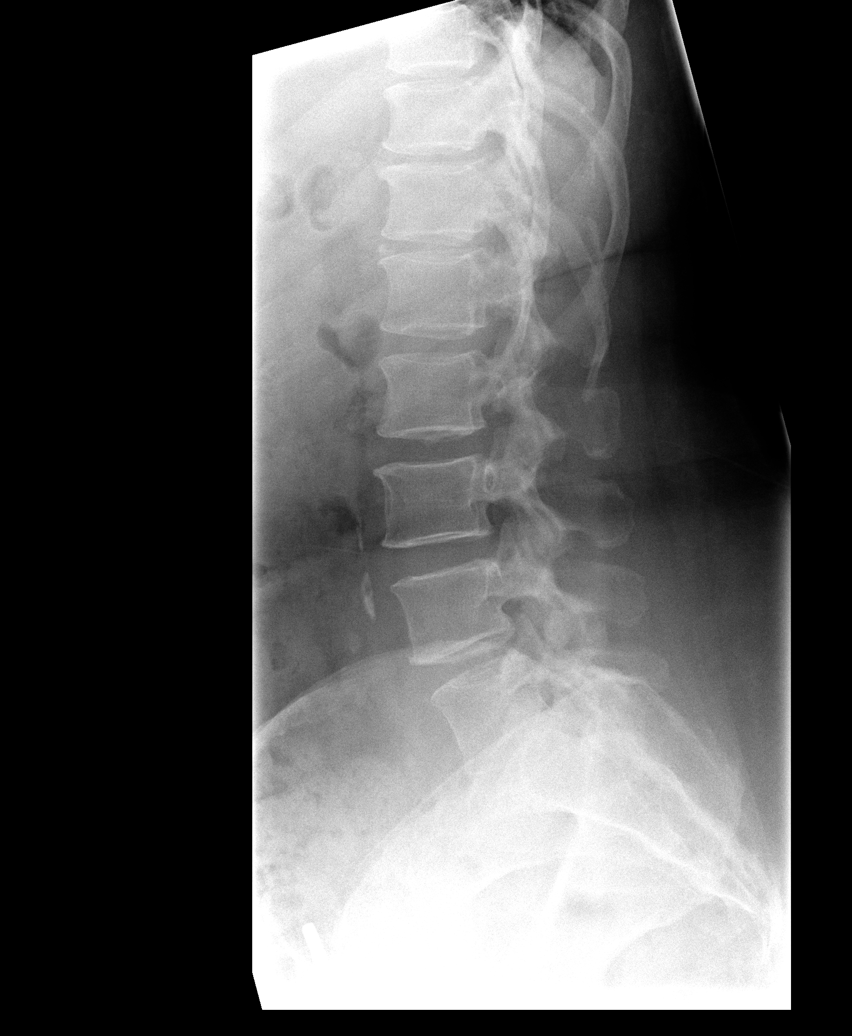

[view not recorded (5 of 5)]
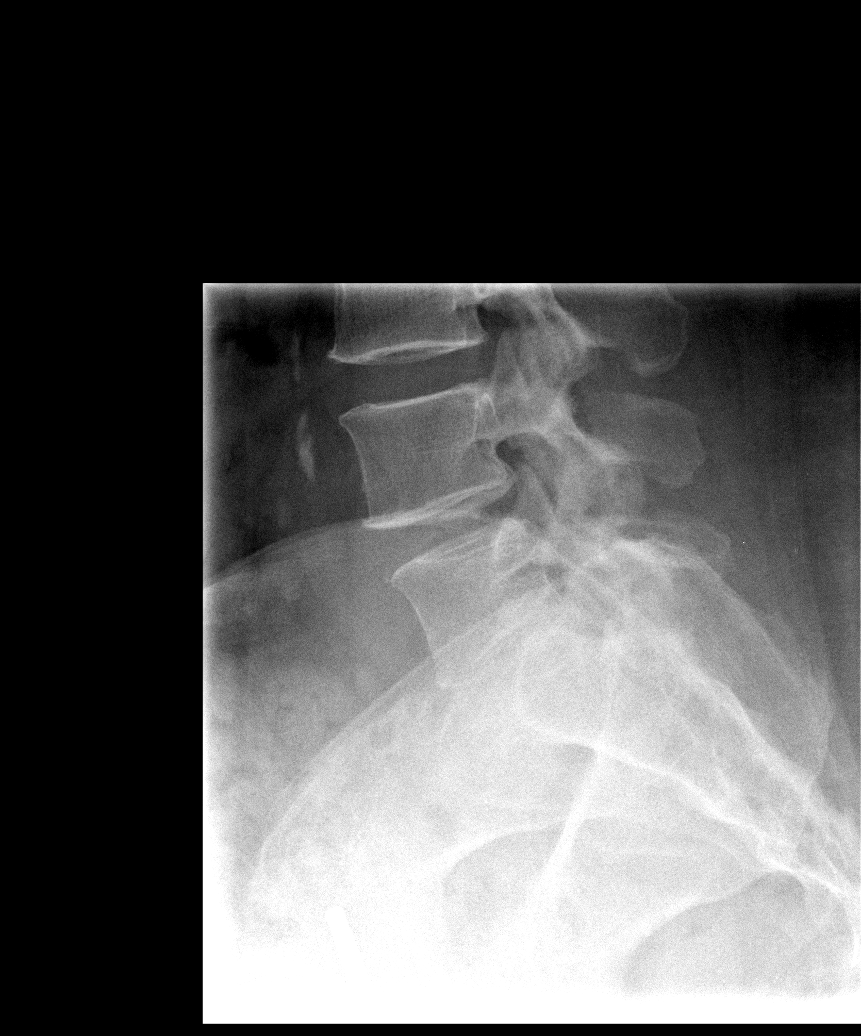

[5 of 5 positions shown; findings below may reference images not displayed]

FINDINGS: Five lumbar-type vertebral bodies.

Normal lumbar lordosis.

No evidence of fracture or dislocation.  The vertebral body heights
are maintained.

Mild multilevel degenerative changes.

Visualized bony pelvis appears intact.
IMPRESSION: No fracture or dislocation is seen.

Mild degenerative changes.

## 2014-08-25 IMAGING — CR DG HIP COMPLETE 2+V*R*
3 series · 3 of 3 positions shown · non-contrast
Comparison: None.

CLINICAL DATA: Chronic back and right hip pain, no known injury

RIGHT HIP - COMPLETE 2+ VIEW

[view not recorded (1 of 3)]
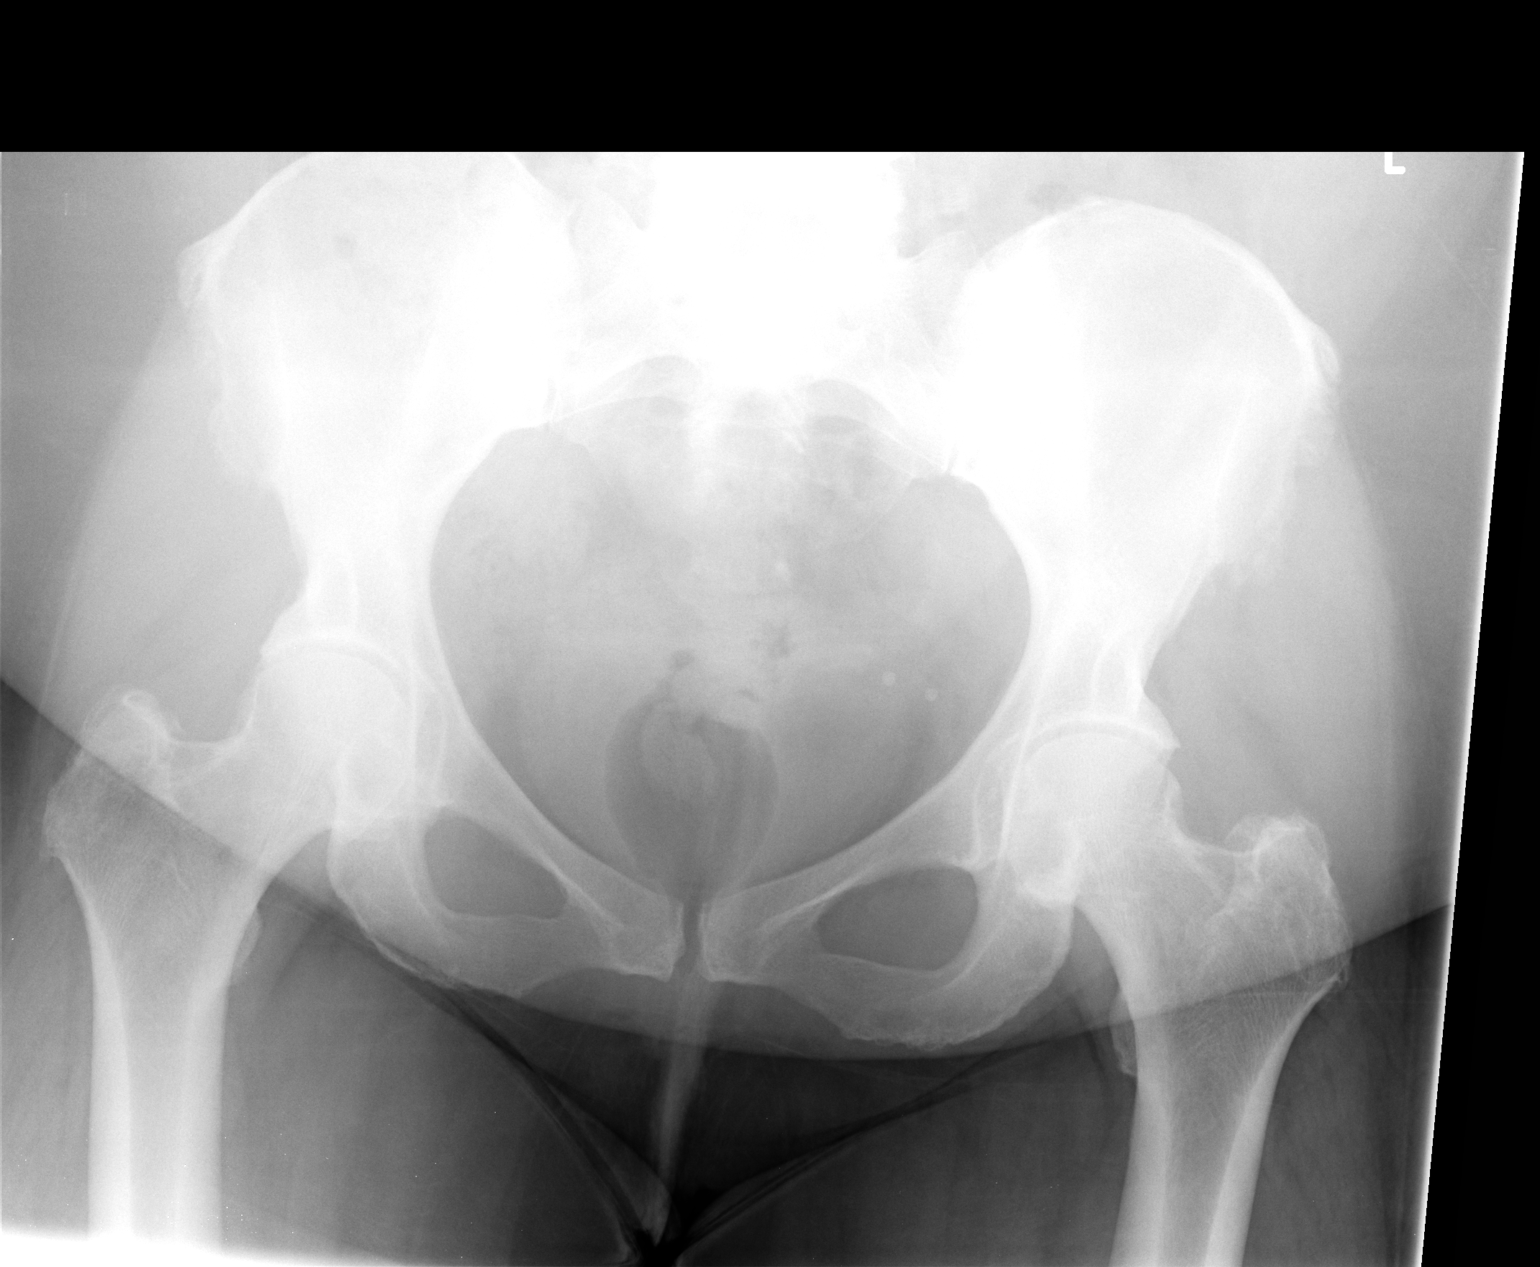

[view not recorded (2 of 3)]
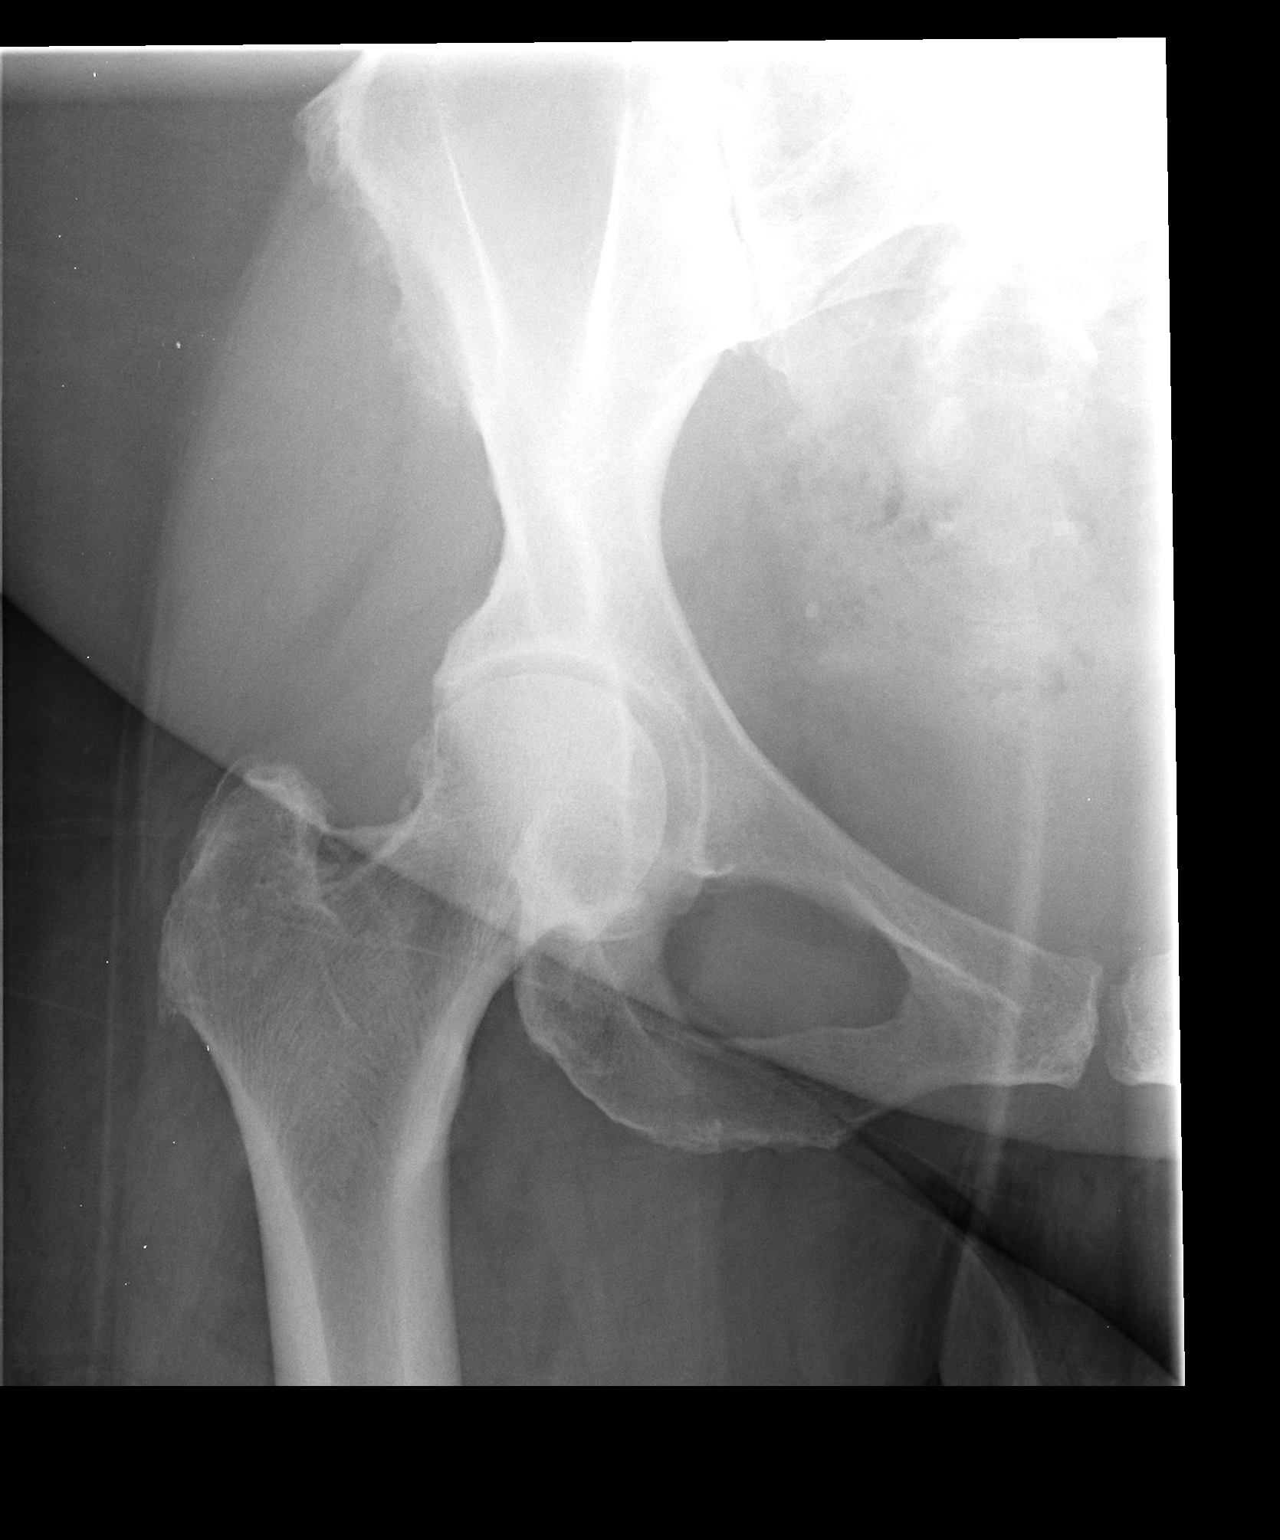

[view not recorded (3 of 3)]
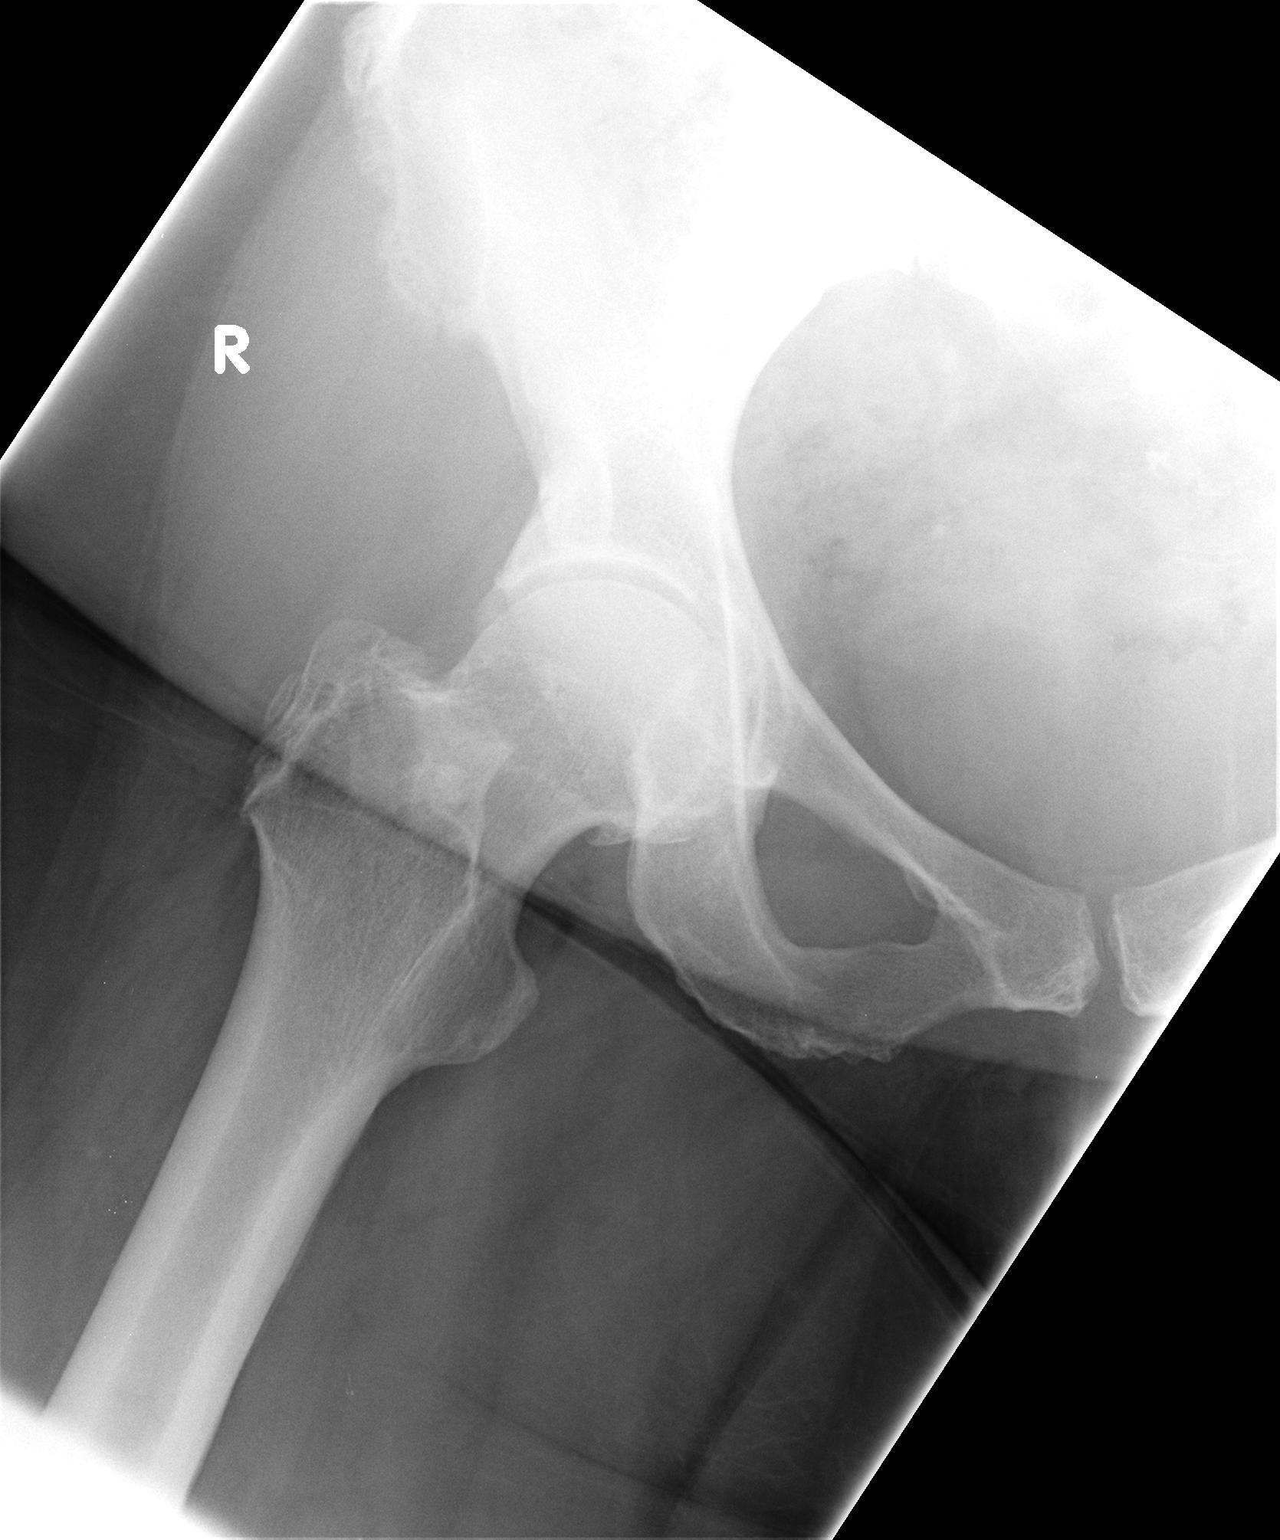

[3 of 3 positions shown; findings below may reference images not displayed]

FINDINGS: No fracture or dislocation is seen.

Mild degenerative changes of the bilateral hips.

Visualized bony pelvis appears intact.

Mild degenerative changes of the lower lumbar spine.
IMPRESSION: No fracture or dislocation is seen.

Mild degenerative changes of the bilateral hips.

## 2014-09-18 ENCOUNTER — Encounter: Payer: Self-pay | Admitting: Cardiology

## 2014-09-18 ENCOUNTER — Ambulatory Visit (INDEPENDENT_AMBULATORY_CARE_PROVIDER_SITE_OTHER): Payer: Self-pay | Admitting: Cardiology

## 2014-09-18 VITALS — BP 182/104 | HR 60 | Ht 67.0 in | Wt 282.0 lb

## 2014-09-18 DIAGNOSIS — I340 Nonrheumatic mitral (valve) insufficiency: Secondary | ICD-10-CM

## 2014-09-18 DIAGNOSIS — I359 Nonrheumatic aortic valve disorder, unspecified: Secondary | ICD-10-CM

## 2014-09-18 DIAGNOSIS — I5032 Chronic diastolic (congestive) heart failure: Secondary | ICD-10-CM

## 2014-09-18 DIAGNOSIS — E785 Hyperlipidemia, unspecified: Secondary | ICD-10-CM

## 2014-09-18 DIAGNOSIS — I10 Essential (primary) hypertension: Secondary | ICD-10-CM

## 2014-09-18 MED ORDER — VERAPAMIL HCL ER 180 MG PO TBCR: 180.0000 mg | EXTENDED_RELEASE_TABLET | Freq: Two times a day (BID) | ORAL | Status: DC

## 2014-09-18 NOTE — Progress Notes (Signed)
Clinical Summary Amber Harris is a 53 y.o.female seen today for follow up of the following medical problems.   1. Valvular heart disease  - mild AI and MR noted on prior echo 12/2009  - describes some SOB as well as LE edema. No orthopnea or PND  - recent repeat echo shows stable mild AI and MR.   2. HTN  - does not check at home  - compliant with meds   3. Hyperlipidemia  - compliant with crestor  - 12/2013 TC 173 TG 108 HDL 60 LDL 91  4. Diastolic dysfunction  - recent echo shows grade II diastolic dysfunction, reports + LE edema at times. No significant DOE.    Past Medical History  Diagnosis Date  . Syncope     exertional  . Diabetes mellitus   . Hypertension   . Obesity   . Asthma     Uses p.r.n. albuterol  . Tobacco user     30 pack years; 04/2011: 1/4 pack per day during quick attempt  . Aortic valve disease     Long-standing systolic murmur  . Dyspnea on exertion     poor exercise tolerance  . Palpitations   . Depression with anxiety   . Fibroids     uterine; postmenopausal bleeding  . Degenerative joint disease     right knee  . COPD (chronic obstructive pulmonary disease)   . Headache(784.0)   . Depression   . Bronchitis      Allergies  Allergen Reactions  . Aspirin Hives  . Penicillins Rash     Current Outpatient Prescriptions  Medication Sig Dispense Refill  . albuterol (PROVENTIL) (5 MG/ML) 0.5% nebulizer solution Take 2.5 mg by nebulization 3 (three) times daily as needed.       Marland Kitchen albuterol (PROVENTIL) 90 MCG/ACT inhaler Inhale 2 puffs into the lungs 4 (four) times daily. Asthmatic Symptoms      . cloNIDine (CATAPRES) 0.2 MG tablet Take 0.2 mg by mouth 3 (three) times daily.       . DULoxetine (CYMBALTA) 60 MG capsule Take 120 mg by mouth daily.       Marland Kitchen esomeprazole (NEXIUM) 40 MG capsule Take 40 mg by mouth daily before breakfast.      . estradiol (ESTRACE) 1 MG tablet Take 1 mg by mouth daily.      . furosemide (LASIX) 20 MG tablet  Take 2 tabs (40mg ) every morning & 1 tab (20mg ) every evening  90 tablet  6  . gabapentin (NEURONTIN) 100 MG capsule Take 100 mg by mouth 4 (four) times daily.       Marland Kitchen HYDROcodone-acetaminophen (NORCO/VICODIN) 5-325 MG per tablet Take 1 tablet by mouth every 6 (six) hours as needed.  20 tablet  0  . Insulin Glargine (LANTUS) 100 UNIT/ML Solostar Pen Inject 10 Units into the skin at bedtime.       Marland Kitchen lisinopril (PRINIVIL,ZESTRIL) 20 MG tablet Take 20 mg by mouth 2 (two) times daily.      . risperiDONE (RISPERDAL) 0.5 MG tablet Take 0.5 mg by mouth daily at 12 noon.       . rosuvastatin (CRESTOR) 20 MG tablet Take 20 mg by mouth at bedtime.       . sitaGLIPtin-metformin (JANUMET) 50-1000 MG per tablet Take 1 tablet by mouth 2 (two) times daily with a meal.      . traZODone (DESYREL) 100 MG tablet Take 200 mg by mouth at bedtime.       Marland Kitchen  verapamil (CALAN) 120 MG tablet Take 120 mg by mouth 2 (two) times daily.       No current facility-administered medications for this visit.     Past Surgical History  Procedure Laterality Date  . Tubal ligation    . Abdominal hysterectomy  12/21/2011    Procedure: HYSTERECTOMY ABDOMINAL;  Surgeon: Jonnie Kind, MD;  Location: [facility];  Service: Gynecology;  Laterality: N/A;     Allergies  Allergen Reactions  . Aspirin Hives  . Penicillins Rash      Family History  Problem Relation Age of Onset  . Lung cancer Mother   . Heart disease Brother   . Breast cancer Sister   . Heart disease Maternal Aunt   . Anesthesia problems Neg Hx   . Hypotension Neg Hx   . Malignant hyperthermia Neg Hx   . Pseudochol deficiency Neg Hx   . Cancer Cousin     lung     Social History Amber Harris reports that she has been smoking Cigarettes.  She started smoking about 39 years ago. She has a 30 pack-year smoking history. She has never used smokeless tobacco. Amber Harris reports that she does not drink alcohol.   Review of Systems CONSTITUTIONAL: No weight  loss, fever, chills, weakness or fatigue.  HEENT: Eyes: No visual loss, blurred vision, double vision or yellow sclerae.No hearing loss, sneezing, congestion, runny nose or sore throat.  SKIN: No rash or itching.  CARDIOVASCULAR: per HPI RESPIRATORY: No shortness of breath, cough or sputum.  GASTROINTESTINAL: No anorexia, nausea, vomiting or diarrhea. No abdominal pain or blood.  GENITOURINARY: No burning on urination, no polyuria NEUROLOGICAL: No headache, dizziness, syncope, paralysis, ataxia, numbness or tingling in the extremities. No change in bowel or bladder control.  MUSCULOSKELETAL: No muscle, back pain, joint pain or stiffness.  LYMPHATICS: No enlarged nodes. No history of splenectomy.  PSYCHIATRIC: No history of depression or anxiety.  ENDOCRINOLOGIC: No reports of sweating, cold or heat intolerance. No polyuria or polydipsia.  Marland Kitchen   Physical Examination p 60 bp 160/90 Wt 282 lbs BMI 44 Gen: resting comfortably, no acute distress HEENT: no scleral icterus, pupils equal round and reactive, no palptable cervical adenopathy,  CV: RRR, no m/r/g, no JVD, no carotid bruits Resp: Clear to auscultation bilaterally GI: abdomen is soft, non-tender, non-distended, normal bowel sounds, no hepatosplenomegaly MSK: extremities are warm, 1+ bilateral edema Skin: warm, no rash Neuro:  no focal deficits Psych: appropriate affect   Diagnostic Studies 10/2012 Event monitor: no arrhythmias   08/2010 Echo:  Left ventricle: The cavity size was normal. Wall thickness was  normal. Systolic function was normal. The estimated ejection  fraction was in the range of 60% to 65%. Wall motion was normal;  there were no regional wall motion abnormalities. The study is not  technically sufficient to allow evaluation of LV diastolic  function.  - Aortic valve: Mildly calcified annulus. Mild regurgitation.  - Mitral valve: Mild regurgitation.  - Tricuspid valve: Mild regurgitation.  - Pericardium,  extracardiac: There was no pericardial effusion.   11/14/13 Clinic EKG  Sinus rhythm   10/2013 Echo  LVEF 60-65%, grade II diastolic dysfunction, mild AI, mild MR   01/23/14 Clinic EKG  NSR, LAE, no ischemic changes     Assessment and Plan  1. Valvular heart disease  - mild AI and MR from prior echo 2011. Exam limited by body habitus, difficult to hear murmurs.  - repeat echo shows stable disease, continue to follow  2. HTN  - above goal, will increase verapmil to 180mg  bid - if elevated bp persists, may warrant testing for secondary HTN (renin/aldo,TSH, renal artery [place]). Sleep study in 2013 was negative.   3. Hyperlipidemia  - continue high dose statin  4. Chronic diastolic heart failure  - counseled ok to take extra lasix as needed for LE edema     F/u 6 months    Arnoldo Lenis, M.D.

## 2014-09-18 NOTE — Patient Instructions (Addendum)
   Increase Verapamil to 180mg  twice a day  Continue all other medications.   Your physician wants you to follow up in: 6 months.  You will receive a reminder letter in the mail one-two months in advance.  If you don't receive a letter, please call our office to schedule the follow up appointment.

## 2014-10-15 ENCOUNTER — Emergency Department (HOSPITAL_COMMUNITY): Payer: Self-pay

## 2014-10-15 ENCOUNTER — Encounter (HOSPITAL_COMMUNITY): Payer: Self-pay | Admitting: Emergency Medicine

## 2014-10-15 ENCOUNTER — Emergency Department (HOSPITAL_COMMUNITY)
Admission: EM | Admit: 2014-10-15 | Discharge: 2014-10-15 | Disposition: A | Discharge status: Home / Self Care | Payer: Self-pay | Attending: Emergency Medicine | Admitting: Emergency Medicine

## 2014-10-15 DIAGNOSIS — R51 Headache: Secondary | ICD-10-CM | POA: Insufficient documentation

## 2014-10-15 DIAGNOSIS — Z8679 Personal history of other diseases of the circulatory system: Secondary | ICD-10-CM | POA: Insufficient documentation

## 2014-10-15 DIAGNOSIS — F418 Other specified anxiety disorders: Secondary | ICD-10-CM | POA: Insufficient documentation

## 2014-10-15 DIAGNOSIS — Z79899 Other long term (current) drug therapy: Secondary | ICD-10-CM | POA: Insufficient documentation

## 2014-10-15 DIAGNOSIS — E119 Type 2 diabetes mellitus without complications: Secondary | ICD-10-CM | POA: Insufficient documentation

## 2014-10-15 DIAGNOSIS — Z86018 Personal history of other benign neoplasm: Secondary | ICD-10-CM | POA: Insufficient documentation

## 2014-10-15 DIAGNOSIS — Z880 Allergy status to penicillin: Secondary | ICD-10-CM | POA: Insufficient documentation

## 2014-10-15 DIAGNOSIS — I10 Essential (primary) hypertension: Secondary | ICD-10-CM | POA: Insufficient documentation

## 2014-10-15 DIAGNOSIS — Z8742 Personal history of other diseases of the female genital tract: Secondary | ICD-10-CM | POA: Insufficient documentation

## 2014-10-15 DIAGNOSIS — Z720 Tobacco use: Secondary | ICD-10-CM | POA: Insufficient documentation

## 2014-10-15 DIAGNOSIS — M1712 Unilateral primary osteoarthritis, left knee: Secondary | ICD-10-CM | POA: Insufficient documentation

## 2014-10-15 DIAGNOSIS — R0602 Shortness of breath: Secondary | ICD-10-CM

## 2014-10-15 DIAGNOSIS — E669 Obesity, unspecified: Secondary | ICD-10-CM | POA: Insufficient documentation

## 2014-10-15 DIAGNOSIS — J449 Chronic obstructive pulmonary disease, unspecified: Secondary | ICD-10-CM

## 2014-10-15 DIAGNOSIS — R011 Cardiac murmur, unspecified: Secondary | ICD-10-CM | POA: Insufficient documentation

## 2014-10-15 DIAGNOSIS — J441 Chronic obstructive pulmonary disease with (acute) exacerbation: Secondary | ICD-10-CM | POA: Insufficient documentation

## 2014-10-15 DIAGNOSIS — Z794 Long term (current) use of insulin: Secondary | ICD-10-CM | POA: Insufficient documentation

## 2014-10-15 LAB — CBC
HCT: 40 % (ref 36.0–46.0)
Hemoglobin: 13.6 g/dL (ref 12.0–15.0)
MCH: 30.9 pg (ref 26.0–34.0)
MCHC: 34 g/dL (ref 30.0–36.0)
MCV: 90.9 fL (ref 78.0–100.0)
Platelets: 182 10*3/uL (ref 150–400)
RBC: 4.4 MIL/uL (ref 3.87–5.11)
RDW: 12.7 % (ref 11.5–15.5)
WBC: 5.3 10*3/uL (ref 4.0–10.5)

## 2014-10-15 LAB — BASIC METABOLIC PANEL
Anion gap: 13 (ref 5–15)
BUN: 9 mg/dL (ref 6–23)
CO2: 25 mEq/L (ref 19–32)
Calcium: 9.1 mg/dL (ref 8.4–10.5)
Chloride: 103 mEq/L (ref 96–112)
Creatinine, Ser: 0.76 mg/dL (ref 0.50–1.10)
GFR calc non Af Amer: >90 mL/min (ref >90)
Glucose, Bld: 137 mg/dL — ABNORMAL HIGH (ref 70–99)
POTASSIUM: 3.7 meq/L (ref 3.7–5.3)
Sodium: 141 mEq/L (ref 137–147)

## 2014-10-15 LAB — TROPONIN I: Troponin I: <0.3 ng/mL (ref <0.30)

## 2014-10-15 LAB — PRO B NATRIURETIC PEPTIDE: PRO B NATRI PEPTIDE: 106.4 pg/mL (ref 0–125)

## 2014-10-15 MED ORDER — IPRATROPIUM BROMIDE 0.02 % IN SOLN
0.5000 mg | Freq: Once | RESPIRATORY_TRACT | Status: AC
  Administered 2014-10-15: 0.5 mg via RESPIRATORY_TRACT
  Filled 2014-10-15: qty 2.5

## 2014-10-15 MED ORDER — IPRATROPIUM-ALBUTEROL 0.5-2.5 (3) MG/3ML IN SOLN: 3.0000 mL | Freq: Once | RESPIRATORY_TRACT | Status: DC

## 2014-10-15 MED ORDER — ALBUTEROL SULFATE (2.5 MG/3ML) 0.083% IN NEBU: 2.5000 mg | INHALATION_SOLUTION | Freq: Once | RESPIRATORY_TRACT | Status: DC

## 2014-10-15 MED ORDER — ACETAMINOPHEN 325 MG PO TABS
650.0000 mg | ORAL_TABLET | Freq: Once | ORAL | Status: AC
  Administered 2014-10-15: 650 mg via ORAL
  Filled 2014-10-15: qty 2

## 2014-10-15 MED ORDER — PREDNISONE 20 MG PO TABS: 60.0000 mg | ORAL_TABLET | Freq: Every day | ORAL | Status: DC

## 2014-10-15 MED ORDER — ALBUTEROL (5 MG/ML) CONTINUOUS INHALATION SOLN
10.0000 mg/h | INHALATION_SOLUTION | Freq: Once | RESPIRATORY_TRACT | Status: AC
  Administered 2014-10-15: 10 mg/h via RESPIRATORY_TRACT
  Filled 2014-10-15: qty 20

## 2014-10-15 MED ORDER — METHYLPREDNISOLONE SODIUM SUCC 125 MG IJ SOLR
125.0000 mg | Freq: Once | INTRAMUSCULAR | Status: AC
  Administered 2014-10-15: 125 mg via INTRAVENOUS
  Filled 2014-10-15: qty 2

## 2014-10-15 NOTE — ED Provider Notes (Addendum)
CSN: [id number]     Arrival date & time 10/15/14  1508 History  This chart was scribed for Dorie Rank, MD by Edison Simon, ED Scribe. This patient was seen in room [facility]18 and the patient's care was started at 3:32 PM.    Chief Complaint  Patient presents with  . Shortness of Breath   The history is provided by the patient. No language interpreter was used.    HPI Comments: SECRET KRISTENSEN is a 53 y.o. female with history of asthma who presents to the Emergency Department complaining of shortness of breath upon waking at 0600 today. She reports associated chest pain and headache and states she has been coughing. She states her symptoms feel unlike previous asthma flare ups. She reports using albuterol nebulizer at home without improvement. She states she is currently smoking 2 cigarettes a day, which is greatly decreased from what she used to do. She denies fever or leg swelling. She reports a history of aortic valve leak that her doctor has decided to follow but has not yet taken other action.  Past Medical History  Diagnosis Date  . Syncope     exertional  . Diabetes mellitus   . Hypertension   . Obesity   . Asthma     Uses p.r.n. albuterol  . Tobacco user     30 pack years; 04/2011: 1/4 pack per day during quick attempt  . Aortic valve disease     Long-standing systolic murmur  . Dyspnea on exertion     poor exercise tolerance  . Palpitations   . Depression with anxiety   . Fibroids     uterine; postmenopausal bleeding  . Degenerative joint disease     right knee  . COPD (chronic obstructive pulmonary disease)   . Headache(784.0)   . Depression   . Bronchitis    Past Surgical History  Procedure Laterality Date  . Tubal ligation    . Abdominal hysterectomy  12/21/2011    Procedure: HYSTERECTOMY ABDOMINAL;  Surgeon: Jonnie Kind, MD;  Location: [facility];  Service: Gynecology;  Laterality: N/A;   Family History  Problem Relation Age of Onset  . Lung cancer Mother   .  Heart disease Brother   . Breast cancer Sister   . Heart disease Maternal Aunt   . Anesthesia problems Neg Hx   . Hypotension Neg Hx   . Malignant hyperthermia Neg Hx   . Pseudochol deficiency Neg Hx   . Cancer Cousin     lung   History  Substance Use Topics  . Smoking status: Current Every Day Smoker -- 0.10 packs/day for 30 years    Types: Cigarettes    Start date: 10/02/1974  . Smokeless tobacco: Never Used     Comment: smoking 3 cigs daily since 04-2013  . Alcohol Use: No   OB History    No data available     Review of Systems  Constitutional: Negative for fever.  Respiratory: Positive for shortness of breath.   Cardiovascular: Positive for chest pain. Negative for leg swelling.  Neurological: Positive for headaches.  All other systems reviewed and are negative.     Allergies  Aspirin and Penicillins  Home Medications   Prior to Admission medications   Medication Sig Start Date End Date Taking? Authorizing Provider  albuterol (PROVENTIL) (5 MG/ML) 0.5% nebulizer solution Take 2.5 mg by nebulization 3 (three) times daily as needed.     Historical Provider, MD  albuterol (PROVENTIL) 90 MCG/ACT inhaler  Inhale 2 puffs into the lungs 4 (four) times daily. Asthmatic Symptoms    Historical Provider, MD  cloNIDine (CATAPRES) 0.2 MG tablet Take 0.2 mg by mouth 3 (three) times daily.     Historical Provider, MD  DULoxetine (CYMBALTA) 60 MG capsule Take 120 mg by mouth daily.     Historical Provider, MD  esomeprazole (NEXIUM) 40 MG capsule Take 40 mg by mouth daily before breakfast.    Historical Provider, MD  estradiol (ESTRACE) 1 MG tablet Take 1 mg by mouth daily.    Historical Provider, MD  Darcey Nora SUSP  10/09/14   Historical Provider, MD  furosemide (LASIX) 20 MG tablet Take 2 tabs (40mg ) every morning & 1 tab (20mg ) every evening 05/21/14   Arnoldo Lenis, MD  gabapentin (NEURONTIN) 100 MG capsule Take 100 mg by mouth 4 (four) times daily.     Historical Provider, MD   HYDROcodone-acetaminophen (NORCO/VICODIN) 5-325 MG per tablet Take 1 tablet by mouth every 6 (six) hours as needed. 06/11/14   Maudry Diego, MD  Insulin Glargine (LANTUS) 100 UNIT/ML Solostar Pen Inject 10 Units into the skin at bedtime.     Historical Provider, MD  lisinopril (PRINIVIL,ZESTRIL) 20 MG tablet Take 20 mg by mouth 2 (two) times daily.    Historical Provider, MD  risperiDONE (RISPERDAL) 0.5 MG tablet Take 0.5 mg by mouth daily at 12 noon.     Historical Provider, MD  rosuvastatin (CRESTOR) 20 MG tablet Take 20 mg by mouth at bedtime.     Historical Provider, MD  sitaGLIPtin-metformin (JANUMET) 50-1000 MG per tablet Take 1 tablet by mouth 2 (two) times daily with a meal.    Historical Provider, MD  traZODone (DESYREL) 100 MG tablet Take 200 mg by mouth at bedtime.     Historical Provider, MD  verapamil (CALAN-SR) 180 MG CR tablet Take 1 tablet (180 mg total) by mouth 2 (two) times daily. 09/18/14   Arnoldo Lenis, MD   BP 149/68 mmHg  Pulse 72  Temp(Src) 98.8 F (37.1 C) (Oral)  Resp 19  Ht 5\' 7"  (1.702 m)  Wt 289 lb (131.09 kg)  BMI 45.25 kg/m2  SpO2 99%  LMP 10/25/2011 Physical Exam  Constitutional: She appears distressed.  Obese  HENT:  Head: Normocephalic and atraumatic.  Right Ear: External ear normal.  Left Ear: External ear normal.  Eyes: Conjunctivae are normal. Right eye exhibits no discharge. Left eye exhibits no discharge. No scleral icterus.  Neck: Neck supple. No tracheal deviation present.  Cardiovascular: Normal rate, regular rhythm and intact distal pulses.   Pulmonary/Chest: Accessory muscle usage present. No stridor. Tachypnea noted. She is in respiratory distress. She has decreased breath sounds. She has wheezes. She has no rales.  Not able to speak in full sentences  Abdominal: Soft. Bowel sounds are normal. She exhibits no distension. There is no tenderness. There is no rebound and no guarding.  Musculoskeletal: She exhibits no edema or  tenderness.  Neurological: She is alert. She has normal strength. No cranial nerve deficit (no facial droop, extraocular movements intact, no slurred speech) or sensory deficit. She exhibits normal muscle tone. She displays no seizure activity. Coordination normal.  Skin: Skin is warm and dry. No rash noted. She is not diaphoretic.  Psychiatric: She has a normal mood and affect.  Nursing note and vitals reviewed.   ED Course  Procedures (including critical care time)  DIAGNOSTIC STUDIES: Oxygen Saturation is 97% on room air, normal by my interpretation.  COORDINATION OF CARE: 3:34 PM Discussed treatment plan with patient at beside, the patient agrees with the plan and has no further questions at this time.   Labs Review Labs Reviewed  BASIC METABOLIC PANEL - Abnormal; Notable for the following:    Glucose, Bld 137 (*)    All other components within normal limits  CBC  PRO B NATRIURETIC PEPTIDE  TROPONIN I    Imaging Review Dg Chest Portable 1 View  10/15/2014   CLINICAL DATA:  53 year old female with shortness of breath cough chest pain and headache since 6 a.m. Initial encounter.  EXAM: PORTABLE CHEST - 1 VIEW  COMPARISON:  11/27/2012 and earlier.  FINDINGS: Portable AP upright view at 1526 hrs. Large body habitus. Normal cardiac size and mediastinal contours. Stable lung volumes. Allowing for portable technique, the lungs are clear. No pneumothorax pleural effusion. Visualized tracheal air column is within normal limits.  IMPRESSION: No acute cardiopulmonary abnormality.   Electronically Signed   By: Lars Pinks M.D.   On: 10/15/2014 15:51     EKG Interpretation   Date/Time:  Tuesday October 15 2014 15:18:12 EST Ventricular Rate:  82 PR Interval:  169 QRS Duration: 71 QT Interval:  372 QTC Calculation: 434 R Axis:   1 Text Interpretation:  Sinus rhythm Probable left atrial enlargement  Anterior infarct, old Baseline wander in lead(s) III aVF No previous  tracing  Confirmed by Mikelle Myrick  MD-J, Yurani Fettes ([id number]) on 10/15/2014 3:22:21 PM     Medications  methylPREDNISolone sodium succinate (SOLU-MEDROL) 125 mg/2 mL injection 125 mg (125 mg Intravenous Given 10/15/14 1600)  albuterol (PROVENTIL,VENTOLIN) solution continuous neb (10 mg/hr Nebulization Given 10/15/14 1558)  ipratropium (ATROVENT) nebulizer solution 0.5 mg (0.5 mg Nebulization Given 10/15/14 1558)  acetaminophen (TYLENOL) tablet 650 mg (650 mg Oral Given 10/15/14 1558)   1745  Patient is breathing easily now. No wheezing on repeat exam. She was able to walk to the bathroom without oxygen supplementation or respiratory distress MDM   Final diagnoses:  Chronic obstructive pulmonary disease, unspecified COPD, unspecified chronic bronchitis type   Patient's symptoms improved with albuterol treatments and steroids.I encouraged her to stop smoking. I will discharge her home on a course of steroids. She has albuterol inhalers and treatments to take at home. I personally performed the services described in this documentation, which was scribed in my presence.  The recorded information has been reviewed and is accurate.     Dorie Rank, MD 10/15/14 1707  Dorie Rank, MD 10/15/14 1754

## 2014-10-15 NOTE — ED Notes (Signed)
PT reports onset of SOB and wheezing today with a dry cough and chest tightness. PT reports taking a nebulizer at home with no relief.

## 2014-10-15 NOTE — Discharge Instructions (Signed)
Chronic Asthmatic Bronchitis Chronic asthmatic bronchitis is a complication of persistent asthma. After a period of time with asthma, some people develop airflow obstruction that is present all the time, even when not having an asthma attack.There is also persistent inflammation of the airways, and the bronchial tubes produce more mucus. Chronic asthmatic bronchitis usually is a permanent problem with the lungs. CAUSES  Chronic asthmatic bronchitis happens most often in people who have asthma and also smoke cigarettes. Occasionally, it can happen to a person with long-standing or severe asthma even if the person is not a smoker. SIGNS AND SYMPTOMS  Chronic asthmatic bronchitis usually causes symptoms of both asthma and chronic bronchitis, including:   Coughing.  Increased sputum production.  Wheezing and shortness of breath.  Chest discomfort.  Recurring infections. DIAGNOSIS  Your health care provider will take a medical history and perform a physical exam. Chronic asthmatic bronchitis is suspected when a person with asthma has abnormal results on breathing tests (pulmonary function tests) even when breathing symptoms are at their best. Other tests, such as a chest X-ray, may be performed to rule out other conditions.  TREATMENT  Treatment involves controlling symptoms with medicine and lifestyle changes.  Your health care provider may prescribe asthma medicines, including inhaler and nebulizer medicines.  Infection can be treated with medicine to kill germs (antibiotics). Serious infections may require hospitalization. These can include:  Pneumonia.  Sinus infections.  Acute bronchitis.   Preventing infection and hospitalization is very important. Get an influenza vaccination every year as directed by your health care provider. Ask your health care provider whether you need a pneumonia vaccine.  Ask your health care provider whether you would benefit from a pulmonary  rehabilitation program. HOME CARE INSTRUCTIONS  Take medicines only as directed by your health care provider.  If you are a cigarette smoker, the most important thing that you can do is quit. Talk to your health care provider for help with quitting smoking.  Avoid pollen, dust, animal dander, molds, smoke, and other things that cause attacks.  Regular exercise is very important to help you feel better. Discuss possible exercise routines with your health care provider.  If animal dander is the cause of asthma, you may not be able to keep pets.  It is important that you:  Become educated about your medical condition.  Participate in maintaining wellness.  Seek medical care as directed. Delay in seeking medical care could cause permanent injury and may be a risk to your life. SEEK MEDICAL CARE IF:  You have wheezing and shortness of breath even if taking medicine to prevent attacks.  You have muscle aches, chest pain, or thickening of sputum.  Your sputum changes from clear or white to yellow, green, gray, or bloody. SEEK IMMEDIATE MEDICAL CARE IF:  Your usual medicines do not stop your wheezing.  You have increased coughing or shortness of breath or both.  You have increased difficulty breathing.  You have any problems from the medicine you are taking, such as a rash, itching, swelling, or trouble breathing. MAKE SURE YOU:   Understand these instructions.  Will watch your condition.  Will get help right away if you are not doing well or get worse. Document Released: 09/02/2006 Document Revised: 04/01/2014 Document Reviewed: 12/24/2013 [employer] Patient Information 2015 [employer]. This information is not intended to replace advice given to you by your health care provider. Make sure you discuss any questions you have with your health care provider.  

## 2014-12-04 ENCOUNTER — Telehealth: Payer: Self-pay | Admitting: *Deleted

## 2014-12-04 NOTE — Telephone Encounter (Signed)
Kim from the [employer] called and states that with the program that the patient is in her Verapamil is too expensive. They suggest Verapamil extended release 180 mg Daily, or Verapamil immediate release 2 of the 80 mg twice daily. Please advise.

## 2014-12-05 MED ORDER — VERAPAMIL HCL 80 MG PO TABS
160.0000 mg | ORAL_TABLET | Freq: Two times a day (BID) | ORAL | Status: DC
Start: 2014-12-05 — End: 2020-09-24

## 2014-12-05 NOTE — Telephone Encounter (Signed)
Spoke with Maudie Mercury at the [facility], instructed her that [place] would like the Verapamil 80mg  immediate release, 2 tabs twice daily. Maudie Mercury states that [facility] will [facility] in due to the fact they have a coupon.

## 2014-12-05 NOTE — Telephone Encounter (Signed)
We can change her to the verapamil 80mg  immediate release, 2 tablets twice daily   Zandra Abts MD

## 2015-03-06 ENCOUNTER — Ambulatory Visit: Payer: Self-pay | Admitting: Cardiology

## 2015-03-14 ENCOUNTER — Telehealth: Payer: Self-pay | Admitting: *Deleted

## 2015-03-14 ENCOUNTER — Encounter: Payer: Self-pay | Admitting: *Deleted

## 2015-03-14 ENCOUNTER — Ambulatory Visit: Payer: Self-pay | Admitting: Cardiology

## 2015-03-14 ENCOUNTER — Encounter: Payer: Self-pay | Admitting: Cardiology

## 2015-03-14 ENCOUNTER — Ambulatory Visit (INDEPENDENT_AMBULATORY_CARE_PROVIDER_SITE_OTHER): Payer: Self-pay | Admitting: Cardiology

## 2015-03-14 VITALS — BP 142/100 | HR 68 | Ht 67.0 in | Wt 296.4 lb

## 2015-03-14 DIAGNOSIS — I5032 Chronic diastolic (congestive) heart failure: Secondary | ICD-10-CM

## 2015-03-14 DIAGNOSIS — I10 Essential (primary) hypertension: Secondary | ICD-10-CM

## 2015-03-14 DIAGNOSIS — E785 Hyperlipidemia, unspecified: Secondary | ICD-10-CM

## 2015-03-14 DIAGNOSIS — I340 Nonrheumatic mitral (valve) insufficiency: Secondary | ICD-10-CM

## 2015-03-14 DIAGNOSIS — I359 Nonrheumatic aortic valve disorder, unspecified: Secondary | ICD-10-CM

## 2015-03-14 MED ORDER — HYDROCHLOROTHIAZIDE 25 MG PO TABS: 25.0000 mg | ORAL_TABLET | Freq: Every day | ORAL | Status: DC

## 2015-03-14 MED ORDER — FUROSEMIDE 40 MG PO TABS: ORAL_TABLET | ORAL | Status: DC

## 2015-03-14 MED ORDER — CHLORTHALIDONE 25 MG PO TABS: 25.0000 mg | ORAL_TABLET | Freq: Every day | ORAL | Status: DC

## 2015-03-14 NOTE — Progress Notes (Signed)
Clinical Summary Ms. Pluta is a 54 y.o.female seen today for follow up of the following medical problems.   1. Valvular heart disease  - mild AI and MR noted on prior echo 12/2009 and 10/2013 - describes stable SOB as well as LE edema. No orthopnea or PND  - recent repeat echo shows stable mild AI and MR.   2. HTN  - does not check at home  - compliant with meds   3. Hyperlipidemia  - compliant with crestor  - 12/2013 TC 173 TG 108 HDL 60 LDL 91  4. Diastolic dysfunction  - recent echo shows grade II diastolic dysfunction, reports + LE edema at times. No significant DOE.  - SOB 1/2 block which is stable. Notes some swelling in legs at times  5. COPD - still smoking - compliant with inhalers  Past Medical History  Diagnosis Date  . Syncope     exertional  . Diabetes mellitus   . Hypertension   . Obesity   . Asthma     Uses p.r.n. albuterol  . Tobacco user     30 pack years; 04/2011: 1/4 pack per day during quick attempt  . Aortic valve disease     Long-standing systolic murmur  . Dyspnea on exertion     poor exercise tolerance  . Palpitations   . Depression with anxiety   . Fibroids     uterine; postmenopausal bleeding  . Degenerative joint disease     right knee  . COPD (chronic obstructive pulmonary disease)   . Headache(784.0)   . Depression   . Bronchitis      Allergies  Allergen Reactions  . Aspirin Hives  . Penicillins Rash     Current Outpatient Prescriptions  Medication Sig Dispense Refill  . albuterol (PROVENTIL) (5 MG/ML) 0.5% nebulizer solution Take 2.5 mg by nebulization 3 (three) times daily as needed for wheezing or shortness of breath.     Marland Kitchen albuterol (PROVENTIL) 90 MCG/ACT inhaler Inhale 2 puffs into the lungs 4 (four) times daily. Asthmatic Symptoms    . cloNIDine (CATAPRES) 0.2 MG tablet Take 0.2 mg by mouth 3 (three) times daily.     . DULoxetine (CYMBALTA) 60 MG capsule Take 120 mg by mouth daily.     Marland Kitchen esomeprazole  (NEXIUM) 40 MG capsule Take 40 mg by mouth daily before breakfast.    . estradiol (ESTRACE) 1 MG tablet Take 1 mg by mouth daily.    Marland Kitchen FLUVIRIN SUSP   0  . furosemide (LASIX) 20 MG tablet Take 2 tabs (40mg ) every morning & 1 tab (20mg ) every evening (Patient taking differently: Take 20-40 mg by mouth 2 (two) times daily. Take 2 tabs (40mg ) every morning & 1 tab (20mg ) every evening) 90 tablet 6  . gabapentin (NEURONTIN) 100 MG capsule Take 100 mg by mouth 4 (four) times daily.     Marland Kitchen HYDROcodone-acetaminophen (NORCO/VICODIN) 5-325 MG per tablet Take 1 tablet by mouth every 6 (six) hours as needed. (Patient not taking: Reported on 10/15/2014) 20 tablet 0  . Insulin Glargine (LANTUS) 100 UNIT/ML Solostar Pen Inject 10 Units into the skin at bedtime.     Marland Kitchen lisinopril (PRINIVIL,ZESTRIL) 20 MG tablet Take 20 mg by mouth 2 (two) times daily.    . predniSONE (DELTASONE) 20 MG tablet Take 3 tablets (60 mg total) by mouth daily. 15 tablet 0  . risperiDONE (RISPERDAL) 0.5 MG tablet Take 0.5 mg by mouth daily at 12 noon.     Marland Kitchen  rosuvastatin (CRESTOR) 20 MG tablet Take 20 mg by mouth at bedtime.     . sitaGLIPtin-metformin (JANUMET) 50-1000 MG per tablet Take 1 tablet by mouth 2 (two) times daily with a meal.    . traZODone (DESYREL) 100 MG tablet Take 200 mg by mouth at bedtime.     . verapamil (CALAN) 80 MG tablet Take 2 tablets (160 mg total) by mouth 2 (two) times daily. 60 tablet 6   No current facility-administered medications for this visit.     Past Surgical History  Procedure Laterality Date  . Tubal ligation    . Abdominal hysterectomy  12/21/2011    Procedure: HYSTERECTOMY ABDOMINAL;  Surgeon: Jonnie Kind, MD;  Location: [facility];  Service: Gynecology;  Laterality: N/A;     Allergies  Allergen Reactions  . Aspirin Hives  . Penicillins Rash      Family History  Problem Relation Age of Onset  . Lung cancer Mother   . Heart disease Brother   . Breast cancer Sister   . Heart disease  Maternal Aunt   . Anesthesia problems Neg Hx   . Hypotension Neg Hx   . Malignant hyperthermia Neg Hx   . Pseudochol deficiency Neg Hx   . Cancer Cousin     lung     Social History Ms. Horsey reports that she has been smoking Cigarettes.  She started smoking about 40 years ago. She has a 3 pack-year smoking history. She has never used smokeless tobacco. Ms. Mortell reports that she does not drink alcohol.   Review of Systems CONSTITUTIONAL: No weight loss, fever, chills, weakness or fatigue.  HEENT: Eyes: No visual loss, blurred vision, double vision or yellow sclerae.No hearing loss, sneezing, congestion, runny nose or sore throat.  SKIN: No rash or itching.  CARDIOVASCULAR: per HPI RESPIRATORY: No cough or sputum.  GASTROINTESTINAL: No anorexia, nausea, vomiting or diarrhea. No abdominal pain or blood.  GENITOURINARY: No burning on urination, no polyuria NEUROLOGICAL: No headache, dizziness, syncope, paralysis, ataxia, numbness or tingling in the extremities. No change in bowel or bladder control.  MUSCULOSKELETAL: No muscle, back pain, joint pain or stiffness.  LYMPHATICS: No enlarged nodes. No history of splenectomy.  PSYCHIATRIC: No history of depression or anxiety.  ENDOCRINOLOGIC: No reports of sweating, cold or heat intolerance. No polyuria or polydipsia.  Marland Kitchen   Physical Examination p 68 bp 142/100 Wt 296 lbs BMI 46 Gen: resting comfortably, no acute distress HEENT: no scleral icterus, pupils equal round and reactive, no palptable cervical adenopathy,  CV: RRR, no m/r/g, no JVD, no carotid bruits Resp: Clear to auscultation bilaterally GI: abdomen is soft, non-tender, non-distended, normal bowel sounds, no hepatosplenomegaly MSK: extremities are warm, no edema.  Skin: warm, no rash Neuro:  no focal deficits Psych: appropriate affect   Diagnostic Studies  10/2012 Event monitor: no arrhythmias   08/2010 Echo:  Left ventricle: The cavity size was normal. Wall  thickness was  normal. Systolic function was normal. The estimated ejection  fraction was in the range of 60% to 65%. Wall motion was normal;  there were no regional wall motion abnormalities. The study is not  technically sufficient to allow evaluation of LV diastolic  function.  - Aortic valve: Mildly calcified annulus. Mild regurgitation.  - Mitral valve: Mild regurgitation.  - Tricuspid valve: Mild regurgitation.  - Pericardium, extracardiac: There was no pericardial effusion.   11/14/13 Clinic EKG  Sinus rhythm   10/2013 Echo  LVEF 60-65%, grade II diastolic dysfunction, mild  AI, mild MR   01/23/14 Clinic EKG  NSR, LAE, no ischemic changes   Assessment and Plan  1. Valvular heart disease  - mild AI and MR from prior echo 2011. - repeat echo shows stable disease, continue to follow clinically  2. HTN  - above goal given her hx of DM2. Will change lasix to more effective bp diuretic with chlorthalidone 25mg  daily. May take lasix prn.    3. Hyperlipidemia  - continue high dose statin. F/u pcp labs  4. Chronic diastolic heart failure  - notes some cramping with lasix use. Will try starting chlorthalidone with lasix prn only Request labs from pcp to f/u K and Mg.    F/u 6 months   Arnoldo Lenis, M.D

## 2015-03-14 NOTE — Patient Instructions (Signed)
Your physician wants you to follow-up in: [place] DR. BRANCH You will receive a reminder letter in the mail two months in advance. If you don't receive a letter, please call our office to schedule the follow-up appointment.  Your physician has recommended you make the following change in your medication:   START CHLORTHALIDONE 25 MG DAILY  CHANGE LASIX 40 MG DAILY AS NEEDED FOR SWELLING  CONTINUE ALL OTHER MEDICATIONS AS DIRECTED  WE WILL REQUEST LABS FROM DR. MCELROY  Thank you for choosing [facility]!!

## 2015-03-14 NOTE — Telephone Encounter (Signed)
LM for pt to call back. Sent HCTZ to [facility]. Also called pharmacy to confirm to cx chlorthalidone.

## 2015-03-14 NOTE — Telephone Encounter (Signed)
Pt called to say chlorthalidone that was ordered today was going to be $70 and pt could not afford. Medication is not on [facility] $4 list. Will forward to Dr. Harl Bowie for suggestions

## 2015-03-14 NOTE — Telephone Encounter (Signed)
Can change to HCTZ 25mg  daily.  JB

## 2015-03-17 NOTE — Telephone Encounter (Signed)
Pt made aware that HCTZ was sent to [facility].

## 2015-04-09 DIAGNOSIS — Z833 Family history of diabetes mellitus: Secondary | ICD-10-CM | POA: Diagnosis not present

## 2015-04-09 DIAGNOSIS — J449 Chronic obstructive pulmonary disease, unspecified: Secondary | ICD-10-CM | POA: Diagnosis not present

## 2015-04-09 DIAGNOSIS — Z9851 Tubal ligation status: Secondary | ICD-10-CM | POA: Diagnosis not present

## 2015-04-09 DIAGNOSIS — Z90710 Acquired absence of both cervix and uterus: Secondary | ICD-10-CM | POA: Diagnosis not present

## 2015-04-09 DIAGNOSIS — F418 Other specified anxiety disorders: Secondary | ICD-10-CM | POA: Diagnosis not present

## 2015-04-09 DIAGNOSIS — E119 Type 2 diabetes mellitus without complications: Secondary | ICD-10-CM | POA: Diagnosis not present

## 2015-04-09 DIAGNOSIS — R51 Headache: Secondary | ICD-10-CM | POA: Diagnosis not present

## 2015-04-09 DIAGNOSIS — Z8249 Family history of ischemic heart disease and other diseases of the circulatory system: Secondary | ICD-10-CM | POA: Diagnosis not present

## 2015-04-09 DIAGNOSIS — I10 Essential (primary) hypertension: Secondary | ICD-10-CM | POA: Diagnosis not present

## 2015-04-09 DIAGNOSIS — Z8673 Personal history of transient ischemic attack (TIA), and cerebral infarction without residual deficits: Secondary | ICD-10-CM | POA: Diagnosis not present

## 2015-04-21 ENCOUNTER — Other Ambulatory Visit: Payer: Self-pay | Admitting: Cardiology

## 2015-05-09 LAB — LIPID PANEL
Cholesterol: 171 mg/dL (ref 0–200)
HDL: 65 mg/dL (ref 35–70)
LDL CALC: 88 mg/dL
LDL/HDL RATIO: 2.6
Triglycerides: 91 mg/dL (ref 40–160)

## 2015-05-09 LAB — HEPATIC FUNCTION PANEL
ALT: 22 U/L (ref 7–35)
AST: 16 U/L (ref 13–35)
Alkaline Phosphatase: 120 U/L (ref 25–125)
Bilirubin, Total: 0.5 mg/dL

## 2015-05-09 LAB — BASIC METABOLIC PANEL
BUN: 7 mg/dL (ref 4–21)
Creatinine: 0.8 mg/dL (ref 0.5–1.1)
Glucose: 260 mg/dL
Potassium: 3.9 mmol/L (ref 3.4–5.3)
SODIUM: 142 mmol/L (ref 137–147)

## 2015-05-09 LAB — CBC AND DIFFERENTIAL
HEMATOCRIT: 39 % (ref 36–46)
Hemoglobin: 13.2 g/dL (ref 12.0–16.0)
PLATELETS: 247 10*3/uL (ref 150–399)
WBC: 5.3 10^3/mL

## 2015-05-09 LAB — HEMOGLOBIN A1C: Hgb A1c MFr Bld: 9.4 % — AB (ref 4.0–6.0)

## 2015-06-18 DIAGNOSIS — M940 Chondrocostal junction syndrome [Tietze]: Secondary | ICD-10-CM | POA: Diagnosis not present

## 2015-06-18 DIAGNOSIS — R0789 Other chest pain: Secondary | ICD-10-CM | POA: Diagnosis not present

## 2015-06-18 DIAGNOSIS — Z6841 Body Mass Index (BMI) 40.0 and over, adult: Secondary | ICD-10-CM | POA: Diagnosis not present

## 2015-06-18 DIAGNOSIS — E119 Type 2 diabetes mellitus without complications: Secondary | ICD-10-CM | POA: Diagnosis not present

## 2015-06-18 DIAGNOSIS — J449 Chronic obstructive pulmonary disease, unspecified: Secondary | ICD-10-CM | POA: Diagnosis not present

## 2015-06-18 DIAGNOSIS — G629 Polyneuropathy, unspecified: Secondary | ICD-10-CM | POA: Diagnosis not present

## 2015-06-18 DIAGNOSIS — F17200 Nicotine dependence, unspecified, uncomplicated: Secondary | ICD-10-CM | POA: Diagnosis not present

## 2015-06-18 DIAGNOSIS — F419 Anxiety disorder, unspecified: Secondary | ICD-10-CM | POA: Diagnosis not present

## 2015-06-18 DIAGNOSIS — I10 Essential (primary) hypertension: Secondary | ICD-10-CM | POA: Diagnosis not present

## 2015-07-08 DIAGNOSIS — J449 Chronic obstructive pulmonary disease, unspecified: Secondary | ICD-10-CM | POA: Diagnosis not present

## 2015-07-08 DIAGNOSIS — I10 Essential (primary) hypertension: Secondary | ICD-10-CM | POA: Diagnosis not present

## 2015-07-08 DIAGNOSIS — Z8249 Family history of ischemic heart disease and other diseases of the circulatory system: Secondary | ICD-10-CM | POA: Diagnosis not present

## 2015-07-08 DIAGNOSIS — J45909 Unspecified asthma, uncomplicated: Secondary | ICD-10-CM | POA: Diagnosis not present

## 2015-07-08 DIAGNOSIS — M940 Chondrocostal junction syndrome [Tietze]: Secondary | ICD-10-CM | POA: Diagnosis not present

## 2015-07-08 DIAGNOSIS — G8929 Other chronic pain: Secondary | ICD-10-CM | POA: Diagnosis not present

## 2015-07-08 DIAGNOSIS — Z833 Family history of diabetes mellitus: Secondary | ICD-10-CM | POA: Diagnosis not present

## 2015-07-08 DIAGNOSIS — F17200 Nicotine dependence, unspecified, uncomplicated: Secondary | ICD-10-CM | POA: Diagnosis not present

## 2015-07-08 DIAGNOSIS — R51 Headache: Secondary | ICD-10-CM | POA: Diagnosis not present

## 2015-07-08 DIAGNOSIS — Z794 Long term (current) use of insulin: Secondary | ICD-10-CM | POA: Diagnosis not present

## 2015-07-08 DIAGNOSIS — Z79899 Other long term (current) drug therapy: Secondary | ICD-10-CM | POA: Diagnosis not present

## 2015-07-08 DIAGNOSIS — E119 Type 2 diabetes mellitus without complications: Secondary | ICD-10-CM | POA: Diagnosis not present

## 2015-07-29 DIAGNOSIS — R072 Precordial pain: Secondary | ICD-10-CM | POA: Diagnosis not present

## 2015-07-29 DIAGNOSIS — E876 Hypokalemia: Secondary | ICD-10-CM | POA: Diagnosis not present

## 2015-07-30 DIAGNOSIS — I351 Nonrheumatic aortic (valve) insufficiency: Secondary | ICD-10-CM | POA: Diagnosis not present

## 2015-07-30 DIAGNOSIS — I517 Cardiomegaly: Secondary | ICD-10-CM | POA: Diagnosis not present

## 2015-08-22 ENCOUNTER — Ambulatory Visit (INDEPENDENT_AMBULATORY_CARE_PROVIDER_SITE_OTHER): Payer: Medicaid Other | Admitting: Neurology

## 2015-08-22 ENCOUNTER — Encounter: Payer: Self-pay | Admitting: Neurology

## 2015-08-22 VITALS — BP 200/98 | HR 84 | Temp 98.0°F | Resp 16 | Ht 66.5 in | Wt 294.5 lb

## 2015-08-22 DIAGNOSIS — F17200 Nicotine dependence, unspecified, uncomplicated: Secondary | ICD-10-CM

## 2015-08-22 DIAGNOSIS — G44221 Chronic tension-type headache, intractable: Secondary | ICD-10-CM

## 2015-08-22 DIAGNOSIS — Z720 Tobacco use: Secondary | ICD-10-CM | POA: Diagnosis not present

## 2015-08-22 MED ORDER — GABAPENTIN 300 MG PO CAPS: 300.0000 mg | ORAL_CAPSULE | Freq: Three times a day (TID) | ORAL | Status: DC

## 2015-08-22 NOTE — Patient Instructions (Addendum)
  1.  Start gabapentin 300mg  three times daily.  CALL IN 4 WEEKS WITH UPDATE 3.  Limit use of pain relievers to no more than 2 days out of the week.  These medications include acetaminophen, ibuprofen, triptans and narcotics.  This will help reduce risk of rebound headaches. 4.  Be aware of common food triggers such as processed sweets, processed foods with nitrites (such as deli meat, hot dogs, sausages), foods with MSG, alcohol (such as wine), chocolate, certain cheeses, certain fruits (dried fruits, some citrus fruit), vinegar, diet soda. 4.  Avoid caffeine 5.  Routine exercise 6.  Proper sleep hygiene 7.  Stay adequately hydrated with water 8.  Keep a headache diary. 9.  Maintain proper stress management. 10.  Do not skip meals.  Adequate diet.  Weight loss 11.  Consider supplements:  Magnesium oxide 400mg  to 600mg  daily, riboflavin 400mg , Coenzyme Q 10 100mg  three times daily 12.  Continue to try and quit smoking 13.  Follow up with eye doctor 14.  Follow up in 3 months.

## 2015-08-22 NOTE — Progress Notes (Signed)
NEUROLOGY CONSULTATION NOTE  Amber Harris MRN: [id number] DOB: 1960/12/04  Referring provider: Jacquelin Hawking, PA-C Primary care provider: Jacquelin Hawking, PA-C  Reason for consult:  headache  HISTORY OF PRESENT ILLNESS: Amber Harris is a 54 year old right-handed female with COPD, hypertension, hyperlipidemia, mitral regurgitation, aortic valve disease, morbid obesity, current smoker and sleep apnea who presents for headaches.  History obtained by patient and her niece.  Images of head CT from July 2015 was personally reviewed.  Labs reviewed.  Onset:  A year ago Location:  Back of head Quality:  throbbing Intensity:  8/10 Aura:  no Prodrome:  no Associated symptoms:  Some photophobia.  She has constant blurred vision (hasn't had a vision test in over a year but has upcoming appointment).  No nausea. Duration:  constant Frequency:  daily Triggers/exacerbating factors:  Light, sitting up straight Relieving factors:  none Activity:  Able to function  Past abortive medication:  none Past preventative medication:  None.  She was on Cymbalta 120mg  for depression Other past therapy:  none  Current abortive medication:  Ibuprofen, tylenol (takes both daily) Antihypertensive medications:  verapamil 160mg  twice daily, furosemide, Lisinopril 20mg  twice daily Antidepressant medications:  citalopram 20mg , mirtazapine 7.5mg  Anticonvulsant medications:  gabapentin 100mg  4 times daily Other medication:  Risperdal 0.5mg   CT of head performed on 06/11/14 for headache was unremarkable. B12 [id number]  Caffeine:  Stopped 3 months ago Alcohol:  no Smoker:  yes Diet:  Trying to improve Exercise:  no Depression/stress:  okay Family history of headache:  niece  PAST MEDICAL HISTORY: Past Medical History  Diagnosis Date  . Syncope     exertional  . Diabetes mellitus   . Hypertension   . Obesity   . Asthma     Uses p.r.n. albuterol  . Tobacco user     30 pack years; 04/2011: 1/4  pack per day during quick attempt  . Aortic valve disease     Long-standing systolic murmur  . Dyspnea on exertion     poor exercise tolerance  . Palpitations   . Depression with anxiety   . Fibroids     uterine; postmenopausal bleeding  . Degenerative joint disease     right knee  . COPD (chronic obstructive pulmonary disease)   . Headache(784.0)   . Depression   . Bronchitis   . Hyperlipidemia     PAST SURGICAL HISTORY: Past Surgical History  Procedure Laterality Date  . Tubal ligation    . Abdominal hysterectomy  12/21/2011    Procedure: HYSTERECTOMY ABDOMINAL;  Surgeon: Tilda Burrow, MD;  Location: [facility];  Service: Gynecology;  Laterality: N/A;    MEDICATIONS: Current Outpatient Prescriptions on File Prior to Visit  Medication Sig Dispense Refill  . albuterol (PROVENTIL) (5 MG/ML) 0.5% nebulizer solution Take 2.5 mg by nebulization 3 (three) times daily as needed for wheezing or shortness of breath.     Marland Kitchen albuterol (PROVENTIL) 90 MCG/ACT inhaler Inhale 2 puffs into the lungs 4 (four) times daily. Asthmatic Symptoms    . cloNIDine (CATAPRES) 0.2 MG tablet Take 0.2 mg by mouth 3 (three) times daily.     Marland Kitchen esomeprazole (NEXIUM) 40 MG capsule Take 40 mg by mouth daily before breakfast.    . estradiol (ESTRACE) 1 MG tablet Take 1 mg by mouth daily.    . furosemide (LASIX) 40 MG tablet Take 1 tab daily as needed for swelling 90 tablet 3  . hydrochlorothiazide (HYDRODIURIL) 25 MG tablet Take 1 tablet (  25 mg total) by mouth daily. 90 tablet 3  . Insulin Glargine (LANTUS) 100 UNIT/ML Solostar Pen Inject 14 Units into the skin at bedtime.     Marland Kitchen lisinopril (PRINIVIL,ZESTRIL) 20 MG tablet Take 20 mg by mouth 2 (two) times daily.    . risperiDONE (RISPERDAL) 0.5 MG tablet Take 0.5 mg by mouth daily at 12 noon.     . rosuvastatin (CRESTOR) 20 MG tablet Take 20 mg by mouth at bedtime.     . sitaGLIPtin-metformin (JANUMET) 50-1000 MG per tablet Take 1 tablet by mouth 2 (two) times  daily with a meal.    . verapamil (CALAN) 80 MG tablet Take 2 tablets (160 mg total) by mouth 2 (two) times daily. 60 tablet 6  . DULoxetine (CYMBALTA) 60 MG capsule Take 120 mg by mouth daily.      No current facility-administered medications on file prior to visit.    ALLERGIES: Allergies  Allergen Reactions  . Aspirin Hives  . Penicillins Rash    FAMILY HISTORY: Family History  Problem Relation Age of Onset  . Lung cancer Mother   . Heart disease Brother   . Breast cancer Sister   . Heart disease Maternal Aunt   . Anesthesia problems Neg Hx   . Hypotension Neg Hx   . Malignant hyperthermia Neg Hx   . Pseudochol deficiency Neg Hx   . Cancer Cousin     lung    SOCIAL HISTORY: Social History   Social History  . Marital Status: Single    Spouse Name: N/A  . Number of Children: 4  . Years of Education: N/A   Occupational History  . unemployed    Social History Main Topics  . Smoking status: Current Every Day Smoker -- 0.50 packs/day for 30 years    Types: Cigarettes    Start date: 10/02/1974  . Smokeless tobacco: Never Used     Comment: smoking 1 now (03/14/15) cigs daily since 04-2013  . Alcohol Use: No  . Drug Use: Yes    Special: Marijuana  . Sexual Activity: Not Currently    Birth Control/ Protection: Surgical   Other Topics Concern  . Not on file   Social History Narrative   Lives with friend (21 years) in a trailer   Patient has a GED.   Patient is disabled.   Patient is right handed.   Not Drinking caffeine currently.       REVIEW OF SYSTEMS: Constitutional: No fevers, chills, or sweats, no generalized fatigue, change in appetite Eyes: No visual changes, double vision, eye pain Ear, nose and throat: No hearing loss, ear pain, nasal congestion, sore throat Cardiovascular: No chest pain, palpitations Respiratory:  No shortness of breath at rest or with exertion, wheezes GastrointestinaI: No nausea, vomiting, diarrhea, abdominal pain, fecal  incontinence Genitourinary:  No dysuria, urinary retention or frequency Musculoskeletal:  No neck pain, back pain Integumentary: No rash, pruritus, skin lesions Neurological: as above Psychiatric: No depression, insomnia, anxiety Endocrine: No palpitations, fatigue, diaphoresis, mood swings, change in appetite, change in weight, increased thirst Hematologic/Lymphatic:  No anemia, purpura, petechiae. Allergic/Immunologic: no itchy/runny eyes, nasal congestion, recent allergic reactions, rashes  PHYSICAL EXAM: Filed Vitals:   08/22/15 1019  BP: 200/98  Pulse: 84  Temp: 98 F (36.7 C)  Resp: 16   General: No acute distress.   Head:  Normocephalic/atraumatic Eyes:  fundi unremarkable, without vessel changes, exudates, hemorrhages or papilledema. Neck: supple, no paraspinal tenderness, full range of motion Back: No paraspinal  tenderness Heart: regular rate and rhythm Lungs: Clear to auscultation bilaterally. Vascular: No carotid bruits. Neurological Exam: Mental status: alert and oriented to person, place, and time, recent and remote memory intact, fund of knowledge intact, attention and concentration intact, speech fluent and not dysarthric, language intact. Cranial nerves: CN I: not tested CN II: pupils equal, round and reactive to light, visual fields intact, fundi unremarkable, without vessel changes, exudates, hemorrhages or papilledema. CN III, IV, VI:  full range of motion, no nystagmus, no ptosis CN V: facial sensation intact CN VII: upper and lower face symmetric CN VIII: hearing intact CN IX, X: gag intact, uvula midline CN XI: sternocleidomastoid and trapezius muscles intact CN XII: tongue midline Bulk & Tone: normal, no fasciculations. Motor:  5/5 throughout Sensation:  Reduced pinprick over dorsum of right foot.  Vibration sensation reduced in feet. Deep Tendon Reflexes: trace throughout, toes downgoing. Finger to nose testing:  Without dysmetria.  Heel to shin:   Without dysmetria.  Gait:  Antalgic gait due to right knee pain.  Able to turn, unable tandem walk. Romberg negative.  IMPRESSION: Chronic headache, possibly tension-type and complicated by medication overuse Tobacco use Morbid obesity Hypertension  PLAN: 1.  Increase gabapentin to 300mg  three times daily 2.  Stop ibuprofen/Tylenol 3.  Eye exam 4.  Smoking cessation discussed 5.  Weight loss 6.  Follow up in 3 months but call in 4 weeks with update. 7.  BP elevated.  Should have rechecked with PCP.  Thank you for allowing me to take part in the care of this patient.  Shon Millet, DO  CC:  Amber Hawking, PA-C

## 2015-09-02 DIAGNOSIS — E669 Obesity, unspecified: Secondary | ICD-10-CM | POA: Diagnosis not present

## 2015-09-02 DIAGNOSIS — M1712 Unilateral primary osteoarthritis, left knee: Secondary | ICD-10-CM | POA: Diagnosis not present

## 2015-09-02 DIAGNOSIS — J449 Chronic obstructive pulmonary disease, unspecified: Secondary | ICD-10-CM | POA: Diagnosis not present

## 2015-09-09 ENCOUNTER — Ambulatory Visit: Payer: Self-pay | Admitting: Physician Assistant

## 2015-09-11 ENCOUNTER — Encounter: Payer: Self-pay | Admitting: *Deleted

## 2015-09-11 ENCOUNTER — Encounter: Payer: Self-pay | Admitting: Cardiology

## 2015-09-11 ENCOUNTER — Ambulatory Visit (INDEPENDENT_AMBULATORY_CARE_PROVIDER_SITE_OTHER): Payer: Medicare Other | Admitting: Cardiology

## 2015-09-11 VITALS — BP 166/85 | HR 81 | Ht 67.0 in | Wt 290.0 lb

## 2015-09-11 DIAGNOSIS — I359 Nonrheumatic aortic valve disorder, unspecified: Secondary | ICD-10-CM

## 2015-09-11 DIAGNOSIS — I340 Nonrheumatic mitral (valve) insufficiency: Secondary | ICD-10-CM | POA: Diagnosis not present

## 2015-09-11 DIAGNOSIS — I5032 Chronic diastolic (congestive) heart failure: Secondary | ICD-10-CM

## 2015-09-11 MED ORDER — FUROSEMIDE 40 MG PO TABS: ORAL_TABLET | ORAL | Status: DC

## 2015-09-11 MED ORDER — HYDROCHLOROTHIAZIDE 25 MG PO TABS: 25.0000 mg | ORAL_TABLET | Freq: Every day | ORAL | Status: DC

## 2015-09-11 NOTE — Patient Instructions (Signed)
Your physician wants you to follow-up in: [place] DR. BRANCH You will receive a reminder letter in the mail two months in advance. If you don't receive a letter, please call our office to schedule the follow-up appointment.  Your physician has recommended you make the following change in your medication:   INCREASE LASIX 40 MG DAILY AS NEEDED FOR SWELLING  START HYDROCHLOROTHIAZIDE (HCTZ) 25 MG DAILY  Thank you for choosing [facility]!!

## 2015-09-11 NOTE — Progress Notes (Signed)
Patient ID: Amber Harris, female   DOB: [birth year], 54 y.o.   MRN: [id number]     Clinical Summary Amber Harris is a 54 y.o.female seen today for follow up of the following medical problems.   1. Valvular heart disease  - mild AI and MR noted on prior echo 12/2009 and 10/2013 - describes stable SOB.No orthopnea or PND   2. HTN  - does not check at home  - compliant with meds   3. Hyperlipidemia  - compliant with crestor  - 04/2015: TC 171 TG 91 HDL 65 LDL 88  4. Chronic diastolic HF - recent echo shows grade II diastolic dysfunction - denies any LE edema. Weight down 6 lbs since 02/2015    Past Medical History  Diagnosis Date  . Syncope     exertional  . Diabetes mellitus   . Hypertension   . Obesity   . Asthma     Uses p.r.n. albuterol  . Tobacco user     30 pack years; 04/2011: 1/4 pack per day during quick attempt  . Aortic valve disease     Long-standing systolic murmur  . Dyspnea on exertion     poor exercise tolerance  . Palpitations   . Depression with anxiety   . Fibroids     uterine; postmenopausal bleeding  . Degenerative joint disease     right knee  . COPD (chronic obstructive pulmonary disease)   . Headache(784.0)   . Depression   . Bronchitis   . Hyperlipidemia      Allergies  Allergen Reactions  . Aspirin Hives  . Penicillins Rash     Current Outpatient Prescriptions  Medication Sig Dispense Refill  . albuterol (PROVENTIL) (5 MG/ML) 0.5% nebulizer solution Take 2.5 mg by nebulization 3 (three) times daily as needed for wheezing or shortness of breath.     Marland Kitchen albuterol (PROVENTIL) 90 MCG/ACT inhaler Inhale 2 puffs into the lungs 4 (four) times daily. Asthmatic Symptoms    . citalopram (CELEXA) 20 MG tablet Take 20 mg by mouth daily.    . cloNIDine (CATAPRES) 0.2 MG tablet Take 0.2 mg by mouth 3 (three) times daily.     . DULoxetine (CYMBALTA) 60 MG capsule Take 120 mg by mouth daily.     Marland Kitchen esomeprazole (NEXIUM) 40 MG capsule Take 40  mg by mouth daily before breakfast.    . estradiol (ESTRACE) 1 MG tablet Take 1 mg by mouth daily.    . furosemide (LASIX) 40 MG tablet Take 1 tab daily as needed for swelling 90 tablet 3  . gabapentin (NEURONTIN) 300 MG capsule Take 1 capsule (300 mg total) by mouth 3 (three) times daily. 90 capsule 5  . hydrochlorothiazide (HYDRODIURIL) 25 MG tablet Take 1 tablet (25 mg total) by mouth daily. 90 tablet 3  . Insulin Glargine (LANTUS) 100 UNIT/ML Solostar Pen Inject 14 Units into the skin at bedtime.     Marland Kitchen lisinopril (PRINIVIL,ZESTRIL) 20 MG tablet Take 20 mg by mouth 2 (two) times daily.    . methocarbamol (ROBAXIN) 750 MG tablet Take 750 mg by mouth 3 (three) times daily.    . mirtazapine (REMERON) 7.5 MG tablet Take 7.5 mg by mouth at bedtime.    . risperiDONE (RISPERDAL) 0.5 MG tablet Take 0.5 mg by mouth daily at 12 noon.     . rosuvastatin (CRESTOR) 20 MG tablet Take 20 mg by mouth at bedtime.     . sitaGLIPtin-metformin (JANUMET) 50-1000 MG per tablet Take 1  tablet by mouth 2 (two) times daily with a meal.    . verapamil (CALAN) 80 MG tablet Take 2 tablets (160 mg total) by mouth 2 (two) times daily. 60 tablet 6   No current facility-administered medications for this visit.     Past Surgical History  Procedure Laterality Date  . Tubal ligation    . Abdominal hysterectomy  12/21/2011    Procedure: HYSTERECTOMY ABDOMINAL;  Surgeon: Tilda Burrow, MD;  Location: [facility];  Service: Gynecology;  Laterality: N/A;     Allergies  Allergen Reactions  . Aspirin Hives  . Penicillins Rash      Family History  Problem Relation Age of Onset  . Lung cancer Mother   . Heart disease Brother   . Breast cancer Sister   . Heart disease Maternal Aunt   . Anesthesia problems Neg Hx   . Hypotension Neg Hx   . Malignant hyperthermia Neg Hx   . Pseudochol deficiency Neg Hx   . Cancer Cousin     lung     Social History Amber Harris reports that she has been smoking Cigarettes.  She  started smoking about 40 years ago. She has a 15 pack-year smoking history. She has never used smokeless tobacco. Amber Harris reports that she does not drink alcohol.   Review of Systems CONSTITUTIONAL: No weight loss, fever, chills, weakness or fatigue.  HEENT: Eyes: No visual loss, blurred vision, double vision or yellow sclerae.No hearing loss, sneezing, congestion, runny nose or sore throat.  SKIN: No rash or itching.  CARDIOVASCULAR: per hpi RESPIRATORY: No shortness of breath, cough or sputum.  GASTROINTESTINAL: No anorexia, nausea, vomiting or diarrhea. No abdominal pain or blood.  GENITOURINARY: No burning on urination, no polyuria NEUROLOGICAL: No headache, dizziness, syncope, paralysis, ataxia, numbness or tingling in the extremities. No change in bowel or bladder control.  MUSCULOSKELETAL: No muscle, back pain, joint pain or stiffness.  LYMPHATICS: No enlarged nodes. No history of splenectomy.  PSYCHIATRIC: No history of depression or anxiety.  ENDOCRINOLOGIC: No reports of sweating, cold or heat intolerance. No polyuria or polydipsia.  Marland Kitchen   Physical Examination Filed Vitals:   09/11/15 1042  BP: 166/85  Pulse: 81   Filed Vitals:   09/11/15 1042  Height: 5\' 7"  (1.702 m)  Weight: 290 lb (131.543 kg)    Gen: resting comfortably, no acute distress HEENT: no scleral icterus, pupils equal round and reactive, no palptable cervical adenopathy,  CV:RRR, 2/6 systolic murmur rusb, no jvd Resp: Clear to auscultation bilaterally GI: abdomen is soft, non-tender, non-distended, normal bowel sounds, no hepatosplenomegaly MSK: extremities are warm, no edema.  Skin: warm, no rash Neuro:  no focal deficits Psych: appropriate affect   Diagnostic Studies 10/2012 Event monitor: no arrhythmias   08/2010 Echo:  Left ventricle: The cavity size was normal. Wall thickness was  normal. Systolic function was normal. The estimated ejection  fraction was in the range of 60% to 65%.  Wall motion was normal;  there were no regional wall motion abnormalities. The study is not  technically sufficient to allow evaluation of LV diastolic  function.  - Aortic valve: Mildly calcified annulus. Mild regurgitation.  - Mitral valve: Mild regurgitation.  - Tricuspid valve: Mild regurgitation.  - Pericardium, extracardiac: There was no pericardial effusion.   11/14/13 Clinic EKG  Sinus rhythm   10/2013 Echo  LVEF 60-65%, grade II diastolic dysfunction, mild AI, mild MR   01/23/14 Clinic EKG  NSR, LAE, no ischemic changes  Assessment and Plan   1. Valvular heart disease  - mild AI and MR from prior echo 2011. - 10/2013 echo shows stable disease, continue to follow clinically  2. HTN  - above goal given her hx of DM2. - chlorthalidone was too expensive, will try HCTZ 25mg  daily. .    3. Hyperlipidemia  - continue high dose statin.  4. Chronic diastolic heart failure  - appears euvolemic, continue current meds  F/u 6 months     Antoine Poche, M.D.

## 2015-10-27 DIAGNOSIS — E6609 Other obesity due to excess calories: Secondary | ICD-10-CM | POA: Diagnosis not present

## 2015-10-27 DIAGNOSIS — F3175 Bipolar disorder, in partial remission, most recent episode depressed: Secondary | ICD-10-CM | POA: Diagnosis not present

## 2015-10-27 DIAGNOSIS — J018 Other acute sinusitis: Secondary | ICD-10-CM | POA: Diagnosis not present

## 2015-10-27 DIAGNOSIS — F338 Other recurrent depressive disorders: Secondary | ICD-10-CM | POA: Diagnosis not present

## 2015-10-27 DIAGNOSIS — E1142 Type 2 diabetes mellitus with diabetic polyneuropathy: Secondary | ICD-10-CM | POA: Diagnosis not present

## 2015-10-27 DIAGNOSIS — I10 Essential (primary) hypertension: Secondary | ICD-10-CM | POA: Diagnosis not present

## 2015-10-27 DIAGNOSIS — E784 Other hyperlipidemia: Secondary | ICD-10-CM | POA: Diagnosis not present

## 2015-10-30 DIAGNOSIS — F17200 Nicotine dependence, unspecified, uncomplicated: Secondary | ICD-10-CM | POA: Diagnosis not present

## 2015-10-30 DIAGNOSIS — R0602 Shortness of breath: Secondary | ICD-10-CM | POA: Diagnosis not present

## 2015-10-30 DIAGNOSIS — I10 Essential (primary) hypertension: Secondary | ICD-10-CM | POA: Diagnosis not present

## 2015-10-30 DIAGNOSIS — E119 Type 2 diabetes mellitus without complications: Secondary | ICD-10-CM | POA: Diagnosis not present

## 2015-10-30 DIAGNOSIS — Z79899 Other long term (current) drug therapy: Secondary | ICD-10-CM | POA: Diagnosis not present

## 2015-10-30 DIAGNOSIS — R0789 Other chest pain: Secondary | ICD-10-CM | POA: Diagnosis not present

## 2015-10-30 DIAGNOSIS — J45909 Unspecified asthma, uncomplicated: Secondary | ICD-10-CM | POA: Diagnosis not present

## 2015-10-30 DIAGNOSIS — Z794 Long term (current) use of insulin: Secondary | ICD-10-CM | POA: Diagnosis not present

## 2015-10-30 DIAGNOSIS — M542 Cervicalgia: Secondary | ICD-10-CM | POA: Diagnosis not present

## 2015-10-30 DIAGNOSIS — J449 Chronic obstructive pulmonary disease, unspecified: Secondary | ICD-10-CM | POA: Diagnosis not present

## 2015-11-03 ENCOUNTER — Ambulatory Visit: Payer: Medicaid Other | Admitting: Neurology

## 2015-11-17 DIAGNOSIS — F329 Major depressive disorder, single episode, unspecified: Secondary | ICD-10-CM | POA: Diagnosis not present

## 2015-11-25 ENCOUNTER — Other Ambulatory Visit: Payer: Self-pay | Admitting: Physician Assistant

## 2015-11-27 DIAGNOSIS — Z1231 Encounter for screening mammogram for malignant neoplasm of breast: Secondary | ICD-10-CM | POA: Diagnosis not present

## 2015-12-10 DIAGNOSIS — E1121 Type 2 diabetes mellitus with diabetic nephropathy: Secondary | ICD-10-CM | POA: Diagnosis not present

## 2015-12-10 DIAGNOSIS — H524 Presbyopia: Secondary | ICD-10-CM | POA: Diagnosis not present

## 2015-12-10 DIAGNOSIS — H52223 Regular astigmatism, bilateral: Secondary | ICD-10-CM | POA: Diagnosis not present

## 2015-12-10 DIAGNOSIS — H5202 Hypermetropia, left eye: Secondary | ICD-10-CM | POA: Diagnosis not present

## 2016-01-28 DIAGNOSIS — I10 Essential (primary) hypertension: Secondary | ICD-10-CM | POA: Diagnosis not present

## 2016-01-28 DIAGNOSIS — X12XXXA Contact with other hot fluids, initial encounter: Secondary | ICD-10-CM | POA: Diagnosis not present

## 2016-01-28 DIAGNOSIS — F329 Major depressive disorder, single episode, unspecified: Secondary | ICD-10-CM | POA: Diagnosis not present

## 2016-01-28 DIAGNOSIS — J449 Chronic obstructive pulmonary disease, unspecified: Secondary | ICD-10-CM | POA: Diagnosis not present

## 2016-01-28 DIAGNOSIS — T22212A Burn of second degree of left forearm, initial encounter: Secondary | ICD-10-CM | POA: Diagnosis not present

## 2016-01-28 DIAGNOSIS — Z794 Long term (current) use of insulin: Secondary | ICD-10-CM | POA: Diagnosis not present

## 2016-01-28 DIAGNOSIS — E119 Type 2 diabetes mellitus without complications: Secondary | ICD-10-CM | POA: Diagnosis not present

## 2016-01-28 DIAGNOSIS — Z79899 Other long term (current) drug therapy: Secondary | ICD-10-CM | POA: Diagnosis not present

## 2016-01-28 DIAGNOSIS — J45909 Unspecified asthma, uncomplicated: Secondary | ICD-10-CM | POA: Diagnosis not present

## 2016-03-18 DIAGNOSIS — F329 Major depressive disorder, single episode, unspecified: Secondary | ICD-10-CM | POA: Diagnosis not present

## 2016-03-23 DIAGNOSIS — H1089 Other conjunctivitis: Secondary | ICD-10-CM | POA: Diagnosis not present

## 2016-03-23 DIAGNOSIS — E6609 Other obesity due to excess calories: Secondary | ICD-10-CM | POA: Diagnosis not present

## 2016-03-23 DIAGNOSIS — H10231 Serous conjunctivitis, except viral, right eye: Secondary | ICD-10-CM | POA: Diagnosis not present

## 2016-03-23 DIAGNOSIS — I10 Essential (primary) hypertension: Secondary | ICD-10-CM | POA: Diagnosis not present

## 2016-03-23 DIAGNOSIS — Z0000 Encounter for general adult medical examination without abnormal findings: Secondary | ICD-10-CM | POA: Diagnosis not present

## 2016-03-23 DIAGNOSIS — F3175 Bipolar disorder, in partial remission, most recent episode depressed: Secondary | ICD-10-CM | POA: Diagnosis not present

## 2016-03-23 DIAGNOSIS — F338 Other recurrent depressive disorders: Secondary | ICD-10-CM | POA: Diagnosis not present

## 2016-03-23 DIAGNOSIS — E1142 Type 2 diabetes mellitus with diabetic polyneuropathy: Secondary | ICD-10-CM | POA: Diagnosis not present

## 2016-03-23 DIAGNOSIS — E784 Other hyperlipidemia: Secondary | ICD-10-CM | POA: Diagnosis not present

## 2016-03-24 ENCOUNTER — Encounter: Payer: Self-pay | Admitting: *Deleted

## 2016-03-26 ENCOUNTER — Ambulatory Visit: Payer: Medicare Other | Admitting: Cardiology

## 2016-03-26 NOTE — Progress Notes (Unsigned)
Patient ID: Amber Harris, female   DOB: 04/30/1961, 55 y.o.   MRN: [id number]     Clinical Summary Amber Harris is a 55 y.o.female seen today for follow up of the following medical problems.   1. Valvular heart disease  - mild AI and MR noted on prior echo 12/2009 and 10/2013 - describes stable SOB.No orthopnea or PND   2. HTN  - does not check at home  - compliant with meds   3. Hyperlipidemia  - compliant with crestor  - 04/2015: TC 171 TG 91 HDL 65 LDL 88  4. Chronic diastolic HF - recent echo shows grade II diastolic dysfunction - denies any LE edema. Weight down 6 lbs since 02/2015  5.  Past Medical History  Diagnosis Date  . Syncope     exertional  . Diabetes mellitus   . Hypertension   . Obesity   . Asthma     Uses p.r.n. albuterol  . Tobacco user     30 pack years; 04/2011: 1/4 pack per day during quick attempt  . Aortic valve disease     Long-standing systolic murmur  . Dyspnea on exertion     poor exercise tolerance  . Palpitations   . Depression with anxiety   . Fibroids     uterine; postmenopausal bleeding  . Degenerative joint disease     right knee  . COPD (chronic obstructive pulmonary disease) (H[facility])   . Headache(784.0)   . Depression   . Bronchitis   . Hyperlipidemia      Allergies  Allergen Reactions  . Aspirin Hives  . Penicillins Rash     Current Outpatient Prescriptions  Medication Sig Dispense Refill  . albuterol (PROVENTIL) (5 MG/ML) 0.5% nebulizer solution Take 2.5 mg by nebulization 3 (three) times daily as needed for wheezing or shortness of breath.     Marland Kitchen albuterol (PROVENTIL) 90 MCG/ACT inhaler Inhale 2 puffs into the lungs 4 (four) times daily. Asthmatic Symptoms    . citalopram (CELEXA) 20 MG tablet Take 20 mg by mouth daily.    . cloNIDine (CATAPRES) 0.3 MG tablet Take 1 tablet by mouth 3 (three) times daily.  3  . estradiol (ESTRACE) 1 MG tablet Take 1 mg by mouth daily.    . furosemide (LASIX) 40 MG tablet Take 1  tab daily as needed for swelling 90 tablet 3  . gabapentin (NEURONTIN) 300 MG capsule Take 1 capsule (300 mg total) by mouth 3 (three) times daily. 90 capsule 5  . hydrochlorothiazide (HYDRODIURIL) 25 MG tablet Take 1 tablet (25 mg total) by mouth daily. 90 tablet 3  . Insulin Glargine (LANTUS) 100 UNIT/ML Solostar Pen Inject 14 Units into the skin at bedtime.     Marland Kitchen lisinopril (PRINIVIL,ZESTRIL) 20 MG tablet Take 20 mg by mouth 2 (two) times daily.    . methocarbamol (ROBAXIN) 750 MG tablet Take 750 mg by mouth 3 (three) times daily.    . mirtazapine (REMERON) 15 MG tablet Take 15 mg by mouth at bedtime.    . mometasone-formoterol (DULERA) 100-5 MCG/ACT AERO Inhale 2 puffs into the lungs 2 (two) times daily.    Marland Kitchen omeprazole (PRILOSEC) 20 MG capsule Take 2 capsules by mouth daily.  1  . risperiDONE (RISPERDAL) 0.5 MG tablet Take 0.5 mg by mouth daily at 12 noon.     . rosuvastatin (CRESTOR) 20 MG tablet Take 20 mg by mouth at bedtime.     . sitaGLIPtin-metformin (JANUMET) 50-1000 MG per tablet Take  1 tablet by mouth 2 (two) times daily with a meal.    . verapamil (CALAN) 80 MG tablet Take 2 tablets (160 mg total) by mouth 2 (two) times daily. 60 tablet 6   No current facility-administered medications for this visit.     Past Surgical History  Procedure Laterality Date  . Tubal ligation    . Abdominal hysterectomy  12/21/2011    Procedure: HYSTERECTOMY ABDOMINAL;  Surgeon: Tilda Burrow, MD;  Location: [facility];  Service: Gynecology;  Laterality: N/A;     Allergies  Allergen Reactions  . Aspirin Hives  . Penicillins Rash      Family History  Problem Relation Age of Onset  . Lung cancer Mother   . Heart disease Brother   . Breast cancer Sister   . Heart disease Maternal Aunt   . Anesthesia problems Neg Hx   . Hypotension Neg Hx   . Malignant hyperthermia Neg Hx   . Pseudochol deficiency Neg Hx   . Cancer Cousin     lung     Social History Amber Harris reports that she has  been smoking Cigarettes.  She started smoking about 41 years ago. She has a 15 pack-year smoking history. She has never used smokeless tobacco. Amber Harris reports that she does not drink alcohol.   Review of Systems CONSTITUTIONAL: No weight loss, fever, chills, weakness or fatigue.  HEENT: Eyes: No visual loss, blurred vision, double vision or yellow sclerae.No hearing loss, sneezing, congestion, runny nose or sore throat.  SKIN: No rash or itching.  CARDIOVASCULAR:  RESPIRATORY: No shortness of breath, cough or sputum.  GASTROINTESTINAL: No anorexia, nausea, vomiting or diarrhea. No abdominal pain or blood.  GENITOURINARY: No burning on urination, no polyuria NEUROLOGICAL: No headache, dizziness, syncope, paralysis, ataxia, numbness or tingling in the extremities. No change in bowel or bladder control.  MUSCULOSKELETAL: No muscle, back pain, joint pain or stiffness.  LYMPHATICS: No enlarged nodes. No history of splenectomy.  PSYCHIATRIC: No history of depression or anxiety.  ENDOCRINOLOGIC: No reports of sweating, cold or heat intolerance. No polyuria or polydipsia.  Marland Kitchen   Physical Examination There were no vitals filed for this visit. There were no vitals filed for this visit.  Gen: resting comfortably, no acute distress HEENT: no scleral icterus, pupils equal round and reactive, no palptable cervical adenopathy,  CV Resp: Clear to auscultation bilaterally GI: abdomen is soft, non-tender, non-distended, normal bowel sounds, no hepatosplenomegaly MSK: extremities are warm, no edema.  Skin: warm, no rash Neuro:  no focal deficits Psych: appropriate affect   Diagnostic Studies 10/2012 Event monitor: no arrhythmias   08/2010 Echo:  Left ventricle: The cavity size was normal. Wall thickness was  normal. Systolic function was normal. The estimated ejection  fraction was in the range of 60% to 65%. Wall motion was normal;  there were no regional wall motion abnormalities. The  study is not  technically sufficient to allow evaluation of LV diastolic  function.  - Aortic valve: Mildly calcified annulus. Mild regurgitation.  - Mitral valve: Mild regurgitation.  - Tricuspid valve: Mild regurgitation.  - Pericardium, extracardiac: There was no pericardial effusion.   11/14/13 Clinic EKG  Sinus rhythm   10/2013 Echo  LVEF 60-65%, grade II diastolic dysfunction, mild AI, mild MR   01/23/14 Clinic EKG  NSR, LAE, no ischemic changes    Assessment and Plan  1. Valvular heart disease  - mild AI and MR from prior echo 2011. - 10/2013 echo shows stable  disease, continue to follow clinically  2. HTN  - above goal given her hx of DM2. - chlorthalidone was too expensive, will try HCTZ 25mg  daily. .    3. Hyperlipidemia  - continue high dose statin.  4. Chronic diastolic heart failure  - appears euvolemic, continue current meds      Antoine Poche, M.D., F.A.C.C.

## 2016-05-14 DIAGNOSIS — Z823 Family history of stroke: Secondary | ICD-10-CM | POA: Diagnosis not present

## 2016-05-14 DIAGNOSIS — Z8249 Family history of ischemic heart disease and other diseases of the circulatory system: Secondary | ICD-10-CM | POA: Diagnosis not present

## 2016-05-14 DIAGNOSIS — J449 Chronic obstructive pulmonary disease, unspecified: Secondary | ICD-10-CM | POA: Diagnosis not present

## 2016-05-14 DIAGNOSIS — F17210 Nicotine dependence, cigarettes, uncomplicated: Secondary | ICD-10-CM | POA: Diagnosis not present

## 2016-05-14 DIAGNOSIS — Z7984 Long term (current) use of oral hypoglycemic drugs: Secondary | ICD-10-CM | POA: Diagnosis not present

## 2016-05-14 DIAGNOSIS — Z90710 Acquired absence of both cervix and uterus: Secondary | ICD-10-CM | POA: Diagnosis not present

## 2016-05-14 DIAGNOSIS — J45909 Unspecified asthma, uncomplicated: Secondary | ICD-10-CM | POA: Diagnosis not present

## 2016-05-14 DIAGNOSIS — Z880 Allergy status to penicillin: Secondary | ICD-10-CM | POA: Diagnosis not present

## 2016-05-14 DIAGNOSIS — Z886 Allergy status to analgesic agent status: Secondary | ICD-10-CM | POA: Diagnosis not present

## 2016-05-14 DIAGNOSIS — Z794 Long term (current) use of insulin: Secondary | ICD-10-CM | POA: Diagnosis not present

## 2016-05-14 DIAGNOSIS — Z1211 Encounter for screening for malignant neoplasm of colon: Secondary | ICD-10-CM | POA: Diagnosis not present

## 2016-05-14 DIAGNOSIS — Z79899 Other long term (current) drug therapy: Secondary | ICD-10-CM | POA: Diagnosis not present

## 2016-05-14 DIAGNOSIS — I252 Old myocardial infarction: Secondary | ICD-10-CM | POA: Diagnosis not present

## 2016-05-14 DIAGNOSIS — Z801 Family history of malignant neoplasm of trachea, bronchus and lung: Secondary | ICD-10-CM | POA: Diagnosis not present

## 2016-05-14 DIAGNOSIS — I10 Essential (primary) hypertension: Secondary | ICD-10-CM | POA: Diagnosis not present

## 2016-05-14 DIAGNOSIS — E119 Type 2 diabetes mellitus without complications: Secondary | ICD-10-CM | POA: Diagnosis not present

## 2016-06-10 ENCOUNTER — Encounter: Payer: Self-pay | Admitting: Cardiology

## 2016-06-10 ENCOUNTER — Ambulatory Visit (INDEPENDENT_AMBULATORY_CARE_PROVIDER_SITE_OTHER): Payer: Medicare Other | Admitting: Cardiology

## 2016-06-10 VITALS — BP 144/84 | HR 58 | Ht 67.0 in | Wt 261.4 lb

## 2016-06-10 DIAGNOSIS — I340 Nonrheumatic mitral (valve) insufficiency: Secondary | ICD-10-CM | POA: Diagnosis not present

## 2016-06-10 DIAGNOSIS — I5032 Chronic diastolic (congestive) heart failure: Secondary | ICD-10-CM | POA: Diagnosis not present

## 2016-06-10 DIAGNOSIS — I359 Nonrheumatic aortic valve disorder, unspecified: Secondary | ICD-10-CM

## 2016-06-10 DIAGNOSIS — E785 Hyperlipidemia, unspecified: Secondary | ICD-10-CM

## 2016-06-10 DIAGNOSIS — I10 Essential (primary) hypertension: Secondary | ICD-10-CM | POA: Diagnosis not present

## 2016-06-10 MED ORDER — CLONIDINE HCL 0.3 MG PO TABS: ORAL_TABLET | ORAL | Status: DC

## 2016-06-10 MED ORDER — HYDRALAZINE HCL 50 MG PO TABS: 50.0000 mg | ORAL_TABLET | Freq: Three times a day (TID) | ORAL | Status: DC

## 2016-06-10 NOTE — Patient Instructions (Signed)
Your physician recommends that you schedule a follow-up appointment in: [place]  Your physician wants you to follow-up in: [place] DR. BRANCH You will receive a reminder letter in the mail two months in advance. If you don't receive a letter, please call our office to schedule the follow-up appointment.  Your physician has recommended you make the following change in your medication:   DECREASE CLONIDINE 1 TAB TWICE DAILY FOR 3 DAYS THEN 1/2 TAB TWICE DAILY FOR 3 DAYS THEN STOP.  START HYDRALAZINE 50 MG THREE TIMES DAILY  Thank you for choosing [facility]!!

## 2016-06-10 NOTE — Progress Notes (Signed)
Clinical Summary Amber Harris is a 55 y.o.female seen today for follow up of the following medical problems.   1. Valvular heart disease  - mild AI and MR noted on prior echo 12/2009 and 10/2013  - stable SOB, improved with nebulizer. Can have some occasional LE edema but overall mild  2. HTN  - does not check at home  - compliant with meds. Severe fatigue after taking meds.   3. Hyperlipidemia  - compliant with crestor  - 04/2015: TC 171 TG 91 HDL 65 LDL 88 - upcoming labs with pcp  4. Chronic diastolic HF - recent echo shows grade II diastolic dysfunction - denies any LE edema, SOB, or DOE Past Medical History  Diagnosis Date  . Syncope     exertional  . Diabetes mellitus   . Hypertension   . Obesity   . Asthma     Uses p.r.n. albuterol  . Tobacco user     30 pack years; 04/2011: 1/4 pack per day during quick attempt  . Aortic valve disease     Long-standing systolic murmur  . Dyspnea on exertion     poor exercise tolerance  . Palpitations   . Depression with anxiety   . Fibroids     uterine; postmenopausal bleeding  . Degenerative joint disease     right knee  . COPD (chronic obstructive pulmonary disease) ([place])   . Headache(784.0)   . Depression   . Bronchitis   . Hyperlipidemia      Allergies  Allergen Reactions  . Aspirin Hives  . Penicillins Rash     Current Outpatient Prescriptions  Medication Sig Dispense Refill  . albuterol (PROVENTIL) (5 MG/ML) 0.5% nebulizer solution Take 2.5 mg by nebulization 3 (three) times daily as needed for wheezing or shortness of breath.     Marland Kitchen albuterol (PROVENTIL) 90 MCG/ACT inhaler Inhale 2 puffs into the lungs 4 (four) times daily. Asthmatic Symptoms    . citalopram (CELEXA) 20 MG tablet Take 20 mg by mouth daily.    . cloNIDine (CATAPRES) 0.3 MG tablet Take 1 tablet by mouth 3 (three) times daily.  3  . estradiol (ESTRACE) 1 MG tablet Take 1 mg by mouth daily.    . furosemide (LASIX) 40 MG tablet Take 1  tab daily as needed for swelling 90 tablet 3  . gabapentin (NEURONTIN) 300 MG capsule Take 1 capsule (300 mg total) by mouth 3 (three) times daily. 90 capsule 5  . hydrochlorothiazide (HYDRODIURIL) 25 MG tablet Take 1 tablet (25 mg total) by mouth daily. 90 tablet 3  . Insulin Glargine (LANTUS) 100 UNIT/ML Solostar Pen Inject 14 Units into the skin at bedtime.     Marland Kitchen lisinopril (PRINIVIL,ZESTRIL) 20 MG tablet Take 20 mg by mouth 2 (two) times daily.    . methocarbamol (ROBAXIN) 750 MG tablet Take 750 mg by mouth 3 (three) times daily.    . mirtazapine (REMERON) 15 MG tablet Take 15 mg by mouth at bedtime.    . mometasone-formoterol (DULERA) 100-5 MCG/ACT AERO Inhale 2 puffs into the lungs 2 (two) times daily.    Marland Kitchen omeprazole (PRILOSEC) 20 MG capsule Take 2 capsules by mouth daily.  1  . risperiDONE (RISPERDAL) 0.5 MG tablet Take 0.5 mg by mouth daily at 12 noon.     . rosuvastatin (CRESTOR) 20 MG tablet Take 20 mg by mouth at bedtime.     . sitaGLIPtin-metformin (JANUMET) 50-1000 MG per tablet Take 1 tablet by mouth 2 (  two) times daily with a meal.    . verapamil (CALAN) 80 MG tablet Take 2 tablets (160 mg total) by mouth 2 (two) times daily. 60 tablet 6   No current facility-administered medications for this visit.     Past Surgical History  Procedure Laterality Date  . Tubal ligation    . Abdominal hysterectomy  12/21/2011    Procedure: HYSTERECTOMY ABDOMINAL;  Surgeon: Jonnie Kind, MD;  Location: [facility];  Service: Gynecology;  Laterality: N/A;     Allergies  Allergen Reactions  . Aspirin Hives  . Penicillins Rash      Family History  Problem Relation Age of Onset  . Lung cancer Mother   . Heart disease Brother   . Breast cancer Sister   . Heart disease Maternal Aunt   . Anesthesia problems Neg Hx   . Hypotension Neg Hx   . Malignant hyperthermia Neg Hx   . Pseudochol deficiency Neg Hx   . Cancer Cousin     lung     Social History Amber Harris reports that she has  been smoking Cigarettes.  She started smoking about 41 years ago. She has a 15 pack-year smoking history. She has never used smokeless tobacco. Amber Harris reports that she does not drink alcohol.   Review of Systems CONSTITUTIONAL: No weight loss, fever, chills, weakness or fatigue.  HEENT: Eyes: No visual loss, blurred vision, double vision or yellow sclerae.No hearing loss, sneezing, congestion, runny nose or sore throat.  SKIN: No rash or itching.  CARDIOVASCULAR: per HPI RESPIRATORY: No shortness of breath, cough or sputum.  GASTROINTESTINAL: No anorexia, nausea, vomiting or diarrhea. No abdominal pain or blood.  GENITOURINARY: No burning on urination, no polyuria NEUROLOGICAL: No headache, dizziness, syncope, paralysis, ataxia, numbness or tingling in the extremities. No change in bowel or bladder control.  MUSCULOSKELETAL: No muscle, back pain, joint pain or stiffness.  LYMPHATICS: No enlarged nodes. No history of splenectomy.  PSYCHIATRIC: No history of depression or anxiety.  ENDOCRINOLOGIC: No reports of sweating, cold or heat intolerance. No polyuria or polydipsia.  Marland Kitchen   Physical Examination Filed Vitals:   06/10/16 1009  BP: 144/84  Pulse: 58   Filed Vitals:   06/10/16 1009  Height: 5\' 7"  (1.702 m)  Weight: 261 lb 6.4 oz (118.57 kg)    Gen: resting comfortably, no acute distress HEENT: no scleral icterus, pupils equal round and reactive, no palptable cervical adenopathy,  CV: RRR, 2/6 sysotlic murmur RUSB, noj vd Resp: Clear to auscultation bilaterally GI: abdomen is soft, non-tender, non-distended, normal bowel sounds, no hepatosplenomegaly MSK: extremities are warm, no edema.  Skin: warm, no rash Neuro:  no focal deficits Psych: appropriate affect   Diagnostic Studies 10/2012 Event monitor: no arrhythmias   08/2010 Echo:  Left ventricle: The cavity size was normal. Wall thickness was  normal. Systolic function was normal. The estimated ejection   fraction was in the range of 60% to 65%. Wall motion was normal;  there were no regional wall motion abnormalities. The study is not  technically sufficient to allow evaluation of LV diastolic  function.  - Aortic valve: Mildly calcified annulus. Mild regurgitation.  - Mitral valve: Mild regurgitation.  - Tricuspid valve: Mild regurgitation.  - Pericardium, extracardiac: There was no pericardial effusion.   11/14/13 Clinic EKG  Sinus rhythm   10/2013 Echo  LVEF 60-65%, grade II diastolic dysfunction, mild AI, mild MR   01/23/14 Clinic EKG  NSR, LAE, no ischemic changes  Assessment and Plan  1. Valvular heart disease  - mild AI and MR from prior echo 2011. - 10/2013 echo shows stable disease - continue to monitor  2. HTN  - signficiant fatigue after taking meds, llikely due to clonidine. Will wean clonidine and start hydrlazine 50mg  tid.  - chlorthalidone was too expensive .  - she will have bp followed by pcp at her appointment next week, and have a bp check with [place] in 3 weeks   3. Hyperlipidemia  - continue high dose statin. - f/u pcp labs  4. Chronic diastolic heart failure  - appears euvolemic, we will continue current meds   F/u 6 months. Nursing visit with bp check in 3 weeks .   Arnoldo Lenis, M.D.

## 2016-06-29 ENCOUNTER — Encounter: Payer: Self-pay | Admitting: *Deleted

## 2016-06-29 DIAGNOSIS — F3175 Bipolar disorder, in partial remission, most recent episode depressed: Secondary | ICD-10-CM | POA: Diagnosis not present

## 2016-06-29 DIAGNOSIS — M722 Plantar fascial fibromatosis: Secondary | ICD-10-CM | POA: Diagnosis not present

## 2016-06-29 DIAGNOSIS — F338 Other recurrent depressive disorders: Secondary | ICD-10-CM | POA: Diagnosis not present

## 2016-06-29 DIAGNOSIS — E784 Other hyperlipidemia: Secondary | ICD-10-CM | POA: Diagnosis not present

## 2016-06-29 DIAGNOSIS — E6609 Other obesity due to excess calories: Secondary | ICD-10-CM | POA: Diagnosis not present

## 2016-06-29 DIAGNOSIS — E1142 Type 2 diabetes mellitus with diabetic polyneuropathy: Secondary | ICD-10-CM | POA: Diagnosis not present

## 2016-06-29 DIAGNOSIS — I10 Essential (primary) hypertension: Secondary | ICD-10-CM | POA: Diagnosis not present

## 2016-06-30 DIAGNOSIS — M1990 Unspecified osteoarthritis, unspecified site: Secondary | ICD-10-CM | POA: Diagnosis not present

## 2016-06-30 DIAGNOSIS — M79672 Pain in left foot: Secondary | ICD-10-CM | POA: Diagnosis not present

## 2016-06-30 DIAGNOSIS — M79671 Pain in right foot: Secondary | ICD-10-CM | POA: Diagnosis not present

## 2016-06-30 DIAGNOSIS — E1140 Type 2 diabetes mellitus with diabetic neuropathy, unspecified: Secondary | ICD-10-CM | POA: Diagnosis not present

## 2016-07-16 DIAGNOSIS — F329 Major depressive disorder, single episode, unspecified: Secondary | ICD-10-CM | POA: Diagnosis not present

## 2016-07-22 DIAGNOSIS — M1990 Unspecified osteoarthritis, unspecified site: Secondary | ICD-10-CM | POA: Diagnosis not present

## 2016-07-22 DIAGNOSIS — E1140 Type 2 diabetes mellitus with diabetic neuropathy, unspecified: Secondary | ICD-10-CM | POA: Diagnosis not present

## 2016-07-22 DIAGNOSIS — M79672 Pain in left foot: Secondary | ICD-10-CM | POA: Diagnosis not present

## 2016-07-22 DIAGNOSIS — M79671 Pain in right foot: Secondary | ICD-10-CM | POA: Diagnosis not present

## 2016-08-11 ENCOUNTER — Ambulatory Visit: Payer: Medicare Other | Admitting: Neurology

## 2016-08-11 ENCOUNTER — Telehealth: Payer: Self-pay | Admitting: *Deleted

## 2016-08-11 NOTE — Telephone Encounter (Signed)
No showed new patient appointment. 

## 2016-08-12 ENCOUNTER — Encounter: Payer: Self-pay | Admitting: Neurology

## 2016-08-21 DIAGNOSIS — Z23 Encounter for immunization: Secondary | ICD-10-CM | POA: Diagnosis not present

## 2017-01-08 DIAGNOSIS — R1011 Right upper quadrant pain: Secondary | ICD-10-CM | POA: Diagnosis not present

## 2017-01-08 DIAGNOSIS — R112 Nausea with vomiting, unspecified: Secondary | ICD-10-CM | POA: Diagnosis not present

## 2017-01-08 DIAGNOSIS — K838 Other specified diseases of biliary tract: Secondary | ICD-10-CM | POA: Diagnosis not present

## 2017-01-08 DIAGNOSIS — Z8249 Family history of ischemic heart disease and other diseases of the circulatory system: Secondary | ICD-10-CM | POA: Diagnosis not present

## 2017-01-08 DIAGNOSIS — I160 Hypertensive urgency: Secondary | ICD-10-CM | POA: Diagnosis not present

## 2017-01-08 DIAGNOSIS — R079 Chest pain, unspecified: Secondary | ICD-10-CM | POA: Diagnosis not present

## 2017-01-08 DIAGNOSIS — Z23 Encounter for immunization: Secondary | ICD-10-CM | POA: Diagnosis not present

## 2017-01-08 DIAGNOSIS — J449 Chronic obstructive pulmonary disease, unspecified: Secondary | ICD-10-CM | POA: Diagnosis present

## 2017-01-08 DIAGNOSIS — Z794 Long term (current) use of insulin: Secondary | ICD-10-CM | POA: Diagnosis not present

## 2017-01-08 DIAGNOSIS — Z720 Tobacco use: Secondary | ICD-10-CM | POA: Diagnosis not present

## 2017-01-08 DIAGNOSIS — I161 Hypertensive emergency: Secondary | ICD-10-CM | POA: Diagnosis not present

## 2017-01-08 DIAGNOSIS — J1001 Influenza due to other identified influenza virus with the same other identified influenza virus pneumonia: Secondary | ICD-10-CM | POA: Diagnosis not present

## 2017-01-08 DIAGNOSIS — F17200 Nicotine dependence, unspecified, uncomplicated: Secondary | ICD-10-CM | POA: Diagnosis present

## 2017-01-08 DIAGNOSIS — Z886 Allergy status to analgesic agent status: Secondary | ICD-10-CM | POA: Diagnosis not present

## 2017-01-08 DIAGNOSIS — Z801 Family history of malignant neoplasm of trachea, bronchus and lung: Secondary | ICD-10-CM | POA: Diagnosis not present

## 2017-01-08 DIAGNOSIS — F419 Anxiety disorder, unspecified: Secondary | ICD-10-CM | POA: Diagnosis present

## 2017-01-08 DIAGNOSIS — Z818 Family history of other mental and behavioral disorders: Secondary | ICD-10-CM | POA: Diagnosis not present

## 2017-01-08 DIAGNOSIS — F3289 Other specified depressive episodes: Secondary | ICD-10-CM | POA: Diagnosis not present

## 2017-01-08 DIAGNOSIS — J101 Influenza due to other identified influenza virus with other respiratory manifestations: Secondary | ICD-10-CM | POA: Diagnosis not present

## 2017-01-08 DIAGNOSIS — K859 Acute pancreatitis without necrosis or infection, unspecified: Secondary | ICD-10-CM | POA: Diagnosis present

## 2017-01-08 DIAGNOSIS — I10 Essential (primary) hypertension: Secondary | ICD-10-CM | POA: Diagnosis present

## 2017-01-08 DIAGNOSIS — E1140 Type 2 diabetes mellitus with diabetic neuropathy, unspecified: Secondary | ICD-10-CM | POA: Diagnosis not present

## 2017-01-08 DIAGNOSIS — Z880 Allergy status to penicillin: Secondary | ICD-10-CM | POA: Diagnosis not present

## 2017-01-08 DIAGNOSIS — K219 Gastro-esophageal reflux disease without esophagitis: Secondary | ICD-10-CM | POA: Diagnosis present

## 2017-01-08 DIAGNOSIS — R1110 Vomiting, unspecified: Secondary | ICD-10-CM | POA: Diagnosis not present

## 2017-01-08 DIAGNOSIS — F329 Major depressive disorder, single episode, unspecified: Secondary | ICD-10-CM | POA: Diagnosis not present

## 2017-01-08 DIAGNOSIS — K858 Other acute pancreatitis without necrosis or infection: Secondary | ICD-10-CM | POA: Diagnosis not present

## 2017-01-08 DIAGNOSIS — J1089 Influenza due to other identified influenza virus with other manifestations: Secondary | ICD-10-CM | POA: Diagnosis not present

## 2017-01-08 DIAGNOSIS — Z79899 Other long term (current) drug therapy: Secondary | ICD-10-CM | POA: Diagnosis not present

## 2017-01-08 DIAGNOSIS — J4540 Moderate persistent asthma, uncomplicated: Secondary | ICD-10-CM | POA: Diagnosis not present

## 2017-01-30 DIAGNOSIS — E119 Type 2 diabetes mellitus without complications: Secondary | ICD-10-CM | POA: Diagnosis not present

## 2017-01-30 DIAGNOSIS — J45909 Unspecified asthma, uncomplicated: Secondary | ICD-10-CM | POA: Diagnosis not present

## 2017-01-30 DIAGNOSIS — Z833 Family history of diabetes mellitus: Secondary | ICD-10-CM | POA: Diagnosis not present

## 2017-01-30 DIAGNOSIS — J449 Chronic obstructive pulmonary disease, unspecified: Secondary | ICD-10-CM | POA: Diagnosis not present

## 2017-01-30 DIAGNOSIS — S4992XA Unspecified injury of left shoulder and upper arm, initial encounter: Secondary | ICD-10-CM | POA: Diagnosis not present

## 2017-01-30 DIAGNOSIS — Z8249 Family history of ischemic heart disease and other diseases of the circulatory system: Secondary | ICD-10-CM | POA: Diagnosis not present

## 2017-01-30 DIAGNOSIS — F419 Anxiety disorder, unspecified: Secondary | ICD-10-CM | POA: Diagnosis not present

## 2017-01-30 DIAGNOSIS — Z794 Long term (current) use of insulin: Secondary | ICD-10-CM | POA: Diagnosis not present

## 2017-01-30 DIAGNOSIS — M25512 Pain in left shoulder: Secondary | ICD-10-CM | POA: Diagnosis not present

## 2017-01-30 DIAGNOSIS — F17200 Nicotine dependence, unspecified, uncomplicated: Secondary | ICD-10-CM | POA: Diagnosis not present

## 2017-01-30 DIAGNOSIS — Z79899 Other long term (current) drug therapy: Secondary | ICD-10-CM | POA: Diagnosis not present

## 2017-01-30 DIAGNOSIS — F329 Major depressive disorder, single episode, unspecified: Secondary | ICD-10-CM | POA: Diagnosis not present

## 2017-01-30 DIAGNOSIS — I10 Essential (primary) hypertension: Secondary | ICD-10-CM | POA: Diagnosis not present

## 2017-02-03 DIAGNOSIS — Z794 Long term (current) use of insulin: Secondary | ICD-10-CM | POA: Diagnosis not present

## 2017-02-03 DIAGNOSIS — Z79899 Other long term (current) drug therapy: Secondary | ICD-10-CM | POA: Diagnosis not present

## 2017-02-03 DIAGNOSIS — R0789 Other chest pain: Secondary | ICD-10-CM | POA: Diagnosis not present

## 2017-02-03 DIAGNOSIS — R0602 Shortness of breath: Secondary | ICD-10-CM | POA: Diagnosis not present

## 2017-02-03 DIAGNOSIS — N390 Urinary tract infection, site not specified: Secondary | ICD-10-CM | POA: Diagnosis not present

## 2017-02-03 DIAGNOSIS — R05 Cough: Secondary | ICD-10-CM | POA: Diagnosis not present

## 2017-02-03 DIAGNOSIS — J449 Chronic obstructive pulmonary disease, unspecified: Secondary | ICD-10-CM | POA: Diagnosis not present

## 2017-02-03 DIAGNOSIS — F17200 Nicotine dependence, unspecified, uncomplicated: Secondary | ICD-10-CM | POA: Diagnosis not present

## 2017-02-03 DIAGNOSIS — I10 Essential (primary) hypertension: Secondary | ICD-10-CM | POA: Diagnosis not present

## 2017-02-03 DIAGNOSIS — R079 Chest pain, unspecified: Secondary | ICD-10-CM | POA: Diagnosis not present

## 2017-02-03 DIAGNOSIS — E119 Type 2 diabetes mellitus without complications: Secondary | ICD-10-CM | POA: Diagnosis not present

## 2017-04-06 DIAGNOSIS — Z794 Long term (current) use of insulin: Secondary | ICD-10-CM | POA: Diagnosis not present

## 2017-04-06 DIAGNOSIS — M542 Cervicalgia: Secondary | ICD-10-CM | POA: Diagnosis not present

## 2017-04-06 DIAGNOSIS — Z79899 Other long term (current) drug therapy: Secondary | ICD-10-CM | POA: Diagnosis not present

## 2017-04-06 DIAGNOSIS — F17200 Nicotine dependence, unspecified, uncomplicated: Secondary | ICD-10-CM | POA: Diagnosis not present

## 2017-04-06 DIAGNOSIS — M25521 Pain in right elbow: Secondary | ICD-10-CM | POA: Diagnosis not present

## 2017-04-06 DIAGNOSIS — F329 Major depressive disorder, single episode, unspecified: Secondary | ICD-10-CM | POA: Diagnosis not present

## 2017-04-06 DIAGNOSIS — R51 Headache: Secondary | ICD-10-CM | POA: Diagnosis not present

## 2017-04-06 DIAGNOSIS — J449 Chronic obstructive pulmonary disease, unspecified: Secondary | ICD-10-CM | POA: Diagnosis not present

## 2017-04-06 DIAGNOSIS — M4814 Ankylosing hyperostosis [Forestier], thoracic region: Secondary | ICD-10-CM | POA: Diagnosis not present

## 2017-04-06 DIAGNOSIS — I10 Essential (primary) hypertension: Secondary | ICD-10-CM | POA: Diagnosis not present

## 2017-04-06 DIAGNOSIS — E119 Type 2 diabetes mellitus without complications: Secondary | ICD-10-CM | POA: Diagnosis not present

## 2017-05-10 DIAGNOSIS — F3175 Bipolar disorder, in partial remission, most recent episode depressed: Secondary | ICD-10-CM | POA: Diagnosis not present

## 2017-05-10 DIAGNOSIS — I10 Essential (primary) hypertension: Secondary | ICD-10-CM | POA: Diagnosis not present

## 2017-05-10 DIAGNOSIS — E784 Other hyperlipidemia: Secondary | ICD-10-CM | POA: Diagnosis not present

## 2017-05-10 DIAGNOSIS — E1142 Type 2 diabetes mellitus with diabetic polyneuropathy: Secondary | ICD-10-CM | POA: Diagnosis not present

## 2017-05-10 DIAGNOSIS — F338 Other recurrent depressive disorders: Secondary | ICD-10-CM | POA: Diagnosis not present

## 2017-05-10 DIAGNOSIS — E6609 Other obesity due to excess calories: Secondary | ICD-10-CM | POA: Diagnosis not present

## 2017-06-01 DIAGNOSIS — J449 Chronic obstructive pulmonary disease, unspecified: Secondary | ICD-10-CM | POA: Diagnosis not present

## 2017-06-01 DIAGNOSIS — F17200 Nicotine dependence, unspecified, uncomplicated: Secondary | ICD-10-CM | POA: Diagnosis not present

## 2017-06-01 DIAGNOSIS — E119 Type 2 diabetes mellitus without complications: Secondary | ICD-10-CM | POA: Diagnosis not present

## 2017-06-01 DIAGNOSIS — T1592XA Foreign body on external eye, part unspecified, left eye, initial encounter: Secondary | ICD-10-CM | POA: Diagnosis not present

## 2017-06-01 DIAGNOSIS — I10 Essential (primary) hypertension: Secondary | ICD-10-CM | POA: Diagnosis not present

## 2017-09-26 DIAGNOSIS — E1142 Type 2 diabetes mellitus with diabetic polyneuropathy: Secondary | ICD-10-CM | POA: Diagnosis not present

## 2017-09-26 DIAGNOSIS — Z0000 Encounter for general adult medical examination without abnormal findings: Secondary | ICD-10-CM | POA: Diagnosis not present

## 2017-09-26 DIAGNOSIS — I10 Essential (primary) hypertension: Secondary | ICD-10-CM | POA: Diagnosis not present

## 2017-09-26 DIAGNOSIS — F3175 Bipolar disorder, in partial remission, most recent episode depressed: Secondary | ICD-10-CM | POA: Diagnosis not present

## 2017-09-26 DIAGNOSIS — F338 Other recurrent depressive disorders: Secondary | ICD-10-CM | POA: Diagnosis not present

## 2017-09-26 DIAGNOSIS — E6609 Other obesity due to excess calories: Secondary | ICD-10-CM | POA: Diagnosis not present

## 2017-09-26 DIAGNOSIS — E7849 Other hyperlipidemia: Secondary | ICD-10-CM | POA: Diagnosis not present

## 2017-11-14 DIAGNOSIS — Z794 Long term (current) use of insulin: Secondary | ICD-10-CM | POA: Diagnosis not present

## 2017-11-14 DIAGNOSIS — F17200 Nicotine dependence, unspecified, uncomplicated: Secondary | ICD-10-CM | POA: Diagnosis not present

## 2017-11-14 DIAGNOSIS — R079 Chest pain, unspecified: Secondary | ICD-10-CM | POA: Diagnosis not present

## 2017-11-14 DIAGNOSIS — R0789 Other chest pain: Secondary | ICD-10-CM | POA: Diagnosis not present

## 2017-11-14 DIAGNOSIS — J449 Chronic obstructive pulmonary disease, unspecified: Secondary | ICD-10-CM | POA: Diagnosis not present

## 2017-11-14 DIAGNOSIS — Z79899 Other long term (current) drug therapy: Secondary | ICD-10-CM | POA: Diagnosis not present

## 2017-11-14 DIAGNOSIS — E119 Type 2 diabetes mellitus without complications: Secondary | ICD-10-CM | POA: Diagnosis not present

## 2017-11-14 DIAGNOSIS — R110 Nausea: Secondary | ICD-10-CM | POA: Diagnosis not present

## 2017-11-14 DIAGNOSIS — I10 Essential (primary) hypertension: Secondary | ICD-10-CM | POA: Diagnosis not present

## 2017-12-28 DIAGNOSIS — F3175 Bipolar disorder, in partial remission, most recent episode depressed: Secondary | ICD-10-CM | POA: Diagnosis not present

## 2017-12-28 DIAGNOSIS — E6609 Other obesity due to excess calories: Secondary | ICD-10-CM | POA: Diagnosis not present

## 2017-12-28 DIAGNOSIS — F338 Other recurrent depressive disorders: Secondary | ICD-10-CM | POA: Diagnosis not present

## 2017-12-28 DIAGNOSIS — I10 Essential (primary) hypertension: Secondary | ICD-10-CM | POA: Diagnosis not present

## 2017-12-28 DIAGNOSIS — E1142 Type 2 diabetes mellitus with diabetic polyneuropathy: Secondary | ICD-10-CM | POA: Diagnosis not present

## 2017-12-28 DIAGNOSIS — E7849 Other hyperlipidemia: Secondary | ICD-10-CM | POA: Diagnosis not present

## 2018-01-15 DIAGNOSIS — I10 Essential (primary) hypertension: Secondary | ICD-10-CM | POA: Diagnosis not present

## 2018-01-15 DIAGNOSIS — A599 Trichomoniasis, unspecified: Secondary | ICD-10-CM | POA: Diagnosis not present

## 2018-01-15 DIAGNOSIS — Z79899 Other long term (current) drug therapy: Secondary | ICD-10-CM | POA: Diagnosis not present

## 2018-01-15 DIAGNOSIS — R0602 Shortness of breath: Secondary | ICD-10-CM | POA: Diagnosis not present

## 2018-01-15 DIAGNOSIS — R079 Chest pain, unspecified: Secondary | ICD-10-CM | POA: Diagnosis not present

## 2018-01-15 DIAGNOSIS — E119 Type 2 diabetes mellitus without complications: Secondary | ICD-10-CM | POA: Diagnosis not present

## 2018-01-15 DIAGNOSIS — J449 Chronic obstructive pulmonary disease, unspecified: Secondary | ICD-10-CM | POA: Diagnosis not present

## 2018-01-15 DIAGNOSIS — Z794 Long term (current) use of insulin: Secondary | ICD-10-CM | POA: Diagnosis not present

## 2018-01-15 DIAGNOSIS — R109 Unspecified abdominal pain: Secondary | ICD-10-CM | POA: Diagnosis not present

## 2018-01-15 DIAGNOSIS — I160 Hypertensive urgency: Secondary | ICD-10-CM | POA: Diagnosis not present

## 2018-01-15 DIAGNOSIS — F17200 Nicotine dependence, unspecified, uncomplicated: Secondary | ICD-10-CM | POA: Diagnosis not present

## 2018-02-08 DIAGNOSIS — Z0100 Encounter for examination of eyes and vision without abnormal findings: Secondary | ICD-10-CM | POA: Diagnosis not present

## 2018-02-08 DIAGNOSIS — I10 Essential (primary) hypertension: Secondary | ICD-10-CM | POA: Diagnosis not present

## 2018-02-08 DIAGNOSIS — H5200 Hypermetropia, unspecified eye: Secondary | ICD-10-CM | POA: Diagnosis not present

## 2018-02-08 DIAGNOSIS — E119 Type 2 diabetes mellitus without complications: Secondary | ICD-10-CM | POA: Diagnosis not present

## 2018-04-03 DIAGNOSIS — I10 Essential (primary) hypertension: Secondary | ICD-10-CM | POA: Diagnosis not present

## 2018-04-03 DIAGNOSIS — F3175 Bipolar disorder, in partial remission, most recent episode depressed: Secondary | ICD-10-CM | POA: Diagnosis not present

## 2018-04-03 DIAGNOSIS — E7849 Other hyperlipidemia: Secondary | ICD-10-CM | POA: Diagnosis not present

## 2018-04-03 DIAGNOSIS — E1142 Type 2 diabetes mellitus with diabetic polyneuropathy: Secondary | ICD-10-CM | POA: Diagnosis not present

## 2018-04-03 DIAGNOSIS — F338 Other recurrent depressive disorders: Secondary | ICD-10-CM | POA: Diagnosis not present

## 2018-04-03 DIAGNOSIS — E6609 Other obesity due to excess calories: Secondary | ICD-10-CM | POA: Diagnosis not present

## 2018-04-03 DIAGNOSIS — Z6838 Body mass index (BMI) 38.0-38.9, adult: Secondary | ICD-10-CM | POA: Diagnosis not present

## 2018-05-21 DIAGNOSIS — R600 Localized edema: Secondary | ICD-10-CM | POA: Diagnosis not present

## 2018-05-21 DIAGNOSIS — E1165 Type 2 diabetes mellitus with hyperglycemia: Secondary | ICD-10-CM | POA: Diagnosis not present

## 2018-05-21 DIAGNOSIS — R51 Headache: Secondary | ICD-10-CM | POA: Diagnosis not present

## 2018-05-21 DIAGNOSIS — K839 Disease of biliary tract, unspecified: Secondary | ICD-10-CM | POA: Diagnosis not present

## 2018-05-21 DIAGNOSIS — R1012 Left upper quadrant pain: Secondary | ICD-10-CM | POA: Diagnosis not present

## 2018-05-21 DIAGNOSIS — R42 Dizziness and giddiness: Secondary | ICD-10-CM | POA: Diagnosis not present

## 2018-05-21 DIAGNOSIS — R932 Abnormal findings on diagnostic imaging of liver and biliary tract: Secondary | ICD-10-CM | POA: Diagnosis not present

## 2018-05-21 DIAGNOSIS — E785 Hyperlipidemia, unspecified: Secondary | ICD-10-CM | POA: Diagnosis not present

## 2018-05-21 DIAGNOSIS — I10 Essential (primary) hypertension: Secondary | ICD-10-CM | POA: Diagnosis not present

## 2018-05-21 DIAGNOSIS — I639 Cerebral infarction, unspecified: Secondary | ICD-10-CM | POA: Diagnosis not present

## 2018-05-21 DIAGNOSIS — E1140 Type 2 diabetes mellitus with diabetic neuropathy, unspecified: Secondary | ICD-10-CM | POA: Diagnosis not present

## 2018-05-21 DIAGNOSIS — I6389 Other cerebral infarction: Secondary | ICD-10-CM | POA: Diagnosis not present

## 2018-05-21 DIAGNOSIS — Q453 Other congenital malformations of pancreas and pancreatic duct: Secondary | ICD-10-CM | POA: Diagnosis not present

## 2018-05-21 DIAGNOSIS — I63231 Cerebral infarction due to unspecified occlusion or stenosis of right carotid arteries: Secondary | ICD-10-CM | POA: Diagnosis not present

## 2018-05-21 DIAGNOSIS — G8194 Hemiplegia, unspecified affecting left nondominant side: Secondary | ICD-10-CM | POA: Diagnosis not present

## 2018-05-21 DIAGNOSIS — K5900 Constipation, unspecified: Secondary | ICD-10-CM | POA: Diagnosis not present

## 2018-05-21 DIAGNOSIS — K5909 Other constipation: Secondary | ICD-10-CM | POA: Diagnosis not present

## 2018-05-21 DIAGNOSIS — I6521 Occlusion and stenosis of right carotid artery: Secondary | ICD-10-CM | POA: Diagnosis not present

## 2018-05-21 DIAGNOSIS — I6381 Other cerebral infarction due to occlusion or stenosis of small artery: Secondary | ICD-10-CM | POA: Diagnosis not present

## 2018-05-21 DIAGNOSIS — I700 Atherosclerosis of aorta: Secondary | ICD-10-CM | POA: Diagnosis not present

## 2018-05-21 DIAGNOSIS — G8102 Flaccid hemiplegia affecting left dominant side: Secondary | ICD-10-CM | POA: Diagnosis not present

## 2018-05-27 DIAGNOSIS — M7989 Other specified soft tissue disorders: Secondary | ICD-10-CM | POA: Diagnosis not present

## 2018-05-27 DIAGNOSIS — I700 Atherosclerosis of aorta: Secondary | ICD-10-CM | POA: Diagnosis not present

## 2018-05-27 DIAGNOSIS — M79604 Pain in right leg: Secondary | ICD-10-CM | POA: Diagnosis not present

## 2018-05-27 DIAGNOSIS — F329 Major depressive disorder, single episode, unspecified: Secondary | ICD-10-CM | POA: Diagnosis not present

## 2018-05-27 DIAGNOSIS — I10 Essential (primary) hypertension: Secondary | ICD-10-CM | POA: Diagnosis not present

## 2018-05-27 DIAGNOSIS — E1142 Type 2 diabetes mellitus with diabetic polyneuropathy: Secondary | ICD-10-CM | POA: Diagnosis not present

## 2018-05-27 DIAGNOSIS — J45909 Unspecified asthma, uncomplicated: Secondary | ICD-10-CM | POA: Diagnosis not present

## 2018-05-27 DIAGNOSIS — I69354 Hemiplegia and hemiparesis following cerebral infarction affecting left non-dominant side: Secondary | ICD-10-CM | POA: Diagnosis not present

## 2018-05-27 DIAGNOSIS — K8689 Other specified diseases of pancreas: Secondary | ICD-10-CM | POA: Diagnosis not present

## 2018-05-28 DIAGNOSIS — F329 Major depressive disorder, single episode, unspecified: Secondary | ICD-10-CM | POA: Diagnosis not present

## 2018-05-28 DIAGNOSIS — J45909 Unspecified asthma, uncomplicated: Secondary | ICD-10-CM | POA: Diagnosis not present

## 2018-05-28 DIAGNOSIS — I700 Atherosclerosis of aorta: Secondary | ICD-10-CM | POA: Diagnosis not present

## 2018-05-28 DIAGNOSIS — I10 Essential (primary) hypertension: Secondary | ICD-10-CM | POA: Diagnosis not present

## 2018-05-28 DIAGNOSIS — M7989 Other specified soft tissue disorders: Secondary | ICD-10-CM | POA: Diagnosis not present

## 2018-05-28 DIAGNOSIS — I69354 Hemiplegia and hemiparesis following cerebral infarction affecting left non-dominant side: Secondary | ICD-10-CM | POA: Diagnosis not present

## 2018-05-28 DIAGNOSIS — M79604 Pain in right leg: Secondary | ICD-10-CM | POA: Diagnosis not present

## 2018-05-28 DIAGNOSIS — E1142 Type 2 diabetes mellitus with diabetic polyneuropathy: Secondary | ICD-10-CM | POA: Diagnosis not present

## 2018-05-28 DIAGNOSIS — K8689 Other specified diseases of pancreas: Secondary | ICD-10-CM | POA: Diagnosis not present

## 2018-05-29 DIAGNOSIS — E1142 Type 2 diabetes mellitus with diabetic polyneuropathy: Secondary | ICD-10-CM | POA: Diagnosis not present

## 2018-05-29 DIAGNOSIS — M7989 Other specified soft tissue disorders: Secondary | ICD-10-CM | POA: Diagnosis not present

## 2018-05-29 DIAGNOSIS — K8689 Other specified diseases of pancreas: Secondary | ICD-10-CM | POA: Diagnosis not present

## 2018-05-29 DIAGNOSIS — J45909 Unspecified asthma, uncomplicated: Secondary | ICD-10-CM | POA: Diagnosis not present

## 2018-05-29 DIAGNOSIS — I10 Essential (primary) hypertension: Secondary | ICD-10-CM | POA: Diagnosis not present

## 2018-05-29 DIAGNOSIS — I700 Atherosclerosis of aorta: Secondary | ICD-10-CM | POA: Diagnosis not present

## 2018-05-29 DIAGNOSIS — F329 Major depressive disorder, single episode, unspecified: Secondary | ICD-10-CM | POA: Diagnosis not present

## 2018-05-29 DIAGNOSIS — M79604 Pain in right leg: Secondary | ICD-10-CM | POA: Diagnosis not present

## 2018-05-29 DIAGNOSIS — I69354 Hemiplegia and hemiparesis following cerebral infarction affecting left non-dominant side: Secondary | ICD-10-CM | POA: Diagnosis not present

## 2018-05-30 DIAGNOSIS — I700 Atherosclerosis of aorta: Secondary | ICD-10-CM | POA: Diagnosis not present

## 2018-05-30 DIAGNOSIS — I69354 Hemiplegia and hemiparesis following cerebral infarction affecting left non-dominant side: Secondary | ICD-10-CM | POA: Diagnosis not present

## 2018-05-30 DIAGNOSIS — M79604 Pain in right leg: Secondary | ICD-10-CM | POA: Diagnosis not present

## 2018-05-30 DIAGNOSIS — E1142 Type 2 diabetes mellitus with diabetic polyneuropathy: Secondary | ICD-10-CM | POA: Diagnosis not present

## 2018-05-30 DIAGNOSIS — M7989 Other specified soft tissue disorders: Secondary | ICD-10-CM | POA: Diagnosis not present

## 2018-05-30 DIAGNOSIS — K8689 Other specified diseases of pancreas: Secondary | ICD-10-CM | POA: Diagnosis not present

## 2018-05-30 DIAGNOSIS — J45909 Unspecified asthma, uncomplicated: Secondary | ICD-10-CM | POA: Diagnosis not present

## 2018-05-30 DIAGNOSIS — F329 Major depressive disorder, single episode, unspecified: Secondary | ICD-10-CM | POA: Diagnosis not present

## 2018-05-30 DIAGNOSIS — I10 Essential (primary) hypertension: Secondary | ICD-10-CM | POA: Diagnosis not present

## 2018-06-01 DIAGNOSIS — I69354 Hemiplegia and hemiparesis following cerebral infarction affecting left non-dominant side: Secondary | ICD-10-CM | POA: Diagnosis not present

## 2018-06-01 DIAGNOSIS — F329 Major depressive disorder, single episode, unspecified: Secondary | ICD-10-CM | POA: Diagnosis not present

## 2018-06-01 DIAGNOSIS — M7989 Other specified soft tissue disorders: Secondary | ICD-10-CM | POA: Diagnosis not present

## 2018-06-01 DIAGNOSIS — E1142 Type 2 diabetes mellitus with diabetic polyneuropathy: Secondary | ICD-10-CM | POA: Diagnosis not present

## 2018-06-01 DIAGNOSIS — J45909 Unspecified asthma, uncomplicated: Secondary | ICD-10-CM | POA: Diagnosis not present

## 2018-06-01 DIAGNOSIS — I700 Atherosclerosis of aorta: Secondary | ICD-10-CM | POA: Diagnosis not present

## 2018-06-01 DIAGNOSIS — I10 Essential (primary) hypertension: Secondary | ICD-10-CM | POA: Diagnosis not present

## 2018-06-01 DIAGNOSIS — M79604 Pain in right leg: Secondary | ICD-10-CM | POA: Diagnosis not present

## 2018-06-01 DIAGNOSIS — K8689 Other specified diseases of pancreas: Secondary | ICD-10-CM | POA: Diagnosis not present

## 2018-06-02 DIAGNOSIS — E1142 Type 2 diabetes mellitus with diabetic polyneuropathy: Secondary | ICD-10-CM | POA: Diagnosis not present

## 2018-06-02 DIAGNOSIS — M7989 Other specified soft tissue disorders: Secondary | ICD-10-CM | POA: Diagnosis not present

## 2018-06-02 DIAGNOSIS — I700 Atherosclerosis of aorta: Secondary | ICD-10-CM | POA: Diagnosis not present

## 2018-06-02 DIAGNOSIS — K8689 Other specified diseases of pancreas: Secondary | ICD-10-CM | POA: Diagnosis not present

## 2018-06-02 DIAGNOSIS — I10 Essential (primary) hypertension: Secondary | ICD-10-CM | POA: Diagnosis not present

## 2018-06-02 DIAGNOSIS — J45909 Unspecified asthma, uncomplicated: Secondary | ICD-10-CM | POA: Diagnosis not present

## 2018-06-02 DIAGNOSIS — I69354 Hemiplegia and hemiparesis following cerebral infarction affecting left non-dominant side: Secondary | ICD-10-CM | POA: Diagnosis not present

## 2018-06-02 DIAGNOSIS — M79604 Pain in right leg: Secondary | ICD-10-CM | POA: Diagnosis not present

## 2018-06-02 DIAGNOSIS — F329 Major depressive disorder, single episode, unspecified: Secondary | ICD-10-CM | POA: Diagnosis not present

## 2018-06-06 DIAGNOSIS — Z6838 Body mass index (BMI) 38.0-38.9, adult: Secondary | ICD-10-CM | POA: Diagnosis not present

## 2018-06-06 DIAGNOSIS — K8689 Other specified diseases of pancreas: Secondary | ICD-10-CM | POA: Diagnosis not present

## 2018-06-06 DIAGNOSIS — I700 Atherosclerosis of aorta: Secondary | ICD-10-CM | POA: Diagnosis not present

## 2018-06-06 DIAGNOSIS — M7989 Other specified soft tissue disorders: Secondary | ICD-10-CM | POA: Diagnosis not present

## 2018-06-06 DIAGNOSIS — F329 Major depressive disorder, single episode, unspecified: Secondary | ICD-10-CM | POA: Diagnosis not present

## 2018-06-06 DIAGNOSIS — M79604 Pain in right leg: Secondary | ICD-10-CM | POA: Diagnosis not present

## 2018-06-06 DIAGNOSIS — I69354 Hemiplegia and hemiparesis following cerebral infarction affecting left non-dominant side: Secondary | ICD-10-CM | POA: Diagnosis not present

## 2018-06-06 DIAGNOSIS — E6609 Other obesity due to excess calories: Secondary | ICD-10-CM | POA: Diagnosis not present

## 2018-06-06 DIAGNOSIS — E1142 Type 2 diabetes mellitus with diabetic polyneuropathy: Secondary | ICD-10-CM | POA: Diagnosis not present

## 2018-06-06 DIAGNOSIS — J45909 Unspecified asthma, uncomplicated: Secondary | ICD-10-CM | POA: Diagnosis not present

## 2018-06-06 DIAGNOSIS — I63311 Cerebral infarction due to thrombosis of right middle cerebral artery: Secondary | ICD-10-CM | POA: Diagnosis not present

## 2018-06-06 DIAGNOSIS — I10 Essential (primary) hypertension: Secondary | ICD-10-CM | POA: Diagnosis not present

## 2018-06-08 DIAGNOSIS — E1142 Type 2 diabetes mellitus with diabetic polyneuropathy: Secondary | ICD-10-CM | POA: Diagnosis not present

## 2018-06-08 DIAGNOSIS — J45909 Unspecified asthma, uncomplicated: Secondary | ICD-10-CM | POA: Diagnosis not present

## 2018-06-08 DIAGNOSIS — M7989 Other specified soft tissue disorders: Secondary | ICD-10-CM | POA: Diagnosis not present

## 2018-06-08 DIAGNOSIS — M79604 Pain in right leg: Secondary | ICD-10-CM | POA: Diagnosis not present

## 2018-06-08 DIAGNOSIS — K8689 Other specified diseases of pancreas: Secondary | ICD-10-CM | POA: Diagnosis not present

## 2018-06-08 DIAGNOSIS — I10 Essential (primary) hypertension: Secondary | ICD-10-CM | POA: Diagnosis not present

## 2018-06-08 DIAGNOSIS — I69354 Hemiplegia and hemiparesis following cerebral infarction affecting left non-dominant side: Secondary | ICD-10-CM | POA: Diagnosis not present

## 2018-06-08 DIAGNOSIS — I700 Atherosclerosis of aorta: Secondary | ICD-10-CM | POA: Diagnosis not present

## 2018-06-08 DIAGNOSIS — F329 Major depressive disorder, single episode, unspecified: Secondary | ICD-10-CM | POA: Diagnosis not present

## 2018-06-09 DIAGNOSIS — J45909 Unspecified asthma, uncomplicated: Secondary | ICD-10-CM | POA: Diagnosis not present

## 2018-06-09 DIAGNOSIS — M7989 Other specified soft tissue disorders: Secondary | ICD-10-CM | POA: Diagnosis not present

## 2018-06-09 DIAGNOSIS — F329 Major depressive disorder, single episode, unspecified: Secondary | ICD-10-CM | POA: Diagnosis not present

## 2018-06-09 DIAGNOSIS — I69354 Hemiplegia and hemiparesis following cerebral infarction affecting left non-dominant side: Secondary | ICD-10-CM | POA: Diagnosis not present

## 2018-06-09 DIAGNOSIS — E1142 Type 2 diabetes mellitus with diabetic polyneuropathy: Secondary | ICD-10-CM | POA: Diagnosis not present

## 2018-06-09 DIAGNOSIS — K8689 Other specified diseases of pancreas: Secondary | ICD-10-CM | POA: Diagnosis not present

## 2018-06-09 DIAGNOSIS — I10 Essential (primary) hypertension: Secondary | ICD-10-CM | POA: Diagnosis not present

## 2018-06-09 DIAGNOSIS — M79604 Pain in right leg: Secondary | ICD-10-CM | POA: Diagnosis not present

## 2018-06-09 DIAGNOSIS — I700 Atherosclerosis of aorta: Secondary | ICD-10-CM | POA: Diagnosis not present

## 2018-06-12 DIAGNOSIS — E1142 Type 2 diabetes mellitus with diabetic polyneuropathy: Secondary | ICD-10-CM | POA: Diagnosis not present

## 2018-06-12 DIAGNOSIS — K8689 Other specified diseases of pancreas: Secondary | ICD-10-CM | POA: Diagnosis not present

## 2018-06-12 DIAGNOSIS — I700 Atherosclerosis of aorta: Secondary | ICD-10-CM | POA: Diagnosis not present

## 2018-06-12 DIAGNOSIS — F329 Major depressive disorder, single episode, unspecified: Secondary | ICD-10-CM | POA: Diagnosis not present

## 2018-06-12 DIAGNOSIS — I69354 Hemiplegia and hemiparesis following cerebral infarction affecting left non-dominant side: Secondary | ICD-10-CM | POA: Diagnosis not present

## 2018-06-12 DIAGNOSIS — I10 Essential (primary) hypertension: Secondary | ICD-10-CM | POA: Diagnosis not present

## 2018-06-12 DIAGNOSIS — M7989 Other specified soft tissue disorders: Secondary | ICD-10-CM | POA: Diagnosis not present

## 2018-06-12 DIAGNOSIS — M79604 Pain in right leg: Secondary | ICD-10-CM | POA: Diagnosis not present

## 2018-06-12 DIAGNOSIS — J45909 Unspecified asthma, uncomplicated: Secondary | ICD-10-CM | POA: Diagnosis not present

## 2018-06-13 DIAGNOSIS — I700 Atherosclerosis of aorta: Secondary | ICD-10-CM | POA: Diagnosis not present

## 2018-06-13 DIAGNOSIS — J45909 Unspecified asthma, uncomplicated: Secondary | ICD-10-CM | POA: Diagnosis not present

## 2018-06-13 DIAGNOSIS — M7989 Other specified soft tissue disorders: Secondary | ICD-10-CM | POA: Diagnosis not present

## 2018-06-13 DIAGNOSIS — M79604 Pain in right leg: Secondary | ICD-10-CM | POA: Diagnosis not present

## 2018-06-13 DIAGNOSIS — E1142 Type 2 diabetes mellitus with diabetic polyneuropathy: Secondary | ICD-10-CM | POA: Diagnosis not present

## 2018-06-13 DIAGNOSIS — F329 Major depressive disorder, single episode, unspecified: Secondary | ICD-10-CM | POA: Diagnosis not present

## 2018-06-13 DIAGNOSIS — I69354 Hemiplegia and hemiparesis following cerebral infarction affecting left non-dominant side: Secondary | ICD-10-CM | POA: Diagnosis not present

## 2018-06-13 DIAGNOSIS — I10 Essential (primary) hypertension: Secondary | ICD-10-CM | POA: Diagnosis not present

## 2018-06-13 DIAGNOSIS — K8689 Other specified diseases of pancreas: Secondary | ICD-10-CM | POA: Diagnosis not present

## 2018-06-15 DIAGNOSIS — F329 Major depressive disorder, single episode, unspecified: Secondary | ICD-10-CM | POA: Diagnosis not present

## 2018-06-15 DIAGNOSIS — I700 Atherosclerosis of aorta: Secondary | ICD-10-CM | POA: Diagnosis not present

## 2018-06-15 DIAGNOSIS — M7989 Other specified soft tissue disorders: Secondary | ICD-10-CM | POA: Diagnosis not present

## 2018-06-15 DIAGNOSIS — K8689 Other specified diseases of pancreas: Secondary | ICD-10-CM | POA: Diagnosis not present

## 2018-06-15 DIAGNOSIS — E1142 Type 2 diabetes mellitus with diabetic polyneuropathy: Secondary | ICD-10-CM | POA: Diagnosis not present

## 2018-06-15 DIAGNOSIS — J45909 Unspecified asthma, uncomplicated: Secondary | ICD-10-CM | POA: Diagnosis not present

## 2018-06-15 DIAGNOSIS — I69354 Hemiplegia and hemiparesis following cerebral infarction affecting left non-dominant side: Secondary | ICD-10-CM | POA: Diagnosis not present

## 2018-06-15 DIAGNOSIS — M79604 Pain in right leg: Secondary | ICD-10-CM | POA: Diagnosis not present

## 2018-06-15 DIAGNOSIS — I10 Essential (primary) hypertension: Secondary | ICD-10-CM | POA: Diagnosis not present

## 2018-06-16 DIAGNOSIS — I700 Atherosclerosis of aorta: Secondary | ICD-10-CM | POA: Diagnosis not present

## 2018-06-16 DIAGNOSIS — M79604 Pain in right leg: Secondary | ICD-10-CM | POA: Diagnosis not present

## 2018-06-16 DIAGNOSIS — M7989 Other specified soft tissue disorders: Secondary | ICD-10-CM | POA: Diagnosis not present

## 2018-06-16 DIAGNOSIS — I10 Essential (primary) hypertension: Secondary | ICD-10-CM | POA: Diagnosis not present

## 2018-06-16 DIAGNOSIS — I69354 Hemiplegia and hemiparesis following cerebral infarction affecting left non-dominant side: Secondary | ICD-10-CM | POA: Diagnosis not present

## 2018-06-16 DIAGNOSIS — F329 Major depressive disorder, single episode, unspecified: Secondary | ICD-10-CM | POA: Diagnosis not present

## 2018-06-16 DIAGNOSIS — J45909 Unspecified asthma, uncomplicated: Secondary | ICD-10-CM | POA: Diagnosis not present

## 2018-06-16 DIAGNOSIS — E1142 Type 2 diabetes mellitus with diabetic polyneuropathy: Secondary | ICD-10-CM | POA: Diagnosis not present

## 2018-06-16 DIAGNOSIS — K8689 Other specified diseases of pancreas: Secondary | ICD-10-CM | POA: Diagnosis not present

## 2018-06-23 DIAGNOSIS — I700 Atherosclerosis of aorta: Secondary | ICD-10-CM | POA: Diagnosis not present

## 2018-06-23 DIAGNOSIS — I69354 Hemiplegia and hemiparesis following cerebral infarction affecting left non-dominant side: Secondary | ICD-10-CM | POA: Diagnosis not present

## 2018-06-23 DIAGNOSIS — F329 Major depressive disorder, single episode, unspecified: Secondary | ICD-10-CM | POA: Diagnosis not present

## 2018-06-23 DIAGNOSIS — E1142 Type 2 diabetes mellitus with diabetic polyneuropathy: Secondary | ICD-10-CM | POA: Diagnosis not present

## 2018-06-23 DIAGNOSIS — M7989 Other specified soft tissue disorders: Secondary | ICD-10-CM | POA: Diagnosis not present

## 2018-06-23 DIAGNOSIS — I10 Essential (primary) hypertension: Secondary | ICD-10-CM | POA: Diagnosis not present

## 2018-06-23 DIAGNOSIS — J45909 Unspecified asthma, uncomplicated: Secondary | ICD-10-CM | POA: Diagnosis not present

## 2018-06-23 DIAGNOSIS — K8689 Other specified diseases of pancreas: Secondary | ICD-10-CM | POA: Diagnosis not present

## 2018-06-23 DIAGNOSIS — M79604 Pain in right leg: Secondary | ICD-10-CM | POA: Diagnosis not present

## 2018-06-28 DIAGNOSIS — M79604 Pain in right leg: Secondary | ICD-10-CM | POA: Diagnosis not present

## 2018-06-28 DIAGNOSIS — I10 Essential (primary) hypertension: Secondary | ICD-10-CM | POA: Diagnosis not present

## 2018-06-28 DIAGNOSIS — J45909 Unspecified asthma, uncomplicated: Secondary | ICD-10-CM | POA: Diagnosis not present

## 2018-06-28 DIAGNOSIS — I700 Atherosclerosis of aorta: Secondary | ICD-10-CM | POA: Diagnosis not present

## 2018-06-28 DIAGNOSIS — K8689 Other specified diseases of pancreas: Secondary | ICD-10-CM | POA: Diagnosis not present

## 2018-06-28 DIAGNOSIS — I69354 Hemiplegia and hemiparesis following cerebral infarction affecting left non-dominant side: Secondary | ICD-10-CM | POA: Diagnosis not present

## 2018-06-28 DIAGNOSIS — F329 Major depressive disorder, single episode, unspecified: Secondary | ICD-10-CM | POA: Diagnosis not present

## 2018-06-28 DIAGNOSIS — E1142 Type 2 diabetes mellitus with diabetic polyneuropathy: Secondary | ICD-10-CM | POA: Diagnosis not present

## 2018-06-28 DIAGNOSIS — M7989 Other specified soft tissue disorders: Secondary | ICD-10-CM | POA: Diagnosis not present

## 2018-07-03 DIAGNOSIS — K8689 Other specified diseases of pancreas: Secondary | ICD-10-CM | POA: Diagnosis not present

## 2018-07-03 DIAGNOSIS — J45909 Unspecified asthma, uncomplicated: Secondary | ICD-10-CM | POA: Diagnosis not present

## 2018-07-03 DIAGNOSIS — F329 Major depressive disorder, single episode, unspecified: Secondary | ICD-10-CM | POA: Diagnosis not present

## 2018-07-03 DIAGNOSIS — M79604 Pain in right leg: Secondary | ICD-10-CM | POA: Diagnosis not present

## 2018-07-03 DIAGNOSIS — I700 Atherosclerosis of aorta: Secondary | ICD-10-CM | POA: Diagnosis not present

## 2018-07-03 DIAGNOSIS — M7989 Other specified soft tissue disorders: Secondary | ICD-10-CM | POA: Diagnosis not present

## 2018-07-03 DIAGNOSIS — I10 Essential (primary) hypertension: Secondary | ICD-10-CM | POA: Diagnosis not present

## 2018-07-03 DIAGNOSIS — I69354 Hemiplegia and hemiparesis following cerebral infarction affecting left non-dominant side: Secondary | ICD-10-CM | POA: Diagnosis not present

## 2018-07-03 DIAGNOSIS — E1142 Type 2 diabetes mellitus with diabetic polyneuropathy: Secondary | ICD-10-CM | POA: Diagnosis not present

## 2018-07-14 DIAGNOSIS — E1142 Type 2 diabetes mellitus with diabetic polyneuropathy: Secondary | ICD-10-CM | POA: Diagnosis not present

## 2018-07-14 DIAGNOSIS — F329 Major depressive disorder, single episode, unspecified: Secondary | ICD-10-CM | POA: Diagnosis not present

## 2018-07-14 DIAGNOSIS — I69354 Hemiplegia and hemiparesis following cerebral infarction affecting left non-dominant side: Secondary | ICD-10-CM | POA: Diagnosis not present

## 2018-07-14 DIAGNOSIS — I700 Atherosclerosis of aorta: Secondary | ICD-10-CM | POA: Diagnosis not present

## 2018-07-14 DIAGNOSIS — M7989 Other specified soft tissue disorders: Secondary | ICD-10-CM | POA: Diagnosis not present

## 2018-07-14 DIAGNOSIS — I10 Essential (primary) hypertension: Secondary | ICD-10-CM | POA: Diagnosis not present

## 2018-07-14 DIAGNOSIS — J45909 Unspecified asthma, uncomplicated: Secondary | ICD-10-CM | POA: Diagnosis not present

## 2018-07-14 DIAGNOSIS — K8689 Other specified diseases of pancreas: Secondary | ICD-10-CM | POA: Diagnosis not present

## 2018-07-14 DIAGNOSIS — M79604 Pain in right leg: Secondary | ICD-10-CM | POA: Diagnosis not present

## 2018-07-16 DIAGNOSIS — I10 Essential (primary) hypertension: Secondary | ICD-10-CM | POA: Diagnosis not present

## 2018-07-16 DIAGNOSIS — R410 Disorientation, unspecified: Secondary | ICD-10-CM | POA: Diagnosis not present

## 2018-07-16 DIAGNOSIS — R55 Syncope and collapse: Secondary | ICD-10-CM | POA: Diagnosis not present

## 2018-07-16 DIAGNOSIS — S0990XA Unspecified injury of head, initial encounter: Secondary | ICD-10-CM | POA: Diagnosis not present

## 2018-07-16 DIAGNOSIS — E7800 Pure hypercholesterolemia, unspecified: Secondary | ICD-10-CM | POA: Diagnosis not present

## 2018-07-16 DIAGNOSIS — R4182 Altered mental status, unspecified: Secondary | ICD-10-CM | POA: Diagnosis not present

## 2018-07-16 DIAGNOSIS — R52 Pain, unspecified: Secondary | ICD-10-CM | POA: Diagnosis not present

## 2018-07-16 DIAGNOSIS — R42 Dizziness and giddiness: Secondary | ICD-10-CM | POA: Diagnosis not present

## 2018-07-16 DIAGNOSIS — Z8673 Personal history of transient ischemic attack (TIA), and cerebral infarction without residual deficits: Secondary | ICD-10-CM | POA: Diagnosis not present

## 2018-07-16 DIAGNOSIS — F329 Major depressive disorder, single episode, unspecified: Secondary | ICD-10-CM | POA: Diagnosis not present

## 2018-07-16 DIAGNOSIS — Z79899 Other long term (current) drug therapy: Secondary | ICD-10-CM | POA: Diagnosis not present

## 2018-07-16 DIAGNOSIS — Z794 Long term (current) use of insulin: Secondary | ICD-10-CM | POA: Diagnosis not present

## 2018-07-16 DIAGNOSIS — G4489 Other headache syndrome: Secondary | ICD-10-CM | POA: Diagnosis not present

## 2018-07-16 DIAGNOSIS — E119 Type 2 diabetes mellitus without complications: Secondary | ICD-10-CM | POA: Diagnosis not present

## 2018-09-25 DIAGNOSIS — K210 Gastro-esophageal reflux disease with esophagitis: Secondary | ICD-10-CM | POA: Diagnosis not present

## 2018-09-25 DIAGNOSIS — Z0000 Encounter for general adult medical examination without abnormal findings: Secondary | ICD-10-CM | POA: Diagnosis not present

## 2018-09-25 DIAGNOSIS — I63311 Cerebral infarction due to thrombosis of right middle cerebral artery: Secondary | ICD-10-CM | POA: Diagnosis not present

## 2018-09-25 DIAGNOSIS — E6609 Other obesity due to excess calories: Secondary | ICD-10-CM | POA: Diagnosis not present

## 2018-09-25 DIAGNOSIS — Z6836 Body mass index (BMI) 36.0-36.9, adult: Secondary | ICD-10-CM | POA: Diagnosis not present

## 2018-09-25 DIAGNOSIS — E782 Mixed hyperlipidemia: Secondary | ICD-10-CM | POA: Diagnosis not present

## 2018-09-25 DIAGNOSIS — F3341 Major depressive disorder, recurrent, in partial remission: Secondary | ICD-10-CM | POA: Diagnosis not present

## 2018-09-25 DIAGNOSIS — E11610 Type 2 diabetes mellitus with diabetic neuropathic arthropathy: Secondary | ICD-10-CM | POA: Diagnosis not present

## 2018-09-25 DIAGNOSIS — I10 Essential (primary) hypertension: Secondary | ICD-10-CM | POA: Diagnosis not present

## 2018-09-25 DIAGNOSIS — Z6838 Body mass index (BMI) 38.0-38.9, adult: Secondary | ICD-10-CM | POA: Diagnosis not present

## 2018-10-16 DIAGNOSIS — Z1231 Encounter for screening mammogram for malignant neoplasm of breast: Secondary | ICD-10-CM | POA: Diagnosis not present

## 2019-01-17 DIAGNOSIS — Z0000 Encounter for general adult medical examination without abnormal findings: Secondary | ICD-10-CM | POA: Diagnosis not present

## 2019-01-17 DIAGNOSIS — I10 Essential (primary) hypertension: Secondary | ICD-10-CM | POA: Diagnosis not present

## 2019-01-17 DIAGNOSIS — E119 Type 2 diabetes mellitus without complications: Secondary | ICD-10-CM | POA: Diagnosis not present

## 2019-01-17 DIAGNOSIS — Z1389 Encounter for screening for other disorder: Secondary | ICD-10-CM | POA: Diagnosis not present

## 2019-01-17 DIAGNOSIS — I6389 Other cerebral infarction: Secondary | ICD-10-CM | POA: Diagnosis not present

## 2019-01-17 DIAGNOSIS — Z6837 Body mass index (BMI) 37.0-37.9, adult: Secondary | ICD-10-CM | POA: Diagnosis not present

## 2019-01-17 DIAGNOSIS — F331 Major depressive disorder, recurrent, moderate: Secondary | ICD-10-CM | POA: Diagnosis not present

## 2019-01-19 DIAGNOSIS — R0689 Other abnormalities of breathing: Secondary | ICD-10-CM | POA: Diagnosis not present

## 2019-01-19 DIAGNOSIS — Z79899 Other long term (current) drug therapy: Secondary | ICD-10-CM | POA: Diagnosis not present

## 2019-01-19 DIAGNOSIS — I10 Essential (primary) hypertension: Secondary | ICD-10-CM | POA: Diagnosis not present

## 2019-01-19 DIAGNOSIS — S0993XA Unspecified injury of face, initial encounter: Secondary | ICD-10-CM | POA: Diagnosis not present

## 2019-01-19 DIAGNOSIS — S0990XA Unspecified injury of head, initial encounter: Secondary | ICD-10-CM | POA: Diagnosis not present

## 2019-01-19 DIAGNOSIS — Z794 Long term (current) use of insulin: Secondary | ICD-10-CM | POA: Diagnosis not present

## 2019-01-19 DIAGNOSIS — S199XXA Unspecified injury of neck, initial encounter: Secondary | ICD-10-CM | POA: Diagnosis not present

## 2019-01-19 DIAGNOSIS — S299XXA Unspecified injury of thorax, initial encounter: Secondary | ICD-10-CM | POA: Diagnosis not present

## 2019-01-19 DIAGNOSIS — S3992XA Unspecified injury of lower back, initial encounter: Secondary | ICD-10-CM | POA: Diagnosis not present

## 2019-01-19 DIAGNOSIS — M5489 Other dorsalgia: Secondary | ICD-10-CM | POA: Diagnosis not present

## 2019-01-19 DIAGNOSIS — R51 Headache: Secondary | ICD-10-CM | POA: Diagnosis not present

## 2019-01-19 DIAGNOSIS — S0083XA Contusion of other part of head, initial encounter: Secondary | ICD-10-CM | POA: Diagnosis not present

## 2019-01-19 DIAGNOSIS — W01198A Fall on same level from slipping, tripping and stumbling with subsequent striking against other object, initial encounter: Secondary | ICD-10-CM | POA: Diagnosis not present

## 2019-01-19 DIAGNOSIS — R0902 Hypoxemia: Secondary | ICD-10-CM | POA: Diagnosis not present

## 2019-01-19 DIAGNOSIS — R42 Dizziness and giddiness: Secondary | ICD-10-CM | POA: Diagnosis not present

## 2019-01-19 DIAGNOSIS — E119 Type 2 diabetes mellitus without complications: Secondary | ICD-10-CM | POA: Diagnosis not present

## 2019-01-19 DIAGNOSIS — I69354 Hemiplegia and hemiparesis following cerebral infarction affecting left non-dominant side: Secondary | ICD-10-CM | POA: Diagnosis not present

## 2019-01-19 DIAGNOSIS — S300XXA Contusion of lower back and pelvis, initial encounter: Secondary | ICD-10-CM | POA: Diagnosis not present

## 2019-01-19 DIAGNOSIS — R52 Pain, unspecified: Secondary | ICD-10-CM | POA: Diagnosis not present

## 2019-02-12 DIAGNOSIS — E113293 Type 2 diabetes mellitus with mild nonproliferative diabetic retinopathy without macular edema, bilateral: Secondary | ICD-10-CM | POA: Diagnosis not present

## 2019-02-12 DIAGNOSIS — Z7689 Persons encountering health services in other specified circumstances: Secondary | ICD-10-CM | POA: Diagnosis not present

## 2019-02-16 DIAGNOSIS — E1142 Type 2 diabetes mellitus with diabetic polyneuropathy: Secondary | ICD-10-CM | POA: Diagnosis not present

## 2019-04-17 DIAGNOSIS — I6389 Other cerebral infarction: Secondary | ICD-10-CM | POA: Diagnosis not present

## 2019-04-17 DIAGNOSIS — F331 Major depressive disorder, recurrent, moderate: Secondary | ICD-10-CM | POA: Diagnosis not present

## 2019-04-17 DIAGNOSIS — E1143 Type 2 diabetes mellitus with diabetic autonomic (poly)neuropathy: Secondary | ICD-10-CM | POA: Diagnosis not present

## 2019-04-17 DIAGNOSIS — I10 Essential (primary) hypertension: Secondary | ICD-10-CM | POA: Diagnosis not present

## 2019-04-17 DIAGNOSIS — Z6837 Body mass index (BMI) 37.0-37.9, adult: Secondary | ICD-10-CM | POA: Diagnosis not present

## 2019-04-17 DIAGNOSIS — E1149 Type 2 diabetes mellitus with other diabetic neurological complication: Secondary | ICD-10-CM | POA: Diagnosis not present

## 2019-05-02 DIAGNOSIS — Z7689 Persons encountering health services in other specified circumstances: Secondary | ICD-10-CM | POA: Diagnosis not present

## 2019-05-02 DIAGNOSIS — N952 Postmenopausal atrophic vaginitis: Secondary | ICD-10-CM | POA: Diagnosis not present

## 2019-05-02 DIAGNOSIS — Z780 Asymptomatic menopausal state: Secondary | ICD-10-CM | POA: Diagnosis not present

## 2019-05-02 DIAGNOSIS — Z01419 Encounter for gynecological examination (general) (routine) without abnormal findings: Secondary | ICD-10-CM | POA: Diagnosis not present

## 2019-07-05 DIAGNOSIS — Z7689 Persons encountering health services in other specified circumstances: Secondary | ICD-10-CM | POA: Diagnosis not present

## 2019-07-05 DIAGNOSIS — E11319 Type 2 diabetes mellitus with unspecified diabetic retinopathy without macular edema: Secondary | ICD-10-CM | POA: Diagnosis not present

## 2019-07-18 DIAGNOSIS — Z7689 Persons encountering health services in other specified circumstances: Secondary | ICD-10-CM | POA: Diagnosis not present

## 2019-07-18 DIAGNOSIS — M25562 Pain in left knee: Secondary | ICD-10-CM | POA: Diagnosis not present

## 2019-07-18 DIAGNOSIS — I6389 Other cerebral infarction: Secondary | ICD-10-CM | POA: Diagnosis not present

## 2019-07-18 DIAGNOSIS — M1712 Unilateral primary osteoarthritis, left knee: Secondary | ICD-10-CM | POA: Diagnosis not present

## 2019-07-18 DIAGNOSIS — F331 Major depressive disorder, recurrent, moderate: Secondary | ICD-10-CM | POA: Diagnosis not present

## 2019-07-18 DIAGNOSIS — Z6839 Body mass index (BMI) 39.0-39.9, adult: Secondary | ICD-10-CM | POA: Diagnosis not present

## 2019-07-18 DIAGNOSIS — E1143 Type 2 diabetes mellitus with diabetic autonomic (poly)neuropathy: Secondary | ICD-10-CM | POA: Diagnosis not present

## 2019-07-18 DIAGNOSIS — I10 Essential (primary) hypertension: Secondary | ICD-10-CM | POA: Diagnosis not present

## 2019-08-09 ENCOUNTER — Other Ambulatory Visit: Payer: Self-pay

## 2019-08-09 ENCOUNTER — Ambulatory Visit: Payer: Medicare HMO | Admitting: Orthopaedic Surgery

## 2019-11-01 ENCOUNTER — Encounter: Payer: Self-pay | Admitting: Cardiology

## 2019-11-01 DIAGNOSIS — Z6839 Body mass index (BMI) 39.0-39.9, adult: Secondary | ICD-10-CM | POA: Diagnosis not present

## 2019-11-01 DIAGNOSIS — I6389 Other cerebral infarction: Secondary | ICD-10-CM | POA: Diagnosis not present

## 2019-11-01 DIAGNOSIS — I10 Essential (primary) hypertension: Secondary | ICD-10-CM | POA: Diagnosis not present

## 2019-11-01 DIAGNOSIS — E1143 Type 2 diabetes mellitus with diabetic autonomic (poly)neuropathy: Secondary | ICD-10-CM | POA: Diagnosis not present

## 2019-11-01 DIAGNOSIS — F331 Major depressive disorder, recurrent, moderate: Secondary | ICD-10-CM | POA: Diagnosis not present

## 2019-11-19 DIAGNOSIS — F17210 Nicotine dependence, cigarettes, uncomplicated: Secondary | ICD-10-CM | POA: Diagnosis not present

## 2019-11-19 DIAGNOSIS — M542 Cervicalgia: Secondary | ICD-10-CM | POA: Diagnosis not present

## 2019-11-19 DIAGNOSIS — M545 Low back pain: Secondary | ICD-10-CM | POA: Diagnosis not present

## 2019-11-19 DIAGNOSIS — M25562 Pain in left knee: Secondary | ICD-10-CM | POA: Diagnosis not present

## 2019-11-19 DIAGNOSIS — Z79899 Other long term (current) drug therapy: Secondary | ICD-10-CM | POA: Diagnosis not present

## 2019-12-12 DIAGNOSIS — R05 Cough: Secondary | ICD-10-CM | POA: Diagnosis not present

## 2019-12-12 DIAGNOSIS — R0602 Shortness of breath: Secondary | ICD-10-CM | POA: Diagnosis not present

## 2019-12-12 DIAGNOSIS — U071 COVID-19: Secondary | ICD-10-CM | POA: Diagnosis not present

## 2019-12-19 ENCOUNTER — Encounter: Payer: Self-pay | Admitting: *Deleted

## 2019-12-20 ENCOUNTER — Ambulatory Visit (INDEPENDENT_AMBULATORY_CARE_PROVIDER_SITE_OTHER): Payer: Medicare HMO | Admitting: Cardiology

## 2019-12-20 ENCOUNTER — Encounter: Payer: Self-pay | Admitting: Cardiology

## 2019-12-20 ENCOUNTER — Other Ambulatory Visit: Payer: Self-pay

## 2019-12-20 ENCOUNTER — Encounter: Payer: Self-pay | Admitting: *Deleted

## 2019-12-20 VITALS — BP 188/90 | HR 53 | Ht 69.0 in | Wt 268.6 lb

## 2019-12-20 DIAGNOSIS — E782 Mixed hyperlipidemia: Secondary | ICD-10-CM

## 2019-12-20 DIAGNOSIS — I10 Essential (primary) hypertension: Secondary | ICD-10-CM | POA: Diagnosis not present

## 2019-12-20 DIAGNOSIS — R001 Bradycardia, unspecified: Secondary | ICD-10-CM | POA: Diagnosis not present

## 2019-12-20 DIAGNOSIS — I38 Endocarditis, valve unspecified: Secondary | ICD-10-CM | POA: Diagnosis not present

## 2019-12-20 DIAGNOSIS — I5032 Chronic diastolic (congestive) heart failure: Secondary | ICD-10-CM | POA: Diagnosis not present

## 2019-12-20 MED ORDER — ROSUVASTATIN CALCIUM 40 MG PO TABS: 40.0000 mg | ORAL_TABLET | Freq: Every day | ORAL | 1 refills | Status: DC

## 2019-12-20 MED ORDER — CHLORTHALIDONE 25 MG PO TABS: 25.0000 mg | ORAL_TABLET | Freq: Every day | ORAL | 1 refills | Status: DC

## 2019-12-20 NOTE — Progress Notes (Signed)
Clinical Summary Amber Harris is a 59 y.o.female seen today as a new patient, last seen 05/2016 in our office.   1. Valvular heart disease  - mild AI and MR noted on prior echo 12/2009 and 10/2013   - 04/2018 echo [facility]: LVEF 60-65%, mild AI, no MR - no recent symptoms    2. HTN   - home bp's 160s/70s  3. Hyperlipidemia  10/2019 TC 202 TG 76 HDL 65 LDL 123 - compliant with statin   4. Chronic diastolic HF - no recent symptoms  5. History of CVA - admitted [facility] about 1 year ago - in hospital for about 1 week per her report - residual left sided weakness   6. Bradycardia - can have some lightheadedness at times, fairly infrequent.   7. Palpitations - has been on verapamil for very long time - no recent symptoms    Past Medical History:  Diagnosis Date  . Aortic valve disease    Long-standing systolic murmur  . Asthma    Uses p.r.n. albuterol  . Bronchitis   . COPD (chronic obstructive pulmonary disease) ([place])   . Degenerative joint disease    right knee  . Depression   . Depression with anxiety   . Diabetes mellitus   . Dyspnea on exertion    poor exercise tolerance  . Fibroids    uterine; postmenopausal bleeding  . Headache(784.0)   . Hyperlipidemia   . Hypertension   . Obesity   . Palpitations   . Syncope    exertional  . Tobacco user    30 pack years; 04/2011: 1/4 pack per day during quick attempt     Allergies  Allergen Reactions  . Aspirin Hives  . Penicillins Rash     Current Outpatient Medications  Medication Sig Dispense Refill  . albuterol (PROVENTIL) (5 MG/ML) 0.5% nebulizer solution Take 2.5 mg by nebulization 3 (three) times daily as needed for wheezing or shortness of breath.     Marland Kitchen albuterol (PROVENTIL) 90 MCG/ACT inhaler Inhale 2 puffs into the lungs 4 (four) times daily. Asthmatic Symptoms    . citalopram (CELEXA) 20 MG tablet Take 20 mg by mouth daily.    . cloNIDine (CATAPRES) 0.3 MG tablet Take 1 tab 3  times daily for 3 days then 1/2 tab 2 times daily for 3 days then stop 12 tablet 0  . estradiol (ESTRACE) 1 MG tablet Take 1 mg by mouth daily.    . furosemide (LASIX) 40 MG tablet Take 1 tab daily as needed for swelling 90 tablet 3  . gabapentin (NEURONTIN) 300 MG capsule Take 1 capsule (300 mg total) by mouth 3 (three) times daily. 90 capsule 5  . hydrALAZINE (APRESOLINE) 50 MG tablet Take 1 tablet (50 mg total) by mouth 3 (three) times daily. 270 tablet 3  . hydrochlorothiazide (HYDRODIURIL) 25 MG tablet Take 1 tablet (25 mg total) by mouth daily. 90 tablet 3  . Insulin Glargine (LANTUS) 100 UNIT/ML Solostar Pen Inject 25 Units into the skin at bedtime.     Marland Kitchen JARDIANCE 25 MG TABS tablet Take 1 tablet by mouth daily.    Marland Kitchen lisinopril (PRINIVIL,ZESTRIL) 20 MG tablet Take 20 mg by mouth 2 (two) times daily.    . mirtazapine (REMERON) 15 MG tablet Take 15 mg by mouth at bedtime.    . mometasone-formoterol (DULERA) 100-5 MCG/ACT AERO Inhale 2 puffs into the lungs 2 (two) times daily.    Marland Kitchen omeprazole (PRILOSEC) 20  MG capsule Take 2 capsules by mouth daily.  1  . risperiDONE (RISPERDAL) 0.5 MG tablet Take 0.5 mg by mouth daily at 12 noon.     . rosuvastatin (CRESTOR) 20 MG tablet Take 20 mg by mouth at bedtime.     . sitaGLIPtin-metformin (JANUMET) 50-1000 MG per tablet Take 1 tablet by mouth 2 (two) times daily with a meal.    . verapamil (CALAN) 80 MG tablet Take 2 tablets (160 mg total) by mouth 2 (two) times daily. 60 tablet 6   No current facility-administered medications for this visit.     Past Surgical History:  Procedure Laterality Date  . ABDOMINAL HYSTERECTOMY  12/21/2011   Procedure: HYSTERECTOMY ABDOMINAL;  Surgeon: Jonnie Kind, MD;  Location: [facility];  Service: Gynecology;  Laterality: N/A;  . TUBAL LIGATION       Allergies  Allergen Reactions  . Aspirin Hives  . Penicillins Rash      Family History  Problem Relation Age of Onset  . Lung cancer Mother   . Heart  disease Brother   . Breast cancer Sister   . Heart disease Maternal Aunt   . Cancer Cousin        lung  . Anesthesia problems Neg Hx   . Hypotension Neg Hx   . Malignant hyperthermia Neg Hx   . Pseudochol deficiency Neg Hx      Social History Amber Harris reports that she quit smoking about 4 years ago. Her smoking use included cigarettes. She started smoking about 45 years ago. She has a 15.00 pack-year smoking history. She has never used smokeless tobacco. Amber Harris reports no history of alcohol use.   Review of Systems CONSTITUTIONAL: No weight loss, fever, chills, weakness or fatigue.  HEENT: Eyes: No visual loss, blurred vision, double vision or yellow sclerae.No hearing loss, sneezing, congestion, runny nose or sore throat.  SKIN: No rash or itching.  CARDIOVASCULAR: per hpi RESPIRATORY: No shortness of breath, cough or sputum.  GASTROINTESTINAL: No anorexia, nausea, vomiting or diarrhea. No abdominal pain or blood.  GENITOURINARY: No burning on urination, no polyuria NEUROLOGICAL: No headache, dizziness, syncope, paralysis, ataxia, numbness or tingling in the extremities. No change in bowel or bladder control.  MUSCULOSKELETAL: No muscle, back pain, joint pain or stiffness.  LYMPHATICS: No enlarged nodes. No history of splenectomy.  PSYCHIATRIC: No history of depression or anxiety.  ENDOCRINOLOGIC: No reports of sweating, cold or heat intolerance. No polyuria or polydipsia.  Marland Kitchen   Physical Examination Today's Vitals   12/20/19 1029  BP: (!) 188/90  Pulse: (!) 53  SpO2: 100%  Weight: 268 lb 9.6 oz (121.8 kg)  Height: 5\' 9"  (1.753 m)   Body mass index is 39.67 kg/m.  Gen: resting comfortably, no acute distress HEENT: no scleral icterus, pupils equal round and reactive, no palptable cervical adenopathy,  CV: RRR, no m/r/g no jvd Resp: Clear to auscultation bilaterally GI: abdomen is soft, non-tender, non-distended, normal bowel sounds, no hepatosplenomegaly MSK:  extremities are warm, no edema.  Skin: warm, no rash Neuro:  no focal deficits Psych: appropriate affect   Diagnostic Studies 10/2012 Event monitor: no arrhythmias   08/2010 Echo:  Left ventricle: The cavity size was normal. Wall thickness was  normal. Systolic function was normal. The estimated ejection  fraction was in the range of 60% to 65%. Wall motion was normal;  there were no regional wall motion abnormalities. The study is not  technically sufficient to allow evaluation of LV diastolic  function.  - Aortic valve: Mildly calcified annulus. Mild regurgitation.  - Mitral valve: Mild regurgitation.  - Tricuspid valve: Mild regurgitation.  - Pericardium, extracardiac: There was no pericardial effusion.   11/14/13 Clinic EKG  Sinus rhythm   10/2013 Echo  LVEF 60-65%, grade II diastolic dysfunction, mild AI, mild MR   01/23/14 Clinic EKG  NSR, LAE, no ischemic changes    Assessment and Plan   1. Valvular heart disease  - echo as reported above, mild valvular disease - continue to monitor.   2. HTN  - bp above goal. Change HCTZ to chlorhtalidone - could consider aldactone for resistant HTN at f/u and pending labs - she is to update [place] in 2 weeks with bp's  3. Hyperlipidemia  - given prior CVA goal LDL would be <70 - increase crestor to 40mg  daily.   4. Chronic diastolic heart failure  -euvolemic without symptoms, continue current meds  5. Bradycardia - no significant symptoms. Has been on verapamil for palpitations and HTN for long time, also on clonidine which may be related.  - may wean verapamil over time if can get bp better contorlled on other agents.   F/u 3 months     Arnoldo Lenis, M.D.

## 2019-12-20 NOTE — Patient Instructions (Signed)
Your physician recommends that you schedule a follow-up appointment in: [place] has recommended you make the following change in your medication:   STOP HCTZ  START CHLORTHALIDONE 25 MG DAILY   INCREASE CRESTOR 40 MG DAILY   Your physician recommends that you return for lab work 2 WEEK BMP/MG  CALL us IN 2 WEEKS WITH BLOOD PRESSURE READINGS  Thank you for choosing [facility]!!

## 2020-02-20 DIAGNOSIS — Z1331 Encounter for screening for depression: Secondary | ICD-10-CM | POA: Diagnosis not present

## 2020-02-20 DIAGNOSIS — M545 Low back pain: Secondary | ICD-10-CM | POA: Diagnosis not present

## 2020-02-20 DIAGNOSIS — I6389 Other cerebral infarction: Secondary | ICD-10-CM | POA: Diagnosis not present

## 2020-02-20 DIAGNOSIS — E1143 Type 2 diabetes mellitus with diabetic autonomic (poly)neuropathy: Secondary | ICD-10-CM | POA: Diagnosis not present

## 2020-02-20 DIAGNOSIS — Z6841 Body Mass Index (BMI) 40.0 and over, adult: Secondary | ICD-10-CM | POA: Diagnosis not present

## 2020-02-20 DIAGNOSIS — Z0000 Encounter for general adult medical examination without abnormal findings: Secondary | ICD-10-CM | POA: Diagnosis not present

## 2020-02-20 DIAGNOSIS — I10 Essential (primary) hypertension: Secondary | ICD-10-CM | POA: Diagnosis not present

## 2020-02-20 DIAGNOSIS — F331 Major depressive disorder, recurrent, moderate: Secondary | ICD-10-CM | POA: Diagnosis not present

## 2020-03-20 ENCOUNTER — Ambulatory Visit: Payer: Medicare HMO | Admitting: Cardiology

## 2020-03-20 NOTE — Progress Notes (Deleted)
Clinical Summary Ms. Pean is a 59 y.o.female seen today as a new patient, last seen 05/2016 in our office.   1. Valvular heart disease  - mild AI and MR noted on prior echo 12/2009 and 10/2013   - 04/2018 echo [facility]: LVEF 60-65%, mild AI, no MR - no recent symptoms    2. HTN   - home bp's 160s/70s  - last visit we changed HCTZ to chlorthalidone 25mg  daily   3. Hyperlipidemia  10/2019 TC 202 TG 76 HDL 65 LDL 123 - compliant with statin   4. Chronic diastolic HF - no recent symptoms  5. History of CVA - admitted [facility] about 1 year ago - in hospital for about 1 week per her report - residual left sided weakness   6. Bradycardia - can have some lightheadedness at times, fairly infrequent.   7. Palpitations - has been on verapamil for very long time - no recent symptoms    Past Medical History:  Diagnosis Date  . Aortic valve disease    Long-standing systolic murmur  . Asthma    Uses p.r.n. albuterol  . Bronchitis   . COPD (chronic obstructive pulmonary disease) ([place])   . Degenerative joint disease    right knee  . Depression   . Depression with anxiety   . Diabetes mellitus   . Dyspnea on exertion    poor exercise tolerance  . Fibroids    uterine; postmenopausal bleeding  . Headache(784.0)   . Hyperlipidemia   . Hypertension   . Obesity   . Palpitations   . Syncope    exertional  . Tobacco user    30 pack years; 04/2011: 1/4 pack per day during quick attempt     Allergies  Allergen Reactions  . Aspirin Hives  . Penicillins Rash     Current Outpatient Medications  Medication Sig Dispense Refill  . albuterol (PROVENTIL) (5 MG/ML) 0.5% nebulizer solution Take 2.5 mg by nebulization 3 (three) times daily as needed for wheezing or shortness of breath.     Marland Kitchen albuterol (PROVENTIL) 90 MCG/ACT inhaler Inhale 2 puffs into the lungs 4 (four) times daily. Asthmatic Symptoms    . aspirin EC 81 MG tablet Take 81 mg by  mouth daily.    . chlorthalidone (HYGROTON) 25 MG tablet Take 1 tablet (25 mg total) by mouth daily. 90 tablet 1  . citalopram (CELEXA) 20 MG tablet Take 20 mg by mouth daily.    . cloNIDine (CATAPRES) 0.3 MG tablet Take 0.3 mg by mouth daily.    Marland Kitchen estradiol (ESTRACE) 1 MG tablet Take 1 mg by mouth daily.    . furosemide (LASIX) 40 MG tablet Take 1 tab daily as needed for swelling 90 tablet 3  . gabapentin (NEURONTIN) 100 MG capsule Take 100 mg by mouth 3 (three) times daily.    . Insulin Glargine (LANTUS) 100 UNIT/ML Solostar Pen Inject 25 Units into the skin at bedtime.     Marland Kitchen JARDIANCE 25 MG TABS tablet Take 1 tablet by mouth daily.    Marland Kitchen lisinopril (PRINIVIL,ZESTRIL) 20 MG tablet Take 20 mg by mouth 2 (two) times daily.    . mirtazapine (REMERON) 15 MG tablet Take 15 mg by mouth at bedtime.    . mometasone-formoterol (DULERA) 100-5 MCG/ACT AERO Inhale 2 puffs into the lungs 2 (two) times daily.    Marland Kitchen omeprazole (PRILOSEC) 20 MG capsule Take 2 capsules by mouth daily.  1  . risperiDONE (RISPERDAL)  0.5 MG tablet Take 0.5 mg by mouth daily at 12 noon.     . rosuvastatin (CRESTOR) 40 MG tablet Take 1 tablet (40 mg total) by mouth daily. 90 tablet 1  . sitaGLIPtin-metformin (JANUMET) 50-1000 MG per tablet Take 1 tablet by mouth 2 (two) times daily with a meal.    . verapamil (CALAN) 80 MG tablet Take 2 tablets (160 mg total) by mouth 2 (two) times daily. 60 tablet 6   No current facility-administered medications for this visit.     Past Surgical History:  Procedure Laterality Date  . ABDOMINAL HYSTERECTOMY  12/21/2011   Procedure: HYSTERECTOMY ABDOMINAL;  Surgeon: Jonnie Kind, MD;  Location: [facility];  Service: Gynecology;  Laterality: N/A;  . TUBAL LIGATION       Allergies  Allergen Reactions  . Aspirin Hives  . Penicillins Rash      Family History  Problem Relation Age of Onset  . Lung cancer Mother   . Heart disease Brother   . Breast cancer Sister   . Heart disease  Maternal Aunt   . Cancer Cousin        lung  . Anesthesia problems Neg Hx   . Hypotension Neg Hx   . Malignant hyperthermia Neg Hx   . Pseudochol deficiency Neg Hx      Social History Ms. Loja reports that she quit smoking about 4 years ago. Her smoking use included cigarettes. She started smoking about 45 years ago. She has a 15.00 pack-year smoking history. She has never used smokeless tobacco. Ms. Dipasquale reports no history of alcohol use.   Review of Systems CONSTITUTIONAL: No weight loss, fever, chills, weakness or fatigue.  HEENT: Eyes: No visual loss, blurred vision, double vision or yellow sclerae.No hearing loss, sneezing, congestion, runny nose or sore throat.  SKIN: No rash or itching.  CARDIOVASCULAR:  RESPIRATORY: No shortness of breath, cough or sputum.  GASTROINTESTINAL: No anorexia, nausea, vomiting or diarrhea. No abdominal pain or blood.  GENITOURINARY: No burning on urination, no polyuria NEUROLOGICAL: No headache, dizziness, syncope, paralysis, ataxia, numbness or tingling in the extremities. No change in bowel or bladder control.  MUSCULOSKELETAL: No muscle, back pain, joint pain or stiffness.  LYMPHATICS: No enlarged nodes. No history of splenectomy.  PSYCHIATRIC: No history of depression or anxiety.  ENDOCRINOLOGIC: No reports of sweating, cold or heat intolerance. No polyuria or polydipsia.  Marland Kitchen   Physical Examination There were no vitals filed for this visit. There were no vitals filed for this visit.  Gen: resting comfortably, no acute distress HEENT: no scleral icterus, pupils equal round and reactive, no palptable cervical adenopathy,  CV Resp: Clear to auscultation bilaterally GI: abdomen is soft, non-tender, non-distended, normal bowel sounds, no hepatosplenomegaly MSK: extremities are warm, no edema.  Skin: warm, no rash Neuro:  no focal deficits Psych: appropriate affect   Diagnostic Studies 10/2012 Event monitor: no arrhythmias    08/2010 Echo:  Left ventricle: The cavity size was normal. Wall thickness was  normal. Systolic function was normal. The estimated ejection  fraction was in the range of 60% to 65%. Wall motion was normal;  there were no regional wall motion abnormalities. The study is not  technically sufficient to allow evaluation of LV diastolic  function.  - Aortic valve: Mildly calcified annulus. Mild regurgitation.  - Mitral valve: Mild regurgitation.  - Tricuspid valve: Mild regurgitation.  - Pericardium, extracardiac: There was no pericardial effusion.   11/14/13 Clinic EKG Sinus rhythm   10/2013  Echo LVEF 60-65%, grade II diastolic dysfunction, mild AI, mild MR   01/23/14 Clinic EKG NSR, LAE, no ischemic changes     Assessment and Plan  1. Valvular heart disease  - echo as reported above, mild valvular disease - continue to monitor.   2. HTN  - bp above goal. Change HCTZ to chlorhtalidone - could consider aldactone for resistant HTN at f/u and pending labs - she is to update [place] in 2 weeks with bp's  3. Hyperlipidemia  - given prior CVA goal LDL would be <70 - increase crestor to 40mg  daily.   4. Chronic diastolic heart failure  -euvolemic without symptoms, continue current meds  5. Bradycardia - no significant symptoms. Has been on verapamil for palpitations and HTN for long time, also on clonidine which may be related.  - may wean verapamil over time if can get bp better contorlled on other agents.   F/u 3 months      Arnoldo Lenis, M.D.

## 2020-04-17 DIAGNOSIS — Z6838 Body mass index (BMI) 38.0-38.9, adult: Secondary | ICD-10-CM | POA: Diagnosis not present

## 2020-04-17 DIAGNOSIS — G43901 Migraine, unspecified, not intractable, with status migrainosus: Secondary | ICD-10-CM | POA: Diagnosis not present

## 2020-04-23 ENCOUNTER — Other Ambulatory Visit: Payer: Self-pay | Admitting: Cardiology

## 2020-05-13 DIAGNOSIS — G43901 Migraine, unspecified, not intractable, with status migrainosus: Secondary | ICD-10-CM | POA: Diagnosis not present

## 2020-05-13 DIAGNOSIS — H5203 Hypermetropia, bilateral: Secondary | ICD-10-CM | POA: Diagnosis not present

## 2020-05-13 DIAGNOSIS — E119 Type 2 diabetes mellitus without complications: Secondary | ICD-10-CM | POA: Diagnosis not present

## 2020-05-13 DIAGNOSIS — Z794 Long term (current) use of insulin: Secondary | ICD-10-CM | POA: Diagnosis not present

## 2020-05-13 DIAGNOSIS — H2513 Age-related nuclear cataract, bilateral: Secondary | ICD-10-CM | POA: Diagnosis not present

## 2020-05-13 DIAGNOSIS — E1143 Type 2 diabetes mellitus with diabetic autonomic (poly)neuropathy: Secondary | ICD-10-CM | POA: Diagnosis not present

## 2020-05-13 DIAGNOSIS — Z8673 Personal history of transient ischemic attack (TIA), and cerebral infarction without residual deficits: Secondary | ICD-10-CM | POA: Diagnosis not present

## 2020-05-13 DIAGNOSIS — H524 Presbyopia: Secondary | ICD-10-CM | POA: Diagnosis not present

## 2020-05-13 DIAGNOSIS — I10 Essential (primary) hypertension: Secondary | ICD-10-CM | POA: Diagnosis not present

## 2020-05-13 DIAGNOSIS — Z7984 Long term (current) use of oral hypoglycemic drugs: Secondary | ICD-10-CM | POA: Diagnosis not present

## 2020-05-13 DIAGNOSIS — Z6838 Body mass index (BMI) 38.0-38.9, adult: Secondary | ICD-10-CM | POA: Diagnosis not present

## 2020-05-13 DIAGNOSIS — H52223 Regular astigmatism, bilateral: Secondary | ICD-10-CM | POA: Diagnosis not present

## 2020-05-18 DIAGNOSIS — Z79899 Other long term (current) drug therapy: Secondary | ICD-10-CM | POA: Diagnosis not present

## 2020-05-18 DIAGNOSIS — I10 Essential (primary) hypertension: Secondary | ICD-10-CM | POA: Diagnosis not present

## 2020-05-18 DIAGNOSIS — R599 Enlarged lymph nodes, unspecified: Secondary | ICD-10-CM | POA: Diagnosis not present

## 2020-05-18 DIAGNOSIS — Z880 Allergy status to penicillin: Secondary | ICD-10-CM | POA: Diagnosis not present

## 2020-05-18 DIAGNOSIS — Z7984 Long term (current) use of oral hypoglycemic drugs: Secondary | ICD-10-CM | POA: Diagnosis not present

## 2020-05-18 DIAGNOSIS — J449 Chronic obstructive pulmonary disease, unspecified: Secondary | ICD-10-CM | POA: Diagnosis not present

## 2020-05-18 DIAGNOSIS — Z794 Long term (current) use of insulin: Secondary | ICD-10-CM | POA: Diagnosis not present

## 2020-05-18 DIAGNOSIS — E119 Type 2 diabetes mellitus without complications: Secondary | ICD-10-CM | POA: Diagnosis not present

## 2020-05-18 DIAGNOSIS — R1310 Dysphagia, unspecified: Secondary | ICD-10-CM | POA: Diagnosis not present

## 2020-05-18 DIAGNOSIS — E785 Hyperlipidemia, unspecified: Secondary | ICD-10-CM | POA: Diagnosis not present

## 2020-05-18 DIAGNOSIS — R590 Localized enlarged lymph nodes: Secondary | ICD-10-CM | POA: Diagnosis not present

## 2020-05-18 DIAGNOSIS — J351 Hypertrophy of tonsils: Secondary | ICD-10-CM | POA: Diagnosis not present

## 2020-05-18 DIAGNOSIS — I252 Old myocardial infarction: Secondary | ICD-10-CM | POA: Diagnosis not present

## 2020-05-18 DIAGNOSIS — J020 Streptococcal pharyngitis: Secondary | ICD-10-CM | POA: Diagnosis not present

## 2020-05-18 DIAGNOSIS — Z7902 Long term (current) use of antithrombotics/antiplatelets: Secondary | ICD-10-CM | POA: Diagnosis not present

## 2020-05-18 DIAGNOSIS — F17210 Nicotine dependence, cigarettes, uncomplicated: Secondary | ICD-10-CM | POA: Diagnosis not present

## 2020-05-18 DIAGNOSIS — F209 Schizophrenia, unspecified: Secondary | ICD-10-CM | POA: Diagnosis not present

## 2020-05-18 DIAGNOSIS — Z7982 Long term (current) use of aspirin: Secondary | ICD-10-CM | POA: Diagnosis not present

## 2020-05-18 DIAGNOSIS — J352 Hypertrophy of adenoids: Secondary | ICD-10-CM | POA: Diagnosis not present

## 2020-05-18 DIAGNOSIS — J0510 Acute epiglottitis without obstruction: Secondary | ICD-10-CM | POA: Diagnosis not present

## 2020-05-18 DIAGNOSIS — Z886 Allergy status to analgesic agent status: Secondary | ICD-10-CM | POA: Diagnosis not present

## 2020-05-22 DIAGNOSIS — J029 Acute pharyngitis, unspecified: Secondary | ICD-10-CM | POA: Diagnosis not present

## 2020-05-22 DIAGNOSIS — Z6838 Body mass index (BMI) 38.0-38.9, adult: Secondary | ICD-10-CM | POA: Diagnosis not present

## 2020-05-26 DIAGNOSIS — I351 Nonrheumatic aortic (valve) insufficiency: Secondary | ICD-10-CM | POA: Diagnosis not present

## 2020-05-26 DIAGNOSIS — I639 Cerebral infarction, unspecified: Secondary | ICD-10-CM | POA: Diagnosis not present

## 2020-05-26 DIAGNOSIS — I6782 Cerebral ischemia: Secondary | ICD-10-CM | POA: Diagnosis not present

## 2020-05-26 DIAGNOSIS — J90 Pleural effusion, not elsewhere classified: Secondary | ICD-10-CM | POA: Diagnosis not present

## 2020-05-26 DIAGNOSIS — Q211 Atrial septal defect: Secondary | ICD-10-CM | POA: Diagnosis not present

## 2020-05-26 DIAGNOSIS — N179 Acute kidney failure, unspecified: Secondary | ICD-10-CM | POA: Diagnosis not present

## 2020-05-26 DIAGNOSIS — I6381 Other cerebral infarction due to occlusion or stenosis of small artery: Secondary | ICD-10-CM | POA: Diagnosis not present

## 2020-05-26 DIAGNOSIS — E785 Hyperlipidemia, unspecified: Secondary | ICD-10-CM | POA: Diagnosis not present

## 2020-05-26 DIAGNOSIS — R4020 Unspecified coma: Secondary | ICD-10-CM | POA: Diagnosis not present

## 2020-05-26 DIAGNOSIS — I6523 Occlusion and stenosis of bilateral carotid arteries: Secondary | ICD-10-CM | POA: Diagnosis not present

## 2020-05-26 DIAGNOSIS — Z794 Long term (current) use of insulin: Secondary | ICD-10-CM | POA: Diagnosis not present

## 2020-05-26 DIAGNOSIS — Z7902 Long term (current) use of antithrombotics/antiplatelets: Secondary | ICD-10-CM | POA: Diagnosis not present

## 2020-05-26 DIAGNOSIS — J449 Chronic obstructive pulmonary disease, unspecified: Secondary | ICD-10-CM | POA: Diagnosis not present

## 2020-05-26 DIAGNOSIS — R001 Bradycardia, unspecified: Secondary | ICD-10-CM | POA: Diagnosis not present

## 2020-05-26 DIAGNOSIS — E119 Type 2 diabetes mellitus without complications: Secondary | ICD-10-CM | POA: Diagnosis not present

## 2020-05-26 DIAGNOSIS — I10 Essential (primary) hypertension: Secondary | ICD-10-CM | POA: Diagnosis not present

## 2020-05-26 DIAGNOSIS — F418 Other specified anxiety disorders: Secondary | ICD-10-CM | POA: Diagnosis not present

## 2020-05-26 DIAGNOSIS — R404 Transient alteration of awareness: Secondary | ICD-10-CM | POA: Diagnosis not present

## 2020-05-26 DIAGNOSIS — R410 Disorientation, unspecified: Secondary | ICD-10-CM | POA: Diagnosis not present

## 2020-05-26 DIAGNOSIS — F209 Schizophrenia, unspecified: Secondary | ICD-10-CM | POA: Diagnosis not present

## 2020-05-26 DIAGNOSIS — I517 Cardiomegaly: Secondary | ICD-10-CM | POA: Diagnosis not present

## 2020-05-26 DIAGNOSIS — Z20822 Contact with and (suspected) exposure to covid-19: Secondary | ICD-10-CM | POA: Diagnosis not present

## 2020-05-26 DIAGNOSIS — F17200 Nicotine dependence, unspecified, uncomplicated: Secondary | ICD-10-CM | POA: Diagnosis not present

## 2020-05-26 DIAGNOSIS — R531 Weakness: Secondary | ICD-10-CM | POA: Diagnosis not present

## 2020-05-26 DIAGNOSIS — R4781 Slurred speech: Secondary | ICD-10-CM | POA: Diagnosis not present

## 2020-05-26 DIAGNOSIS — M25519 Pain in unspecified shoulder: Secondary | ICD-10-CM | POA: Diagnosis not present

## 2020-05-26 DIAGNOSIS — R29818 Other symptoms and signs involving the nervous system: Secondary | ICD-10-CM | POA: Diagnosis not present

## 2020-05-26 DIAGNOSIS — R4182 Altered mental status, unspecified: Secondary | ICD-10-CM | POA: Diagnosis not present

## 2020-05-26 DIAGNOSIS — G459 Transient cerebral ischemic attack, unspecified: Secondary | ICD-10-CM | POA: Diagnosis not present

## 2020-05-26 DIAGNOSIS — I5189 Other ill-defined heart diseases: Secondary | ICD-10-CM | POA: Diagnosis not present

## 2020-06-10 DIAGNOSIS — Z6839 Body mass index (BMI) 39.0-39.9, adult: Secondary | ICD-10-CM | POA: Diagnosis not present

## 2020-06-10 DIAGNOSIS — I10 Essential (primary) hypertension: Secondary | ICD-10-CM | POA: Diagnosis not present

## 2020-06-10 DIAGNOSIS — E1165 Type 2 diabetes mellitus with hyperglycemia: Secondary | ICD-10-CM | POA: Diagnosis not present

## 2020-06-10 DIAGNOSIS — G458 Other transient cerebral ischemic attacks and related syndromes: Secondary | ICD-10-CM | POA: Diagnosis not present

## 2020-06-12 DIAGNOSIS — E119 Type 2 diabetes mellitus without complications: Secondary | ICD-10-CM | POA: Diagnosis not present

## 2020-06-12 DIAGNOSIS — I252 Old myocardial infarction: Secondary | ICD-10-CM | POA: Diagnosis not present

## 2020-06-12 DIAGNOSIS — Q211 Atrial septal defect: Secondary | ICD-10-CM | POA: Diagnosis not present

## 2020-06-12 DIAGNOSIS — I10 Essential (primary) hypertension: Secondary | ICD-10-CM | POA: Diagnosis not present

## 2020-06-12 DIAGNOSIS — J449 Chronic obstructive pulmonary disease, unspecified: Secondary | ICD-10-CM | POA: Diagnosis not present

## 2020-06-12 DIAGNOSIS — I359 Nonrheumatic aortic valve disorder, unspecified: Secondary | ICD-10-CM | POA: Diagnosis not present

## 2020-06-12 DIAGNOSIS — F418 Other specified anxiety disorders: Secondary | ICD-10-CM | POA: Diagnosis not present

## 2020-06-12 DIAGNOSIS — F17210 Nicotine dependence, cigarettes, uncomplicated: Secondary | ICD-10-CM | POA: Diagnosis not present

## 2020-06-12 DIAGNOSIS — F209 Schizophrenia, unspecified: Secondary | ICD-10-CM | POA: Diagnosis not present

## 2020-07-03 DIAGNOSIS — Z0389 Encounter for observation for other suspected diseases and conditions ruled out: Secondary | ICD-10-CM | POA: Diagnosis not present

## 2020-07-03 DIAGNOSIS — M810 Age-related osteoporosis without current pathological fracture: Secondary | ICD-10-CM | POA: Diagnosis not present

## 2020-07-07 DIAGNOSIS — Z1231 Encounter for screening mammogram for malignant neoplasm of breast: Secondary | ICD-10-CM | POA: Diagnosis not present

## 2020-08-18 DIAGNOSIS — H25813 Combined forms of age-related cataract, bilateral: Secondary | ICD-10-CM | POA: Diagnosis not present

## 2020-09-12 ENCOUNTER — Other Ambulatory Visit: Payer: Self-pay | Admitting: Cardiology

## 2020-09-17 DIAGNOSIS — Z0000 Encounter for general adult medical examination without abnormal findings: Secondary | ICD-10-CM | POA: Diagnosis not present

## 2020-09-17 DIAGNOSIS — L278 Dermatitis due to other substances taken internally: Secondary | ICD-10-CM | POA: Diagnosis not present

## 2020-09-17 DIAGNOSIS — Z6838 Body mass index (BMI) 38.0-38.9, adult: Secondary | ICD-10-CM | POA: Diagnosis not present

## 2020-09-17 DIAGNOSIS — E1165 Type 2 diabetes mellitus with hyperglycemia: Secondary | ICD-10-CM | POA: Diagnosis not present

## 2020-09-17 DIAGNOSIS — I10 Essential (primary) hypertension: Secondary | ICD-10-CM | POA: Diagnosis not present

## 2020-09-17 DIAGNOSIS — Z8673 Personal history of transient ischemic attack (TIA), and cerebral infarction without residual deficits: Secondary | ICD-10-CM | POA: Diagnosis not present

## 2020-09-23 NOTE — H&P (Signed)
Surgical History & Physical  Patient Name: Amber Harris DOB: [birth year]  Surgery: Cataract extraction with intraocular lens implant phacoemulsification; Left Eye  Surgeon: Baruch Goldmann MD Surgery Date:  10/03/2020 Pre-Op Date:  09/23/2020  HPI: A 77 Yr. old female patient is referred by Hassell Done for cataract eval. 1. The patient complains of difficulty when viewing TV, reading closed caption, news scrolls on TV, which began 1 year ago. Both eyes are affected. The episode is gradual. The condition's severity increased since last visit. Symptoms occur when the patient is inside, outside and reading. The complaint is associated with glare. These are negatively affecting her quality. Type 2 DM for more than 10 years, can not recall last A1c level. HPI was performed by Baruch Goldmann .  Medical History: Dry Eyes Cataracts Diabetes High Blood Pressure Lung Problems Hypercholesterolemia  Review of Systems Negative Allergic/Immunologic Negative Cardiovascular Negative Constitutional Negative Ear, Nose, Mouth & Throat Negative Endocrine Negative Eyes Negative Gastrointestinal Negative Genitourinary Negative Hemotologic/Lymphatic Negative Integumentary Negative Musculoskeletal Negative Neurological Negative Psychiatry Negative Respiratory  Social   Current every day smoker of Cigarettes 1 pack Per Day  Medication Crestor, Cymbalta, Janumet, Jardiance, Risperdal, Lantus, Tresiba, Clonidine, Furosemide, Gabapentin, Verapamil,   Sx/Procedures Hysterectomy,   Drug Allergies  Aspirin, Pencillin, Seasonal allergies,   History & Physical: Heent:  Cataract, Left eye NECK: supple without bruits LUNGS: lungs clear to auscultation CV: regular rate and rhythm Abdomen: soft and non-tender  Impression & Plan: Assessment: 1.  COMBINED FORMS AGE RELATED CATARACT; Both Eyes (H25.813)  Plan: 1.  Cataract accounts for the patient's decreased vision. This visual impairment is not  correctable with a tolerable change in glasses or contact lenses. Cataract surgery with an implantation of a new lens should significantly improve the visual and functional status of the patient. Discussed all risks, benefits, alternatives, and potential complications. Discussed the procedures and recovery. Patient desires to have surgery. A-scan ordered and performed today for intra-ocular lens calculations. The surgery will be performed in order to improve vision for driving, reading, and for eye examinations. Recommend phacoemulsification with intra-ocular lens. Recommend Dextenza for post-operative pain and inflammation. Left Eye worse - first. Dilates well - shugarcaine by protocol.

## 2020-09-28 DIAGNOSIS — I10 Essential (primary) hypertension: Secondary | ICD-10-CM | POA: Diagnosis not present

## 2020-09-28 DIAGNOSIS — Z720 Tobacco use: Secondary | ICD-10-CM | POA: Diagnosis not present

## 2020-09-28 DIAGNOSIS — J449 Chronic obstructive pulmonary disease, unspecified: Secondary | ICD-10-CM | POA: Diagnosis not present

## 2020-09-28 DIAGNOSIS — F17200 Nicotine dependence, unspecified, uncomplicated: Secondary | ICD-10-CM | POA: Diagnosis not present

## 2020-09-28 DIAGNOSIS — F32A Depression, unspecified: Secondary | ICD-10-CM | POA: Diagnosis not present

## 2020-09-28 DIAGNOSIS — L739 Follicular disorder, unspecified: Secondary | ICD-10-CM | POA: Diagnosis not present

## 2020-09-28 DIAGNOSIS — E119 Type 2 diabetes mellitus without complications: Secondary | ICD-10-CM | POA: Diagnosis not present

## 2020-09-29 DIAGNOSIS — H25812 Combined forms of age-related cataract, left eye: Secondary | ICD-10-CM | POA: Diagnosis not present

## 2020-09-30 NOTE — Patient Instructions (Signed)
Amber Harris  09/30/2020     @PREFPERIOPPHARMACY @   Your procedure is scheduled on  10/03/2020.  Report to [facility] at  0900  A.M.  Call this number if you have problems the morning of surgery:  [phone]   Remember:  Do not eat or drink after midnight.                        Take these medicines the morning of surgery with A SIP OF WATER  Celexa, cymbalta, gabapentin, prilosec, risperdol, topamax(if needed), verapamil. Use your inhalers and your nebulizer before you come and bring your rescue inhaler with you. DO not take any medications for diabetes the morning of your procedure.     Do not wear jewelry, make-up or nail polish.  Do not wear lotions, powders, or perfumes. Please wear deodorant and brush your teeth.  Do not shave 48 hours prior to surgery.  Men may shave face and neck.  Do not bring valuables to the hospital.  [facility] is not responsible for any belongings or valuables.  Contacts, dentures or bridgework may not be worn into surgery.  Leave your suitcase in the car.  After surgery it may be brought to your room.  For patients admitted to the hospital, discharge time will be determined by your treatment team.  Patients discharged the day of surgery will not be allowed to drive home.   Name and phone number of your driver:   family Special instructions:   DO NOT smoke the morning of your procedure.  Please read over the following fact sheets that you were given. Anesthesia Post-op Instructions and Care and Recovery After Surgery      Cataract Surgery, Care After This sheet gives you information about how to care for yourself after your procedure. Your health care provider may also give you more specific instructions. If you have problems or questions, contact your health care provider. What can I expect after the procedure? After the procedure, it is common to have:  Itching.  Discomfort.  Fluid discharge.  Sensitivity to light and to  touch.  Bruising in or around the eye.  Mild blurred vision. Follow these instructions at home: Eye care   Do not touch or rub your eyes.  Protect your eyes as told by your health care provider. You may be told to wear a protective eye shield or sunglasses.  Do not put a contact lens into the affected eye or eyes until your health care provider approves.  Keep the area around your eye clean and dry: ? Avoid swimming. ? Do not allow water to hit you directly in the face while showering. ? Keep soap and shampoo out of your eyes.  Check your eye every day for signs of infection. Watch for: ? Redness, swelling, or pain. ? Fluid, blood, or pus. ? Warmth. ? A bad smell. ? Vision that is getting worse. ? Sensitivity that is getting worse. Activity  Do not drive for 24 hours if you were given a sedative during your procedure.  Avoid strenuous activities, such as playing contact sports, for as long as told by your health care provider.  Do not drive or use heavy machinery until your health care provider approves.  Do not bend or lift heavy objects. Bending increases pressure in the eye. You can walk, climb stairs, and do light household chores.  Ask your health care provider when you can return to  work. If you work in a dusty environment, you may be advised to wear protective eyewear for a period of time. General instructions  Take or apply over-the-counter and prescription medicines only as told by your health care provider. This includes eye drops.  Keep all follow-up visits as told by your health care provider. This is important. Contact a health care provider if:  You have increased bruising around your eye.  You have pain that is not helped with medicine.  You have a fever.  You have redness, swelling, or pain in your eye.  You have fluid, blood, or pus coming from your incision.  Your vision gets worse.  Your sensitivity to light gets worse. Get help right away  if:  You have sudden loss of vision.  You see flashes of light or spots (floaters).  You have severe eye pain.  You develop nausea or vomiting. Summary  After your procedure, it is common to have itching, discomfort, bruising, fluid discharge, or sensitivity to light.  Follow instructions from your health care provider about caring for your eye after the procedure.  Do not rub your eye after the procedure. You may need to wear eye protection or sunglasses. Do not wear contact lenses. Keep the area around your eye clean and dry.  Avoid activities that require a lot of effort. These include playing sports and lifting heavy objects.  Contact a health care provider if you have increased bruising, pain that does not go away, or a fever. Get help right away if you suddenly lose your vision, see flashes of light or spots, or have severe pain in the eye. This information is not intended to replace advice given to you by your health care provider. Make sure you discuss any questions you have with your health care provider. Document Revised: 09/11/2019 Document Reviewed: 05/15/2018 Elsevier Patient Education  [place].

## 2020-10-01 ENCOUNTER — Other Ambulatory Visit: Payer: Self-pay

## 2020-10-01 ENCOUNTER — Encounter (HOSPITAL_COMMUNITY)
Admission: RE | Admit: 2020-10-01 | Discharge: 2020-10-01 | Disposition: A | Discharge status: Home / Self Care | Payer: Medicare HMO | Source: Ambulatory Visit | Attending: Ophthalmology | Admitting: Ophthalmology

## 2020-10-01 ENCOUNTER — Other Ambulatory Visit (HOSPITAL_COMMUNITY)
Admission: RE | Admit: 2020-10-01 | Discharge: 2020-10-01 | Disposition: A | Discharge status: Home / Self Care | Payer: Medicare HMO | Source: Ambulatory Visit | Attending: Ophthalmology | Admitting: Ophthalmology

## 2020-10-01 ENCOUNTER — Encounter (HOSPITAL_COMMUNITY): Payer: Self-pay

## 2020-10-01 DIAGNOSIS — Z20822 Contact with and (suspected) exposure to covid-19: Secondary | ICD-10-CM | POA: Insufficient documentation

## 2020-10-01 DIAGNOSIS — Z01812 Encounter for preprocedural laboratory examination: Secondary | ICD-10-CM | POA: Insufficient documentation

## 2020-10-01 HISTORY — DX: Acute myocardial infarction, unspecified: I21.9

## 2020-10-01 HISTORY — DX: Cerebral infarction, unspecified: I63.9

## 2020-10-01 HISTORY — DX: Gastro-esophageal reflux disease without esophagitis: K21.9

## 2020-10-01 LAB — SARS CORONAVIRUS 2 (TAT 6-24 HRS): SARS Coronavirus 2: NEGATIVE

## 2020-10-01 LAB — HEMOGLOBIN A1C
Hgb A1c MFr Bld: 6.5 % — ABNORMAL HIGH (ref 4.8–5.6)
Mean Plasma Glucose: 139.85 mg/dL

## 2020-10-01 LAB — BASIC METABOLIC PANEL
Anion gap: 8 (ref 5–15)
BUN: 13 mg/dL (ref 6–20)
CO2: 24 mmol/L (ref 22–32)
Calcium: 9.3 mg/dL (ref 8.9–10.3)
Chloride: 105 mmol/L (ref 98–111)
Creatinine, Ser: 0.82 mg/dL (ref 0.44–1.00)
GFR, Estimated: >60 mL/min (ref >60)
Glucose, Bld: 172 mg/dL — ABNORMAL HIGH (ref 70–99)
Potassium: 3.2 mmol/L — ABNORMAL LOW (ref 3.5–5.1)
Sodium: 137 mmol/L (ref 135–145)

## 2020-10-02 NOTE — Pre-Procedure Instructions (Signed)
[email or web address] routed to PCP

## 2020-10-03 ENCOUNTER — Ambulatory Visit (HOSPITAL_COMMUNITY): Payer: Medicare HMO

## 2020-10-03 ENCOUNTER — Ambulatory Visit (HOSPITAL_COMMUNITY)
Admission: RE | Admit: 2020-10-03 | Discharge: 2020-10-03 | Disposition: A | Discharge status: Home / Self Care | Payer: Medicare HMO | Attending: Ophthalmology | Admitting: Ophthalmology

## 2020-10-03 ENCOUNTER — Other Ambulatory Visit: Payer: Self-pay

## 2020-10-03 ENCOUNTER — Encounter (HOSPITAL_COMMUNITY)
Admission: RE | Disposition: A | Discharge status: Home / Self Care | Payer: Self-pay | Source: Home / Self Care | Attending: Ophthalmology

## 2020-10-03 ENCOUNTER — Encounter (HOSPITAL_COMMUNITY): Payer: Self-pay | Admitting: Ophthalmology

## 2020-10-03 DIAGNOSIS — J449 Chronic obstructive pulmonary disease, unspecified: Secondary | ICD-10-CM | POA: Diagnosis not present

## 2020-10-03 DIAGNOSIS — E7800 Pure hypercholesterolemia, unspecified: Secondary | ICD-10-CM | POA: Diagnosis not present

## 2020-10-03 DIAGNOSIS — Z8673 Personal history of transient ischemic attack (TIA), and cerebral infarction without residual deficits: Secondary | ICD-10-CM | POA: Diagnosis not present

## 2020-10-03 DIAGNOSIS — F17210 Nicotine dependence, cigarettes, uncomplicated: Secondary | ICD-10-CM | POA: Diagnosis not present

## 2020-10-03 DIAGNOSIS — Z880 Allergy status to penicillin: Secondary | ICD-10-CM | POA: Diagnosis not present

## 2020-10-03 DIAGNOSIS — Z886 Allergy status to analgesic agent status: Secondary | ICD-10-CM | POA: Diagnosis not present

## 2020-10-03 DIAGNOSIS — H25812 Combined forms of age-related cataract, left eye: Secondary | ICD-10-CM | POA: Diagnosis not present

## 2020-10-03 DIAGNOSIS — E1136 Type 2 diabetes mellitus with diabetic cataract: Secondary | ICD-10-CM | POA: Insufficient documentation

## 2020-10-03 DIAGNOSIS — I10 Essential (primary) hypertension: Secondary | ICD-10-CM | POA: Insufficient documentation

## 2020-10-03 DIAGNOSIS — F17200 Nicotine dependence, unspecified, uncomplicated: Secondary | ICD-10-CM | POA: Diagnosis not present

## 2020-10-03 DIAGNOSIS — Z79899 Other long term (current) drug therapy: Secondary | ICD-10-CM | POA: Insufficient documentation

## 2020-10-03 DIAGNOSIS — Z794 Long term (current) use of insulin: Secondary | ICD-10-CM | POA: Diagnosis not present

## 2020-10-03 DIAGNOSIS — E119 Type 2 diabetes mellitus without complications: Secondary | ICD-10-CM | POA: Diagnosis not present

## 2020-10-03 HISTORY — PX: CATARACT EXTRACTION W/PHACO: SHX586

## 2020-10-03 LAB — GLUCOSE, CAPILLARY: Glucose-Capillary: 123 mg/dL — ABNORMAL HIGH (ref 70–99)

## 2020-10-03 SURGERY — PHACOEMULSIFICATION, CATARACT, WITH IOL INSERTION
Anesthesia: Monitor Anesthesia Care | Site: Eye | Laterality: Left

## 2020-10-03 MED ORDER — PHENYLEPHRINE HCL 2.5 % OP SOLN
1.0000 [drp] | OPHTHALMIC | Status: AC | PRN
  Administered 2020-10-03 (×3): 1 [drp] via OPHTHALMIC

## 2020-10-03 MED ORDER — SODIUM HYALURONATE 23 MG/ML IO SOLN
INTRAOCULAR | Status: DC | PRN
  Administered 2020-10-03: 0.6 mL via INTRAOCULAR

## 2020-10-03 MED ORDER — NEOMYCIN-POLYMYXIN-DEXAMETH 3.5-10000-0.1 OP SUSP
OPHTHALMIC | Status: DC | PRN
  Administered 2020-10-03: 1 [drp] via OPHTHALMIC

## 2020-10-03 MED ORDER — CYCLOPENTOLATE-PHENYLEPHRINE 0.2-1 % OP SOLN
1.0000 [drp] | OPHTHALMIC | Status: AC | PRN
  Administered 2020-10-03 (×3): 1 [drp] via OPHTHALMIC

## 2020-10-03 MED ORDER — LACTATED RINGERS IV SOLN: INTRAVENOUS | Status: DC

## 2020-10-03 MED ORDER — BSS IO SOLN
INTRAOCULAR | Status: DC | PRN
  Administered 2020-10-03: 15 mL via INTRAOCULAR

## 2020-10-03 MED ORDER — MIDAZOLAM HCL 2 MG/2ML IJ SOLN
INTRAMUSCULAR | Status: AC
  Filled 2020-10-03: qty 2

## 2020-10-03 MED ORDER — TETRACAINE HCL 0.5 % OP SOLN
1.0000 [drp] | OPHTHALMIC | Status: AC | PRN
  Administered 2020-10-03 (×3): 1 [drp] via OPHTHALMIC

## 2020-10-03 MED ORDER — EPINEPHRINE PF 1 MG/ML IJ SOLN
INTRAOCULAR | Status: DC | PRN
  Administered 2020-10-03: 500 mL

## 2020-10-03 MED ORDER — PROVISC 10 MG/ML IO SOLN
INTRAOCULAR | Status: DC | PRN
  Administered 2020-10-03: 0.85 mL via INTRAOCULAR

## 2020-10-03 MED ORDER — LIDOCAINE HCL (PF) 1 % IJ SOLN
INTRAOCULAR | Status: DC | PRN
  Administered 2020-10-03: 1 mL via OPHTHALMIC

## 2020-10-03 MED ORDER — EPINEPHRINE PF 1 MG/ML IJ SOLN
INTRAMUSCULAR | Status: AC
  Filled 2020-10-03: qty 2

## 2020-10-03 MED ORDER — POVIDONE-IODINE 5 % OP SOLN
OPHTHALMIC | Status: DC | PRN
  Administered 2020-10-03: 1 via OPHTHALMIC

## 2020-10-03 MED ORDER — LIDOCAINE HCL 3.5 % OP GEL
1.0000 "application " | Freq: Once | OPHTHALMIC | Status: AC
  Administered 2020-10-03: 1 via OPHTHALMIC

## 2020-10-03 MED ORDER — TETRACAINE 0.5 % OP SOLN OPTIME - NO CHARGE
OPHTHALMIC | Status: DC | PRN
  Administered 2020-10-03: 1 [drp] via OPHTHALMIC

## 2020-10-03 MED ORDER — MIDAZOLAM HCL 2 MG/2ML IJ SOLN
INTRAMUSCULAR | Status: DC | PRN
  Administered 2020-10-03: .5 mg via INTRAVENOUS

## 2020-10-03 MED ORDER — STERILE WATER FOR IRRIGATION IR SOLN
Status: DC | PRN
  Administered 2020-10-03: 1000 mL

## 2020-10-03 SURGICAL SUPPLY — 21 items
CLOTH BEACON ORANGE TIMEOUT ST (SAFETY) ×2 IMPLANT
DEVICE MILOOP (MISCELLANEOUS) IMPLANT
EYE SHIELD UNIVERSAL CLEAR (GAUZE/BANDAGES/DRESSINGS) ×2 IMPLANT
GLOVE BIOGEL PI IND STRL 7.0 (GLOVE) IMPLANT
GLOVE BIOGEL PI IND STRL 7.5 (GLOVE) IMPLANT
GLOVE BIOGEL PI INDICATOR 7.0 (GLOVE) ×4
GLOVE BIOGEL PI INDICATOR 7.5 (GLOVE) ×2
GOWN STRL REUS W/ TWL XL LVL3 (GOWN DISPOSABLE) IMPLANT
GOWN STRL REUS W/TWL XL LVL3 (GOWN DISPOSABLE) ×3
MILOOP DEVICE (MISCELLANEOUS)
NDL HYPO 18GX1.5 BLUNT FILL (NEEDLE) IMPLANT
NEEDLE HYPO 18GX1.5 BLUNT FILL (NEEDLE) ×3 IMPLANT
PAD ARMBOARD 7.5X6 YLW CONV (MISCELLANEOUS) ×4 IMPLANT
RING MALYGIN (MISCELLANEOUS) IMPLANT
RING MALYGIN 7.0 (MISCELLANEOUS) IMPLANT
SYR TB 1ML LL NO SAFETY (SYRINGE) ×2 IMPLANT
TAPE SURG TRANSPORE 1 IN (GAUZE/BANDAGES/DRESSINGS) IMPLANT
TAPE SURGICAL TRANSPORE 1 IN (GAUZE/BANDAGES/DRESSINGS) ×3
Tecnis 1-piece Intraocular Lens (Intraocular Lens) ×2 IMPLANT
VISCOELASTIC ADDITIONAL (OPHTHALMIC RELATED) IMPLANT
WATER STERILE IRR 250ML POUR (IV SOLUTION) ×2 IMPLANT

## 2020-10-03 NOTE — Anesthesia Postprocedure Evaluation (Signed)
Anesthesia Post Note  Patient: Amber Harris  Procedure(s) Performed: CATARACT EXTRACTION PHACO AND INTRAOCULAR LENS PLACEMENT LEFT EYE (Left Eye)  Patient location during evaluation: Phase II Anesthesia Type: MAC Level of consciousness: awake and oriented Pain management: pain level controlled Vital Signs Assessment: post-procedure vital signs reviewed and stable Respiratory status: spontaneous breathing Cardiovascular status: blood pressure returned to baseline Postop Assessment: no apparent nausea or vomiting Anesthetic complications: no   No complications documented.   Last Vitals:  Vitals:   10/03/20 1004  BP: (!) 166/73  Pulse: (!) 52  Resp: 14  Temp: 36.7 C  SpO2: 99%    Last Pain:  Vitals:   10/03/20 1004  TempSrc: Oral  PainSc: 3                  Karna Dupes

## 2020-10-03 NOTE — Op Note (Signed)
Date of procedure: 10/03/20  Pre-operative diagnosis: Visually significant age-related combined cataract, Left Eye (H25.812)  Post-operative diagnosis: Visually significant age-related combined cataract, Left Eye (H25.812)  Procedure: Removal of cataract via phacoemulsification and insertion of intra-ocular lens Johnson and Novice  +23.0D into the capsular bag of the Left Eye  Attending surgeon: Gerda Diss. Keshaun Dubey, MD, MA  Anesthesia: MAC, Topical Akten  Complications: None  Estimated Blood Loss: <70m (minimal)  Specimens: None  Implants: As above  Indications:  Visually significant age-related cataract, Left Eye  Procedure:  The patient was seen and identified in the pre-operative area. The operative eye was identified and dilated.  The operative eye was marked.  Topical anesthesia was administered to the operative eye.     The patient was then to the operative suite and placed in the supine position.  A timeout was performed confirming the patient, procedure to be performed, and all other relevant information.   The patient's face was prepped and draped in the usual fashion for intra-ocular surgery.  A lid speculum was placed into the operative eye and the surgical microscope moved into place and focused.  An inferotemporal paracentesis was created using a 20 gauge paracentesis blade.  Shugarcaine was injected into the anterior chamber.  Viscoelastic was injected into the anterior chamber.  A temporal clear-corneal main wound incision was created using a 2.438mmicrokeratome.  A continuous curvilinear capsulorrhexis was initiated using an irrigating cystitome and completed using capsulorrhexis forceps.  Hydrodissection and hydrodeliniation were performed.  Viscoelastic was injected into the anterior chamber.  A phacoemulsification handpiece and a chopper as a second instrument were used to remove the nucleus and epinucleus. The irrigation/aspiration handpiece was used to remove  any remaining cortical material.   The capsular bag was reinflated with viscoelastic, checked, and found to be intact.  The intraocular lens was inserted into the capsular bag.  The irrigation/aspiration handpiece was used to remove any remaining viscoelastic.  The clear corneal wound and paracentesis wounds were then hydrated and checked with Weck-Cels to be watertight.  The lid-speculum was removed.  The drape was removed.  The patient's face was cleaned with a wet and dry 4x4.   Maxitrol was instilled in the eye. A clear shield was taped over the eye. The patient was taken to the post-operative care unit in good condition, having tolerated the procedure well.  Post-Op Instructions: [facility] and will receive all other orders and instructions.

## 2020-10-03 NOTE — Interval H&P Note (Signed)
History and Physical Interval Note:  10/03/2020 10:48 AM  Amber Harris  has presented today for surgery, with the diagnosis of Nuclear sclerotic cataract - Left eye.  The various methods of treatment have been discussed with the patient and family. After consideration of risks, benefits and other options for treatment, the patient has consented to  Procedure(s) with comments: CATARACT EXTRACTION PHACO AND INTRAOCULAR LENS PLACEMENT ([place]) (Left) - left as a surgical intervention.  The patient's history has been reviewed, patient examined, no change in status, stable for surgery.  I have reviewed the patient's chart and labs.  Questions were answered to the patient's satisfaction.     Baruch Goldmann

## 2020-10-03 NOTE — Discharge Instructions (Signed)
Please discharge patient when stable, will follow up today with Dr. Marcianna Daily at the [facility] office immediately following discharge.  Leave shield in place until visit.  All paperwork with discharge instructions will be given at the office.  [facility] Address:  [address]  [place], [place] [address]             Monitored Anesthesia Care, Care After These instructions provide you with information about caring for yourself after your procedure. Your health care provider may also give you more specific instructions. Your treatment has been planned according to current medical practices, but problems sometimes occur. Call your health care provider if you have any problems or questions after your procedure. What can I expect after the procedure? After your procedure, you may:  Feel sleepy for several hours.  Feel clumsy and have poor balance for several hours.  Feel forgetful about what happened after the procedure.  Have poor judgment for several hours.  Feel nauseous or vomit.  Have a sore throat if you had a breathing tube during the procedure. Follow these instructions at home: For at least 24 hours after the procedure:      Have a responsible adult stay with you. It is important to have someone help care for you until you are awake and alert.  Rest as needed.  Do not: ? Participate in activities in which you could fall or become injured. ? Drive. ? Use heavy machinery. ? Drink alcohol. ? Take sleeping pills or medicines that cause drowsiness. ? Make important decisions or sign legal documents. ? Take care of children on your own. Eating and drinking  Follow the diet that is recommended by your health care provider.  If you vomit, drink water, juice, or soup when you can drink without vomiting.  Make sure you have little or no nausea before eating solid foods. General instructions  Take over-the-counter and  prescription medicines only as told by your health care provider.  If you have sleep apnea, surgery and certain medicines can increase your risk for breathing problems. Follow instructions from your health care provider about wearing your sleep device: ? Anytime you are sleeping, including during daytime naps. ? While taking prescription pain medicines, sleeping medicines, or medicines that make you drowsy.  If you smoke, do not smoke without supervision.  Keep all follow-up visits as told by your health care provider. This is important. Contact a health care provider if:  You keep feeling nauseous or you keep vomiting.  You feel light-headed.  You develop a rash.  You have a fever. Get help right away if:  You have trouble breathing. Summary  For several hours after your procedure, you may feel sleepy and have poor judgment.  Have a responsible adult stay with you for at least 24 hours or until you are awake and alert. This information is not intended to replace advice given to you by your health care provider. Make sure you discuss any questions you have with your health care provider. Document Revised: 02/13/2018 Document Reviewed: 03/07/2016 Elsevier Patient Education  2020 [employer]vier Inc.  

## 2020-10-03 NOTE — Anesthesia Preprocedure Evaluation (Signed)
Anesthesia Evaluation  Patient identified by MRN, date of birth, ID band Patient awake    Reviewed: Allergy & Precautions, H&P , NPO status , Patient's Chart, lab work & pertinent test results, reviewed documented beta blocker date and time   Airway Mallampati: II  TM Distance: >3 FB Neck ROM: full    Dental no notable dental hx.    Pulmonary shortness of breath, asthma , COPD, Current Smoker,    Pulmonary exam normal breath sounds clear to auscultation       Cardiovascular Exercise Tolerance: Good hypertension, negative cardio ROS   Rhythm:regular Rate:Normal     Neuro/Psych  Headaches, PSYCHIATRIC DISORDERS Anxiety Depression CVA, Residual Symptoms    GI/Hepatic Neg liver ROS, GERD  Medicated,  Endo/Other  negative endocrine ROSdiabetes  Renal/GU negative Renal ROS  negative genitourinary   Musculoskeletal   Abdominal   Peds  Hematology negative hematology ROS (+)   Anesthesia Other Findings   Reproductive/Obstetrics negative OB ROS                             Anesthesia Physical Anesthesia Plan  ASA: III  Anesthesia Plan: MAC   Post-op Pain Management:    Induction:   PONV Risk Score and Plan:   Airway Management Planned:   Additional Equipment:   Intra-op Plan:   Post-operative Plan:   Informed Consent: I have reviewed the patients History and Physical, chart, labs and discussed the procedure including the risks, benefits and alternatives for the proposed anesthesia with the patient or authorized representative who has indicated his/her understanding and acceptance.     Dental Advisory Given  Plan Discussed with: CRNA  Anesthesia Plan Comments:         Anesthesia Quick Evaluation

## 2020-10-03 NOTE — Transfer of Care (Signed)
2Immediate Anesthesia Transfer of Care Note  Patient: Amber Harris  Procedure(s) Performed: CATARACT EXTRACTION PHACO AND INTRAOCULAR LENS PLACEMENT LEFT EYE (Left Eye)  Patient Location: PACU  Anesthesia Type:MAC  Level of Consciousness: awake and alert   Airway & Oxygen Therapy: Patient Spontanous Breathing  Post-op Assessment: Report given to RN and Post -op Vital signs reviewed and stable  Post vital signs: Reviewed and stable  Last Vitals:  Vitals Value Taken Time  BP    Temp    Pulse    Resp    SpO2      Last Pain:  Vitals:   10/03/20 1004  TempSrc: Oral  PainSc: 3          Complications: No complications documented.

## 2020-10-06 ENCOUNTER — Encounter (HOSPITAL_COMMUNITY): Payer: Self-pay | Admitting: Ophthalmology

## 2020-10-10 NOTE — H&P (Signed)
Surgical History & Physical  Patient Name: Amber Harris DOB: 12/15/1960  Surgery: Cataract extraction with intraocular lens implant phacoemulsification; Right Eye  Surgeon: Baruch Goldmann MD Surgery Date:  10/17/2020 Pre-Op Date:  10/09/2020  HPI: A 81 Yr. old female patient The patient is returning after cataract surgery. The left eye is affected. Status post cataract surgery, which began 1 weeks ago: Since the last visit, the affected area is doing well. Pt states she can seen a "fish fin" or reflection in temporal peripheral vision The patient's vision is stable. Patient is following medication instructions. 3 different drops. The patient experiences no increase in floaters. The patient complains of difficulty when viewing TV, reading closed caption, news scrolls on TV, which began 1 year ago. The right eye is affected. The episode is gradual. The condition's severity increased since last visit. Symptoms occur when the patient is inside, outside and reading. The complaint is associated with glare. These are negatively affecting her quality. HPI was performed by Baruch Goldmann .  Medical History: Dry Eyes Cataracts Diabetes High Blood Pressure Lung Problems Hypercholesterolemia  Review of Systems Negative Allergic/Immunologic Negative Cardiovascular Negative Constitutional Negative Ear, Nose, Mouth & Throat Negative Endocrine Negative Eyes Negative Gastrointestinal Negative Genitourinary Negative Hemotologic/Lymphatic Negative Integumentary Negative Musculoskeletal Negative Neurological Negative Psychiatry Negative Respiratory  Social   Current every day smoker of Cigarettes 1 pack Per Day  Medication Vigamox, Ilevro,  Crestor, Cymbalta, Janumet, Jardiance, Risperdal, Lantus, Tresiba, Clonidine, Furosemide, Gabapentin, Verapamil, Prednisolone acetate,   Sx/Procedures Phaco c IOL OS,  Hysterectomy,   Drug Allergies  Aspirin, Pencillin, Seasonal allergies,   History  & Physical: Heent:  Cataract, Right eye NECK: supple without bruits LUNGS: lungs clear to auscultation CV: regular rate and rhythm Abdomen: soft and non-tender  Impression & Plan: Assessment: 1.  CATARACT EXTRACTION STATUS; Left Eye (Z98.42) 2.  INTRAOCULAR LENS IOL (Z96.1) 3.  COMBINED FORMS AGE RELATED CATARACT; Both Eyes (H25.813)  Plan: 1.  1 week after cataract surgery. Doing well with improved vision and normal eye pressure. Call with any problems or concerns. Stop Vigamox. Continue Ilevro 1 drop 1x/day for 3 more weeks. Continue Pred Acetate 1 drop 2x/day for 3 more weeks. 2.  Doing well since surgery Continue Post-op medications 3.  Dilates well - shugarcaine by protocol. Cataract accounts for the patient's decreased vision. This visual impairment is not correctable with a tolerable change in glasses or contact lenses. Cataract surgery with an implantation of a new lens should significantly improve the visual and functional status of the patient. Discussed all risks, benefits, alternatives, and potential complications. Discussed the procedures and recovery. Patient desires to have surgery. A-scan ordered and performed today for intra-ocular lens calculations. The surgery will be performed in order to improve vision for driving, reading, and for eye examinations. Recommend phacoemulsification with intra-ocular lens. Recommend Dextenza for post-operative pain and inflammation. Right Eye. Surgery required to correct imbalance of vision.

## 2020-10-13 ENCOUNTER — Other Ambulatory Visit: Payer: Self-pay | Admitting: Cardiology

## 2020-10-13 DIAGNOSIS — H25811 Combined forms of age-related cataract, right eye: Secondary | ICD-10-CM | POA: Diagnosis not present

## 2020-10-14 ENCOUNTER — Other Ambulatory Visit: Payer: Self-pay

## 2020-10-14 ENCOUNTER — Encounter (HOSPITAL_COMMUNITY)
Admission: RE | Admit: 2020-10-14 | Discharge: 2020-10-14 | Disposition: A | Discharge status: Home / Self Care | Payer: Medicare HMO | Source: Ambulatory Visit | Attending: Ophthalmology | Admitting: Ophthalmology

## 2020-10-15 ENCOUNTER — Other Ambulatory Visit (HOSPITAL_COMMUNITY)
Admission: RE | Admit: 2020-10-15 | Discharge: 2020-10-15 | Disposition: A | Discharge status: Home / Self Care | Payer: Medicare HMO | Source: Ambulatory Visit | Attending: Ophthalmology | Admitting: Ophthalmology

## 2020-10-15 ENCOUNTER — Other Ambulatory Visit: Payer: Self-pay

## 2020-10-15 DIAGNOSIS — Z20822 Contact with and (suspected) exposure to covid-19: Secondary | ICD-10-CM | POA: Insufficient documentation

## 2020-10-15 DIAGNOSIS — Z01812 Encounter for preprocedural laboratory examination: Secondary | ICD-10-CM | POA: Insufficient documentation

## 2020-10-16 LAB — SARS CORONAVIRUS 2 (TAT 6-24 HRS): SARS Coronavirus 2: NEGATIVE

## 2020-10-17 ENCOUNTER — Ambulatory Visit (HOSPITAL_COMMUNITY): Payer: Medicare HMO | Admitting: Anesthesiology

## 2020-10-17 ENCOUNTER — Encounter (HOSPITAL_COMMUNITY): Payer: Self-pay | Admitting: Ophthalmology

## 2020-10-17 ENCOUNTER — Encounter (HOSPITAL_COMMUNITY)
Admission: RE | Disposition: A | Discharge status: Home / Self Care | Payer: Self-pay | Source: Home / Self Care | Attending: Ophthalmology

## 2020-10-17 ENCOUNTER — Ambulatory Visit (HOSPITAL_COMMUNITY)
Admission: RE | Admit: 2020-10-17 | Discharge: 2020-10-17 | Disposition: A | Discharge status: Home / Self Care | Payer: Medicare HMO | Attending: Ophthalmology | Admitting: Ophthalmology

## 2020-10-17 DIAGNOSIS — N319 Neuromuscular dysfunction of bladder, unspecified: Secondary | ICD-10-CM | POA: Diagnosis not present

## 2020-10-17 DIAGNOSIS — Z79899 Other long term (current) drug therapy: Secondary | ICD-10-CM | POA: Diagnosis not present

## 2020-10-17 DIAGNOSIS — E119 Type 2 diabetes mellitus without complications: Secondary | ICD-10-CM | POA: Diagnosis not present

## 2020-10-17 DIAGNOSIS — F17210 Nicotine dependence, cigarettes, uncomplicated: Secondary | ICD-10-CM | POA: Insufficient documentation

## 2020-10-17 DIAGNOSIS — Z9842 Cataract extraction status, left eye: Secondary | ICD-10-CM | POA: Insufficient documentation

## 2020-10-17 DIAGNOSIS — E1136 Type 2 diabetes mellitus with diabetic cataract: Secondary | ICD-10-CM | POA: Diagnosis not present

## 2020-10-17 DIAGNOSIS — I10 Essential (primary) hypertension: Secondary | ICD-10-CM | POA: Insufficient documentation

## 2020-10-17 DIAGNOSIS — Z7984 Long term (current) use of oral hypoglycemic drugs: Secondary | ICD-10-CM | POA: Diagnosis not present

## 2020-10-17 DIAGNOSIS — Z961 Presence of intraocular lens: Secondary | ICD-10-CM | POA: Diagnosis not present

## 2020-10-17 DIAGNOSIS — H25811 Combined forms of age-related cataract, right eye: Secondary | ICD-10-CM | POA: Diagnosis not present

## 2020-10-17 DIAGNOSIS — J449 Chronic obstructive pulmonary disease, unspecified: Secondary | ICD-10-CM | POA: Insufficient documentation

## 2020-10-17 DIAGNOSIS — Z881 Allergy status to other antibiotic agents status: Secondary | ICD-10-CM | POA: Insufficient documentation

## 2020-10-17 DIAGNOSIS — Z880 Allergy status to penicillin: Secondary | ICD-10-CM | POA: Insufficient documentation

## 2020-10-17 DIAGNOSIS — I252 Old myocardial infarction: Secondary | ICD-10-CM | POA: Diagnosis not present

## 2020-10-17 HISTORY — PX: CATARACT EXTRACTION W/PHACO: SHX586

## 2020-10-17 LAB — GLUCOSE, CAPILLARY: Glucose-Capillary: 127 mg/dL — ABNORMAL HIGH (ref 70–99)

## 2020-10-17 SURGERY — PHACOEMULSIFICATION, CATARACT, WITH IOL INSERTION
Anesthesia: Monitor Anesthesia Care | Site: Eye | Laterality: Right

## 2020-10-17 MED ORDER — BSS IO SOLN
INTRAOCULAR | Status: DC | PRN
  Administered 2020-10-17: 15 mL via INTRAOCULAR

## 2020-10-17 MED ORDER — LIDOCAINE HCL (PF) 1 % IJ SOLN
INTRAOCULAR | Status: DC | PRN
  Administered 2020-10-17: 1 mL via OPHTHALMIC

## 2020-10-17 MED ORDER — MIDAZOLAM HCL 2 MG/2ML IJ SOLN
INTRAMUSCULAR | Status: AC
  Filled 2020-10-17: qty 2

## 2020-10-17 MED ORDER — MIDAZOLAM HCL 2 MG/2ML IJ SOLN
2.0000 mg | Freq: Once | INTRAMUSCULAR | Status: AC
  Administered 2020-10-17: 2 mg via INTRAVENOUS

## 2020-10-17 MED ORDER — LIDOCAINE HCL 3.5 % OP GEL: 1.0000 "application " | Freq: Once | OPHTHALMIC | Status: DC

## 2020-10-17 MED ORDER — TETRACAINE HCL 0.5 % OP SOLN
1.0000 [drp] | OPHTHALMIC | Status: AC | PRN
  Administered 2020-10-17 (×3): 1 [drp] via OPHTHALMIC

## 2020-10-17 MED ORDER — POVIDONE-IODINE 5 % OP SOLN
OPHTHALMIC | Status: DC | PRN
  Administered 2020-10-17: 1 via OPHTHALMIC

## 2020-10-17 MED ORDER — PROVISC 10 MG/ML IO SOLN
INTRAOCULAR | Status: DC | PRN
  Administered 2020-10-17: 0.85 mL via INTRAOCULAR

## 2020-10-17 MED ORDER — MIDAZOLAM HCL 2 MG/2ML IJ SOLN
INTRAMUSCULAR | Status: DC | PRN
  Administered 2020-10-17 (×2): .5 mg via INTRAVENOUS

## 2020-10-17 MED ORDER — EPINEPHRINE PF 1 MG/ML IJ SOLN
INTRAOCULAR | Status: DC | PRN
  Administered 2020-10-17: 500 mL

## 2020-10-17 MED ORDER — CYCLOPENTOLATE-PHENYLEPHRINE 0.2-1 % OP SOLN
1.0000 [drp] | OPHTHALMIC | Status: AC | PRN
  Administered 2020-10-17 (×3): 1 [drp] via OPHTHALMIC

## 2020-10-17 MED ORDER — NEOMYCIN-POLYMYXIN-DEXAMETH 3.5-10000-0.1 OP SUSP
OPHTHALMIC | Status: DC | PRN
  Administered 2020-10-17: 1 [drp] via OPHTHALMIC

## 2020-10-17 MED ORDER — SODIUM HYALURONATE 23 MG/ML IO SOLN
INTRAOCULAR | Status: DC | PRN
  Administered 2020-10-17: 0.6 mL via INTRAOCULAR

## 2020-10-17 MED ORDER — LIDOCAINE 3.5 % OP GEL OPTIME - NO CHARGE
OPHTHALMIC | Status: DC | PRN
  Administered 2020-10-17: 1 [drp] via OPHTHALMIC

## 2020-10-17 MED ORDER — STERILE WATER FOR IRRIGATION IR SOLN
Status: DC | PRN
  Administered 2020-10-17: 250 mL

## 2020-10-17 MED ORDER — PHENYLEPHRINE HCL 2.5 % OP SOLN
1.0000 [drp] | OPHTHALMIC | Status: AC | PRN
  Administered 2020-10-17 (×3): 1 [drp] via OPHTHALMIC

## 2020-10-17 MED ORDER — EPINEPHRINE PF 1 MG/ML IJ SOLN
INTRAMUSCULAR | Status: AC
  Filled 2020-10-17: qty 2

## 2020-10-17 SURGICAL SUPPLY — 15 items
CLOTH BEACON ORANGE TIMEOUT ST (SAFETY) ×2 IMPLANT
EYE SHIELD UNIVERSAL CLEAR (GAUZE/BANDAGES/DRESSINGS) ×2 IMPLANT
GLOVE BIOGEL PI IND STRL 7.0 (GLOVE) IMPLANT
GLOVE BIOGEL PI INDICATOR 7.0 (GLOVE) ×6
GOWN STRL REUS W/ TWL XL LVL3 (GOWN DISPOSABLE) IMPLANT
GOWN STRL REUS W/TWL XL LVL3 (GOWN DISPOSABLE) ×3
NDL HYPO 18GX1.5 BLUNT FILL (NEEDLE) IMPLANT
NEEDLE HYPO 18GX1.5 BLUNT FILL (NEEDLE) ×3 IMPLANT
PAD ARMBOARD 7.5X6 YLW CONV (MISCELLANEOUS) ×2 IMPLANT
SYR TB 1ML LL NO SAFETY (SYRINGE) ×2 IMPLANT
TAPE SURG TRANSPORE 1 IN (GAUZE/BANDAGES/DRESSINGS) IMPLANT
TAPE SURGICAL TRANSPORE 1 IN (GAUZE/BANDAGES/DRESSINGS) ×3
TECNIS IOL (Intraocular Lens) ×2 IMPLANT
VISCOELASTIC ADDITIONAL (OPHTHALMIC RELATED) IMPLANT
WATER STERILE IRR 250ML POUR (IV SOLUTION) ×2 IMPLANT

## 2020-10-17 NOTE — Op Note (Signed)
Date of procedure: 10/17/20  Pre-operative diagnosis:  Visually significant combined form age-related cataract, Right Eye (H25.811)  Post-operative diagnosis:  Visually significant combined form age-related cataract, Right Eye (H25.811)  Procedure: Removal of cataract via phacoemulsification and insertion of intra-ocular lens Wynetta Emery and [employer] DCB00  +22.0D into the capsular bag of the Right Eye  Attending surgeon: Gerda Diss. Arihanna Estabrook, MD, MA  Anesthesia: MAC, Topical Akten  Complications: None  Estimated Blood Loss: <87m (minimal)  Specimens: None  Implants: As above  Indications:  Visually significant age-related cataract, Right Eye  Procedure:  The patient was seen and identified in the pre-operative area. The operative eye was identified and dilated.  The operative eye was marked.  Topical anesthesia was administered to the operative eye.     The patient was then to the operative suite and placed in the supine position.  A timeout was performed confirming the patient, procedure to be performed, and all other relevant information.   The patient's face was prepped and draped in the usual fashion for intra-ocular surgery.  A lid speculum was placed into the operative eye and the surgical microscope moved into place and focused.  A superotemporal paracentesis was created using a 20 gauge paracentesis blade.  Shugarcaine was injected into the anterior chamber.  Viscoelastic was injected into the anterior chamber.  A temporal clear-corneal main wound incision was created using a 2.451mmicrokeratome.  A continuous curvilinear capsulorrhexis was initiated using an irrigating cystitome and completed using capsulorrhexis forceps.  Hydrodissection and hydrodeliniation were performed.  Viscoelastic was injected into the anterior chamber.  A phacoemulsification handpiece and a chopper as a second instrument were used to remove the nucleus and epinucleus. The irrigation/aspiration handpiece was  used to remove any remaining cortical material.   The capsular bag was reinflated with viscoelastic, checked, and found to be intact.  The intraocular lens was inserted into the capsular bag.  The irrigation/aspiration handpiece was used to remove any remaining viscoelastic.  The clear corneal wound and paracentesis wounds were then hydrated and checked with Weck-Cels to be watertight.  The lid-speculum was removed.  The drape was removed.  The patient's face was cleaned with a wet and dry 4x4.   Maxitrol was instilled in the eye. A clear shield was taped over the eye. The patient was taken to the post-operative care unit in good condition, having tolerated the procedure well.  Post-Op Instructions: [facility] and will receive all other orders and instructions.

## 2020-10-17 NOTE — Anesthesia Postprocedure Evaluation (Signed)
Anesthesia Post Note  Patient: Amber Harris  Procedure(s) Performed: CATARACT EXTRACTION PHACO AND INTRAOCULAR LENS PLACEMENT RIGHT EYE (Right Eye)  Patient location during evaluation: Short Stay Anesthesia Type: MAC Level of consciousness: awake and alert Pain management: pain level controlled Vital Signs Assessment: post-procedure vital signs reviewed and stable Respiratory status: spontaneous breathing Cardiovascular status: stable Postop Assessment: no apparent nausea or vomiting Anesthetic complications: no   No complications documented.   Last Vitals:  Vitals:   10/17/20 0852 10/17/20 0925  BP: (!) 186/74 (!) 160/87  Pulse: 61 (!) 59  Resp:  18  Temp: 36.6 C 36.5 C  SpO2: 98% 99%    Last Pain:  Vitals:   10/17/20 0925  TempSrc: Oral  PainSc: 0-No pain                 Everette Rank

## 2020-10-17 NOTE — Anesthesia Preprocedure Evaluation (Addendum)
Anesthesia Evaluation  Patient identified by MRN, date of birth, ID band Patient awake    Reviewed: Allergy & Precautions, NPO status , Patient's Chart, lab work & pertinent test results  History of Anesthesia Complications Negative for: history of anesthetic complications  Airway Mallampati: II  TM Distance: >3 FB Neck ROM: Full    Dental  (+) Dental Advisory Given No notable dental injury:   Pulmonary shortness of breath and with exertion, asthma , COPD, Current Smoker and Patient abstained from smoking.,    Pulmonary exam normal breath sounds clear to auscultation       Cardiovascular hypertension, Pt. on medications + Past MI  Normal cardiovascular exam Rhythm:Regular Rate:Normal  Sinus  Bradycardia  -Poor R-wave progression -nonspecific -consider old anterior infarct.   -Anterolateral ST-elevation -repolarization variant.   BORDERLINE    Neuro/Psych  Headaches, PSYCHIATRIC DISORDERS Anxiety Depression CVA, Residual Symptoms    GI/Hepatic GERD  ,  Endo/Other  diabetes, Well Controlled, Type 2, Insulin Dependent, Oral Hypoglycemic Agents  Renal/GU  bladder dysfunction      Musculoskeletal  (+) Arthritis ,   Abdominal   Peds  Hematology   Anesthesia Other Findings   Reproductive/Obstetrics                            Anesthesia Physical Anesthesia Plan  ASA: III  Anesthesia Plan: MAC   Post-op Pain Management:    Induction: Intravenous  PONV Risk Score and Plan:   Airway Management Planned: Nasal Cannula and Natural Airway  Additional Equipment:   Intra-op Plan:   Post-operative Plan:   Informed Consent: I have reviewed the patients History and Physical, chart, labs and discussed the procedure including the risks, benefits and alternatives for the proposed anesthesia with the patient or authorized representative who has indicated his/her understanding and acceptance.      Dental advisory given  Plan Discussed with: CRNA and Surgeon  Anesthesia Plan Comments:         Anesthesia Quick Evaluation

## 2020-10-17 NOTE — Transfer of Care (Signed)
Immediate Anesthesia Transfer of Care Note  Patient: Amber Harris  Procedure(s) Performed: CATARACT EXTRACTION PHACO AND INTRAOCULAR LENS PLACEMENT RIGHT EYE (Right Eye)  Patient Location: Short Stay  Anesthesia Type:MAC  Level of Consciousness: awake, alert , oriented and patient cooperative  Airway & Oxygen Therapy: Patient Spontanous Breathing  Post-op Assessment: Report given to RN and Post -op Vital signs reviewed and stable  Post vital signs: Reviewed and stable  Last Vitals:  Vitals Value Taken Time  BP    Temp    Pulse    Resp    SpO2      Last Pain:  Vitals:   10/17/20 0852  TempSrc: Oral  PainSc: 0-No pain      Patients Stated Pain Goal: 6 (17/40/81 4481)  Complications: No complications documented.

## 2020-10-17 NOTE — Interval H&P Note (Signed)
History and Physical Interval Note:  10/17/2020 8:58 AM  Amber Harris  has presented today for surgery, with the diagnosis of Nuclear sclerotic cataract - Right eye.  The various methods of treatment have been discussed with the patient and family. After consideration of risks, benefits and other options for treatment, the patient has consented to  Procedure(s) with comments: CATARACT EXTRACTION PHACO AND INTRAOCULAR LENS PLACEMENT RIGHT EYE (Right) - right as a surgical intervention.  The patient's history has been reviewed, patient examined, no change in status, stable for surgery.  I have reviewed the patient's chart and labs.  Questions were answered to the patient's satisfaction.     Baruch Goldmann

## 2020-10-17 NOTE — Discharge Instructions (Signed)
Please discharge patient when stable, will follow up today with Dr. Abbygale Lapid at the [facility] office immediately following discharge.  Leave shield in place until visit.  All paperwork with discharge instructions will be given at the office.  [facility] Address:  [address]  [place], [place] [address]             Monitored Anesthesia Care, Care After These instructions provide you with information about caring for yourself after your procedure. Your health care provider may also give you more specific instructions. Your treatment has been planned according to current medical practices, but problems sometimes occur. Call your health care provider if you have any problems or questions after your procedure. What can I expect after the procedure? After your procedure, you may:  Feel sleepy for several hours.  Feel clumsy and have poor balance for several hours.  Feel forgetful about what happened after the procedure.  Have poor judgment for several hours.  Feel nauseous or vomit.  Have a sore throat if you had a breathing tube during the procedure. Follow these instructions at home: For at least 24 hours after the procedure:      Have a responsible adult stay with you. It is important to have someone help care for you until you are awake and alert.  Rest as needed.  Do not: ? Participate in activities in which you could fall or become injured. ? Drive. ? Use heavy machinery. ? Drink alcohol. ? Take sleeping pills or medicines that cause drowsiness. ? Make important decisions or sign legal documents. ? Take care of children on your own. Eating and drinking  Follow the diet that is recommended by your health care provider.  If you vomit, drink water, juice, or soup when you can drink without vomiting.  Make sure you have little or no nausea before eating solid foods. General instructions  Take over-the-counter and  prescription medicines only as told by your health care provider.  If you have sleep apnea, surgery and certain medicines can increase your risk for breathing problems. Follow instructions from your health care provider about wearing your sleep device: ? Anytime you are sleeping, including during daytime naps. ? While taking prescription pain medicines, sleeping medicines, or medicines that make you drowsy.  If you smoke, do not smoke without supervision.  Keep all follow-up visits as told by your health care provider. This is important. Contact a health care provider if:  You keep feeling nauseous or you keep vomiting.  You feel light-headed.  You develop a rash.  You have a fever. Get help right away if:  You have trouble breathing. Summary  For several hours after your procedure, you may feel sleepy and have poor judgment.  Have a responsible adult stay with you for at least 24 hours or until you are awake and alert. This information is not intended to replace advice given to you by your health care provider. Make sure you discuss any questions you have with your health care provider. Document Revised: 02/13/2018 Document Reviewed: 03/07/2016 Elsevier Patient Education  2020 [employer] Inc.  

## 2020-10-20 ENCOUNTER — Encounter (HOSPITAL_COMMUNITY): Payer: Self-pay | Admitting: Ophthalmology

## 2020-11-19 DIAGNOSIS — Z0100 Encounter for examination of eyes and vision without abnormal findings: Secondary | ICD-10-CM | POA: Diagnosis not present

## 2020-12-02 DIAGNOSIS — Z23 Encounter for immunization: Secondary | ICD-10-CM | POA: Diagnosis not present

## 2020-12-25 DIAGNOSIS — I10 Essential (primary) hypertension: Secondary | ICD-10-CM | POA: Diagnosis not present

## 2020-12-25 DIAGNOSIS — M5459 Other low back pain: Secondary | ICD-10-CM | POA: Diagnosis not present

## 2020-12-25 DIAGNOSIS — Z6838 Body mass index (BMI) 38.0-38.9, adult: Secondary | ICD-10-CM | POA: Diagnosis not present

## 2020-12-25 DIAGNOSIS — Z8673 Personal history of transient ischemic attack (TIA), and cerebral infarction without residual deficits: Secondary | ICD-10-CM | POA: Diagnosis not present

## 2020-12-25 DIAGNOSIS — L278 Dermatitis due to other substances taken internally: Secondary | ICD-10-CM | POA: Diagnosis not present

## 2020-12-25 DIAGNOSIS — Z0000 Encounter for general adult medical examination without abnormal findings: Secondary | ICD-10-CM | POA: Diagnosis not present

## 2020-12-25 DIAGNOSIS — E1143 Type 2 diabetes mellitus with diabetic autonomic (poly)neuropathy: Secondary | ICD-10-CM | POA: Diagnosis not present

## 2020-12-25 DIAGNOSIS — E0843 Diabetes mellitus due to underlying condition with diabetic autonomic (poly)neuropathy: Secondary | ICD-10-CM | POA: Diagnosis not present

## 2021-01-26 DIAGNOSIS — E1165 Type 2 diabetes mellitus with hyperglycemia: Secondary | ICD-10-CM | POA: Diagnosis not present

## 2021-01-26 DIAGNOSIS — I10 Essential (primary) hypertension: Secondary | ICD-10-CM | POA: Diagnosis not present

## 2021-02-14 ENCOUNTER — Inpatient Hospital Stay (HOSPITAL_COMMUNITY)
Admission: AD | Admit: 2021-02-14 | Discharge: 2021-02-17 | DRG: 287 | Disposition: A | Discharge status: Home / Self Care | Payer: Medicare HMO | Source: Other Acute Inpatient Hospital | Attending: Cardiology | Admitting: Cardiology

## 2021-02-14 DIAGNOSIS — I63233 Cerebral infarction due to unspecified occlusion or stenosis of bilateral carotid arteries: Secondary | ICD-10-CM | POA: Diagnosis not present

## 2021-02-14 DIAGNOSIS — H5462 Unqualified visual loss, left eye, normal vision right eye: Secondary | ICD-10-CM | POA: Diagnosis not present

## 2021-02-14 DIAGNOSIS — Z7982 Long term (current) use of aspirin: Secondary | ICD-10-CM

## 2021-02-14 DIAGNOSIS — R7989 Other specified abnormal findings of blood chemistry: Secondary | ICD-10-CM | POA: Diagnosis present

## 2021-02-14 DIAGNOSIS — I672 Cerebral atherosclerosis: Secondary | ICD-10-CM | POA: Diagnosis not present

## 2021-02-14 DIAGNOSIS — R29818 Other symptoms and signs involving the nervous system: Secondary | ICD-10-CM | POA: Diagnosis not present

## 2021-02-14 DIAGNOSIS — R414 Neurologic neglect syndrome: Secondary | ICD-10-CM | POA: Diagnosis not present

## 2021-02-14 DIAGNOSIS — E669 Obesity, unspecified: Secondary | ICD-10-CM | POA: Diagnosis not present

## 2021-02-14 DIAGNOSIS — Z79899 Other long term (current) drug therapy: Secondary | ICD-10-CM

## 2021-02-14 DIAGNOSIS — R0602 Shortness of breath: Secondary | ICD-10-CM | POA: Diagnosis not present

## 2021-02-14 DIAGNOSIS — Z794 Long term (current) use of insulin: Secondary | ICD-10-CM | POA: Diagnosis not present

## 2021-02-14 DIAGNOSIS — Z7984 Long term (current) use of oral hypoglycemic drugs: Secondary | ICD-10-CM | POA: Diagnosis not present

## 2021-02-14 DIAGNOSIS — Z880 Allergy status to penicillin: Secondary | ICD-10-CM | POA: Diagnosis not present

## 2021-02-14 DIAGNOSIS — G43109 Migraine with aura, not intractable, without status migrainosus: Secondary | ICD-10-CM | POA: Diagnosis not present

## 2021-02-14 DIAGNOSIS — I10 Essential (primary) hypertension: Secondary | ICD-10-CM | POA: Diagnosis present

## 2021-02-14 DIAGNOSIS — K219 Gastro-esophageal reflux disease without esophagitis: Secondary | ICD-10-CM | POA: Diagnosis present

## 2021-02-14 DIAGNOSIS — Z886 Allergy status to analgesic agent status: Secondary | ICD-10-CM

## 2021-02-14 DIAGNOSIS — H547 Unspecified visual loss: Secondary | ICD-10-CM | POA: Diagnosis not present

## 2021-02-14 DIAGNOSIS — J45909 Unspecified asthma, uncomplicated: Secondary | ICD-10-CM | POA: Diagnosis not present

## 2021-02-14 DIAGNOSIS — E119 Type 2 diabetes mellitus without complications: Secondary | ICD-10-CM

## 2021-02-14 DIAGNOSIS — I252 Old myocardial infarction: Secondary | ICD-10-CM

## 2021-02-14 DIAGNOSIS — E876 Hypokalemia: Secondary | ICD-10-CM | POA: Diagnosis not present

## 2021-02-14 DIAGNOSIS — R001 Bradycardia, unspecified: Secondary | ICD-10-CM | POA: Diagnosis present

## 2021-02-14 DIAGNOSIS — E785 Hyperlipidemia, unspecified: Secondary | ICD-10-CM | POA: Diagnosis present

## 2021-02-14 DIAGNOSIS — Z6836 Body mass index (BMI) 36.0-36.9, adult: Secondary | ICD-10-CM

## 2021-02-14 DIAGNOSIS — J449 Chronic obstructive pulmonary disease, unspecified: Secondary | ICD-10-CM | POA: Diagnosis present

## 2021-02-14 DIAGNOSIS — Z20822 Contact with and (suspected) exposure to covid-19: Secondary | ICD-10-CM | POA: Diagnosis not present

## 2021-02-14 DIAGNOSIS — F17210 Nicotine dependence, cigarettes, uncomplicated: Secondary | ICD-10-CM | POA: Diagnosis present

## 2021-02-14 DIAGNOSIS — I200 Unstable angina: Secondary | ICD-10-CM | POA: Diagnosis not present

## 2021-02-14 DIAGNOSIS — I25110 Atherosclerotic heart disease of native coronary artery with unstable angina pectoris: Secondary | ICD-10-CM | POA: Diagnosis not present

## 2021-02-14 DIAGNOSIS — Z720 Tobacco use: Secondary | ICD-10-CM | POA: Diagnosis present

## 2021-02-14 DIAGNOSIS — Z8673 Personal history of transient ischemic attack (TIA), and cerebral infarction without residual deficits: Secondary | ICD-10-CM

## 2021-02-14 DIAGNOSIS — R079 Chest pain, unspecified: Secondary | ICD-10-CM | POA: Diagnosis present

## 2021-02-14 DIAGNOSIS — I517 Cardiomegaly: Secondary | ICD-10-CM | POA: Diagnosis not present

## 2021-02-14 DIAGNOSIS — I350 Nonrheumatic aortic (valve) stenosis: Secondary | ICD-10-CM | POA: Diagnosis not present

## 2021-02-14 DIAGNOSIS — Z79818 Long term (current) use of other agents affecting estrogen receptors and estrogen levels: Secondary | ICD-10-CM | POA: Diagnosis not present

## 2021-02-14 DIAGNOSIS — H539 Unspecified visual disturbance: Secondary | ICD-10-CM | POA: Diagnosis not present

## 2021-02-14 DIAGNOSIS — I119 Hypertensive heart disease without heart failure: Secondary | ICD-10-CM | POA: Diagnosis not present

## 2021-02-14 DIAGNOSIS — R000 Tachycardia, unspecified: Secondary | ICD-10-CM | POA: Diagnosis not present

## 2021-02-14 DIAGNOSIS — R0902 Hypoxemia: Secondary | ICD-10-CM | POA: Diagnosis not present

## 2021-02-14 DIAGNOSIS — R072 Precordial pain: Secondary | ICD-10-CM | POA: Diagnosis not present

## 2021-02-14 DIAGNOSIS — I639 Cerebral infarction, unspecified: Secondary | ICD-10-CM | POA: Diagnosis not present

## 2021-02-14 DIAGNOSIS — R471 Dysarthria and anarthria: Secondary | ICD-10-CM | POA: Diagnosis not present

## 2021-02-14 DIAGNOSIS — R531 Weakness: Secondary | ICD-10-CM | POA: Diagnosis not present

## 2021-02-14 DIAGNOSIS — R0689 Other abnormalities of breathing: Secondary | ICD-10-CM | POA: Diagnosis not present

## 2021-02-14 DIAGNOSIS — G459 Transient cerebral ischemic attack, unspecified: Secondary | ICD-10-CM | POA: Diagnosis not present

## 2021-02-14 DIAGNOSIS — R0789 Other chest pain: Secondary | ICD-10-CM | POA: Diagnosis not present

## 2021-02-14 DIAGNOSIS — E7800 Pure hypercholesterolemia, unspecified: Secondary | ICD-10-CM | POA: Diagnosis not present

## 2021-02-14 LAB — COMPREHENSIVE METABOLIC PANEL
ALT: 26 U/L (ref 0–44)
AST: 28 U/L (ref 15–41)
Albumin: 3.5 g/dL (ref 3.5–5.0)
Alkaline Phosphatase: 81 U/L (ref 38–126)
Anion gap: 7 (ref 5–15)
BUN: 6 mg/dL (ref 6–20)
CO2: 27 mmol/L (ref 22–32)
Calcium: 9.1 mg/dL (ref 8.9–10.3)
Chloride: 105 mmol/L (ref 98–111)
Creatinine, Ser: 0.94 mg/dL (ref 0.44–1.00)
GFR, Estimated: >60 mL/min (ref >60)
Glucose, Bld: 84 mg/dL (ref 70–99)
Potassium: 3.4 mmol/L — ABNORMAL LOW (ref 3.5–5.1)
Sodium: 139 mmol/L (ref 135–145)
Total Bilirubin: 1.5 mg/dL — ABNORMAL HIGH (ref 0.3–1.2)
Total Protein: 7.5 g/dL (ref 6.5–8.1)

## 2021-02-14 LAB — HEMOGLOBIN A1C
Hgb A1c MFr Bld: 6.3 % — ABNORMAL HIGH (ref 4.8–5.6)
Mean Plasma Glucose: 134.11 mg/dL

## 2021-02-14 LAB — GLUCOSE, CAPILLARY: Glucose-Capillary: 132 mg/dL — ABNORMAL HIGH (ref 70–99)

## 2021-02-14 LAB — TSH: TSH: 0.997 u[IU]/mL (ref 0.350–4.500)

## 2021-02-14 LAB — BRAIN NATRIURETIC PEPTIDE: B Natriuretic Peptide: 70.1 pg/mL (ref 0.0–100.0)

## 2021-02-14 LAB — MAGNESIUM: Magnesium: 2.1 mg/dL (ref 1.7–2.4)

## 2021-02-14 LAB — TROPONIN I (HIGH SENSITIVITY): Troponin I (High Sensitivity): 7 ng/L (ref <18)

## 2021-02-14 MED ORDER — CLONIDINE HCL 0.2 MG PO TABS
0.3000 mg | ORAL_TABLET | Freq: Three times a day (TID) | ORAL | Status: DC
  Administered 2021-02-14 – 2021-02-16 (×5): 0.3 mg via ORAL
  Filled 2021-02-14 (×5): qty 1

## 2021-02-14 MED ORDER — ALBUTEROL 90 MCG/ACT IN AERS: 2.0000 | INHALATION_SPRAY | Freq: Four times a day (QID) | RESPIRATORY_TRACT | Status: DC | PRN

## 2021-02-14 MED ORDER — MULTIVITAMIN ADULT PO CHEW: 1.0000 | CHEWABLE_TABLET | Freq: Every day | ORAL | Status: DC

## 2021-02-14 MED ORDER — METFORMIN HCL 500 MG PO TABS
500.0000 mg | ORAL_TABLET | Freq: Two times a day (BID) | ORAL | Status: DC
  Administered 2021-02-15: 500 mg via ORAL
  Filled 2021-02-14: qty 1

## 2021-02-14 MED ORDER — METHOCARBAMOL 500 MG PO TABS
500.0000 mg | ORAL_TABLET | Freq: Every day | ORAL | Status: DC
Start: 2021-02-14 — End: 2021-02-15
  Administered 2021-02-14: 500 mg via ORAL
  Filled 2021-02-14: qty 1

## 2021-02-14 MED ORDER — INSULIN GLARGINE 100 UNIT/ML ~~LOC~~ SOLN
20.0000 [IU] | Freq: Every day | SUBCUTANEOUS | Status: DC
  Administered 2021-02-14 – 2021-02-16 (×3): 20 [IU] via SUBCUTANEOUS
  Filled 2021-02-14 (×4): qty 0.2

## 2021-02-14 MED ORDER — HEPARIN BOLUS VIA INFUSION
4000.0000 [IU] | Freq: Once | INTRAVENOUS | Status: AC
  Administered 2021-02-14: 4000 [IU] via INTRAVENOUS
  Filled 2021-02-14: qty 4000

## 2021-02-14 MED ORDER — CHLORTHALIDONE 50 MG PO TABS
50.0000 mg | ORAL_TABLET | Freq: Every day | ORAL | Status: DC
  Administered 2021-02-15 – 2021-02-17 (×3): 50 mg via ORAL
  Filled 2021-02-14 (×3): qty 1

## 2021-02-14 MED ORDER — INSULIN ASPART 100 UNIT/ML ~~LOC~~ SOLN
0.0000 [IU] | Freq: Three times a day (TID) | SUBCUTANEOUS | Status: DC
  Administered 2021-02-15 – 2021-02-16 (×2): 2 [IU] via SUBCUTANEOUS
  Administered 2021-02-16: 8 [IU] via SUBCUTANEOUS
  Administered 2021-02-16: 3 [IU] via SUBCUTANEOUS
  Administered 2021-02-17: 2 [IU] via SUBCUTANEOUS
  Administered 2021-02-17: 3 [IU] via SUBCUTANEOUS

## 2021-02-14 MED ORDER — EMPAGLIFLOZIN 25 MG PO TABS
25.0000 mg | ORAL_TABLET | Freq: Every day | ORAL | Status: DC
  Administered 2021-02-15 – 2021-02-16 (×2): 25 mg via ORAL
  Filled 2021-02-14 (×3): qty 1

## 2021-02-14 MED ORDER — NITROGLYCERIN IN D5W 200-5 MCG/ML-% IV SOLN
2.0000 ug/min | INTRAVENOUS | Status: DC
  Administered 2021-02-14: 5 ug/min via INTRAVENOUS
  Administered 2021-02-14: 10 ug/min via INTRAVENOUS
  Filled 2021-02-14: qty 250

## 2021-02-14 MED ORDER — ONDANSETRON HCL 4 MG/2ML IJ SOLN: 4.0000 mg | Freq: Four times a day (QID) | INTRAMUSCULAR | Status: DC | PRN

## 2021-02-14 MED ORDER — ESTRADIOL 1 MG PO TABS
1.0000 mg | ORAL_TABLET | Freq: Every day | ORAL | Status: DC
  Administered 2021-02-15 – 2021-02-17 (×3): 1 mg via ORAL
  Filled 2021-02-14 (×3): qty 1

## 2021-02-14 MED ORDER — UMECLIDINIUM BROMIDE 62.5 MCG/INH IN AEPB
1.0000 | INHALATION_SPRAY | Freq: Every day | RESPIRATORY_TRACT | Status: DC
  Administered 2021-02-15 – 2021-02-17 (×3): 1 via RESPIRATORY_TRACT
  Filled 2021-02-14: qty 7

## 2021-02-14 MED ORDER — ASPIRIN EC 81 MG PO TBEC
81.0000 mg | DELAYED_RELEASE_TABLET | Freq: Every day | ORAL | Status: DC
  Administered 2021-02-15 – 2021-02-16 (×2): 81 mg via ORAL
  Filled 2021-02-14: qty 1

## 2021-02-14 MED ORDER — INSULIN DEGLUDEC 100 UNIT/ML ~~LOC~~ SOPN: 20.0000 [IU] | PEN_INJECTOR | Freq: Every day | SUBCUTANEOUS | Status: DC

## 2021-02-14 MED ORDER — TOPIRAMATE 25 MG PO TABS
50.0000 mg | ORAL_TABLET | Freq: Two times a day (BID) | ORAL | Status: DC | PRN
Start: 2021-02-14 — End: 2021-02-18
  Filled 2021-02-14: qty 2

## 2021-02-14 MED ORDER — FLUTICASONE-UMECLIDIN-VILANT 100-62.5-25 MCG/INH IN AEPB: 1.0000 | INHALATION_SPRAY | Freq: Every day | RESPIRATORY_TRACT | Status: DC

## 2021-02-14 MED ORDER — ALBUTEROL SULFATE (2.5 MG/3ML) 0.083% IN NEBU: 2.5000 mg | INHALATION_SOLUTION | Freq: Three times a day (TID) | RESPIRATORY_TRACT | Status: DC | PRN

## 2021-02-14 MED ORDER — HEPARIN (PORCINE) 25000 UT/250ML-% IV SOLN
1200.0000 [IU]/h | INTRAVENOUS | Status: DC
  Administered 2021-02-14: 1150 [IU]/h via INTRAVENOUS
  Administered 2021-02-15 – 2021-02-17 (×3): 1250 [IU]/h via INTRAVENOUS
  Filled 2021-02-14 (×4): qty 250

## 2021-02-14 MED ORDER — RISPERIDONE 0.5 MG PO TABS
0.5000 mg | ORAL_TABLET | Freq: Every day | ORAL | Status: DC
  Administered 2021-02-14 – 2021-02-16 (×3): 0.5 mg via ORAL
  Filled 2021-02-14 (×4): qty 1

## 2021-02-14 MED ORDER — NITROGLYCERIN 0.4 MG SL SUBL: 0.4000 mg | SUBLINGUAL_TABLET | SUBLINGUAL | Status: DC | PRN

## 2021-02-14 MED ORDER — ROSUVASTATIN CALCIUM 20 MG PO TABS
40.0000 mg | ORAL_TABLET | Freq: Every day | ORAL | Status: DC
  Administered 2021-02-14 – 2021-02-17 (×4): 40 mg via ORAL
  Filled 2021-02-14 (×4): qty 2

## 2021-02-14 MED ORDER — ACETAMINOPHEN 325 MG PO TABS: 650.0000 mg | ORAL_TABLET | ORAL | Status: DC | PRN

## 2021-02-14 MED ORDER — INSULIN ASPART 100 UNIT/ML ~~LOC~~ SOLN: 5.0000 [IU] | Freq: Three times a day (TID) | SUBCUTANEOUS | Status: DC

## 2021-02-14 MED ORDER — ADULT MULTIVITAMIN W/MINERALS CH
1.0000 | ORAL_TABLET | Freq: Every day | ORAL | Status: DC
  Administered 2021-02-15 – 2021-02-16 (×2): 1 via ORAL
  Filled 2021-02-14 (×2): qty 1

## 2021-02-14 MED ORDER — INSULIN ASPART 100 UNIT/ML ~~LOC~~ SOLN
5.0000 [IU] | Freq: Three times a day (TID) | SUBCUTANEOUS | Status: DC
  Administered 2021-02-15 – 2021-02-16 (×3): 5 [IU] via SUBCUTANEOUS

## 2021-02-14 MED ORDER — FUROSEMIDE 40 MG PO TABS
40.0000 mg | ORAL_TABLET | Freq: Every day | ORAL | Status: DC
  Administered 2021-02-15 – 2021-02-16 (×2): 40 mg via ORAL
  Filled 2021-02-14 (×2): qty 1

## 2021-02-14 MED ORDER — ALBUTEROL SULFATE HFA 108 (90 BASE) MCG/ACT IN AERS: 2.0000 | INHALATION_SPRAY | Freq: Four times a day (QID) | RESPIRATORY_TRACT | Status: DC | PRN

## 2021-02-14 MED ORDER — SITAGLIPTIN PHOS-METFORMIN HCL 50-500 MG PO TABS: 1.0000 | ORAL_TABLET | Freq: Two times a day (BID) | ORAL | Status: DC

## 2021-02-14 MED ORDER — LINAGLIPTIN 5 MG PO TABS
5.0000 mg | ORAL_TABLET | Freq: Every day | ORAL | Status: DC
  Administered 2021-02-15 – 2021-02-16 (×2): 5 mg via ORAL
  Filled 2021-02-14 (×2): qty 1

## 2021-02-14 MED ORDER — BENAZEPRIL HCL 20 MG PO TABS
20.0000 mg | ORAL_TABLET | Freq: Every day | ORAL | Status: DC
  Administered 2021-02-15 – 2021-02-17 (×3): 20 mg via ORAL
  Filled 2021-02-14 (×3): qty 1

## 2021-02-14 MED ORDER — FLUTICASONE FUROATE-VILANTEROL 100-25 MCG/INH IN AEPB
1.0000 | INHALATION_SPRAY | Freq: Every day | RESPIRATORY_TRACT | Status: DC
  Administered 2021-02-15 – 2021-02-17 (×3): 1 via RESPIRATORY_TRACT
  Filled 2021-02-14: qty 28

## 2021-02-14 MED ORDER — INSULIN ASPART 100 UNIT/ML ~~LOC~~ SOLN: 0.0000 [IU] | Freq: Every day | SUBCUTANEOUS | Status: DC

## 2021-02-14 NOTE — H&P (Signed)
Cardiology Admission History and Physical:   Patient ID: Amber Harris MRN: [id number]; DOB: 02/26/1961   Admission date: 02/14/2021  Primary Care Provider: Toma Deiters, MD Primary Cardiologist: Dina Rich, MD  Primary Electrophysiologist:  None   Chief Complaint:  Chest pain, bradycardia  Patient Profile:   Amber Harris is a 60 y.o. female with history of asthma, ASD, COPD, depression, DM, CAD, stroke, HTN presenting with chest pain and bradycardia.    History of Present Illness:   Ms. Doung reports sudden onset chest pain starting earlier today. She had just finished some housework when she noticed this start. Pain was center of the chest and described as sharp. Non-radiating. No pain in jaw or arm. No abdominal pain. She states this felt just like a heart attack she had in previous years, although doesn't remember ever having a heart cath or a stent placed. She came to an OSH and noted to have nonspecific ST abnormalities on ECG. An initial troponin was 10 and a 2 hour delta was 0. Her d-dimer was elevated and a CTA was performed which was negative for PE. A follow up ECG revealed sinus bradycardia with HR 46 that was not initially present on admission. She notably did not have any known history of bradycardia or arrhythmia. She was transferred to [facility] for further workup and treatment.   Currently the patient states that her chest pain has started to come back. She again describes this as central chest pain, stabbing and sharp in character. It is rated at around a 4 out of 10. The pain had nearly resolved earlier in the day when she was given nitroglycerin.   The patient also notes she has a longstanding history of dizziness. She has had 2 episodes of syncope that she states both occurred earlier this year, however review of her chart reveals a longstanding diagnosis of exertional syncope for which she was first worked up in 2012. She lost consciousness for  both of these and injured herself during one of these episodes. She underwent a stress test, echo and holter monitor for workup, which were largely unremarkable.   Also notably patient states she has a prior history of MI approximately 5 or 6 years ago, although she does not recall ever having undergone coronary angiography for this. I am unable to find any prior angiograms in her chart.    Heart Pathway Score:  HEAR Score: 3  Past Medical History:  Diagnosis Date  . Aortic valve disease    Long-standing systolic murmur  . Asthma    Uses p.r.n. albuterol  . Bronchitis   . COPD (chronic obstructive pulmonary disease) (HCC)   . Degenerative joint disease    right knee  . Depression   . Depression with anxiety   . Diabetes mellitus   . Dyspnea    exertion  . Dyspnea on exertion    poor exercise tolerance  . Fibroids    uterine; postmenopausal bleeding  . GERD (gastroesophageal reflux disease)   . Headache(784.0)   . Hyperlipidemia   . Hypertension   . Myocardial infarction (HCC)   . Obesity   . Palpitations   . Stroke [facility])    TIA - left side residual  . Syncope    exertional  . Tobacco user    30 pack years; 04/2011: 1/4 pack per day during quick attempt    Past Surgical History:  Procedure Laterality Date  . ABDOMINAL HYSTERECTOMY  12/21/2011   Procedure: HYSTERECTOMY  ABDOMINAL;  Surgeon: Tilda Burrow, MD;  Location: [facility];  Service: Gynecology;  Laterality: N/A;  . CATARACT EXTRACTION W/PHACO Left 10/03/2020   Procedure: CATARACT EXTRACTION PHACO AND INTRAOCULAR LENS PLACEMENT LEFT EYE;  Surgeon: Fabio Pierce, MD;  Location: [facility];  Service: Ophthalmology;  Laterality: Left;  CDE 5.04  . CATARACT EXTRACTION W/PHACO Right 10/17/2020   Procedure: CATARACT EXTRACTION PHACO AND INTRAOCULAR LENS PLACEMENT RIGHT EYE;  Surgeon: Fabio Pierce, MD;  Location: [facility];  Service: Ophthalmology;  Laterality: Right;  CDE: 4.60  . TUBAL LIGATION       Medications Prior to  Admission: Prior to Admission medications   Medication Sig Start Date End Date Taking? Authorizing Provider  aspirin EC 81 MG tablet Take 81 mg by mouth daily.   Yes [provider]  benazepril (LOTENSIN) 20 MG tablet Take 20 mg by mouth daily.   Yes [provider]  chlorthalidone (HYGROTON) 50 MG tablet Take 50 mg by mouth daily.   Yes [provider]  cloNIDine (CATAPRES) 0.3 MG tablet Take 0.3 mg by mouth 3 (three) times daily. 12/30/20  Yes [provider]  estradiol (ESTRACE) 1 MG tablet Take 1 mg by mouth daily.   Yes [provider]  Fluticasone-Umeclidin-Vilant (TRELEGY ELLIPTA) 100-62.5-25 MCG/INH AEPB Inhale 1 puff into the lungs daily.   Yes [provider]  furosemide (LASIX) 40 MG tablet Take 1 tab daily as needed for swelling Patient taking differently: Take 40 mg by mouth daily. 09/11/15  Yes Branch, Dorothe Pea, MD  insulin aspart (NOVOLOG) 100 UNIT/ML injection Inject 5 Units into the skin 3 (three) times daily after meals.   Yes [provider]  insulin degludec (TRESIBA FLEXTOUCH) 100 UNIT/ML FlexTouch Pen Inject 20 Units into the skin at bedtime.   Yes [provider]  JANUMET 50-500 MG tablet Take 1 tablet by mouth 2 (two) times daily. 12/26/20  Yes [provider]  JARDIANCE 25 MG TABS tablet Take 25 mg by mouth daily.  03/24/16  Yes [provider]  methocarbamol (ROBAXIN) 500 MG tablet Take 500 mg by mouth at bedtime. 01/14/21  Yes [provider]  Multiple Vitamins-Minerals (MULTIVITAMIN ADULT) CHEW Chew 1 tablet by mouth daily.   Yes [provider]  risperiDONE (RISPERDAL) 0.5 MG tablet Take 0.5 mg by mouth at bedtime.   Yes [provider]  rosuvastatin (CRESTOR) 40 MG tablet Take 1 tablet (40 mg total) by mouth daily. 10/14/20  Yes BranchDorothe Pea, MD  verapamil (CALAN-SR) 240 MG CR tablet Take 240 mg by mouth daily.   Yes [provider]   albuterol (PROVENTIL) (5 MG/ML) 0.5% nebulizer solution Take 2.5 mg by nebulization 3 (three) times daily as needed for wheezing or shortness of breath.     [provider]  albuterol (PROVENTIL,VENTOLIN) 90 MCG/ACT inhaler Inhale 2 puffs into the lungs every 6 (six) hours as needed for wheezing or shortness of breath.     [provider]  topiramate (TOPAMAX) 50 MG tablet Take 50 mg by mouth 2 (two) times daily as needed (headaches).    [provider]     Allergies:    Allergies  Allergen Reactions  . Aspirin Hives and Itching  . Penicillins Rash    Social History:   Social History   Socioeconomic History  . Marital status: Single    Spouse name: Not on file  . Number of children: 4  . Years of education: Not on file  .  Highest education level: Not on file  Occupational History  . Occupation: unemployed  Tobacco Use  . Smoking status: Light Tobacco Smoker    Packs/day: 0.50    Years: 30.00    Pack years: 15.00    Types: Cigarettes    Start date: 10/02/1974  . Smokeless tobacco: Never Used  . Tobacco comment: started back about a month ago - 3-4/day - smokes marijuana  for pain   Substance and Sexual Activity  . Alcohol use: No    Alcohol/week: 0.0 standard drinks  . Drug use: Yes    Types: Marijuana  . Sexual activity: Not Currently    Birth control/protection: Surgical  Other Topics Concern  . Not on file  Social History Narrative   Lives with friend (21 years) in a trailer   Patient has a GED.   Patient is disabled.   Patient is right handed.   Not Drinking caffeine currently.   Social Determinants of Health   Financial Resource Strain: Not on file  Food Insecurity: Not on file  Transportation Needs: Not on file  Physical Activity: Not on file  Stress: Not on file  Social Connections: Not on file  Intimate Partner Violence: Not on file    Family History:   The patient's family history includes Breast cancer in her sister;  Cancer in her cousin; Heart disease in her brother and maternal aunt; Lung cancer in her mother. There is no history of Anesthesia problems, Hypotension, Malignant hyperthermia, or Pseudochol deficiency.    ROS:  Please see the history of present illness.  All other ROS reviewed and negative.     Physical Exam/Data:   Vitals:   02/14/21 1847 02/14/21 1949  BP: (!) 172/62 (!) 164/71  Pulse: (!) 51 (!) 50  Resp: 17 18  Temp: 98.1 F (36.7 C) 98.1 F (36.7 C)  TempSrc: Oral   SpO2: 100% 100%    Intake/Output Summary (Last 24 hours) at 02/14/2021 2046 Last data filed at 02/14/2021 2000 Gross per 24 hour  Intake 0 ml  Output --  Net 0 ml   Last 3 Weights 10/01/2020 12/20/2019 06/10/2016  Weight (lbs) 252 lb 268 lb 9.6 oz 261 lb 6.4 oz  Weight (kg) 114.306 kg 121.836 kg 118.57 kg     There is no height or weight on file to calculate BMI.  General:  Well nourished, well developed, in no acute distress HEENT: normal Neck: no JVD Cardiac:  Bradycardic, normal S1/S2, soft systolic murmur across precordium Lungs:  clear to auscultation bilaterally, no wheezing, rhonchi or rales  Abd: soft, nontender, no hepatomegaly  Ext: trace bilateral edema  EKG:  - ECG at OSH initially reveals NSR w HR 82 and anterior Q waves but no acute ST or T wave abnormalities - Follow up ECG at OSH reveals sinus bradycardia, HR 46, no other acute abnormalities  Relevant CV Studies: Echo from 2019 at [facility] w LVEF normal, mild AI, no MR  Holter from 2012 w occasional sinus brady and HR's in 40's.   Laboratory Data:  High Sensitivity Troponin:  No results for input(s): TROPONINIHS in the last 720 hours.    ChemistryNo results for input(s): NA, K, CL, CO2, GLUCOSE, BUN, CREATININE, CALCIUM, GFRNONAA, GFRAA, ANIONGAP in the last 168 hours.  No results for input(s): PROT, ALBUMIN, AST, ALT, ALKPHOS, BILITOT in the last 168 hours. HematologyNo results for input(s): WBC, RBC, HGB, HCT, MCV, MCH, MCHC, RDW,  PLT in the last 168 hours. BNPNo results for  input(s): BNP, PROBNP in the last 168 hours.  DDimer No results for input(s): DDIMER in the last 168 hours.   Radiology/Studies:  No results found.    HEAR Score (for undifferentiated chest pain):  HEAR Score: 3   Assessment and Plan:  SO Amber Harris is a 60 y.o. female with history of asthma, ASD, COPD, depression, DM, CAD, stroke, HTN presenting with chest pain and bradycardia.    #) Chest pain: patient has many risk factors for coronary disease and states that she had a prior MI, however I am not able to find any supporting documentation in the chart. There are some atypical features of her symptoms, however it is concerning that her pain has now returned after arrival to [facility]. Her HEAR score is low and her troponin is reassuring, thus a non-invasive evaluation for ischemia would be reasonable, however given her many risk factors for coronary disease and ongoing symptoms, will start heparin to treat as ACS and tentatively plan for invasive angiography on Monday.  - repeat troponin x 2 - echo in AM - check lipids, A1c - ASA 324mg  then 81mg  daily (confirmed that patient has been receiving this despite having listed as an allergy and is doing fine) - start heparin drip for ACS per pharmacy protocol - cont home rosuvastatin 40mg  daily - SLN, nitro gtt  - cardiac rehab - cont home lasix daily  #) Bradycardia: review of patient's chart reveals that patient has had bradycardia with HR's in the 50's for at least several years now. She does describe frequent dizziness and several episodes of syncope, however unclear why she would have syncope in the setting of chronic resting bradycardia without any other evidence of conduction system disease.  - EP evaluation in AM - would be worth setting patient up with rhythm monitor, possibly ILR, to try to correlate symptoms with HR trend - stop verapamil for now  #) HTN: - cont home  benazepril, chlorthalidone, clonidine, furosemide - STOP verapamil given bradycardia as per above  #) Diabetes: - check A1c - cont home jardiance - cont home Janumet (ordered as linagliptin 5mg  daily and metformin 500mg  BID) - cont home degludec 20u nightly - cont home aspart 5u AC - ISS AC & HS - would consult endocrinology if issues with hyper/hypoglycemia in hospital  Severity of Illness: The appropriate patient status for this patient is INPATIENT. Inpatient status is judged to be reasonable and necessary in order to provide the required intensity of service to ensure the patient's safety. The patient's presenting symptoms, physical exam findings, and initial radiographic and laboratory data in the context of their chronic comorbidities is felt to place them at high risk for further clinical deterioration. Furthermore, it is not anticipated that the patient will be medically stable for discharge from the hospital within 2 midnights of admission. The following factors support the patient status of inpatient.   " The patient's presenting symptoms include chest pain. " The worrisome physical exam findings include bradycardia. " The initial radiographic and laboratory data are worrisome because of bradycardia. " The chronic co-morbidities include obesity, DM, HTN.  * I certify that at the point of admission it is my clinical judgment that the patient will require inpatient hospital care spanning beyond 2 midnights from the point of admission due to high intensity of service, high risk for further deterioration and high frequency of surveillance required.*   For questions or updates, please contact [facility] Please consult [email or web address] for contact info under  Signed, Rosario Jacks, MD  02/14/2021 8:46 PM

## 2021-02-14 NOTE — Progress Notes (Signed)
ANTICOAGULATION CONSULT NOTE - Initial Consult  Pharmacy Consult for heparin Indication: chest pain/ACS  Allergies  Allergen Reactions  . Aspirin Hives and Itching  . Penicillins Rash    Patient Measurements: Actual body weight: 111 kg (patient reported)  Heparin Dosing Weight: 91 kg   Vital Signs: Temp: 98.1 F (36.7 C) (03/19 1949) Temp Source: Oral (03/19 1847) BP: 164/71 (03/19 1949) Pulse Rate: 50 (03/19 1949)  Labs: No results for input(s): HGB, HCT, PLT, APTT, LABPROT, INR, HEPARINUNFRC, HEPRLOWMOCWT, CREATININE, CKTOTAL, CKMB, TROPONINIHS in the last 72 hours.  CrCl cannot be calculated (Patient's most recent lab result is older than the maximum 21 days allowed.).   Medical History: Past Medical History:  Diagnosis Date  . Aortic valve disease    Long-standing systolic murmur  . Asthma    Uses p.r.n. albuterol  . Bronchitis   . COPD (chronic obstructive pulmonary disease) ([place])   . Degenerative joint disease    right knee  . Depression   . Depression with anxiety   . Diabetes mellitus   . Dyspnea    exertion  . Dyspnea on exertion    poor exercise tolerance  . Fibroids    uterine; postmenopausal bleeding  . GERD (gastroesophageal reflux disease)   . Headache(784.0)   . Hyperlipidemia   . Hypertension   . Myocardial infarction ([place])   . Obesity   . Palpitations   . Stroke [facility])    TIA - left side residual  . Syncope    exertional  . Tobacco user    30 pack years; 04/2011: 1/4 pack per day during quick attempt    Medications:  Medications Prior to Admission  Medication Sig Dispense Refill Last Dose  . aspirin EC 81 MG tablet Take 81 mg by mouth daily.   02/14/2021 at Unknown time  . benazepril (LOTENSIN) 20 MG tablet Take 20 mg by mouth daily.   02/14/2021 at Unknown time  . chlorthalidone (HYGROTON) 50 MG tablet Take 50 mg by mouth daily.   02/14/2021 at Unknown time  . cloNIDine (CATAPRES) 0.3 MG tablet Take 0.3 mg by mouth 3 (three) times  daily.   02/14/2021 at Unknown time  . estradiol (ESTRACE) 1 MG tablet Take 1 mg by mouth daily.   02/14/2021 at Unknown time  . Fluticasone-Umeclidin-Vilant (TRELEGY ELLIPTA) 100-62.5-25 MCG/INH AEPB Inhale 1 puff into the lungs daily.   Past Month at Unknown time  . furosemide (LASIX) 40 MG tablet Take 1 tab daily as needed for swelling (Patient taking differently: Take 40 mg by mouth daily.) 90 tablet 3 02/14/2021 at Unknown time  . insulin aspart (NOVOLOG) 100 UNIT/ML injection Inject 5 Units into the skin 3 (three) times daily after meals.   02/14/2021 at Unknown time  . insulin degludec (TRESIBA FLEXTOUCH) 100 UNIT/ML FlexTouch Pen Inject 20 Units into the skin at bedtime.   02/13/2021 at Unknown time  . JANUMET 50-500 MG tablet Take 1 tablet by mouth 2 (two) times daily.   02/14/2021 at Unknown time  . JARDIANCE 25 MG TABS tablet Take 25 mg by mouth daily.    02/14/2021 at Unknown time  . methocarbamol (ROBAXIN) 500 MG tablet Take 500 mg by mouth at bedtime.   02/13/2021 at Unknown time  . Multiple Vitamins-Minerals (MULTIVITAMIN ADULT) CHEW Chew 1 tablet by mouth daily.   02/13/2021 at Unknown time  . risperiDONE (RISPERDAL) 0.5 MG tablet Take 0.5 mg by mouth at bedtime.   02/13/2021 at Unknown time  . rosuvastatin (CRESTOR)  40 MG tablet Take 1 tablet (40 mg total) by mouth daily. 30 tablet 3 02/13/2021 at Unknown time  . verapamil (CALAN-SR) 240 MG CR tablet Take 240 mg by mouth daily.   02/14/2021 at Unknown time  . albuterol (PROVENTIL) (5 MG/ML) 0.5% nebulizer solution Take 2.5 mg by nebulization 3 (three) times daily as needed for wheezing or shortness of breath.    unknoiwn  . albuterol (PROVENTIL,VENTOLIN) 90 MCG/ACT inhaler Inhale 2 puffs into the lungs every 6 (six) hours as needed for wheezing or shortness of breath.    unknown  . topiramate (TOPAMAX) 50 MG tablet Take 50 mg by mouth 2 (two) times daily as needed (headaches).   unknown    Assessment: 58 YOF who presented with chest pain and  bradycardia. Pharmacy consulted to start IV heparin for ACS.   Goal of Therapy:  Heparin level 0.3-0.7 units/ml Monitor platelets by anticoagulation protocol: Yes   Plan:  -Heparin 4000 units IV bolus followed heparin 1150 units/hr  -F/u 6 hr HL -Monitor daily HL, CBC and s/s of bleeding   Albertina Parr, PharmD., BCPS, BCCCP Clinical Pharmacist Please refer to [facility] for unit-specific pharmacist

## 2021-02-15 ENCOUNTER — Inpatient Hospital Stay (HOSPITAL_COMMUNITY): Payer: Medicare HMO

## 2021-02-15 ENCOUNTER — Other Ambulatory Visit: Payer: Self-pay

## 2021-02-15 ENCOUNTER — Encounter (HOSPITAL_COMMUNITY): Payer: Self-pay | Admitting: Internal Medicine

## 2021-02-15 DIAGNOSIS — R079 Chest pain, unspecified: Secondary | ICD-10-CM | POA: Diagnosis not present

## 2021-02-15 DIAGNOSIS — I639 Cerebral infarction, unspecified: Secondary | ICD-10-CM

## 2021-02-15 DIAGNOSIS — I6320 Cerebral infarction due to unspecified occlusion or stenosis of unspecified precerebral arteries: Secondary | ICD-10-CM

## 2021-02-15 LAB — BASIC METABOLIC PANEL
Anion gap: 9 (ref 5–15)
BUN: 10 mg/dL (ref 6–20)
CO2: 24 mmol/L (ref 22–32)
Calcium: 8.5 mg/dL — ABNORMAL LOW (ref 8.9–10.3)
Chloride: 106 mmol/L (ref 98–111)
Creatinine, Ser: 0.93 mg/dL (ref 0.44–1.00)
GFR, Estimated: >60 mL/min (ref >60)
Glucose, Bld: 165 mg/dL — ABNORMAL HIGH (ref 70–99)
Potassium: 3.7 mmol/L (ref 3.5–5.1)
Sodium: 139 mmol/L (ref 135–145)

## 2021-02-15 LAB — HIV ANTIBODY (ROUTINE TESTING W REFLEX): HIV Screen 4th Generation wRfx: NONREACTIVE

## 2021-02-15 LAB — APTT: aPTT: 61 seconds — ABNORMAL HIGH (ref 24–36)

## 2021-02-15 LAB — CBC
HCT: 35.7 % — ABNORMAL LOW (ref 36.0–46.0)
Hemoglobin: 12.1 g/dL (ref 12.0–15.0)
MCH: 31.8 pg (ref 26.0–34.0)
MCHC: 33.9 g/dL (ref 30.0–36.0)
MCV: 93.9 fL (ref 80.0–100.0)
Platelets: 136 10*3/uL — ABNORMAL LOW (ref 150–400)
RBC: 3.8 MIL/uL — ABNORMAL LOW (ref 3.87–5.11)
RDW: 12.4 % (ref 11.5–15.5)
WBC: 4.9 10*3/uL (ref 4.0–10.5)
nRBC: 0 % (ref 0.0–0.2)

## 2021-02-15 LAB — TROPONIN I (HIGH SENSITIVITY): Troponin I (High Sensitivity): 8 ng/L (ref <18)

## 2021-02-15 LAB — PROTIME-INR
INR: 1 (ref 0.8–1.2)
Prothrombin Time: 12.9 seconds (ref 11.4–15.2)

## 2021-02-15 LAB — GLUCOSE, CAPILLARY
Glucose-Capillary: 138 mg/dL — ABNORMAL HIGH (ref 70–99)
Glucose-Capillary: 76 mg/dL (ref 70–99)
Glucose-Capillary: 144 mg/dL — ABNORMAL HIGH (ref 70–99)
Glucose-Capillary: 155 mg/dL — ABNORMAL HIGH (ref 70–99)

## 2021-02-15 LAB — ECHOCARDIOGRAM COMPLETE
Area-P 1/2: 2.69 cm2
P 1/2 time: 543 msec
S' Lateral: 2.7 cm
Weight: 3977.6 oz

## 2021-02-15 LAB — HEPARIN LEVEL (UNFRACTIONATED)
Heparin Unfractionated: 0.3 IU/mL (ref 0.30–0.70)
Heparin Unfractionated: 0.41 IU/mL (ref 0.30–0.70)

## 2021-02-15 LAB — TSH: TSH: 0.544 u[IU]/mL (ref 0.350–4.500)

## 2021-02-15 MED ORDER — SODIUM CHLORIDE 0.9% FLUSH
3.0000 mL | Freq: Two times a day (BID) | INTRAVENOUS | Status: DC
  Administered 2021-02-15 – 2021-02-17 (×4): 3 mL via INTRAVENOUS

## 2021-02-15 MED ORDER — SODIUM CHLORIDE 0.9% FLUSH: 3.0000 mL | INTRAVENOUS | Status: DC | PRN

## 2021-02-15 MED ORDER — PERFLUTREN LIPID MICROSPHERE
1.0000 mL | INTRAVENOUS | Status: AC | PRN
  Administered 2021-02-15: 3 mL via INTRAVENOUS
  Filled 2021-02-15: qty 10

## 2021-02-15 MED ORDER — SODIUM CHLORIDE 0.9 % WEIGHT BASED INFUSION
1.0000 mL/kg/h | INTRAVENOUS | Status: DC
  Administered 2021-02-16: 1 mL/kg/h via INTRAVENOUS

## 2021-02-15 MED ORDER — SODIUM CHLORIDE 0.9 % IV SOLN: 250.0000 mL | INTRAVENOUS | Status: DC | PRN

## 2021-02-15 MED ORDER — ASPIRIN 81 MG PO CHEW
81.0000 mg | CHEWABLE_TABLET | ORAL | Status: AC
Start: 2021-02-16 — End: 2021-02-16
  Administered 2021-02-16: 81 mg via ORAL
  Filled 2021-02-15: qty 1

## 2021-02-15 MED ORDER — IOHEXOL 350 MG/ML SOLN
100.0000 mL | Freq: Once | INTRAVENOUS | Status: AC | PRN
  Administered 2021-02-15: 100 mL via INTRAVENOUS

## 2021-02-15 MED ORDER — SODIUM CHLORIDE 0.9 % WEIGHT BASED INFUSION: 3.0000 mL/kg/h | INTRAVENOUS | Status: AC

## 2021-02-15 NOTE — Plan of Care (Signed)

## 2021-02-15 NOTE — Progress Notes (Addendum)
   Called to see pt. She has been more lethargic since lunch time today. EKG obtained: sinus brady, HR 44, no acute changes  Pt notes R eye vision changes since this morning.  Nursing staff has noted she has been lethargic since around 2pm.   She is easy to arouse.  She continues to have chest heaviness at times.  She denies headache.   Exam BP 153/68, P 50, O2 100%, glucose 144  No acute distress CNs II-XII grossly intact Normal active ROM all extremities Cor: RRR Lungs: CTA Skin: Warm and dry  PLAN:  Concerned about neuro event. Hold Heparin for now Stat head CT  Code stroke called. Discussed with patient's son. Richardson Dopp, PA-C    02/15/2021 4:46 PM

## 2021-02-15 NOTE — Progress Notes (Signed)
ANTICOAGULATION CONSULT NOTE - Follow Up Consult  Pharmacy Consult for heparin Indication: chest pain/ACS  Labs: Recent Labs    02/14/21 2044 02/14/21 2304 02/15/21 0214  HGB  --   --  12.1  HCT  --   --  35.7*  PLT  --   --  136*  HEPARINUNFRC  --   --  0.30  CREATININE 0.94  --  0.93  TROPONINIHS 7 8  --     Assessment: 60yo female therapeutic on heparin though lab was drawn just 4h after bolus and suspect level will drop; no gtt issues or signs of bleeding per RN.  Goal of Therapy:  Heparin level 0.3-0.7 units/ml   Plan:  Will increase heparin gtt by 1 unit/kg/hr to 1250 units/hr and check level in 6 hours.    Wynona Neat, PharmD, BCPS  02/15/2021,3:12 AM

## 2021-02-15 NOTE — Code Documentation (Signed)
Received a call from RN with concerns that her patient is more lethargic than normal.  Upon entering the patient's room it was present that she was turning her head to the left side to engage in conversation.  When asked about this the patient remarked that she has had decreased and blurry vision in her left eye since breakfast this morning. Code stroke called 1633.  Breakfast trays are usually on unit 0700-0800 and LKW is being called for 0730.  Patient is on heparin gtt and is out of the treatment window so she is ineligible for TPA.  Treatment decision was made at 1653 for q2 neuro checks.  NIHSS 5

## 2021-02-15 NOTE — Progress Notes (Signed)
  Echocardiogram 2D Echocardiogram has been performed with Definity.  Amber Harris 02/15/2021, 2:08 PM

## 2021-02-15 NOTE — Progress Notes (Signed)
  Plan cardiac catheterization tomorrow. Metformin DC'd for cath. Will need to restart after cardiac catheterization. Richardson Dopp, PA-C    02/15/2021 12:43 PM

## 2021-02-15 NOTE — Progress Notes (Signed)
Pt unusually drowsy from this morning. Pt is able to answer questions, however, slow to respond. MD called,  arrived at bedside. Code stroke initiated.

## 2021-02-15 NOTE — Progress Notes (Addendum)
Progress Note  Patient Name: Amber Harris Date of Encounter: 02/15/2021  Primary Cardiologist: Carlyle Dolly, MD  Subjective   No chest pain or shortness of breath.  Inpatient Medications    Scheduled Meds: . aspirin EC  81 mg Oral Daily  . benazepril  20 mg Oral Daily  . chlorthalidone  50 mg Oral Daily  . cloNIDine  0.3 mg Oral TID  . empagliflozin  25 mg Oral Daily  . estradiol  1 mg Oral Daily  . fluticasone furoate-vilanterol  1 puff Inhalation Daily  . furosemide  40 mg Oral Daily  . insulin aspart  0-15 Units Subcutaneous TID WC  . insulin aspart  0-5 Units Subcutaneous QHS  . insulin aspart  5 Units Subcutaneous TID WC  . insulin glargine  20 Units Subcutaneous QHS  . linagliptin  5 mg Oral Daily  . metFORMIN  500 mg Oral BID WC  . methocarbamol  500 mg Oral QHS  . multivitamin with minerals  1 tablet Oral Daily  . risperiDONE  0.5 mg Oral QHS  . rosuvastatin  40 mg Oral Daily  . umeclidinium bromide  1 puff Inhalation Daily   Continuous Infusions: . heparin 1,250 Units/hr (02/15/21 0510)  . nitroGLYCERIN 10 mcg/min (02/15/21 0510)   PRN Meds: acetaminophen, albuterol, albuterol, nitroGLYCERIN, ondansetron (ZOFRAN) IV, topiramate   Vital Signs    Vitals:   02/14/21 1847 02/14/21 1949 02/15/21 0436 02/15/21 1122  BP: (!) 172/62 (!) 164/71 132/61 128/69  Pulse: (!) 51 (!) 50 (!) 52 (!) 48  Resp: 17 18 16 16   Temp: 98.1 F (36.7 C) 98.1 F (36.7 C) 98.2 F (36.8 C) 98 F (36.7 C)  TempSrc: Oral  Oral Oral  SpO2: 100% 100% 100%   Weight:   112.8 kg     Intake/Output Summary (Last 24 hours) at 02/15/2021 1231 Last data filed at 02/15/2021 0900 Gross per 24 hour  Intake 656.29 ml  Output 250 ml  Net 406.29 ml   Filed Weights   02/15/21 0436  Weight: 112.8 kg    Telemetry    Sinus rhythm.  Personally reviewed.  ECG    An ECG dated 02/15/2021 was personally reviewed today and demonstrated:  Sinus bradycardia with decreased R wave  progression and anterolateral repolarization abnormalities that are old in comparison to prior tracings.  Physical Exam   GEN:  Obese, no acute distress.   Neck: No JVD. Cardiac: RRR, 2/6 basal systolic murmur, rub, or gallop.  Respiratory: Nonlabored. Clear to auscultation bilaterally. GI: Soft, nontender, bowel sounds present. MS: No edema; No deformity. Neuro:  Nonfocal. Psych: Alert and oriented x 3. Normal affect.  Labs    Chemistry Recent Labs  Lab 02/14/21 2044 02/15/21 0214  NA 139 139  K 3.4* 3.7  CL 105 106  CO2 27 24  GLUCOSE 84 165*  BUN 6 10  CREATININE 0.94 0.93  CALCIUM 9.1 8.5*  PROT 7.5  --   ALBUMIN 3.5  --   AST 28  --   ALT 26  --   ALKPHOS 81  --   BILITOT 1.5*  --   GFRNONAA >60 >60  ANIONGAP 7 9     Hematology Recent Labs  Lab 02/15/21 0214  WBC 4.9  RBC 3.80*  HGB 12.1  HCT 35.7*  MCV 93.9  MCH 31.8  MCHC 33.9  RDW 12.4  PLT 136*    Cardiac Enzymes Recent Labs  Lab 02/14/21 2044 02/14/21 2304  TROPONINIHS 7  8    BNP Recent Labs  Lab 02/14/21 2044  BNP 70.1     Radiology    No results found.  Patient Profile     60 y.o. female with a history of mild aortic stenosis as of 2019, tobacco use, previous stroke, hypertension, type 2 diabetes mellitus, chronic bradycardia, and reported history of previous myocardial infarction now presenting with chest discomfort  Assessment & Plan    1.  Unstable angina.  Patient reports recent onset exertional chest tightness with activities around the house, symptom onset yesterday.  High-sensitivity troponin I levels were normal at this time.  ECG chronically abnormal with repolarization abnormalities anterolaterally and decreased R wave progression.  2.  Chronic sinus bradycardia per chart review.  Not clearly symptomatic at this time.  She has been on verapamil as an outpatient for treatment of palpitations, no documented arrhythmias.  3.  Aortic stenosis, mild by outside  echocardiogram in 2019.  4.  Reported history of previous "heart attack."  Details were not clear.  Patient denies any prior cardiac catheterization or PCI.  5.  Previous history of stroke/TIA.  6.  Type 2 diabetes mellitus on multimodal treatment as per history and physical documentation.  Hemoglobin A1c 6.3%.  7.  Question aspirin allergy.  Listed in chart as "hives."  Patient states however that she has taken aspirin for several years as an outpatient.  It initially made her "itchy" but she tolerates it now.  She is currently on aspirin 81 mg daily.  Discussed symptoms with patient.  We reviewed the risks and benefits of a diagnostic cardiac catheterization and she is in agreement to proceed.  Procedure will be added on for tomorrow.  Continue aspirin, Lotensin, HCTZ, clonidine, Crestor, IV nitroglycerin, and IV heparin.  Follow-up CBC, BMET, and ECG in a.m.  Signed, Rozann Lesches, MD  02/15/2021, 12:31 PM

## 2021-02-15 NOTE — Progress Notes (Signed)
CT/CTA/CT perfusion all negative. tPA contraindicated 2/2 anticoagulation on heparin gtt. No intervention indicated 2/2 no LVO. Will order stroke w/u and stroke team will follow up with patient tomorrow.  Full stroke code consult note to follow.  Su Monks, MD [facility] [phone]  If 7pm- 7am, please page neurology on call as listed in [place].

## 2021-02-15 NOTE — Plan of Care (Signed)
Pt admitted for cp but has not complained of any since admission. Started on heparin and nitro gtt per md orders and tolerating well. No acute events. No acute distress noted.    Problem: Education: Goal: Knowledge of General Education information will improve Description: Including pain rating scale, medication(s)/side effects and non-pharmacologic comfort measures Outcome: Progressing   Problem: Health Behavior/Discharge Planning: Goal: Ability to manage health-related needs will improve Outcome: Progressing   Problem: Clinical Measurements: Goal: Ability to maintain clinical measurements within normal limits will improve Outcome: Progressing Goal: Will remain free from infection Outcome: Progressing Goal: Diagnostic test results will improve Outcome: Progressing Goal: Respiratory complications will improve Outcome: Progressing Goal: Cardiovascular complication will be avoided Outcome: Progressing   Problem: Activity: Goal: Risk for activity intolerance will decrease Outcome: Progressing   Problem: Nutrition: Goal: Adequate nutrition will be maintained Outcome: Progressing   Problem: Coping: Goal: Level of anxiety will decrease Outcome: Progressing   Problem: Elimination: Goal: Will not experience complications related to bowel motility Outcome: Progressing Goal: Will not experience complications related to urinary retention Outcome: Progressing   Problem: Pain Managment: Goal: General experience of comfort will improve Outcome: Progressing   Problem: Safety: Goal: Ability to remain free from injury will improve Outcome: Progressing   Problem: Skin Integrity: Goal: Risk for impaired skin integrity will decrease Outcome: Progressing

## 2021-02-15 NOTE — Progress Notes (Signed)
ANTICOAGULATION CONSULT NOTE - Follow Up Consult  Pharmacy Consult for heparin Indication: chest pain/ACS  Labs: Recent Labs    02/14/21 2044 02/14/21 2304 02/15/21 0214 02/15/21 0845  HGB  --   --  12.1  --   HCT  --   --  35.7*  --   PLT  --   --  136*  --   HEPARINUNFRC  --   --  0.30 0.41  CREATININE 0.94  --  0.93  --   TROPONINIHS 7 8  --   --     Assessment: 12 YOF who presented with chest pain and bradycardia. Pharmacy consulted to start IV heparin for ACS.   Heparin level therapeutic at 0.41 after rate increase to 1250 units/hr this morning. Hgb wnl, plt 136. No bleeding noted.   Goal of Therapy:  Heparin level 0.3-0.7 units/ml    Plan:  Continue heparin infusion at 1250 units/hr Monitor daily heparin level, CBC, s/s bleeding  Rebbeca Paul, PharmD PGY1 Pharmacy Resident 02/15/2021 9:18 AM  Please check [email or web address] for unit-specific pharmacy phone numbers.

## 2021-02-15 NOTE — Consult Note (Signed)
Neurology consult  CC: in house [place]  History is obtained from: RN  HPI: Amber Harris is a 60 y.o. female with extensive PMHx including HTN, HLD, IDDM II, TIA/stroke, remote tobacco abuse, asthma, syncope, aortic stenosis, COPD, depression, CAD s/p MI who was admitted one day ago with chest pain and bradycardia. Admitted to cardiology service and cardiac cath was planned for tomorrow. Today, at 1400 hours, she told her RN that she had been having blurry vision in left eye since breakfast. Breakfast is usually between 0700-0800, so LKW is estimated at 0730. Code stroke was called. Patient found to have lack of vision in OS with left sided neglect. No other focal neurological symptoms.   NP/MD met patient in CT suite. She is on Heparin for cardiac cath tomorrow, so not a candidate for tPA. MD spoke to cardiologist who stated we could do CTA head and neck and they would post pone cardiac cath.   CTH, CTA head and neck and CT perfusion results are without acute infarct, or LVO (results below). Patient transported back to her room by staff. She will remain under cardiology care with neurology as consult. Will get a complete stroke workup.    Patient's HbA1c was 6.3% this hospitalization.  LKW: 0730 tpa given?: No, patient is on Heparin drip IR Thrombectomy? No   NIHSS:  1a Level of Conscious: 0 1b LOC Questions:0  1c LOC Commands: 0 2 Best Gaze: 1 3 Visual: 2 4 Facial Palsy: 0 5a Motor Arm - left: 0 5b Motor Arm - Right: 0 6a Motor Leg - Left: 0 6b Motor Leg - Right: 0 7 Limb Ataxia: 0 8 Sensory: 1 9 Best Language: 0 10 Dysarthria: 1 11 Extinct. and Inatten: 2 TOTAL: 7  ROS: Complete ROS was unable to be completed due to emergent nature of event.   Past Medical History:  Diagnosis Date  . Aortic valve disease    Long-standing systolic murmur  . Asthma    Uses p.r.n. albuterol  . Bronchitis   . COPD (chronic obstructive pulmonary disease) ([place])   . Degenerative  joint disease    right knee  . Depression   . Depression with anxiety   . Diabetes mellitus   . Dyspnea    exertion  . Dyspnea on exertion    poor exercise tolerance  . Fibroids    uterine; postmenopausal bleeding  . GERD (gastroesophageal reflux disease)   . Headache(784.0)   . Hyperlipidemia   . Hypertension   . Myocardial infarction ([place])   . Obesity   . Palpitations   . Stroke [facility])    TIA - left side residual  . Syncope    exertional  . Tobacco user    30 pack years; 04/2011: 1/4 pack per day during quick attempt    Family History  Problem Relation Age of Onset  . Lung cancer Mother   . Heart disease Brother   . Breast cancer Sister   . Heart disease Maternal Aunt   . Cancer Cousin        lung  . Anesthesia problems Neg Hx   . Hypotension Neg Hx   . Malignant hyperthermia Neg Hx   . Pseudochol deficiency Neg Hx     Social History:  reports that she has been smoking cigarettes. She started smoking about 46 years ago. She has a 15.00 pack-year smoking history. She has never used smokeless tobacco. She reports current drug use. Drug: Marijuana. She reports that she does  not drink alcohol.   Prior to Admission medications   Medication Sig Start Date End Date Taking? Authorizing Provider  aspirin EC 81 MG tablet Take 81 mg by mouth daily.   Yes [provider]  benazepril (LOTENSIN) 20 MG tablet Take 20 mg by mouth daily.   Yes [provider]  chlorthalidone (HYGROTON) 50 MG tablet Take 50 mg by mouth daily.   Yes [provider]  cloNIDine (CATAPRES) 0.3 MG tablet Take 0.3 mg by mouth 3 (three) times daily. 12/30/20  Yes [provider]  estradiol (ESTRACE) 1 MG tablet Take 1 mg by mouth daily.   Yes [provider]  Fluticasone-Umeclidin-Vilant (TRELEGY ELLIPTA) 100-62.5-25 MCG/INH AEPB Inhale 1 puff into the lungs daily.   Yes [provider]  furosemide (LASIX) 40 MG tablet Take 1 tab daily as needed for  swelling Patient taking differently: Take 40 mg by mouth daily. 09/11/15  Yes Branch, Alphonse Guild, MD  insulin aspart (NOVOLOG) 100 UNIT/ML injection Inject 5 Units into the skin 3 (three) times daily after meals.   Yes [provider]  insulin degludec (TRESIBA FLEXTOUCH) 100 UNIT/ML FlexTouch Pen Inject 20 Units into the skin at bedtime.   Yes [provider]  JANUMET 50-500 MG tablet Take 1 tablet by mouth 2 (two) times daily. 12/26/20  Yes [provider]  JARDIANCE 25 MG TABS tablet Take 25 mg by mouth daily.  03/24/16  Yes [provider]  methocarbamol (ROBAXIN) 500 MG tablet Take 500 mg by mouth at bedtime. 01/14/21  Yes [provider]  Multiple Vitamins-Minerals (MULTIVITAMIN ADULT) CHEW Chew 1 tablet by mouth daily.   Yes [provider]  risperiDONE (RISPERDAL) 0.5 MG tablet Take 0.5 mg by mouth at bedtime.   Yes [provider]  rosuvastatin (CRESTOR) 40 MG tablet Take 1 tablet (40 mg total) by mouth daily. 10/14/20  Yes BranchAlphonse Guild, MD  verapamil (CALAN-SR) 240 MG CR tablet Take 240 mg by mouth daily.   Yes [provider]  albuterol (PROVENTIL) (5 MG/ML) 0.5% nebulizer solution Take 2.5 mg by nebulization 3 (three) times daily as needed for wheezing or shortness of breath.     [provider]  albuterol (PROVENTIL,VENTOLIN) 90 MCG/ACT inhaler Inhale 2 puffs into the lungs every 6 (six) hours as needed for wheezing or shortness of breath.     [provider]  topiramate (TOPAMAX) 50 MG tablet Take 50 mg by mouth 2 (two) times daily as needed (headaches).    [provider]    Exam: Current vital signs: BP (!) 153/68 (BP Location: Right Arm)   Pulse (!) 50   Temp 98 F (36.7 C) (Oral)   Resp 16   Wt 112.8 kg   LMP 10/25/2011   SpO2 100%   BMI 36.71 kg/m   Physical Exam  Constitutional: Appears well-developed and well-nourished. Slightly sleepy, but acutely responsive.  Appears fatigued.  Psych: Affect appropriate to situation Eyes: No scleral injection HENT: No OP obstrucion Head: Normocephalic.  Cardiovascular: Sinus bradycardia with regular rhythm.  Respiratory: Effort normal GI: Soft.  No distension. There is no tenderness.  Skin: WDI  Neuro: Mental Status: Patient is easily arouses with name calling. She is slightly sleepy, but oriented.  Patient is able to give a clear and coherent history. Right gaze preference which can be overcome.  Speech/Language: Speech is fluent. Mild dysarthria but no aphasia. Naming, repetition, and comprehension intact.  Cranial Nerves: II: Hemianopia OS. Pupils are equal,  round, and reactive to light.  III,IV, VI: EOMI without ptosis or diploplia. Right gaze preference but can cross midline. VF full by confrontation R eye. Unable to distinguish between 1 or 2 fingers in any quadrant of L eye. V: impaired to LT in V2 VII: Facial movement is symmetric.  VIII: hearing is intact to voice X: Uvula elevates symmetrically XI: Shoulder shrug is symmetric. XII: tongue is midline without atrophy or fasciculations.  Motor: Tone is normal. Bulk is normal. 5/5 strength was present in all four extremities.  Sensory: Sensory impairment LUE and LLE. Extinction to DSS LUE and LLE Plantars: Toes are downgoing bilaterally.  Cerebellar: FNF intact bilaterally.   MD reviewed the images obtained:  NCT head  1. Normal head CT for age. Atherosclerotic calcification of the major vessels at the base of the brain. 2. ASPECTS is 10.  CT perfusion:  ASPECTS: 10 CBF (<30%) Volume: 469m Perfusion (Tmax>6.0s) volume: 031mMismatch Volume: 69m35mnfarction Location:None  CTA head and neck 1. Some motion degradation. No large or medium vessel occlusion. Negative perfusion study. 2. Aortic atherosclerosis. 3. No carotid bifurcation stenosis. 50% stenosis of the right carotid siphon. 30% stenosis of the left carotid  siphon.  Assessment: [age] female who is an inpatient of cardiology's, here for unstable angina and bradycardia. While on floor, she told her RN at 1400 hrs that she had been having vision changes in left eye since breakfast. RN also thought patient was acting more drowsy. NIHSS 4. Not a candidate for tPA due to Heparin drip. CTH negative for acute infarction. CTA head and neck negative for LVO but with some stenosis in carotids. CT perfusion without mismatch, 0 volume, and perfusion volume 0. Patient has multiple risk factors for stroke, including, IDDM II, HTN, HLD, and remote history of TIA/Stroke. She will remain on floor under cardiology care and will undergo full stroke workup.   Impression:  1. OS hemianopia without acute stroke or bleed on imaging in CT suite. TIA vs stroke.    Plan: - MRI brain without contrast. - STAT head CT for any change in neurologic exam - Recommend TTE. - Recommend labs: lipid panel, TSH, PT/INR/aPTT. HbA1c 6.3%.  - Statin per guidelines --no AC at the present time as patient is on a Heparin drip.  -SBP goal- can allow permissive HTN for 24 hours from a neurological stand point but defer to cardiology given her unstable angina.  - Telemetry monitoring for arrhythmia. - Recommend bedside Swallow screen. - Recommend Stroke education. - Recommend PT/OT/SLP consult. -stroke education -frequent neuro checks -NIHSS every 4 hours x 24 hours   Electronically signed by: KarClance BollSN, APN-BC, nurse practitioner and by MD. Pager: [phone] Attending Attestation  I evaluated the patient, reviewed the images, discussed case over phone with her cardiology provider ScoRichardson Doppnd made treatment decisions. Note written by Ms. KirBaltazar Najjar and edited by me to reflect my findings and recommendations.  Summary: Ms. McCPla a [age] woman who was called as inpatient code stroke for L visual disturbance, and not attending to her L side. Pt has pmhx of asthma,  ASD, COPD, depression, DM, CAD, stroke, HTN presenting with chest pain and bradycardia. She was admitted for unstable angina and put on heparin gtt with planned cardiac cath tomorrow. This afternoon she reported to her RN that she had had a visual disturbance in her left eye since this AM around breakfast.  LKW: 0730  NIHSS = 7 @ 1645 (  1 gaze, 2 visual fields, 1 sensory, 1 dysarthria, 2 extinction)  CTH NAICP, no bleed  tPA contraindicated 2/2 anticoagulation on heparin gtt (which was stopped during the stroke code)  CTA H&N and CT perfusion study were performed and were normal. Intervention was not performed 2/2 not indicated 2/2 no LVO.  Stroke workup ordered, stroke consulting service will follow up tomorrow.   Su Monks, MD [facility] [phone]  If 7pm- 7am, please page neurology on call as listed in [place].    This patient is critically ill and at significant risk of neurological worsening, death and care requires constant monitoring of vital signs, hemodynamics,respiratory and cardiac monitoring, neurological assessment, discussion with family, other specialists and medical decision making of high complexity. I spent 75 minutes of neurocritical care time  in the care of  this patient. This was time spent independent of any time provided by nurse practitioner or PA.

## 2021-02-16 ENCOUNTER — Inpatient Hospital Stay (HOSPITAL_COMMUNITY): Payer: Medicare HMO

## 2021-02-16 DIAGNOSIS — H5462 Unqualified visual loss, left eye, normal vision right eye: Secondary | ICD-10-CM

## 2021-02-16 DIAGNOSIS — I200 Unstable angina: Secondary | ICD-10-CM

## 2021-02-16 DIAGNOSIS — G43109 Migraine with aura, not intractable, without status migrainosus: Secondary | ICD-10-CM

## 2021-02-16 DIAGNOSIS — R001 Bradycardia, unspecified: Secondary | ICD-10-CM

## 2021-02-16 DIAGNOSIS — G459 Transient cerebral ischemic attack, unspecified: Secondary | ICD-10-CM

## 2021-02-16 LAB — BASIC METABOLIC PANEL
Anion gap: 7 (ref 5–15)
BUN: 12 mg/dL (ref 6–20)
CO2: 28 mmol/L (ref 22–32)
Calcium: 9.1 mg/dL (ref 8.9–10.3)
Chloride: 104 mmol/L (ref 98–111)
Creatinine, Ser: 1.09 mg/dL — ABNORMAL HIGH (ref 0.44–1.00)
GFR, Estimated: 59 mL/min — ABNORMAL LOW (ref >60)
Glucose, Bld: 126 mg/dL — ABNORMAL HIGH (ref 70–99)
Potassium: 3.4 mmol/L — ABNORMAL LOW (ref 3.5–5.1)
Sodium: 139 mmol/L (ref 135–145)

## 2021-02-16 LAB — GLUCOSE, CAPILLARY
Glucose-Capillary: 143 mg/dL — ABNORMAL HIGH (ref 70–99)
Glucose-Capillary: 164 mg/dL — ABNORMAL HIGH (ref 70–99)
Glucose-Capillary: 277 mg/dL — ABNORMAL HIGH (ref 70–99)
Glucose-Capillary: 97 mg/dL (ref 70–99)

## 2021-02-16 LAB — LIPID PANEL
Cholesterol: 148 mg/dL (ref 0–200)
HDL: 51 mg/dL (ref >40)
LDL Cholesterol: 89 mg/dL (ref 0–99)
Total CHOL/HDL Ratio: 2.9 RATIO
Triglycerides: 42 mg/dL (ref <150)
VLDL: 8 mg/dL (ref 0–40)

## 2021-02-16 LAB — HEPARIN LEVEL (UNFRACTIONATED): Heparin Unfractionated: 0.56 IU/mL (ref 0.30–0.70)

## 2021-02-16 LAB — CBC
HCT: 36.4 % (ref 36.0–46.0)
Hemoglobin: 12.2 g/dL (ref 12.0–15.0)
MCH: 31.4 pg (ref 26.0–34.0)
MCHC: 33.5 g/dL (ref 30.0–36.0)
MCV: 93.6 fL (ref 80.0–100.0)
Platelets: 140 10*3/uL — ABNORMAL LOW (ref 150–400)
RBC: 3.89 MIL/uL (ref 3.87–5.11)
RDW: 12.3 % (ref 11.5–15.5)
WBC: 5.4 10*3/uL (ref 4.0–10.5)
nRBC: 0 % (ref 0.0–0.2)

## 2021-02-16 MED ORDER — POTASSIUM CHLORIDE CRYS ER 20 MEQ PO TBCR
40.0000 meq | EXTENDED_RELEASE_TABLET | Freq: Once | ORAL | Status: AC
  Administered 2021-02-16: 40 meq via ORAL
  Filled 2021-02-16: qty 2

## 2021-02-16 MED ORDER — CLONIDINE HCL 0.2 MG PO TABS
0.2000 mg | ORAL_TABLET | Freq: Three times a day (TID) | ORAL | Status: DC
  Administered 2021-02-16 – 2021-02-17 (×3): 0.2 mg via ORAL
  Filled 2021-02-16 (×3): qty 1

## 2021-02-16 NOTE — Progress Notes (Signed)
ANTICOAGULATION CONSULT NOTE - Follow Up Consult  Pharmacy Consult for heparin Indication: chest pain/ACS  Labs: Recent Labs    02/14/21 2044 02/14/21 2304 02/15/21 0214 02/15/21 0845 02/15/21 1819 02/16/21 0419  HGB  --   --  12.1  --   --  12.2  HCT  --   --  35.7*  --   --  36.4  PLT  --   --  136*  --   --  140*  APTT  --   --   --   --  61*  --   LABPROT  --   --   --   --  12.9  --   INR  --   --   --   --  1.0  --   HEPARINUNFRC  --   --  0.30 0.41  --  0.56  CREATININE 0.94  --  0.93  --   --  1.09*  TROPONINIHS 7 8  --   --   --   --     Assessment: 50 YOF who presented with chest pain and bradycardia. Pharmacy consulted to start IV heparin for ACS. Noted with concern of stroke or TIA on 3/20  Heparin level therapeutic at 0.56 on1250 units/hr this morning. Hgb wnl, plt 140  Goal of Therapy:  -heparin level= 0.3-0.5   Plan:  -Decrease heparin to 1200 units/hr -Daily heparin level and CBC  Hildred Laser, PharmD Clinical Pharmacist **Pharmacist phone directory can now be found on [email or web address] ([email or web address]).  Listed under [place].

## 2021-02-16 NOTE — Plan of Care (Signed)
Code stroke called during day shift for ams. Ct negative. No stroke dx. Pt ate 100% of dinner and was talking with nurse and on the phone appropriately. She does have mild delayed responses but that is baseline for her as I did her admission last night and she made me aware of that.    Problem: Education: Goal: Knowledge of General Education information will improve Description: Including pain rating scale, medication(s)/side effects and non-pharmacologic comfort measures Outcome: Not Progressing   Problem: Health Behavior/Discharge Planning: Goal: Ability to manage health-related needs will improve Outcome: Not Progressing   Problem: Clinical Measurements: Goal: Ability to maintain clinical measurements within normal limits will improve Outcome: Not Progressing Goal: Will remain free from infection Outcome: Not Progressing Goal: Diagnostic test results will improve Outcome: Not Progressing Goal: Respiratory complications will improve Outcome: Not Progressing Goal: Cardiovascular complication will be avoided Outcome: Not Progressing   Problem: Activity: Goal: Risk for activity intolerance will decrease Outcome: Not Progressing   Problem: Nutrition: Goal: Adequate nutrition will be maintained Outcome: Not Progressing   Problem: Coping: Goal: Level of anxiety will decrease Outcome: Not Progressing   Problem: Elimination: Goal: Will not experience complications related to bowel motility Outcome: Not Progressing Goal: Will not experience complications related to urinary retention Outcome: Not Progressing   Problem: Pain Managment: Goal: General experience of comfort will improve Outcome: Not Progressing   Problem: Safety: Goal: Ability to remain free from injury will improve Outcome: Not Progressing   Problem: Skin Integrity: Goal: Risk for impaired skin integrity will decrease Outcome: Not Progressing

## 2021-02-16 NOTE — Progress Notes (Signed)
Bedside report received. Team checks done. Pt vss. No acute distress noted. No family at bedside. Call bell with in reach. Bed alarm on. Will assume poc and cont to monitor pt per md orders. See charted full assessments.    

## 2021-02-16 NOTE — Progress Notes (Addendum)
STROKE TEAM PROGRESS NOTE   INTERVAL HISTORY  Patient stated that she developed left visual changes on Sunday morning while lying in the hospital bed.  Initially, her vision was blurry then she had complete blindness on the left with left sided neglect. Code stroke was called.  CTA head and neck and CT perfusion study were performed and were normal. tPA contraindicated secondary to  anticoagulation on heparin gtt for unstable angina.  Intervention was not performed as no LVO.  The patient's symptoms completely resolved.    Vitals:   02/16/21 0900 02/16/21 1100 02/16/21 1536 02/16/21 1613  BP: (!) 123/53 123/67 116/62 121/67  Pulse: (!) 50 (!) 48  61  Resp: 16 16  16   Temp: 97.8 F (36.6 C) 98 F (36.7 C)  97.7 F (36.5 C)  TempSrc: Oral Oral  Oral  SpO2: 96% 97%  98%  Weight:      Height:  5\' 9"  (1.753 m)     CBC:  Recent Labs  Lab 02/15/21 0214 02/16/21 0419  WBC 4.9 5.4  HGB 12.1 12.2  HCT 35.7* 36.4  MCV 93.9 93.6  PLT 136* 124*   Basic Metabolic Panel:  Recent Labs  Lab 02/14/21 2044 02/15/21 0214 02/16/21 0419  NA 139 139 139  K 3.4* 3.7 3.4*  CL 105 106 104  CO2 27 24 28   GLUCOSE 84 165* 126*  BUN 6 10 12   CREATININE 0.94 0.93 1.09*  CALCIUM 9.1 8.5* 9.1  MG 2.1  --   --    Lipid Panel:  Recent Labs  Lab 02/16/21 0419  CHOL 148  TRIG 42  HDL 51  CHOLHDL 2.9  VLDL 8  LDLCALC 89   HgbA1c:  Recent Labs  Lab 02/14/21 2044  HGBA1C 6.3*   Urine Drug Screen: No results for input(s): LABOPIA, COCAINSCRNUR, LABBENZ, AMPHETMU, THCU, LABBARB in the last 168 hours.  Alcohol Level No results for input(s): ETH in the last 168 hours.  IMAGING past 24 hours MR BRAIN WO CONTRAST  Result Date: 02/16/2021 IMPRESSION:  1. No acute intracranial abnormality.  2. Chronic small vessel disease with minimal progression from an MRI last year.  CT head  1. Normal head CT for age. Atherosclerotic calcification of the major vessels at the base of the brain. 2.  ASPECTS is 10.  CT perfusion:  ASPECTS: 10 CBF (<30%) Volume: 34mL Perfusion (Tmax>6.0s) volume: 49mL Mismatch Volume: 54mL Infarction Location:None  CTA head and neck 1. Some motion degradation. No large or medium vessel occlusion. Negative perfusion study. 2. Aortic atherosclerosis. 3. No carotid bifurcation stenosis. 50% stenosis of the right carotid siphon. 30% stenosis of the left carotid siphon.  PHYSICAL EXAM Physical Exam  Constitutional: Appears well-developed and well-nourished. Slightly sleepy, but acutely responsive. Appears fatigued.  Psych: Affect appropriate to situation Eyes: No scleral injection HENT: No OP obstrucion Head: Normocephalic.  Cardiovascular: Sinus bradycardia with regular rhythm.  Respiratory: Effort normal GI: Soft.  No distension. There is no tenderness.  Skin: WDI  Neuro: Mental Status: Patient is awake, alert, oriented to person, place, time and situation.  Patient is able to give a clear and coherent history.  Speech/Language: Speech is fluent. Mild dysarthria but no aphasia. Naming, repetition, and comprehension intact.  Cranial Nerves: II: Pupils are equal, round, and reactive to light.  III,IV, VI: EOMI without ptosis or diploplia.  Visual fields are full V: impaired to LT in V2 VII: Facial movement is symmetric.  VIII: hearing is intact to voice X: Uvula  elevates symmetrically XI: Shoulder shrug is symmetric. XII: tongue is midline without atrophy or fasciculations.  Motor: Tone is normal. Bulk is normal. 4+/5 strength was present in LUE, BLE, RUE 4/5 Sensory: Decreased sensation on the left UE Plantars: Toes are downgoing bilaterally.  Cerebellar: FNF intact bilaterally.   ASSESSMENT/PLAN [name] is a 60 y.o. female with history of HTN, HLD, IDDM II, TIA/stroke, remote tobacco abuse, asthma, syncope, aortic stenosis, COPD, depression, CAD s/p MI  presenting with brief episode of loss of vision on the left  eye, right sided gaze preference, and left sided neglect.   Possible TIA  Code Stroke CT head No acute abnormality. ASPECTS 10.    CTA head & neck: No large vessel occlusion. Aortic atherosclerosis. 50% stenosis of the right carotid siphon. 30% stenosis of the left carotid siphon.  CT perfusion: Negative perfusion study.  MRI: No acute intracranial abnormality. Chronic small vessel disease  2D Echo 60-65%, moderate concentric LVH  LDL 89  HgbA1c 6.3  VTE prophylaxis - Heparin infusion for unstable angina (Cardiac cath pending)  aspirin 81 mg daily prior to admission, now on aspirin 81 mg daily and heparin IV. Antithrombotic regimen per cardiology.    Therapy recommendations:  Outpatient PT  Disposition:  pending  CAD Unstable angina  Chronic sinus bradycardia, Aortic stenosis, previous MI  Presented with chest pain and bradycardia  Cardiology concerning for unstable angina  Plan for cardiac cath  On heparin IV and aspirin 81  Hx of stroke   Stroke 4 years ago, unclear location. Patient states that she presented at that time with left sided weakness and ataxia.  Treated for a stroke.  No rehab needed only outpatient physical therapy.  Has been using rolling walker since that time.    Had a "mini stroke" May 2021.  Had difficulty swallowing.  Home health for ADL assistance.  Was on Aspirin and plavix then aspirin alone.   Hypertension  Home meds:  Benazepril 20mg  daily, clonidine 0.3mg  TID, verapamil 240mg  daily  Stable . BP goal normotensive  Hyperlipidemia  Home meds:  Rosuvastatin 40mg  daily, resumed in hospital  LDL 89, goal < 70  Continue statin at discharge  Diabetes type II Controlled  Home meds:  Tresiba 20 u qhs, Novolog 5 u tid ac and Janumet  HgbA1c 6.3, goal < 7.0  CBGs  SSI  Close PCP follow-up  Other Stroke Risk Factors  Former cigarette smoker   Morbid Obesity, Body mass index is 36.71 kg/m., BMI >/= 30 associated with  increased stroke risk, recommend weight loss, diet and exercise as appropriate   Migraine  Other Active Problems  COPD  Anxiety/depression  Total hysterectomy currently on Estrogen - recommend to re-establish OB/GYN follow-up to discuss of estrogen.  If not needed, recommend to stop estrogen.  Discussed with patient and she is aware.  Hospital day # 2  ATTENDING NOTE: I reviewed above note and agree with the assessment and plan. Pt was seen and examined.   60 year old female with history of hypertension, hyperlipidemia, diabetes, CAD/MI, aortic stenosis admitted for chest pain and bradycardia.  Cardiology concerning for unstable angina, planning for cardiac cath, on heparin IV.  Patient developed left eye decreased vision, possible left neglect and right gaze preference.  Code stroke activated.  Patient stated that her left eye decreased vision improved overnight, she woke up this morning with baseline left eye vision.  On exam today, patient bilateral vision at least counting fingers, no visual field deficit, no  eye movement difficulty.  Neurological examination intact.  No neglect or inattention.  CT no acute abnormality.  CT head and neck no LVO, 50% on the right siphon and 30% left siphon stenosis.  EF 60 to 65%, A1c 6.3 LDL 89 and MRI no acute stroke.   Etiology for patient symptoms not quite clear, possible TIA versus ocular migraine.  Currently on aspirin and heparin IV, okay to continue.  Further antithrombotic regimen per cardiology.  Continue Crestor 40.  Of note, patient estrogen 1 mg daily.  Per patient, she had total hysterectomy for which she is on aspirin.  However her OB/GYN doctor has been retired and she has not been seeing OB/GYN for years.  I recommend to re-establish OB GYN care to be set of estrogen.  Not needed, recommend to stop to decrease thromboembolic risk.  For detailed assessment and plan, please refer to above as I have made changes wherever appropriate.    Neurology will sign off. Please call with questions. Pt will follow up with stroke clinic NP at [facility] in about 4 weeks. Thanks for the consult.  Rosalin Hawking, MD PhD Stroke Neurology 02/16/2021 5:10 PM  To contact Stroke Continuity provider, please refer to [email or web address] After hours, contact General Neurology

## 2021-02-16 NOTE — Evaluation (Signed)
Clinical/Bedside Swallow Evaluation Patient Details  Name: Amber Harris MRN: [id number] Date of Birth: May 16, 1961  Today's Date: 02/16/2021 Time: SLP Start Time (ACUTE ONLY): 0347 SLP Stop Time (ACUTE ONLY): 0936 SLP Time Calculation (min) (ACUTE ONLY): 14 min  Past Medical History:  Past Medical History:  Diagnosis Date  . Aortic valve disease    Long-standing systolic murmur  . Asthma    Uses p.r.n. albuterol  . Bronchitis   . COPD (chronic obstructive pulmonary disease) (HCC)   . Degenerative joint disease    right knee  . Depression   . Depression with anxiety   . Diabetes mellitus   . Dyspnea    exertion  . Dyspnea on exertion    poor exercise tolerance  . Fibroids    uterine; postmenopausal bleeding  . GERD (gastroesophageal reflux disease)   . Headache(784.0)   . Hyperlipidemia   . Hypertension   . Myocardial infarction (HCC)   . Obesity   . Palpitations   . Stroke [facility])    TIA - left side residual  . Syncope    exertional  . Tobacco user    30 pack years; 04/2011: 1/4 pack per day during quick attempt   Past Surgical History:  Past Surgical History:  Procedure Laterality Date  . ABDOMINAL HYSTERECTOMY  12/21/2011   Procedure: HYSTERECTOMY ABDOMINAL;  Surgeon: Tilda Burrow, MD;  Location: [facility];  Service: Gynecology;  Laterality: N/A;  . CATARACT EXTRACTION W/PHACO Left 10/03/2020   Procedure: CATARACT EXTRACTION PHACO AND INTRAOCULAR LENS PLACEMENT LEFT EYE;  Surgeon: Fabio Pierce, MD;  Location: [facility];  Service: Ophthalmology;  Laterality: Left;  CDE 5.04  . CATARACT EXTRACTION W/PHACO Right 10/17/2020   Procedure: CATARACT EXTRACTION PHACO AND INTRAOCULAR LENS PLACEMENT RIGHT EYE;  Surgeon: Fabio Pierce, MD;  Location: [facility];  Service: Ophthalmology;  Laterality: Right;  CDE: 4.60  . TUBAL LIGATION     HPI:  Pt is an 60 y.o. female who presented with sudden onset chest pain and bradycardia. Code stroke was initiated (3/20) due to new  neurological changes; however, MRI on 3/21 was negative. CTA 3/20: Aortic atherosclerosis. PMH significant for GERD, DM, COPD, HTN, CVA, myocardial infarction.   Assessment / Plan / Recommendation Clinical Impression  Pt presents with an overall oropharyngeal swallow that is [facility]. No overt s/s of dysphagia were observed during all PO trials and she previously passed the [employer] Screen (02/15/2021). Pt also did not report any subjective symptoms of dysphagia. Recommend continuation of a regular diet and thin liquids. SLP will sign off at this time.  SLP Visit Diagnosis: Dysphagia, unspecified (R13.10)    Aspiration Risk  No limitations    Diet Recommendation Regular;Thin liquid   Liquid Administration via: Cup;Straw Medication Administration: Whole meds with liquid Supervision: Patient able to self feed;Intermittent supervision to cue for compensatory strategies Compensations: Slow rate;Small sips/bites Postural Changes: Seated upright at 90 degrees;Remain upright for at least 30 minutes after po intake    Other  Recommendations Oral Care Recommendations: Oral care BID   Follow up Recommendations        Frequency and Duration            Prognosis Prognosis for Safe Diet Advancement: Good      Swallow Study   General HPI: Pt is an 60 y.o. female who presented with sudden onset chest pain and bradycardia. Code stroke was initiated (3/20) due to new neurological symptoms; however, MRI on 3/21 was negative. CTA 3/20: Aortic atherosclerosis.  PMH significant for GERD, DM, COPD, HTN, CVA, myocardial infarction. Type of Study: Bedside Swallow Evaluation Previous Swallow Assessment: None in chart Diet Prior to this Study: Regular;Thin liquids Temperature Spikes Noted: No Respiratory Status: Room air History of Recent Intubation: No Behavior/Cognition: Alert;Cooperative;Pleasant mood Oral Cavity Assessment: Within Functional Limits Oral Care Completed by SLP: No Oral Cavity -  Dentition: Adequate natural dentition Vision: Functional for self-feeding Self-Feeding Abilities: Able to feed self Patient Positioning: Upright in bed Baseline Vocal Quality: Normal Volitional Cough: Strong Volitional Swallow: Able to elicit    Oral/Motor/Sensory Function Overall Oral Motor/Sensory Function: Within functional limits   Ice Chips Ice chips: Not tested   Thin Liquid Thin Liquid: Within functional limits Presentation: Cup;Straw    Nectar Thick Nectar Thick Liquid: Not tested   Honey Thick Honey Thick Liquid: Not tested   Puree Puree: Within functional limits Presentation: Self Fed;Spoon   Solid     Solid: Within functional limits Presentation: Self Fed      Zettie Cooley., SLP Student 02/16/2021,10:56 AM

## 2021-02-16 NOTE — Evaluation (Signed)
Physical Therapy Evaluation Patient Details Name: Amber Harris MRN: [id number] DOB: July 13, 1961 Today's Date: 02/16/2021   History of Present Illness  Patient is a 60 y/o female who presents on 02/16/32 with chest pain and bradycardia; code stroke called 3/20 due to visual changes and decreased arousal. NIH:4 Head CT and MRI-unremarkable. Neuro workup pending. Plan for cardiac cath pending stroke workup. PMH includes mild aortic stenosis as of 2019, tobacco use, previous CVA, HTN, DM2, chronic bradycardia, and reported history of previous MI.  Clinical Impression  Patient presents with generalized weakness (LLE>RLE which pt reports is premorbid), bradycardia and impaired mobility s/p above. Pt lives alone and reports being Mod I for ADLs and ambulation using SPC vs RW for safety. Reports hx of multiple falls. Son lives close by and helps as needed. Today, pt tolerated bed mobility, transfers and short distance gait training with supervision-Min guard assist for balance/safety. Reports neuro symptoms have resolved from yesterday. Encouraged walking to bathroom as tolerated with nursing. Cardiac cath postponed due to stroke workup. Will follow acutely to maximize independence and mobility prior to return home.    Follow Up Recommendations Outpatient PT;Supervision - Intermittent (pending progress)    Equipment Recommendations  None recommended by PT    Recommendations for Other Services       Precautions / Restrictions Precautions Precautions: Fall;Other (comment) Precaution Comments: reports hx of falls, bradycardia Restrictions Weight Bearing Restrictions: No      Mobility  Bed Mobility Overal bed mobility: Needs Assistance Bed Mobility: Supine to Sit;Sit to Supine     Supine to sit: Supervision;HOB elevated Sit to supine: Supervision;HOB elevated   General bed mobility comments: No assist needed, use of rails. Able to return to bed without difficulty.    Transfers Overall  transfer level: Needs assistance Equipment used: Rolling walker (2 wheeled) Transfers: Sit to/from Stand Sit to Stand: Min guard         General transfer comment: Min guard for safety. Stood from [employer], from toilet x1.  Ambulation/Gait Ambulation/Gait assistance: Min guard Gait Distance (Feet): 20 Feet (x2 bouts) Assistive device: Rolling walker (2 wheeled) Gait Pattern/deviations: Step-through pattern;Decreased stride length;Trunk flexed Gait velocity: decreased   General Gait Details: Slow, mostly steady gait with RW for support; cognizant of lines when turning. No knee buckling. HR 51-65 bpm during activity, no dizziness.  Stairs            Wheelchair Mobility    Modified Rankin (Stroke Patients Only)       Balance Overall balance assessment: History of Falls;Needs assistance Sitting-balance support: Feet supported;No upper extremity supported Sitting balance-Leahy Scale: Good Sitting balance - Comments: Needs total A to donn socks as pt normally uses reacher and DME   Standing balance support: During functional activity Standing balance-Leahy Scale: Poor Standing balance comment: Requires UE support in standing.                             Pertinent Vitals/Pain Pain Assessment: No/denies pain Faces Pain Scale: No hurt    Home Living Family/patient expects to be discharged to:: Private residence Living Arrangements: Alone Available Help at Discharge: Family;Available PRN/intermittently Type of Home: Apartment Home Access: Level entry;Stairs to enter   Entrance Stairs-Number of Steps: goes out patio with no stairs Home Layout: One level Home Equipment: Walker - 2 wheels;Bedside commode;Cane - single point;Shower seat;Grab bars - tub/shower      Prior Function Level of Independence: Independent with assistive  device(s)         Comments: uses RW vs SPC for ambulation. Reports lots of falls. Son lives close by. Does not drive.     Hand  Dominance   Dominant Hand: Right    Extremity/Trunk Assessment   Upper Extremity Assessment Upper Extremity Assessment: Defer to OT evaluation    Lower Extremity Assessment Lower Extremity Assessment: LLE deficits/detail;Generalized weakness LLE Deficits / Details: Grossly ~3+/5 throughout, reports weaker than RLE but this is not new, "i think it is from my old stroke." LLE Sensation: WNL LLE Coordination: WNL       Communication   Communication: No difficulties  Cognition Arousal/Alertness: Awake/alert Behavior During Therapy: WFL for tasks assessed/performed Overall Cognitive Status: Within Functional Limits for tasks assessed                                 General Comments: for basic mobility tasks.      General Comments General comments (skin integrity, edema, etc.): Bradycardic at rest in low 50s.    Exercises     Assessment/Plan    PT Assessment Patient needs continued PT services  PT Problem List Decreased strength;Decreased mobility;Decreased balance;Cardiopulmonary status limiting activity       PT Treatment Interventions Therapeutic exercise;Gait training;Balance training;Patient/family education;Therapeutic activities;Functional mobility training    PT Goals (Current goals can be found in the Care Plan section)  Acute Rehab PT Goals Patient Stated Goal: to get better and go home PT Goal Formulation: With patient Time For Goal Achievement: 03/02/21 Potential to Achieve Goals: Good    Frequency Min 3X/week   Barriers to discharge Decreased caregiver support lives alone    Co-evaluation               AM-PAC PT "6 Clicks" Mobility  Outcome Measure Help needed turning from your back to your side while in a flat bed without using bedrails?: None Help needed moving from lying on your back to sitting on the side of a flat bed without using bedrails?: A Little Help needed moving to and from a bed to a chair (including a wheelchair)?:  A Little Help needed standing up from a chair using your arms (e.g., wheelchair or bedside chair)?: A Little Help needed to walk in hospital room?: A Little Help needed climbing 3-5 steps with a railing? : A Little 6 Click Score: 19    End of Session Equipment Utilized During Treatment: Gait belt Activity Tolerance: Patient tolerated treatment well Patient left: in bed;with call bell/phone within reach;with bed alarm set Nurse Communication: Mobility status PT Visit Diagnosis: Difficulty in walking, not elsewhere classified (R26.2);Muscle weakness (generalized) (M62.81)    Time: 1610-9604 PT Time Calculation (min) (ACUTE ONLY): 21 min   Charges:   PT Evaluation $PT Eval Moderate Complexity: 1 Mod          Vale Haven, PT, DPT Acute Rehabilitation Services Pager [phone] Office [phone]      Blake Divine A Danisa Kopec 02/16/2021, 11:28 AM

## 2021-02-16 NOTE — Progress Notes (Signed)
Late entry.  Reviewed with Dr. Quinn Axe last night. Discussed with attending MD (Dr. Domenic Polite).  Will cancel cath for Monday for neuro work up.  Richardson Dopp, PA-C 02/16/21 06:15

## 2021-02-16 NOTE — Progress Notes (Addendum)
Progress Note  Patient Name: Amber Harris Date of Encounter: 02/16/2021  Primary Cardiologist: Dina Rich, MD  Subjective   No CP or SOB No confusion, weakness, no vision changes, no lethargy C/o some dizziness when she gets up, says not used to HR this low  Inpatient Medications    Scheduled Meds: . aspirin EC  81 mg Oral Daily  . benazepril  20 mg Oral Daily  . chlorthalidone  50 mg Oral Daily  . cloNIDine  0.3 mg Oral TID  . empagliflozin  25 mg Oral Daily  . estradiol  1 mg Oral Daily  . fluticasone furoate-vilanterol  1 puff Inhalation Daily  . furosemide  40 mg Oral Daily  . insulin aspart  0-15 Units Subcutaneous TID WC  . insulin aspart  0-5 Units Subcutaneous QHS  . insulin aspart  5 Units Subcutaneous TID WC  . insulin glargine  20 Units Subcutaneous QHS  . linagliptin  5 mg Oral Daily  . multivitamin with minerals  1 tablet Oral Daily  . risperiDONE  0.5 mg Oral QHS  . rosuvastatin  40 mg Oral Daily  . sodium chloride flush  3 mL Intravenous Q12H  . umeclidinium bromide  1 puff Inhalation Daily   Continuous Infusions: . sodium chloride    . sodium chloride 1 mL/kg/hr (02/16/21 0650)  . heparin 1,250 Units/hr (02/16/21 0000)  . nitroGLYCERIN 5 mcg/min (02/16/21 0000)   PRN Meds: sodium chloride, acetaminophen, albuterol, albuterol, nitroGLYCERIN, ondansetron (ZOFRAN) IV, sodium chloride flush, topiramate   Vital Signs    Vitals:   02/16/21 0014 02/16/21 0810 02/16/21 0854 02/16/21 0900  BP: (!) 146/76 (!) 132/55 (!) 132/55 (!) 123/53  Pulse: (!) 48 (!) 44  (!) 50  Resp: 14 16  16   Temp: 98 F (36.7 C) 97.9 F (36.6 C)  97.8 F (36.6 C)  TempSrc: Oral Oral  Oral  SpO2: 99% 100%  96%  Weight:        Intake/Output Summary (Last 24 hours) at 02/16/2021 0938 Last data filed at 02/16/2021 0000 Gross per 24 hour  Intake 652.44 ml  Output --  Net 652.44 ml   Filed Weights   02/15/21 0436  Weight: 112.8 kg    Telemetry    Sinus  brady, HR high 30s at times  Personally reviewed.  ECG    None today  Physical Exam   General: Well developed, well nourished, female in no acute distress Head: Eyes PERRLA, Head normocephalic and atraumatic Lungs: clear bilaterally to auscultation. Heart: HRRR S1 S2, without rub or gallop. soft murmur. 4/4 extremity pulses are 2+ & equal. No JVD. Abdomen: Bowel sounds are present, abdomen soft and non-tender without masses or  hernias noted. Msk: Normal strength and tone for age. Extremities: No clubbing, cyanosis or edema.    Skin:  No rashes or lesions noted. Neuro: Alert and oriented X 3. Psych:  Good affect, responds appropriately   Labs    Chemistry Recent Labs  Lab 02/14/21 2044 02/15/21 0214 02/16/21 0419  NA 139 139 139  K 3.4* 3.7 3.4*  CL 105 106 104  CO2 27 24 28   GLUCOSE 84 165* 126*  BUN 6 10 12   CREATININE 0.94 0.93 1.09*  CALCIUM 9.1 8.5* 9.1  PROT 7.5  --   --   ALBUMIN 3.5  --   --   AST 28  --   --   ALT 26  --   --   ALKPHOS 81  --   --  BILITOT 1.5*  --   --   GFRNONAA >60 >60 59*  ANIONGAP 7 9 7      Hematology Recent Labs  Lab 02/15/21 0214 02/16/21 0419  WBC 4.9 5.4  RBC 3.80* 3.89  HGB 12.1 12.2  HCT 35.7* 36.4  MCV 93.9 93.6  MCH 31.8 31.4  MCHC 33.9 33.5  RDW 12.4 12.3  PLT 136* 140*    Cardiac Enzymes Recent Labs  Lab 02/14/21 2044 02/14/21 2304  TROPONINIHS 7 8   Lab Results  Component Value Date   HGBA1C 6.3 (H) 02/14/2021   Lab Results  Component Value Date   TSH 0.544 02/15/2021   Lab Results  Component Value Date   CHOL 148 02/16/2021   HDL 51 02/16/2021   LDLCALC 89 02/16/2021   TRIG 42 02/16/2021   CHOLHDL 2.9 02/16/2021   BNP Recent Labs  Lab 02/14/21 2044  BNP 70.1    Cardiac Studies    ECHO: 02/15/2021 1. Left ventricular ejection fraction, by estimation, is 60 to 65%. The  left ventricle has normal function. The left ventricle has no regional  wall motion abnormalities. There is  moderate concentric left ventricular  hypertrophy. Left ventricular diastolic parameters are indeterminate. Elevated left ventricular end-diastolic pressure.  2. Right ventricular systolic function is normal. The right ventricular  size is normal. Tricuspid regurgitation signal is inadequate for assessing  PA pressure.  3. The mitral valve is normal in structure. Trivial mitral valve  regurgitation. No evidence of mitral stenosis.  4. The aortic valve is calcified. Aortic valve regurgitation is mild.  Mild to moderate aortic valve sclerosis/calcification is present, without  any evidence of aortic stenosis. Focal dense calcification of the left  coornary cusp is present. Aortic regurgitation PHT measures 543 msec. Mild to moderate aortic valve sclerosis/calcification is present, without any evidence of aortic stenosis.  5. The inferior vena cava is dilated in size with <50% respiratory  variability, suggesting right atrial pressure of 15 mmHg.    Radiology    MR BRAIN WO CONTRAST  Result Date: 02/16/2021 CLINICAL DATA:  60 year old female code stroke presentation with unrevealing CTA, CTP. EXAM: MRI HEAD WITHOUT CONTRAST TECHNIQUE: Multiplanar, multiecho pulse sequences of the brain and surrounding structures were obtained without intravenous contrast. COMPARISON:  CTA, CT head and CTP yesterday. [facility] Brain MRI 05/27/2020, 10/27/2014. FINDINGS: Brain: No restricted diffusion or evidence of acute infarction. Chronic prominent lacunar infarct of the right pons again noted. Small area of focal encephalomalacia in the body of the corpus callosum is stable. No midline shift, mass effect, evidence of mass lesion, ventriculomegaly, extra-axial collection or acute intracranial hemorrhage. Cervicomedullary junction within normal limits. Chronic partially empty sella. Mild T2 hyperintensity in the left basal ganglia has progressed since last year. Otherwise stable gray and white  matter signal throughout the brain. No cortical encephalomalacia or chronic cerebral blood products identified. Vascular: Major intracranial vascular flow voids are stable since last year. Skull and upper cervical spine: Bone marrow signal is stable since 2015. Partially visible cervical spine disc degeneration. Sinuses/Orbits: Postoperative changes to both globes since last year. Orbits otherwise appears stable, negative. Paranasal sinuses and mastoids are stable and well pneumatized. Other: Grossly normal visible internal auditory structures. Visible scalp and face appear negative. IMPRESSION: 1. No acute intracranial abnormality. 2. Chronic small vessel disease with minimal progression from an MRI last year. Electronically Signed   By: Odessa Fleming M.D.   On: 02/16/2021 08:08   CT CEREBRAL PERFUSION W CONTRAST  Result Date: 02/15/2021 CLINICAL DATA:  Visual disturbance. Acute stroke suspected. Negative head CT. EXAM: CT ANGIOGRAPHY HEAD AND NECK CT PERFUSION BRAIN TECHNIQUE: Multidetector CT imaging of the head and neck was performed using the standard protocol during bolus administration of intravenous contrast. Multiplanar CT image reconstructions and MIPs were obtained to evaluate the vascular anatomy. Carotid stenosis measurements (when applicable) are obtained utilizing NASCET criteria, using the distal internal carotid diameter as the denominator. Multiphase CT imaging of the brain was performed following IV bolus contrast injection. Subsequent parametric perfusion maps were calculated using RAPID software. CONTRAST:  OMNIPAQUE IOHEXOL 350 MG/ML SOLN COMPARISON:  Head CT earlier same day. FINDINGS: CTA NECK FINDINGS Aortic arch: Aortic atherosclerotic calcification. Branching pattern is normal without origin stenosis. Right carotid system: Common carotid artery widely patent to the bifurcation. No evidence of carotid bifurcation soft or calcified plaque. No stenosis. Some motion degradation. Left  carotid system: Common carotid artery widely patent to the bifurcation. No evidence of bifurcation soft or calcified plaque. No stenosis. Some motion degradation. Vertebral arteries: Both vertebral artery origins are widely patent. Both vertebral arteries are patent through the cervical region. Motion degradation. Skeleton: Ordinary cervical spondylosis. Other neck: No mass or adenopathy.  Negative Upper chest: Negative Review of the MIP images confirms the above findings CTA HEAD FINDINGS Anterior circulation: Both internal carotid arteries are patent through the skull base and siphon regions. There is siphon atherosclerotic calcification with stenosis estimated at 50% on the right and 30% on the left. The anterior and middle cerebral vessels are patent. No large or medium vessel occlusion. No aneurysm or vascular malformation. Posterior circulation: Both vertebral arteries are patent to the basilar. No basilar stenosis. Superior cerebellar and posterior cerebral arteries show flow. Venous sinuses: Patent and normal. Anatomic variants: None significant. Review of the MIP images confirms the above findings CT Brain Perfusion Findings: ASPECTS: 10 CBF (<30%) Volume: 0mL Perfusion (Tmax>6.0s) volume: 0mL Mismatch Volume: 0mL Infarction Location:None IMPRESSION: 1. Some motion degradation. No large or medium vessel occlusion. Negative perfusion study. 2. Aortic atherosclerosis. 3. No carotid bifurcation stenosis. 50% stenosis of the right carotid siphon. 30% stenosis of the left carotid siphon. Aortic Atherosclerosis (ICD10-I70.0). Electronically Signed   By: Paulina Fusi M.D.   On: 02/15/2021 17:54   ECHOCARDIOGRAM COMPLETE  Result Date: 02/15/2021    ECHOCARDIOGRAM REPORT   Patient Name:   Amber Harris Date of Exam: 02/15/2021 Medical Rec #:  [id number]        Height:       69.0 in Accession #:    [id number]       Weight:       248.6 lb Date of Birth:  18-Sep-1961        BSA:          2.266 m Patient Age:    59  years         BP:           128/69 mmHg Patient Gender: F                HR:           48 bpm. Exam Location:  Inpatient Procedure: 2D Echo, Cardiac Doppler, Color Doppler and Intracardiac            Opacification Agent Indications:    Chest pain  History:        Patient has prior history of Echocardiogram examinations, most  recent 05/22/2018. CAD, COPD; Risk Factors:Diabetes and                 Hypertension. H/O stroke.  Sonographer:    Ross Ludwig RDCS (AE) Referring Phys: [id number] [facility] CARNICELLI  Sonographer Comments: Patient is morbidly obese. Image acquisition challenging due to patient body habitus and Image acquisition challenging due to COPD. IMPRESSIONS  1. Left ventricular ejection fraction, by estimation, is 60 to 65%. The left ventricle has normal function. The left ventricle has no regional wall motion abnormalities. There is moderate concentric left ventricular hypertrophy. Left ventricular diastolic parameters are indeterminate. Elevated left ventricular end-diastolic pressure.  2. Right ventricular systolic function is normal. The right ventricular size is normal. Tricuspid regurgitation signal is inadequate for assessing PA pressure.  3. The mitral valve is normal in structure. Trivial mitral valve regurgitation. No evidence of mitral stenosis.  4. The aortic valve is calcified. Aortic valve regurgitation is mild. Mild to moderate aortic valve sclerosis/calcification is present, without any evidence of aortic stenosis. Focal dense calcification of the left coornary cusp is present. Aortic regurgitation PHT measures 543 msec.  5. The inferior vena cava is dilated in size with <50% respiratory variability, suggesting right atrial pressure of 15 mmHg. FINDINGS  Left Ventricle: Left ventricular ejection fraction, by estimation, is 60 to 65%. The left ventricle has normal function. The left ventricle has no regional wall motion abnormalities. Definity contrast agent was given IV  to delineate the left ventricular  endocardial borders. The left ventricular internal cavity size was normal in size. There is moderate concentric left ventricular hypertrophy. Left ventricular diastolic parameters are indeterminate. Elevated left ventricular end-diastolic pressure. Right Ventricle: The right ventricular size is normal. No increase in right ventricular wall thickness. Right ventricular systolic function is normal. Tricuspid regurgitation signal is inadequate for assessing PA pressure. Left Atrium: Left atrial size was normal in size. Right Atrium: Right atrial size was normal in size. Pericardium: There is no evidence of pericardial effusion. Mitral Valve: The mitral valve is normal in structure. Trivial mitral valve regurgitation. No evidence of mitral valve stenosis. Tricuspid Valve: The tricuspid valve is normal in structure. Tricuspid valve regurgitation is trivial. No evidence of tricuspid stenosis. Aortic Valve: The aortic valve is calcified. Aortic valve regurgitation is mild. Aortic regurgitation PHT measures 543 msec. Mild to moderate aortic valve sclerosis/calcification is present, without any evidence of aortic stenosis. Pulmonic Valve: The pulmonic valve was normal in structure. Pulmonic valve regurgitation is not visualized. No evidence of pulmonic stenosis. Aorta: The aortic root is normal in size and structure. Venous: The inferior vena cava is dilated in size with less than 50% respiratory variability, suggesting right atrial pressure of 15 mmHg. IAS/Shunts: No atrial level shunt detected by color flow Doppler.  LEFT VENTRICLE PLAX 2D LVIDd:         4.60 cm  Diastology LVIDs:         2.70 cm  LV e' medial:    6.85 cm/s LV PW:         1.50 cm  LV E/e' medial:  13.1 LV IVS:        1.40 cm  LV e' lateral:   5.66 cm/s LVOT diam:     2.10 cm  LV E/e' lateral: 15.8 LV SV:         80 LV SV Index:   35 LVOT Area:     3.46 cm  RIGHT VENTRICLE  IVC RV Basal diam:  3.50 cm     IVC  diam: 2.40 cm RV S prime:     10.70 cm/s TAPSE (M-mode): 2.3 cm LEFT ATRIUM             Index       RIGHT ATRIUM           Index LA diam:        3.30 cm 1.46 cm/m  RA Area:     16.60 cm LA Vol (A2C):   50.0 ml 22.06 ml/m RA Volume:   39.10 ml  17.25 ml/m LA Vol (A4C):   54.0 ml 23.83 ml/m LA Biplane Vol: 52.4 ml 23.12 ml/m  AORTIC VALVE LVOT Vmax:   99.90 cm/s LVOT Vmean:  60.400 cm/s LVOT VTI:    0.231 m AI PHT:      543 msec  AORTA Ao Root diam: 3.30 cm Ao Asc diam:  3.40 cm MITRAL VALVE MV Area (PHT): 2.69 cm    SHUNTS MV Decel Time: 282 msec    Systemic VTI:  0.23 m MV E velocity: 89.50 cm/s  Systemic Diam: 2.10 cm MV A velocity: 54.00 cm/s MV E/A ratio:  1.66 Armanda Magic MD Electronically signed by Armanda Magic MD Signature Date/Time: 02/15/2021/2:47:53 PM    Final    CT HEAD CODE STROKE WO CONTRAST  Result Date: 02/15/2021 CLINICAL DATA:  Code stroke.  Visual disturbance.  Stroke suspected. EXAM: CT HEAD WITHOUT CONTRAST TECHNIQUE: Contiguous axial images were obtained from the base of the skull through the vertex without intravenous contrast. COMPARISON:  MRI 05/27/2020 FINDINGS: Brain: Normal appearance by CT without evidence of atrophy, old or acute infarction, mass lesion, hemorrhage, hydrocephalus or extra-axial collection. Vascular: There is atherosclerotic calcification of the major vessels at the base of the brain. Skull: Negative Sinuses/Orbits: Clear/normal Other: None ASPECTS (Alberta Stroke Program Early CT Score) - Ganglionic level infarction (caudate, lentiform nuclei, internal capsule, insula, M1-M3 cortex): 7 - Supraganglionic infarction (M4-M6 cortex): 3 Total score (0-10 with 10 being normal): 10 IMPRESSION: 1. Normal head CT for age. Atherosclerotic calcification of the major vessels at the base of the brain. 2. ASPECTS is 10. 3. These results were communicated to Dr. Selina Cooley At 5:00 pmon 3/20/2022by text page via the [facility] messaging system. Electronically Signed   By: Paulina Fusi  M.D.   On: 02/15/2021 17:01   CT ANGIO HEAD CODE STROKE  Result Date: 02/15/2021 CLINICAL DATA:  Visual disturbance. Acute stroke suspected. Negative head CT. EXAM: CT ANGIOGRAPHY HEAD AND NECK CT PERFUSION BRAIN TECHNIQUE: Multidetector CT imaging of the head and neck was performed using the standard protocol during bolus administration of intravenous contrast. Multiplanar CT image reconstructions and MIPs were obtained to evaluate the vascular anatomy. Carotid stenosis measurements (when applicable) are obtained utilizing NASCET criteria, using the distal internal carotid diameter as the denominator. Multiphase CT imaging of the brain was performed following IV bolus contrast injection. Subsequent parametric perfusion maps were calculated using RAPID software. CONTRAST:  OMNIPAQUE IOHEXOL 350 MG/ML SOLN COMPARISON:  Head CT earlier same day. FINDINGS: CTA NECK FINDINGS Aortic arch: Aortic atherosclerotic calcification. Branching pattern is normal without origin stenosis. Right carotid system: Common carotid artery widely patent to the bifurcation. No evidence of carotid bifurcation soft or calcified plaque. No stenosis. Some motion degradation. Left carotid system: Common carotid artery widely patent to the bifurcation. No evidence of bifurcation soft or calcified plaque. No stenosis. Some motion degradation. Vertebral arteries: Both vertebral artery origins are  widely patent. Both vertebral arteries are patent through the cervical region. Motion degradation. Skeleton: Ordinary cervical spondylosis. Other neck: No mass or adenopathy.  Negative Upper chest: Negative Review of the MIP images confirms the above findings CTA HEAD FINDINGS Anterior circulation: Both internal carotid arteries are patent through the skull base and siphon regions. There is siphon atherosclerotic calcification with stenosis estimated at 50% on the right and 30% on the left. The anterior and middle cerebral vessels are patent. No  large or medium vessel occlusion. No aneurysm or vascular malformation. Posterior circulation: Both vertebral arteries are patent to the basilar. No basilar stenosis. Superior cerebellar and posterior cerebral arteries show flow. Venous sinuses: Patent and normal. Anatomic variants: None significant. Review of the MIP images confirms the above findings CT Brain Perfusion Findings: ASPECTS: 10 CBF (<30%) Volume: 0mL Perfusion (Tmax>6.0s) volume: 0mL Mismatch Volume: 0mL Infarction Location:None IMPRESSION: 1. Some motion degradation. No large or medium vessel occlusion. Negative perfusion study. 2. Aortic atherosclerosis. 3. No carotid bifurcation stenosis. 50% stenosis of the right carotid siphon. 30% stenosis of the left carotid siphon. Aortic Atherosclerosis (ICD10-I70.0). Electronically Signed   By: Paulina Fusi M.D.   On: 02/15/2021 17:54   CT ANGIO NECK CODE STROKE  Result Date: 02/15/2021 CLINICAL DATA:  Visual disturbance. Acute stroke suspected. Negative head CT. EXAM: CT ANGIOGRAPHY HEAD AND NECK CT PERFUSION BRAIN TECHNIQUE: Multidetector CT imaging of the head and neck was performed using the standard protocol during bolus administration of intravenous contrast. Multiplanar CT image reconstructions and MIPs were obtained to evaluate the vascular anatomy. Carotid stenosis measurements (when applicable) are obtained utilizing NASCET criteria, using the distal internal carotid diameter as the denominator. Multiphase CT imaging of the brain was performed following IV bolus contrast injection. Subsequent parametric perfusion maps were calculated using RAPID software. CONTRAST:  OMNIPAQUE IOHEXOL 350 MG/ML SOLN COMPARISON:  Head CT earlier same day. FINDINGS: CTA NECK FINDINGS Aortic arch: Aortic atherosclerotic calcification. Branching pattern is normal without origin stenosis. Right carotid system: Common carotid artery widely patent to the bifurcation. No evidence of carotid bifurcation soft or  calcified plaque. No stenosis. Some motion degradation. Left carotid system: Common carotid artery widely patent to the bifurcation. No evidence of bifurcation soft or calcified plaque. No stenosis. Some motion degradation. Vertebral arteries: Both vertebral artery origins are widely patent. Both vertebral arteries are patent through the cervical region. Motion degradation. Skeleton: Ordinary cervical spondylosis. Other neck: No mass or adenopathy.  Negative Upper chest: Negative Review of the MIP images confirms the above findings CTA HEAD FINDINGS Anterior circulation: Both internal carotid arteries are patent through the skull base and siphon regions. There is siphon atherosclerotic calcification with stenosis estimated at 50% on the right and 30% on the left. The anterior and middle cerebral vessels are patent. No large or medium vessel occlusion. No aneurysm or vascular malformation. Posterior circulation: Both vertebral arteries are patent to the basilar. No basilar stenosis. Superior cerebellar and posterior cerebral arteries show flow. Venous sinuses: Patent and normal. Anatomic variants: None significant. Review of the MIP images confirms the above findings CT Brain Perfusion Findings: ASPECTS: 10 CBF (<30%) Volume: 0mL Perfusion (Tmax>6.0s) volume: 0mL Mismatch Volume: 0mL Infarction Location:None IMPRESSION: 1. Some motion degradation. No large or medium vessel occlusion. Negative perfusion study. 2. Aortic atherosclerosis. 3. No carotid bifurcation stenosis. 50% stenosis of the right carotid siphon. 30% stenosis of the left carotid siphon. Aortic Atherosclerosis (ICD10-I70.0). Electronically Signed   By: Paulina Fusi M.D.   On: 02/15/2021  17:60    Patient Profile     60 y.o. female with a history of mild aortic stenosis as of 2019, tobacco use, previous stroke, hypertension, type 2 diabetes mellitus, chronic bradycardia, and reported history of previous myocardial infarction now presenting with  chest discomfort  Assessment & Plan    1.  Unstable angina.  - hx exertional chest tightness - trop ok, ECG w/ no new abnormalities - cath planned when o/w medically stable >> w/u for stroke first  2.  Chronic sinus bradycardia - HR generally in the 50s-60s since 2017 - however, this admit, HR 40s and high 30s much of the time - describes orthostatic dizziness - pta on Verapamil 240 mg qd, d/c'd - still on Clonidine 0.3 mg  Tid - will decrease to 0.2 mg tid and follow HR, may need to taper further and d/c altogether  3.  Aortic stenosis, mild by outside echocardiogram in 2019. - echo results above, no gradients given, sclerosis w/out stenosis noted  4.  Reported history of previous "heart attack."  - doubtful, as this was not listed in Care Everywhere or any [facility] notes  5.  Previous history of stroke/TIA with ?CVA 03/20 pm. - sx happened yesterday afternoon - MRI and CT w/out acute intracranial abnormality - stroke team to see  6.  Type 2 diabetes mellitus on multimodal treatment as per history and physical documentation.  - A1c 6.3% - off Tresiba 20 u qhs, Novolog 5 u tid ac and Janumet - on SSI - CBG < 150  7. ? ASA allergy  - however, pt takes daily  - continue this  8. Hypokalemia - supplement  Signed, Theodore Demark, PA-C  02/16/2021, 9:38 AM

## 2021-02-16 NOTE — Evaluation (Signed)
Occupational Therapy Evaluation Patient Details Name: Amber Harris MRN: [id number] DOB: July 24, 1961 Today's Date: 02/16/2021    History of Present Illness Patient is a 60 y/o female who presents on 02/16/32 with chest pain and bradycardia; code stroke called 3/20 due to visual changes and decreased arousal. NIH:4 Head CT and MRI-unremarkable. Neuro workup pending. Plan for cardiac cath pending stroke workup. PMH includes mild aortic stenosis as of 2019, tobacco use, previous CVA, HTN, DM2, chronic bradycardia, and reported history of previous MI.   Clinical Impression   PTA, pt was living at home alone with 10steps to enter home (appears pt is on the basement level), pt reports she was independent with ADL/IADL and modified independent with functional mobility at rollator level. Pt reports hx of falling on stairs leading to her apartment. Pt currently requires minguard assistance for functional mobility at RW level. She requires minA for LB dressing and would benefit from further education regarding use of AE to complete dressing. Due to decline in current level of function, pt would benefit from acute OT to address established goals to facilitate safe D/C to venue listed below. At this time, recommend HHOT follow-up as pt reports her son works [employer] and is limited with his availability to drive her to appointments. Will continue to follow acutely.     Follow Up Recommendations  Home health OT;Supervision - Intermittent    Equipment Recommendations  Hospital bed    Recommendations for Other Services       Precautions / Restrictions Precautions Precautions: Fall;Other (comment) Precaution Comments: reports hx of falls, bradycardia Restrictions Weight Bearing Restrictions: No      Mobility Bed Mobility Overal bed mobility: Needs Assistance Bed Mobility: Supine to Sit;Sit to Supine     Supine to sit: Supervision;HOB elevated Sit to supine: Supervision;HOB elevated   General  bed mobility comments: pt returning from bathroom upon arrival    Transfers Overall transfer level: Needs assistance Equipment used: Rolling walker (2 wheeled) Transfers: Sit to/from Stand Sit to Stand: Min guard         General transfer comment: Min guard for safety.    Balance Overall balance assessment: History of Falls;Needs assistance Sitting-balance support: Feet supported;No upper extremity supported Sitting balance-Leahy Scale: Good Sitting balance - Comments: Needs total A to donn socks as pt normally uses reacher and DME   Standing balance support: During functional activity Standing balance-Leahy Scale: Poor Standing balance comment: Requires UE support in standing.                           ADL either performed or assessed with clinical judgement   ADL Overall ADL's : Needs assistance/impaired Eating/Feeding: Independent   Grooming: Supervision/safety;Standing   Upper Body Bathing: Supervision/ safety   Lower Body Bathing: Min guard;Sit to/from stand   Upper Body Dressing : Supervision/safety   Lower Body Dressing: Min guard;Sit to/from stand Lower Body Dressing Details (indicate cue type and reason): pt may benefit from AE, began education on use of AE to assist with LB dressing Toilet Transfer: Min guard;Ambulation Toilet Transfer Details (indicate cue type and reason): assistance for pole management and minguard for safety         Functional mobility during ADLs: Min guard;Rolling walker General ADL Comments: pt with increased DoE 2/4 following return from bathroom. HR 43at rest 62bpm with exertion     Vision         Perception     Praxis  Pertinent Vitals/Pain Pain Assessment: No/denies pain Faces Pain Scale: No hurt     Hand Dominance Right   Extremity/Trunk Assessment Upper Extremity Assessment Upper Extremity Assessment: Overall WFL for tasks assessed   Lower Extremity Assessment Lower Extremity Assessment: Defer  to PT evaluation LLE Deficits / Details: Grossly ~3+/5 throughout, reports weaker than RLE but this is not new, "i think it is from my old stroke." LLE Sensation: WNL LLE Coordination: WNL   Cervical / Trunk Assessment Cervical / Trunk Assessment: Normal   Communication Communication Communication: No difficulties   Cognition Arousal/Alertness: Awake/alert Behavior During Therapy: WFL for tasks assessed/performed Overall Cognitive Status: Within Functional Limits for tasks assessed                                 General Comments: did not formally assess, pt would benefit from further assessment as she reports she has difficulty with medication management at baseline.   General Comments  Bradycardic at rest in low 50s.    Exercises     Shoulder Instructions      Home Living Family/patient expects to be discharged to:: Private residence Living Arrangements: Alone Available Help at Discharge: Family;Available PRN/intermittently Type of Home: Apartment Home Access: Stairs to enter Entrance Stairs-Number of Steps: 10 steps to enter   Home Layout: One level     Bathroom Shower/Tub: Chief Strategy Officer: Standard     Home Equipment: Environmental consultant - 2 wheels;Bedside commode;Cane - single point;Shower seat;Grab bars - tub/shower          Prior Functioning/Environment Level of Independence: Independent with assistive device(s)        Comments: uses RW vs SPC for ambulation. Reports lots of falls. Son lives close by. Does not drive.        OT Problem List: Decreased activity tolerance;Impaired balance (sitting and/or standing);Decreased safety awareness;Decreased knowledge of use of DME or AE;Decreased knowledge of precautions;Cardiopulmonary status limiting activity;Obesity      OT Treatment/Interventions: Self-care/ADL training;Therapeutic exercise;Energy conservation;DME and/or AE instruction;Therapeutic activities;Patient/family  education;Balance training    OT Goals(Current goals can be found in the care plan section) Acute Rehab OT Goals Patient Stated Goal: to get better and go home OT Goal Formulation: With patient Time For Goal Achievement: 03/02/21 Potential to Achieve Goals: Good ADL Goals Pt Will Perform Grooming: with supervision;standing Pt Will Perform Lower Body Dressing: with supervision;sit to/from stand;with adaptive equipment Pt Will Transfer to Toilet: with modified independence;ambulating Additional ADL Goal #1: Pt will verbalize 3 fall prevention strategies for safe engagement of ADL/IADL and functional mobility.  OT Frequency: Min 2X/week   Barriers to D/C:            Co-evaluation              AM-PAC OT "6 Clicks" Daily Activity     Outcome Measure Help from another person eating meals?: None Help from another person taking care of personal grooming?: A Little Help from another person toileting, which includes using toliet, bedpan, or urinal?: A Little Help from another person bathing (including washing, rinsing, drying)?: A Little Help from another person to put on and taking off regular upper body clothing?: A Little Help from another person to put on and taking off regular lower body clothing?: A Little 6 Click Score: 19   End of Session Equipment Utilized During Treatment: Rolling walker Nurse Communication: Mobility status  Activity Tolerance: Patient tolerated treatment well Patient  left: in chair;with call bell/phone within reach  OT Visit Diagnosis: Unsteadiness on feet (R26.81);Other abnormalities of gait and mobility (R26.89);Muscle weakness (generalized) (M62.81);History of falling (Z91.81);Other symptoms and signs involving cognitive function                Time: 1610-9604 OT Time Calculation (min): 19 min Charges:  OT General Charges $OT Visit: 1 Visit OT Evaluation $OT Eval Moderate Complexity: 1 Mod  Milania Haubner OTR/L Acute Rehabilitation Services Office:  [phone]   [name] Alert 02/16/2021, 1:52 PM

## 2021-02-17 ENCOUNTER — Inpatient Hospital Stay (HOSPITAL_COMMUNITY): Admission: AD | Disposition: A | Discharge status: Home / Self Care | Payer: Self-pay | Attending: Cardiology

## 2021-02-17 DIAGNOSIS — R079 Chest pain, unspecified: Secondary | ICD-10-CM

## 2021-02-17 DIAGNOSIS — I10 Essential (primary) hypertension: Secondary | ICD-10-CM

## 2021-02-17 HISTORY — PX: LEFT HEART CATH AND CORONARY ANGIOGRAPHY: CATH118249

## 2021-02-17 LAB — BASIC METABOLIC PANEL
Anion gap: 8 (ref 5–15)
BUN: 22 mg/dL — ABNORMAL HIGH (ref 6–20)
CO2: 29 mmol/L (ref 22–32)
Calcium: 9.1 mg/dL (ref 8.9–10.3)
Chloride: 100 mmol/L (ref 98–111)
Creatinine, Ser: 1.2 mg/dL — ABNORMAL HIGH (ref 0.44–1.00)
GFR, Estimated: 52 mL/min — ABNORMAL LOW (ref >60)
Glucose, Bld: 115 mg/dL — ABNORMAL HIGH (ref 70–99)
Potassium: 3.1 mmol/L — ABNORMAL LOW (ref 3.5–5.1)
Sodium: 137 mmol/L (ref 135–145)

## 2021-02-17 LAB — CBC
HCT: 36.9 % (ref 36.0–46.0)
Hemoglobin: 12.5 g/dL (ref 12.0–15.0)
MCH: 31.3 pg (ref 26.0–34.0)
MCHC: 33.9 g/dL (ref 30.0–36.0)
MCV: 92.3 fL (ref 80.0–100.0)
Platelets: 146 10*3/uL — ABNORMAL LOW (ref 150–400)
RBC: 4 MIL/uL (ref 3.87–5.11)
RDW: 12.2 % (ref 11.5–15.5)
WBC: 6.6 10*3/uL (ref 4.0–10.5)
nRBC: 0 % (ref 0.0–0.2)

## 2021-02-17 LAB — GLUCOSE, CAPILLARY
Glucose-Capillary: 158 mg/dL — ABNORMAL HIGH (ref 70–99)
Glucose-Capillary: 136 mg/dL — ABNORMAL HIGH (ref 70–99)
Glucose-Capillary: 97 mg/dL (ref 70–99)

## 2021-02-17 LAB — HEPARIN LEVEL (UNFRACTIONATED): Heparin Unfractionated: 0.57 IU/mL (ref 0.30–0.70)

## 2021-02-17 SURGERY — LEFT HEART CATH AND CORONARY ANGIOGRAPHY
Anesthesia: LOCAL

## 2021-02-17 MED ORDER — VERAPAMIL HCL 2.5 MG/ML IV SOLN
INTRAVENOUS | Status: DC | PRN
  Administered 2021-02-17: 10 mL via INTRA_ARTERIAL

## 2021-02-17 MED ORDER — LABETALOL HCL 5 MG/ML IV SOLN
10.0000 mg | INTRAVENOUS | Status: DC | PRN
Start: 2021-02-17 — End: 2021-02-17

## 2021-02-17 MED ORDER — POTASSIUM CHLORIDE CRYS ER 20 MEQ PO TBCR
40.0000 meq | EXTENDED_RELEASE_TABLET | Freq: Once | ORAL | Status: AC
  Administered 2021-02-17: 40 meq via ORAL
  Filled 2021-02-17: qty 2

## 2021-02-17 MED ORDER — CLONIDINE HCL 0.2 MG PO TABS
0.2000 mg | ORAL_TABLET | Freq: Three times a day (TID) | ORAL | 2 refills | Status: DC
Start: 2021-02-17 — End: 2023-01-19

## 2021-02-17 MED ORDER — SODIUM CHLORIDE 0.9 % WEIGHT BASED INFUSION
1.0000 mL/kg/h | INTRAVENOUS | Status: DC
  Administered 2021-02-17: 1 mL/kg/h via INTRAVENOUS

## 2021-02-17 MED ORDER — ASPIRIN EC 81 MG PO TBEC: 81.0000 mg | DELAYED_RELEASE_TABLET | Freq: Every day | ORAL | Status: DC

## 2021-02-17 MED ORDER — FUROSEMIDE 40 MG PO TABS: 40.0000 mg | ORAL_TABLET | Freq: Every day | ORAL | 2 refills | Status: DC

## 2021-02-17 MED ORDER — HEPARIN SODIUM (PORCINE) 1000 UNIT/ML IJ SOLN
INTRAMUSCULAR | Status: AC
  Filled 2021-02-17: qty 1

## 2021-02-17 MED ORDER — MIDAZOLAM HCL 2 MG/2ML IJ SOLN
INTRAMUSCULAR | Status: DC | PRN
  Administered 2021-02-17: 2 mg via INTRAVENOUS

## 2021-02-17 MED ORDER — VERAPAMIL HCL 2.5 MG/ML IV SOLN
INTRAVENOUS | Status: AC
  Filled 2021-02-17: qty 2

## 2021-02-17 MED ORDER — HEPARIN SODIUM (PORCINE) 1000 UNIT/ML IJ SOLN
INTRAMUSCULAR | Status: DC | PRN
  Administered 2021-02-17: 5000 [IU] via INTRAVENOUS

## 2021-02-17 MED ORDER — FENTANYL CITRATE (PF) 100 MCG/2ML IJ SOLN
INTRAMUSCULAR | Status: AC
  Filled 2021-02-17: qty 2

## 2021-02-17 MED ORDER — HYDRALAZINE HCL 20 MG/ML IJ SOLN: 10.0000 mg | INTRAMUSCULAR | Status: DC | PRN

## 2021-02-17 MED ORDER — MIDAZOLAM HCL 5 MG/5ML IJ SOLN
INTRAMUSCULAR | Status: AC
  Filled 2021-02-17: qty 5

## 2021-02-17 MED ORDER — LIDOCAINE HCL 1 % IJ SOLN
INTRAMUSCULAR | Status: AC
  Filled 2021-02-17: qty 20

## 2021-02-17 MED ORDER — FENTANYL CITRATE (PF) 100 MCG/2ML IJ SOLN
INTRAMUSCULAR | Status: DC | PRN
  Administered 2021-02-17: 25 ug via INTRAVENOUS

## 2021-02-17 MED ORDER — HEPARIN (PORCINE) IN NACL 1000-0.9 UT/500ML-% IV SOLN
INTRAVENOUS | Status: DC | PRN
  Administered 2021-02-17 (×2): 500 mL

## 2021-02-17 MED ORDER — ASPIRIN 81 MG PO CHEW
81.0000 mg | CHEWABLE_TABLET | Freq: Once | ORAL | Status: AC
  Administered 2021-02-17: 81 mg via ORAL
  Filled 2021-02-17: qty 1

## 2021-02-17 MED ORDER — IOHEXOL 350 MG/ML SOLN
INTRAVENOUS | Status: DC | PRN
  Administered 2021-02-17: 55 mL via INTRA_ARTERIAL

## 2021-02-17 MED ORDER — HEPARIN (PORCINE) IN NACL 1000-0.9 UT/500ML-% IV SOLN
INTRAVENOUS | Status: AC
  Filled 2021-02-17: qty 1000

## 2021-02-17 MED ORDER — LIDOCAINE HCL (PF) 1 % IJ SOLN
INTRAMUSCULAR | Status: DC | PRN
  Administered 2021-02-17: 2 mL

## 2021-02-17 SURGICAL SUPPLY — 9 items
CATH IMPULSE 5F ANG/FL3.5 (CATHETERS) ×1 IMPLANT
DEVICE RAD COMP TR BAND LRG (VASCULAR PRODUCTS) ×1 IMPLANT
GLIDESHEATH SLEND SS 6F .021 (SHEATH) ×1 IMPLANT
GUIDEWIRE INQWIRE 1.5J.035X260 (WIRE) IMPLANT
INQWIRE 1.5J .035X260CM (WIRE) ×2
KIT HEART LEFT (KITS) ×2 IMPLANT
PACK CARDIAC CATHETERIZATION (CUSTOM PROCEDURE TRAY) ×2 IMPLANT
TRANSDUCER W/STOPCOCK (MISCELLANEOUS) ×2 IMPLANT
TUBING CIL FLEX 10 FLL-RA (TUBING) ×2 IMPLANT

## 2021-02-17 NOTE — Discharge Instructions (Signed)
No driving for 24 hours. No lifting over 5 lbs for 1 week. No sexual activity for 1 week. Keep procedure site clean & dry. If you notice increased pain, swelling, bleeding or pus, call/return!  You may shower, but no soaking baths/hot tubs/pools for 1 week.      NO JANUMET FOR 2 DAYS   Radial Site Care  This sheet gives you information about how to care for yourself after your procedure. Your health care provider may also give you more specific instructions. If you have problems or questions, contact your health care provider. What can I expect after the procedure? After the procedure, it is common to have:  Bruising and tenderness at the catheter insertion area. Follow these instructions at home: Medicines  Take over-the-counter and prescription medicines only as told by your health care provider. Insertion site care  Follow instructions from your health care provider about how to take care of your insertion site. Make sure you: ? Wash your hands with soap and water before you change your bandage (dressing). If soap and water are not available, use hand sanitizer. ? Change your dressing as told by your health care provider. ? Leave stitches (sutures), skin glue, or adhesive strips in place. These skin closures may need to stay in place for 2 weeks or longer. If adhesive strip edges start to loosen and curl up, you may trim the loose edges. Do not remove adhesive strips completely unless your health care provider tells you to do that.  Check your insertion site every day for signs of infection. Check for: ? Redness, swelling, or pain. ? Fluid or blood. ? Pus or a bad smell. ? Warmth.  Do not take baths, swim, or use a hot tub until your health care provider approves.  You may shower 24-48 hours after the procedure, or as directed by your health care provider. ? Remove the dressing and gently wash the site with plain soap and water. ? Pat the area dry with a clean towel. ? Do not  rub the site. That could cause bleeding.  Do not apply powder or lotion to the site. Activity  For 24 hours after the procedure, or as directed by your health care provider: ? Do not flex or bend the affected arm. ? Do not push or pull heavy objects with the affected arm. ? Do not drive yourself home from the hospital or clinic. You may drive 24 hours after the procedure unless your health care provider tells you not to. ? Do not operate machinery or power tools.  Do not lift anything that is heavier than 10 lb (4.5 kg), or the limit that you are told, until your health care provider says that it is safe.  Ask your health care provider when it is okay to: ? Return to work or school. ? Resume usual physical activities or sports. ? Resume sexual activity.   General instructions  If the catheter site starts to bleed, raise your arm and put firm pressure on the site. If the bleeding does not stop, get help right away. This is a medical emergency.  If you went home on the same day as your procedure, a responsible adult should be with you for the first 24 hours after you arrive home.  Keep all follow-up visits as told by your health care provider. This is important. Contact a health care provider if:  You have a fever.  You have redness, swelling, or yellow drainage around your insertion site.  Get help right away if:  You have unusual pain at the radial site.  The catheter insertion area swells very fast.  The insertion area is bleeding, and the bleeding does not stop when you hold steady pressure on the area.  Your arm or hand becomes pale, cool, tingly, or numb. These symptoms may represent a serious problem that is an emergency. Do not wait to see if the symptoms will go away. Get medical help right away. Call your local emergency services (911 in the [place] Do not drive yourself to the hospital. Summary  After the procedure, it is common to have bruising and tenderness at the  site.  Follow instructions from your health care provider about how to take care of your radial site wound. Check the wound every day for signs of infection.  Do not lift anything that is heavier than 10 lb (4.5 kg), or the limit that you are told, until your health care provider says that it is safe. This information is not intended to replace advice given to you by your health care provider. Make sure you discuss any questions you have with your health care provider. Document Revised: 12/21/2017 Document Reviewed: 12/21/2017 Elsevier Patient Education  2021 [employer].

## 2021-02-17 NOTE — Discharge Summary (Signed)
Both vertebral artery origins are widely patent. Both vertebral arteries are patent through the cervical region. Motion degradation. Skeleton: Ordinary cervical spondylosis. Other neck: No mass or adenopathy.  Negative Upper chest: Negative Review of the MIP images confirms the above findings CTA HEAD FINDINGS Anterior circulation: Both internal carotid arteries are patent through the skull base and siphon regions. There is siphon atherosclerotic calcification with stenosis estimated at 50% on the right and 30% on the left. The anterior and middle cerebral vessels are patent. No large or medium vessel occlusion. No aneurysm or vascular malformation. Posterior circulation: Both vertebral arteries are patent to the basilar. No basilar stenosis. Superior cerebellar and posterior cerebral arteries show flow. Venous sinuses: Patent and normal. Anatomic variants: None significant. Review of the MIP images confirms the above findings CT Brain Perfusion Findings: ASPECTS: 10 CBF (<30%) Volume: 28mL Perfusion (Tmax>6.0s) volume: 65mL Mismatch Volume: 50mL Infarction Location:None IMPRESSION: 1. Some motion degradation. No large or medium vessel occlusion. Negative perfusion study.  2. Aortic atherosclerosis. 3. No carotid bifurcation stenosis. 50% stenosis of the right carotid siphon. 30% stenosis of the left carotid siphon. Aortic Atherosclerosis (ICD10-I70.0). Electronically Signed   By: Nelson Chimes M.D.   On: 02/15/2021 17:54   ECHOCARDIOGRAM COMPLETE  Result Date: 02/15/2021    ECHOCARDIOGRAM REPORT   Patient Name:   Amber Harris Date of Exam: 02/15/2021 Medical Rec #:  [id number]        Height:       69.0 in Accession #:    [id number]       Weight:       248.6 lb Date of Birth:  1961-09-09        BSA:          2.266 m Patient Age:    60 years         BP:           128/69 mmHg Patient Gender: F                HR:           48 bpm. Exam Location:  Inpatient Procedure: 2D Echo, Cardiac Doppler, Color Doppler and Intracardiac            Opacification Agent Indications:    Chest pain  History:        Patient has prior history of Echocardiogram examinations, most                 recent 05/22/2018. CAD, COPD; Risk Factors:Diabetes and                 Hypertension. H/O stroke.  Sonographer:    Clayton Lefort RDCS (AE) Referring Phys: [id number] [place]  Sonographer Comments: Patient is morbidly obese. Image acquisition challenging due to patient body habitus and Image acquisition challenging due to COPD. IMPRESSIONS  1. Left ventricular ejection fraction, by estimation, is 60 to 65%. The left ventricle has normal function. The left ventricle has no regional wall motion abnormalities. There is moderate concentric left ventricular hypertrophy. Left ventricular diastolic parameters are indeterminate. Elevated left ventricular end-diastolic pressure.  2. Right ventricular systolic function is normal. The right ventricular size is normal. Tricuspid regurgitation signal is inadequate for assessing PA pressure.  3. The mitral valve is normal in structure. Trivial mitral valve regurgitation. No evidence of mitral stenosis.  4. The aortic valve is calcified. Aortic valve regurgitation is  mild. Mild to moderate aortic valve sclerosis/calcification is present, without any evidence of  Discharge Summary    Patient ID: Amber Harris MRN: [id number]; DOB: 08-26-1961  Admit date: 02/14/2021 Discharge date: 02/17/2021  PCP:  Neale Burly, MD   [place]  Cardiologist:  Carlyle Dolly, MD  Advanced Practice Provider:  No care team member to display Electrophysiologist:  None    Discharge Diagnoses    Principal Problem:   Chest pain Active Problems:   Diabetes mellitus, type II ([place])   Hypertension   Obesity   Tobacco user   Hyperlipidemia LDL goal <100    Diagnostic Studies/Procedures     Echo 02/15/2021 1. Left ventricular ejection fraction, by estimation, is 60 to 65%. The  left ventricle has normal function. The left ventricle has no regional  wall motion abnormalities. There is moderate concentric left ventricular  hypertrophy. Left ventricular  diastolic parameters are indeterminate. Elevated left ventricular  end-diastolic pressure.  2. Right ventricular systolic function is normal. The right ventricular  size is normal. Tricuspid regurgitation signal is inadequate for assessing  PA pressure.  3. The mitral valve is normal in structure. Trivial mitral valve  regurgitation. No evidence of mitral stenosis.  4. The aortic valve is calcified. Aortic valve regurgitation is mild.  Mild to moderate aortic valve sclerosis/calcification is present, without  any evidence of aortic stenosis. Focal dense calcification of the left  coornary cusp is present. Aortic  regurgitation PHT measures 543 msec.  5. The inferior vena cava is dilated in size with <50% respiratory  variability, suggesting right atrial pressure of 15 mmHg.    MRI of brain 02/16/2021 IMPRESSION: 1. No acute intracranial abnormality. 2. Chronic small vessel disease with minimal progression from an MRI last year.   Cath 02/17/2021 1. Widely patent coronary arteries with no obstructive CAD 2. Normal LV function  Suspect noncardiac  symptoms Diagnostic Dominance: Right    Intervention    _____________   History of Present Illness     Amber Harris is a 60 y.o. female with history of asthma, ASD, COPD, depression, DM, CAD, stroke, HTN presenting with chest pain and bradycardia.    Amber Harris reports sudden onset chest pain starting earlier today. She had just finished some housework when she noticed this start. Pain was center of the chest and described as sharp. Non-radiating. No pain in jaw or arm. No abdominal pain. She states this felt just like a heart attack she had in previous years, although doesn't remember ever having a heart cath or a stent placed. She came to an OSH and noted to have nonspecific ST abnormalities on ECG. An initial troponin was 10 and a 2 hour delta was 0. Her d-dimer was elevated and a CTA was performed which was negative for PE. A follow up ECG revealed sinus bradycardia with HR 46 that was not initially present on admission. She notably did not have any known history of bradycardia or arrhythmia. She was transferred to [facility] for further workup and treatment.   Currently the patient states that her chest pain has started to come back. She again describes this as central chest pain, stabbing and sharp in character. It is rated at around a 4 out of 10. The pain had nearly resolved earlier in the day when she was given nitroglycerin.   The patient also notes she has a longstanding history of dizziness. She has had 2 episodes of syncope that she states both occurred earlier this year, however review of her chart reveals a longstanding diagnosis  Discharge Summary    Patient ID: Amber Harris MRN: [id number]; DOB: 08-26-1961  Admit date: 02/14/2021 Discharge date: 02/17/2021  PCP:  Neale Burly, MD   [place]  Cardiologist:  Carlyle Dolly, MD  Advanced Practice Provider:  No care team member to display Electrophysiologist:  None    Discharge Diagnoses    Principal Problem:   Chest pain Active Problems:   Diabetes mellitus, type II ([place])   Hypertension   Obesity   Tobacco user   Hyperlipidemia LDL goal <100    Diagnostic Studies/Procedures     Echo 02/15/2021 1. Left ventricular ejection fraction, by estimation, is 60 to 65%. The  left ventricle has normal function. The left ventricle has no regional  wall motion abnormalities. There is moderate concentric left ventricular  hypertrophy. Left ventricular  diastolic parameters are indeterminate. Elevated left ventricular  end-diastolic pressure.  2. Right ventricular systolic function is normal. The right ventricular  size is normal. Tricuspid regurgitation signal is inadequate for assessing  PA pressure.  3. The mitral valve is normal in structure. Trivial mitral valve  regurgitation. No evidence of mitral stenosis.  4. The aortic valve is calcified. Aortic valve regurgitation is mild.  Mild to moderate aortic valve sclerosis/calcification is present, without  any evidence of aortic stenosis. Focal dense calcification of the left  coornary cusp is present. Aortic  regurgitation PHT measures 543 msec.  5. The inferior vena cava is dilated in size with <50% respiratory  variability, suggesting right atrial pressure of 15 mmHg.    MRI of brain 02/16/2021 IMPRESSION: 1. No acute intracranial abnormality. 2. Chronic small vessel disease with minimal progression from an MRI last year.   Cath 02/17/2021 1. Widely patent coronary arteries with no obstructive CAD 2. Normal LV function  Suspect noncardiac  symptoms Diagnostic Dominance: Right    Intervention    _____________   History of Present Illness     Amber Harris is a 60 y.o. female with history of asthma, ASD, COPD, depression, DM, CAD, stroke, HTN presenting with chest pain and bradycardia.    Amber Harris reports sudden onset chest pain starting earlier today. She had just finished some housework when she noticed this start. Pain was center of the chest and described as sharp. Non-radiating. No pain in jaw or arm. No abdominal pain. She states this felt just like a heart attack she had in previous years, although doesn't remember ever having a heart cath or a stent placed. She came to an OSH and noted to have nonspecific ST abnormalities on ECG. An initial troponin was 10 and a 2 hour delta was 0. Her d-dimer was elevated and a CTA was performed which was negative for PE. A follow up ECG revealed sinus bradycardia with HR 46 that was not initially present on admission. She notably did not have any known history of bradycardia or arrhythmia. She was transferred to [facility] for further workup and treatment.   Currently the patient states that her chest pain has started to come back. She again describes this as central chest pain, stabbing and sharp in character. It is rated at around a 4 out of 10. The pain had nearly resolved earlier in the day when she was given nitroglycerin.   The patient also notes she has a longstanding history of dizziness. She has had 2 episodes of syncope that she states both occurred earlier this year, however review of her chart reveals a longstanding diagnosis  Discharge Summary    Patient ID: Amber Harris MRN: [id number]; DOB: 08-26-1961  Admit date: 02/14/2021 Discharge date: 02/17/2021  PCP:  Neale Burly, MD   [place]  Cardiologist:  Carlyle Dolly, MD  Advanced Practice Provider:  No care team member to display Electrophysiologist:  None    Discharge Diagnoses    Principal Problem:   Chest pain Active Problems:   Diabetes mellitus, type II ([place])   Hypertension   Obesity   Tobacco user   Hyperlipidemia LDL goal <100    Diagnostic Studies/Procedures     Echo 02/15/2021 1. Left ventricular ejection fraction, by estimation, is 60 to 65%. The  left ventricle has normal function. The left ventricle has no regional  wall motion abnormalities. There is moderate concentric left ventricular  hypertrophy. Left ventricular  diastolic parameters are indeterminate. Elevated left ventricular  end-diastolic pressure.  2. Right ventricular systolic function is normal. The right ventricular  size is normal. Tricuspid regurgitation signal is inadequate for assessing  PA pressure.  3. The mitral valve is normal in structure. Trivial mitral valve  regurgitation. No evidence of mitral stenosis.  4. The aortic valve is calcified. Aortic valve regurgitation is mild.  Mild to moderate aortic valve sclerosis/calcification is present, without  any evidence of aortic stenosis. Focal dense calcification of the left  coornary cusp is present. Aortic  regurgitation PHT measures 543 msec.  5. The inferior vena cava is dilated in size with <50% respiratory  variability, suggesting right atrial pressure of 15 mmHg.    MRI of brain 02/16/2021 IMPRESSION: 1. No acute intracranial abnormality. 2. Chronic small vessel disease with minimal progression from an MRI last year.   Cath 02/17/2021 1. Widely patent coronary arteries with no obstructive CAD 2. Normal LV function  Suspect noncardiac  symptoms Diagnostic Dominance: Right    Intervention    _____________   History of Present Illness     Amber Harris is a 60 y.o. female with history of asthma, ASD, COPD, depression, DM, CAD, stroke, HTN presenting with chest pain and bradycardia.    Amber Harris reports sudden onset chest pain starting earlier today. She had just finished some housework when she noticed this start. Pain was center of the chest and described as sharp. Non-radiating. No pain in jaw or arm. No abdominal pain. She states this felt just like a heart attack she had in previous years, although doesn't remember ever having a heart cath or a stent placed. She came to an OSH and noted to have nonspecific ST abnormalities on ECG. An initial troponin was 10 and a 2 hour delta was 0. Her d-dimer was elevated and a CTA was performed which was negative for PE. A follow up ECG revealed sinus bradycardia with HR 46 that was not initially present on admission. She notably did not have any known history of bradycardia or arrhythmia. She was transferred to [facility] for further workup and treatment.   Currently the patient states that her chest pain has started to come back. She again describes this as central chest pain, stabbing and sharp in character. It is rated at around a 4 out of 10. The pain had nearly resolved earlier in the day when she was given nitroglycerin.   The patient also notes she has a longstanding history of dizziness. She has had 2 episodes of syncope that she states both occurred earlier this year, however review of her chart reveals a longstanding diagnosis  Both vertebral artery origins are widely patent. Both vertebral arteries are patent through the cervical region. Motion degradation. Skeleton: Ordinary cervical spondylosis. Other neck: No mass or adenopathy.  Negative Upper chest: Negative Review of the MIP images confirms the above findings CTA HEAD FINDINGS Anterior circulation: Both internal carotid arteries are patent through the skull base and siphon regions. There is siphon atherosclerotic calcification with stenosis estimated at 50% on the right and 30% on the left. The anterior and middle cerebral vessels are patent. No large or medium vessel occlusion. No aneurysm or vascular malformation. Posterior circulation: Both vertebral arteries are patent to the basilar. No basilar stenosis. Superior cerebellar and posterior cerebral arteries show flow. Venous sinuses: Patent and normal. Anatomic variants: None significant. Review of the MIP images confirms the above findings CT Brain Perfusion Findings: ASPECTS: 10 CBF (<30%) Volume: 28mL Perfusion (Tmax>6.0s) volume: 65mL Mismatch Volume: 50mL Infarction Location:None IMPRESSION: 1. Some motion degradation. No large or medium vessel occlusion. Negative perfusion study.  2. Aortic atherosclerosis. 3. No carotid bifurcation stenosis. 50% stenosis of the right carotid siphon. 30% stenosis of the left carotid siphon. Aortic Atherosclerosis (ICD10-I70.0). Electronically Signed   By: Nelson Chimes M.D.   On: 02/15/2021 17:54   ECHOCARDIOGRAM COMPLETE  Result Date: 02/15/2021    ECHOCARDIOGRAM REPORT   Patient Name:   Amber Harris Date of Exam: 02/15/2021 Medical Rec #:  [id number]        Height:       69.0 in Accession #:    [id number]       Weight:       248.6 lb Date of Birth:  1961-09-09        BSA:          2.266 m Patient Age:    60 years         BP:           128/69 mmHg Patient Gender: F                HR:           48 bpm. Exam Location:  Inpatient Procedure: 2D Echo, Cardiac Doppler, Color Doppler and Intracardiac            Opacification Agent Indications:    Chest pain  History:        Patient has prior history of Echocardiogram examinations, most                 recent 05/22/2018. CAD, COPD; Risk Factors:Diabetes and                 Hypertension. H/O stroke.  Sonographer:    Clayton Lefort RDCS (AE) Referring Phys: [id number] [place]  Sonographer Comments: Patient is morbidly obese. Image acquisition challenging due to patient body habitus and Image acquisition challenging due to COPD. IMPRESSIONS  1. Left ventricular ejection fraction, by estimation, is 60 to 65%. The left ventricle has normal function. The left ventricle has no regional wall motion abnormalities. There is moderate concentric left ventricular hypertrophy. Left ventricular diastolic parameters are indeterminate. Elevated left ventricular end-diastolic pressure.  2. Right ventricular systolic function is normal. The right ventricular size is normal. Tricuspid regurgitation signal is inadequate for assessing PA pressure.  3. The mitral valve is normal in structure. Trivial mitral valve regurgitation. No evidence of mitral stenosis.  4. The aortic valve is calcified. Aortic valve regurgitation is  mild. Mild to moderate aortic valve sclerosis/calcification is present, without any evidence of  Both vertebral artery origins are widely patent. Both vertebral arteries are patent through the cervical region. Motion degradation. Skeleton: Ordinary cervical spondylosis. Other neck: No mass or adenopathy.  Negative Upper chest: Negative Review of the MIP images confirms the above findings CTA HEAD FINDINGS Anterior circulation: Both internal carotid arteries are patent through the skull base and siphon regions. There is siphon atherosclerotic calcification with stenosis estimated at 50% on the right and 30% on the left. The anterior and middle cerebral vessels are patent. No large or medium vessel occlusion. No aneurysm or vascular malformation. Posterior circulation: Both vertebral arteries are patent to the basilar. No basilar stenosis. Superior cerebellar and posterior cerebral arteries show flow. Venous sinuses: Patent and normal. Anatomic variants: None significant. Review of the MIP images confirms the above findings CT Brain Perfusion Findings: ASPECTS: 10 CBF (<30%) Volume: 28mL Perfusion (Tmax>6.0s) volume: 65mL Mismatch Volume: 50mL Infarction Location:None IMPRESSION: 1. Some motion degradation. No large or medium vessel occlusion. Negative perfusion study.  2. Aortic atherosclerosis. 3. No carotid bifurcation stenosis. 50% stenosis of the right carotid siphon. 30% stenosis of the left carotid siphon. Aortic Atherosclerosis (ICD10-I70.0). Electronically Signed   By: Nelson Chimes M.D.   On: 02/15/2021 17:54   ECHOCARDIOGRAM COMPLETE  Result Date: 02/15/2021    ECHOCARDIOGRAM REPORT   Patient Name:   Amber Harris Date of Exam: 02/15/2021 Medical Rec #:  [id number]        Height:       69.0 in Accession #:    [id number]       Weight:       248.6 lb Date of Birth:  1961-09-09        BSA:          2.266 m Patient Age:    60 years         BP:           128/69 mmHg Patient Gender: F                HR:           48 bpm. Exam Location:  Inpatient Procedure: 2D Echo, Cardiac Doppler, Color Doppler and Intracardiac            Opacification Agent Indications:    Chest pain  History:        Patient has prior history of Echocardiogram examinations, most                 recent 05/22/2018. CAD, COPD; Risk Factors:Diabetes and                 Hypertension. H/O stroke.  Sonographer:    Clayton Lefort RDCS (AE) Referring Phys: [id number] [place]  Sonographer Comments: Patient is morbidly obese. Image acquisition challenging due to patient body habitus and Image acquisition challenging due to COPD. IMPRESSIONS  1. Left ventricular ejection fraction, by estimation, is 60 to 65%. The left ventricle has normal function. The left ventricle has no regional wall motion abnormalities. There is moderate concentric left ventricular hypertrophy. Left ventricular diastolic parameters are indeterminate. Elevated left ventricular end-diastolic pressure.  2. Right ventricular systolic function is normal. The right ventricular size is normal. Tricuspid regurgitation signal is inadequate for assessing PA pressure.  3. The mitral valve is normal in structure. Trivial mitral valve regurgitation. No evidence of mitral stenosis.  4. The aortic valve is calcified. Aortic valve regurgitation is  mild. Mild to moderate aortic valve sclerosis/calcification is present, without any evidence of  Both vertebral artery origins are widely patent. Both vertebral arteries are patent through the cervical region. Motion degradation. Skeleton: Ordinary cervical spondylosis. Other neck: No mass or adenopathy.  Negative Upper chest: Negative Review of the MIP images confirms the above findings CTA HEAD FINDINGS Anterior circulation: Both internal carotid arteries are patent through the skull base and siphon regions. There is siphon atherosclerotic calcification with stenosis estimated at 50% on the right and 30% on the left. The anterior and middle cerebral vessels are patent. No large or medium vessel occlusion. No aneurysm or vascular malformation. Posterior circulation: Both vertebral arteries are patent to the basilar. No basilar stenosis. Superior cerebellar and posterior cerebral arteries show flow. Venous sinuses: Patent and normal. Anatomic variants: None significant. Review of the MIP images confirms the above findings CT Brain Perfusion Findings: ASPECTS: 10 CBF (<30%) Volume: 28mL Perfusion (Tmax>6.0s) volume: 65mL Mismatch Volume: 50mL Infarction Location:None IMPRESSION: 1. Some motion degradation. No large or medium vessel occlusion. Negative perfusion study.  2. Aortic atherosclerosis. 3. No carotid bifurcation stenosis. 50% stenosis of the right carotid siphon. 30% stenosis of the left carotid siphon. Aortic Atherosclerosis (ICD10-I70.0). Electronically Signed   By: Nelson Chimes M.D.   On: 02/15/2021 17:54   ECHOCARDIOGRAM COMPLETE  Result Date: 02/15/2021    ECHOCARDIOGRAM REPORT   Patient Name:   Amber Harris Date of Exam: 02/15/2021 Medical Rec #:  [id number]        Height:       69.0 in Accession #:    [id number]       Weight:       248.6 lb Date of Birth:  1961-09-09        BSA:          2.266 m Patient Age:    60 years         BP:           128/69 mmHg Patient Gender: F                HR:           48 bpm. Exam Location:  Inpatient Procedure: 2D Echo, Cardiac Doppler, Color Doppler and Intracardiac            Opacification Agent Indications:    Chest pain  History:        Patient has prior history of Echocardiogram examinations, most                 recent 05/22/2018. CAD, COPD; Risk Factors:Diabetes and                 Hypertension. H/O stroke.  Sonographer:    Clayton Lefort RDCS (AE) Referring Phys: [id number] [place]  Sonographer Comments: Patient is morbidly obese. Image acquisition challenging due to patient body habitus and Image acquisition challenging due to COPD. IMPRESSIONS  1. Left ventricular ejection fraction, by estimation, is 60 to 65%. The left ventricle has normal function. The left ventricle has no regional wall motion abnormalities. There is moderate concentric left ventricular hypertrophy. Left ventricular diastolic parameters are indeterminate. Elevated left ventricular end-diastolic pressure.  2. Right ventricular systolic function is normal. The right ventricular size is normal. Tricuspid regurgitation signal is inadequate for assessing PA pressure.  3. The mitral valve is normal in structure. Trivial mitral valve regurgitation. No evidence of mitral stenosis.  4. The aortic valve is calcified. Aortic valve regurgitation is  mild. Mild to moderate aortic valve sclerosis/calcification is present, without any evidence of  Both vertebral artery origins are widely patent. Both vertebral arteries are patent through the cervical region. Motion degradation. Skeleton: Ordinary cervical spondylosis. Other neck: No mass or adenopathy.  Negative Upper chest: Negative Review of the MIP images confirms the above findings CTA HEAD FINDINGS Anterior circulation: Both internal carotid arteries are patent through the skull base and siphon regions. There is siphon atherosclerotic calcification with stenosis estimated at 50% on the right and 30% on the left. The anterior and middle cerebral vessels are patent. No large or medium vessel occlusion. No aneurysm or vascular malformation. Posterior circulation: Both vertebral arteries are patent to the basilar. No basilar stenosis. Superior cerebellar and posterior cerebral arteries show flow. Venous sinuses: Patent and normal. Anatomic variants: None significant. Review of the MIP images confirms the above findings CT Brain Perfusion Findings: ASPECTS: 10 CBF (<30%) Volume: 28mL Perfusion (Tmax>6.0s) volume: 65mL Mismatch Volume: 50mL Infarction Location:None IMPRESSION: 1. Some motion degradation. No large or medium vessel occlusion. Negative perfusion study.  2. Aortic atherosclerosis. 3. No carotid bifurcation stenosis. 50% stenosis of the right carotid siphon. 30% stenosis of the left carotid siphon. Aortic Atherosclerosis (ICD10-I70.0). Electronically Signed   By: Nelson Chimes M.D.   On: 02/15/2021 17:54   ECHOCARDIOGRAM COMPLETE  Result Date: 02/15/2021    ECHOCARDIOGRAM REPORT   Patient Name:   Amber Harris Date of Exam: 02/15/2021 Medical Rec #:  [id number]        Height:       69.0 in Accession #:    [id number]       Weight:       248.6 lb Date of Birth:  1961-09-09        BSA:          2.266 m Patient Age:    60 years         BP:           128/69 mmHg Patient Gender: F                HR:           48 bpm. Exam Location:  Inpatient Procedure: 2D Echo, Cardiac Doppler, Color Doppler and Intracardiac            Opacification Agent Indications:    Chest pain  History:        Patient has prior history of Echocardiogram examinations, most                 recent 05/22/2018. CAD, COPD; Risk Factors:Diabetes and                 Hypertension. H/O stroke.  Sonographer:    Clayton Lefort RDCS (AE) Referring Phys: [id number] [place]  Sonographer Comments: Patient is morbidly obese. Image acquisition challenging due to patient body habitus and Image acquisition challenging due to COPD. IMPRESSIONS  1. Left ventricular ejection fraction, by estimation, is 60 to 65%. The left ventricle has normal function. The left ventricle has no regional wall motion abnormalities. There is moderate concentric left ventricular hypertrophy. Left ventricular diastolic parameters are indeterminate. Elevated left ventricular end-diastolic pressure.  2. Right ventricular systolic function is normal. The right ventricular size is normal. Tricuspid regurgitation signal is inadequate for assessing PA pressure.  3. The mitral valve is normal in structure. Trivial mitral valve regurgitation. No evidence of mitral stenosis.  4. The aortic valve is calcified. Aortic valve regurgitation is  mild. Mild to moderate aortic valve sclerosis/calcification is present, without any evidence of

## 2021-02-17 NOTE — Progress Notes (Signed)
Occupational Therapy Treatment Patient Details Name: Amber Harris MRN: [id number] DOB: 1961-06-05 Today's Date: 02/17/2021    History of present illness Patient is a 60 y/o female who presents on 02/16/32 with chest pain and bradycardia; code stroke called 3/20 due to visual changes and decreased arousal. NIH:4 Head CT and MRI-unremarkable. Neuro workup pending. Plan for cardiac cath pending stroke workup. PMH includes mild aortic stenosis as of 2019, tobacco use, previous CVA, HTN, DM2, chronic bradycardia, and reported history of previous MI.   OT comments  Pt progressing toward established OT goals. Pt currently requires supervision for toileting, grooming, and LB dressing with use of AE. Pt requires supervision for functional mobility at RW level. Pt demonstrates limitations activity tolerance, endurance, and cognition. She failed the Pillbox test indicating limitations with executive functioning (see Cognition section below for further details). Pt will continue to benefit from skilled OT services to maximize safety and independence with ADL/IADL and functional mobility. Will continue to follow acutely and progress as tolerated.    Follow Up Recommendations  Home health OT;Supervision - Intermittent    Equipment Recommendations  Hospital bed    Recommendations for Other Services      Precautions / Restrictions Precautions Precautions: Fall;Other (comment) Precaution Comments: reports hx of falls, bradycardia Restrictions Weight Bearing Restrictions: No       Mobility Bed Mobility Overal bed mobility: Modified Independent                  Transfers Overall transfer level: Needs assistance Equipment used: Rolling walker (2 wheeled) Transfers: Sit to/from Stand Sit to Stand: Supervision         General transfer comment: supervision for safety    Balance Overall balance assessment: History of Falls;Needs assistance Sitting-balance support: Feet supported;No  upper extremity supported Sitting balance-Leahy Scale: Good     Standing balance support: During functional activity Standing balance-Leahy Scale: Poor Standing balance comment: Requires UE support in standing.                           ADL either performed or assessed with clinical judgement   ADL Overall ADL's : Needs assistance/impaired     Grooming: Supervision/safety;Standing               Lower Body Dressing: Min guard;Sit to/from stand Lower Body Dressing Details (indicate cue type and reason): pt able to return demonstrate use of reacher and sock aide to assist with LB dressing Toilet Transfer: Supervision/safety;Ambulation Toilet Transfer Details (indicate cue type and reason): pt voided using standard commode Toileting- Clothing Manipulation and Hygiene: Supervision/safety;Sit to/from stand       Functional mobility during ADLs: Supervision/safety;Rolling walker General ADL Comments: supervision for safety, HR 40s at rest, up to 58bpm with exertion     Vision       Perception     Praxis      Cognition Arousal/Alertness: Awake/Harris Behavior During Therapy: WFL for tasks assessed/performed Overall Cognitive Status: Within Functional Limits for tasks assessed                                 General Comments: pt completed the Pillbox test and failed. The Pillbox test is an occupation based assessment requiring the test taker to fill the pillbox according to prescriptions of five pill bottles with commonly prescribed "medications". Pt's score indicates limitations with executive functioning skills required for success with  medication management. Pt would benefit from [facility] to assist with medication management        Exercises     Shoulder Instructions       General Comments bradycardic at rest    Pertinent Vitals/ Pain       Pain Assessment: No/denies pain Faces Pain Scale: No hurt  Home Living                                           Prior Functioning/Environment              Frequency  Min 2X/week        Progress Toward Goals  OT Goals(current goals can now be found in the care plan section)  Progress towards OT goals: Progressing toward goals  Acute Rehab OT Goals Patient Stated Goal: to get better and go home OT Goal Formulation: With patient Time For Goal Achievement: 03/02/21 Potential to Achieve Goals: Good ADL Goals Pt Will Perform Grooming: with supervision;standing Pt Will Perform Lower Body Dressing: with supervision;sit to/from stand;with adaptive equipment Pt Will Transfer to Toilet: with modified independence;ambulating Additional ADL Goal #1: Pt will verbalize 3 fall prevention strategies for safe engagement of ADL/IADL and functional mobility.  Plan Discharge plan remains appropriate    Co-evaluation                 AM-PAC OT "6 Clicks" Daily Activity     Outcome Measure   Help from another person eating meals?: None Help from another person taking care of personal grooming?: A Little Help from another person toileting, which includes using toliet, bedpan, or urinal?: A Little Help from another person bathing (including washing, rinsing, drying)?: A Little Help from another person to put on and taking off regular upper body clothing?: A Little Help from another person to put on and taking off regular lower body clothing?: A Little 6 Click Score: 19    End of Session Equipment Utilized During Treatment: Rolling walker  OT Visit Diagnosis: Unsteadiness on feet (R26.81);Other abnormalities of gait and mobility (R26.89);Muscle weakness (generalized) (M62.81);History of falling (Z91.81);Other symptoms and signs involving cognitive function   Activity Tolerance Patient tolerated treatment well   Patient Left in chair;with call bell/phone within reach   Nurse Communication Mobility status        Time: 0930-1004 OT Time Calculation (min):  34 min  Charges: OT General Charges $OT Visit: 1 Visit OT Treatments $Self Care/Home Management : 23-37 mins  Amber Harris OTR/L Acute Rehabilitation Services Office: [phone]    Amber Harris 02/17/2021, 11:30 AM

## 2021-02-17 NOTE — Progress Notes (Signed)
Physical Therapy Treatment Patient Details Name: Amber Harris MRN: [id number] DOB: 03-Nov-1961 Today's Date: 02/17/2021    History of Present Illness Patient is a 60 y/o female who presents on 02/16/32 with chest pain and bradycardia; code stroke called 3/20 due to visual changes and decreased arousal. NIH:4 Head CT and MRI-unremarkable. Neuro workup pending. Plan for cardiac cath 02/17/21. PMH includes mild aortic stenosis as of 2019, tobacco use, previous CVA, HTN, DM2, chronic bradycardia, and reported history of previous MI.    PT Comments    Patient progressing well towards PT goals. Improved ambulation distance with Min guard assist and use of RW for support. Reports water like sensation coming off of left foot with walking that has been going on for a few weeks now. Inquiring about this- deferred to MD. Able to perform ADL task donning underwear in sitting/standing without difficulty. VSS on RA throughout activity with no dizziness. Pt has to negotiate 10 steps to enter home and reports falling down these twice. Will plan for stair training next session as tolerated.     Follow Up Recommendations  Home health PT;Supervision - Intermittent     Equipment Recommendations  None recommended by PT    Recommendations for Other Services       Precautions / Restrictions Precautions Precautions: Fall;Other (comment) Precaution Comments: reports hx of falls, bradycardia Restrictions Weight Bearing Restrictions: No    Mobility  Bed Mobility Overal bed mobility: Modified Independent             General bed mobility comments: Up in chair upon PT arrival.    Transfers Overall transfer level: Needs assistance Equipment used: Rolling walker (2 wheeled) Transfers: Sit to/from Stand Sit to Stand: Supervision         General transfer comment: supervision for safety; stood from chair x1.  Ambulation/Gait Ambulation/Gait assistance: Min guard Gait Distance (Feet): 120  Feet Assistive device: Rolling walker (2 wheeled) Gait Pattern/deviations: Step-through pattern;Decreased stride length;Trunk flexed;Decreased stance time - left Gait velocity: decreased   General Gait Details: Slow, mostly steady gait with RW for support. Reports sensation of water dripping off her left foot when walking (not new). No knee buckling. HR bradycardic in 50s, no dizziness.   Stairs             Wheelchair Mobility    Modified Rankin (Stroke Patients Only)       Balance Overall balance assessment: History of Falls;Needs assistance Sitting-balance support: Feet supported;No upper extremity supported Sitting balance-Leahy Scale: Good Sitting balance - Comments: Able to reach down and donn underwear sitting in chair without LOB.   Standing balance support: During functional activity Standing balance-Leahy Scale: Fair Standing balance comment: Able to pull up underwear in standing without UE support for a few seconds; needs UE support for walking.                            Cognition Arousal/Alertness: Awake/alert Behavior During Therapy: WFL for tasks assessed/performed Overall Cognitive Status: Within Functional Limits for tasks assessed                                 General Comments: for basic mobility tasks.      Exercises      General Comments General comments (skin integrity, edema, etc.): Son present at end of session; bradycardia still present, HR in 50s.      Pertinent  Vitals/Pain Pain Assessment: No/denies pain Faces Pain Scale: No hurt    Home Living                      Prior Function            PT Goals (current goals can now be found in the care plan section) Acute Rehab PT Goals Patient Stated Goal: to get better and go home Progress towards PT goals: Progressing toward goals    Frequency    Min 3X/week      PT Plan Discharge plan needs to be updated    Co-evaluation               AM-PAC PT "6 Clicks" Mobility   Outcome Measure  Help needed turning from your back to your side while in a flat bed without using bedrails?: None Help needed moving from lying on your back to sitting on the side of a flat bed without using bedrails?: A Little Help needed moving to and from a bed to a chair (including a wheelchair)?: None Help needed standing up from a chair using your arms (e.g., wheelchair or bedside chair)?: A Little Help needed to walk in hospital room?: A Little Help needed climbing 3-5 steps with a railing? : A Little 6 Click Score: 20    End of Session Equipment Utilized During Treatment: Gait belt Activity Tolerance: Patient tolerated treatment well Patient left: in chair;with call bell/phone within reach;with family/visitor present Nurse Communication: Mobility status PT Visit Diagnosis: Difficulty in walking, not elsewhere classified (R26.2);Muscle weakness (generalized) (M62.81)     Time: 1115-1130 PT Time Calculation (min) (ACUTE ONLY): 15 min  Charges:  $Gait Training: 8-22 mins                     Vale Haven, PT, DPT Acute Rehabilitation Services Pager [phone] Office [phone]       Blake Divine A Lanier Ensign 02/17/2021, 12:56 PM

## 2021-02-17 NOTE — Plan of Care (Signed)

## 2021-02-17 NOTE — Progress Notes (Signed)
Progress Note  Patient Name: Amber Harris Date of Encounter: 02/17/2021  Primary Cardiologist: Dr. Dina Rich, MD   Subjective   No specific complaints today. Plan for cath today   Inpatient Medications    Scheduled Meds: . [START ON 02/18/2021] aspirin EC  81 mg Oral Daily  . benazepril  20 mg Oral Daily  . chlorthalidone  50 mg Oral Daily  . cloNIDine  0.2 mg Oral TID  . empagliflozin  25 mg Oral Daily  . estradiol  1 mg Oral Daily  . fluticasone furoate-vilanterol  1 puff Inhalation Daily  . furosemide  40 mg Oral Daily  . insulin aspart  0-15 Units Subcutaneous TID WC  . insulin aspart  0-5 Units Subcutaneous QHS  . insulin aspart  5 Units Subcutaneous TID WC  . insulin glargine  20 Units Subcutaneous QHS  . linagliptin  5 mg Oral Daily  . multivitamin with minerals  1 tablet Oral Daily  . risperiDONE  0.5 mg Oral QHS  . rosuvastatin  40 mg Oral Daily  . sodium chloride flush  3 mL Intravenous Q12H  . umeclidinium bromide  1 puff Inhalation Daily   Continuous Infusions: . sodium chloride    . sodium chloride 1 mL/kg/hr (02/16/21 0650)  . heparin 1,250 Units/hr (02/17/21 0717)  . nitroGLYCERIN 5 mcg/min (02/16/21 0000)   PRN Meds: sodium chloride, acetaminophen, albuterol, albuterol, nitroGLYCERIN, ondansetron (ZOFRAN) IV, sodium chloride flush, topiramate   Vital Signs    Vitals:   02/16/21 2001 02/17/21 0513 02/17/21 0828 02/17/21 0833  BP: 122/65 (!) 120/55 (!) 119/54 (!) 119/54  Pulse: 84  63   Resp: 16 17 18    Temp: 98.4 F (36.9 C) 97.7 F (36.5 C) 97.8 F (36.6 C)   TempSrc: Oral Oral Oral   SpO2: 97% 98% 96%   Weight:  112.7 kg    Height:        Intake/Output Summary (Last 24 hours) at 02/17/2021 0841 Last data filed at 02/16/2021 2000 Gross per 24 hour  Intake 1281.2 ml  Output --  Net 1281.2 ml   Filed Weights   02/15/21 0436 02/17/21 0513  Weight: 112.8 kg 112.7 kg    Physical Exam   General: Well developed, well  nourished, NAD Neck: Negative for carotid bruits. No JVD Lungs:Clear to ausculation bilaterally. No wheezes, rales, or rhonchi. Breathing is unlabored. Cardiovascular: RRR with S1 S2. No murmurs Extremities: No edema. Radial pulses 2+ bilaterally Neuro: Alert and oriented. No focal deficits. No facial asymmetry. MAE spontaneously. Psych: Responds to questions appropriately with normal affect.    Labs    Chemistry Recent Labs  Lab 02/14/21 2044 02/15/21 0214 02/16/21 0419  NA 139 139 139  K 3.4* 3.7 3.4*  CL 105 106 104  CO2 27 24 28   GLUCOSE 84 165* 126*  BUN 6 10 12   CREATININE 0.94 0.93 1.09*  CALCIUM 9.1 8.5* 9.1  PROT 7.5  --   --   ALBUMIN 3.5  --   --   AST 28  --   --   ALT 26  --   --   ALKPHOS 81  --   --   BILITOT 1.5*  --   --   GFRNONAA >60 >60 59*  ANIONGAP 7 9 7      Hematology Recent Labs  Lab 02/15/21 0214 02/16/21 0419 02/17/21 0257  WBC 4.9 5.4 6.6  RBC 3.80* 3.89 4.00  HGB 12.1 12.2 12.5  HCT 35.7*  36.4 36.9  MCV 93.9 93.6 92.3  MCH 31.8 31.4 31.3  MCHC 33.9 33.5 33.9  RDW 12.4 12.3 12.2  PLT 136* 140* 146*    Cardiac EnzymesNo results for input(s): TROPONINI in the last 168 hours. No results for input(s): TROPIPOC in the last 168 hours.   BNP Recent Labs  Lab 02/14/21 2044  BNP 70.1     DDimer No results for input(s): DDIMER in the last 168 hours.   Radiology    MR BRAIN WO CONTRAST  Result Date: 02/16/2021 CLINICAL DATA:  60 year old female code stroke presentation with unrevealing CTA, CTP. EXAM: MRI HEAD WITHOUT CONTRAST TECHNIQUE: Multiplanar, multiecho pulse sequences of the brain and surrounding structures were obtained without intravenous contrast. COMPARISON:  CTA, CT head and CTP yesterday. [facility] Brain MRI 05/27/2020, 10/27/2014. FINDINGS: Brain: No restricted diffusion or evidence of acute infarction. Chronic prominent lacunar infarct of the right pons again noted. Small area of focal encephalomalacia in  the body of the corpus callosum is stable. No midline shift, mass effect, evidence of mass lesion, ventriculomegaly, extra-axial collection or acute intracranial hemorrhage. Cervicomedullary junction within normal limits. Chronic partially empty sella. Mild T2 hyperintensity in the left basal ganglia has progressed since last year. Otherwise stable gray and white matter signal throughout the brain. No cortical encephalomalacia or chronic cerebral blood products identified. Vascular: Major intracranial vascular flow voids are stable since last year. Skull and upper cervical spine: Bone marrow signal is stable since 2015. Partially visible cervical spine disc degeneration. Sinuses/Orbits: Postoperative changes to both globes since last year. Orbits otherwise appears stable, negative. Paranasal sinuses and mastoids are stable and well pneumatized. Other: Grossly normal visible internal auditory structures. Visible scalp and face appear negative. IMPRESSION: 1. No acute intracranial abnormality. 2. Chronic small vessel disease with minimal progression from an MRI last year. Electronically Signed   By: Odessa Fleming M.D.   On: 02/16/2021 08:08   CT CEREBRAL PERFUSION W CONTRAST  Result Date: 02/15/2021 CLINICAL DATA:  Visual disturbance. Acute stroke suspected. Negative head CT. EXAM: CT ANGIOGRAPHY HEAD AND NECK CT PERFUSION BRAIN TECHNIQUE: Multidetector CT imaging of the head and neck was performed using the standard protocol during bolus administration of intravenous contrast. Multiplanar CT image reconstructions and MIPs were obtained to evaluate the vascular anatomy. Carotid stenosis measurements (when applicable) are obtained utilizing NASCET criteria, using the distal internal carotid diameter as the denominator. Multiphase CT imaging of the brain was performed following IV bolus contrast injection. Subsequent parametric perfusion maps were calculated using RAPID software. CONTRAST:  OMNIPAQUE IOHEXOL 350  MG/ML SOLN COMPARISON:  Head CT earlier same day. FINDINGS: CTA NECK FINDINGS Aortic arch: Aortic atherosclerotic calcification. Branching pattern is normal without origin stenosis. Right carotid system: Common carotid artery widely patent to the bifurcation. No evidence of carotid bifurcation soft or calcified plaque. No stenosis. Some motion degradation. Left carotid system: Common carotid artery widely patent to the bifurcation. No evidence of bifurcation soft or calcified plaque. No stenosis. Some motion degradation. Vertebral arteries: Both vertebral artery origins are widely patent. Both vertebral arteries are patent through the cervical region. Motion degradation. Skeleton: Ordinary cervical spondylosis. Other neck: No mass or adenopathy.  Negative Upper chest: Negative Review of the MIP images confirms the above findings CTA HEAD FINDINGS Anterior circulation: Both internal carotid arteries are patent through the skull base and siphon regions. There is siphon atherosclerotic calcification with stenosis estimated at 50% on the right and 30% on the left. The anterior and  middle cerebral vessels are patent. No large or medium vessel occlusion. No aneurysm or vascular malformation. Posterior circulation: Both vertebral arteries are patent to the basilar. No basilar stenosis. Superior cerebellar and posterior cerebral arteries show flow. Venous sinuses: Patent and normal. Anatomic variants: None significant. Review of the MIP images confirms the above findings CT Brain Perfusion Findings: ASPECTS: 10 CBF (<30%) Volume: 0mL Perfusion (Tmax>6.0s) volume: 0mL Mismatch Volume: 0mL Infarction Location:None IMPRESSION: 1. Some motion degradation. No large or medium vessel occlusion. Negative perfusion study. 2. Aortic atherosclerosis. 3. No carotid bifurcation stenosis. 50% stenosis of the right carotid siphon. 30% stenosis of the left carotid siphon. Aortic Atherosclerosis (ICD10-I70.0). Electronically Signed   By:  Paulina Fusi M.D.   On: 02/15/2021 17:54   ECHOCARDIOGRAM COMPLETE  Result Date: 02/15/2021    ECHOCARDIOGRAM REPORT   Patient Name:   Amber Harris Date of Exam: 02/15/2021 Medical Rec #:  [id number]        Height:       69.0 in Accession #:    [id number]       Weight:       248.6 lb Date of Birth:  01-13-1961        BSA:          2.266 m Patient Age:    59 years         BP:           128/69 mmHg Patient Gender: F                HR:           48 bpm. Exam Location:  Inpatient Procedure: 2D Echo, Cardiac Doppler, Color Doppler and Intracardiac            Opacification Agent Indications:    Chest pain  History:        Patient has prior history of Echocardiogram examinations, most                 recent 05/22/2018. CAD, COPD; Risk Factors:Diabetes and                 Hypertension. H/O stroke.  Sonographer:    Ross Ludwig RDCS (AE) Referring Phys: [id number] [facility] CARNICELLI  Sonographer Comments: Patient is morbidly obese. Image acquisition challenging due to patient body habitus and Image acquisition challenging due to COPD. IMPRESSIONS  1. Left ventricular ejection fraction, by estimation, is 60 to 65%. The left ventricle has normal function. The left ventricle has no regional wall motion abnormalities. There is moderate concentric left ventricular hypertrophy. Left ventricular diastolic parameters are indeterminate. Elevated left ventricular end-diastolic pressure.  2. Right ventricular systolic function is normal. The right ventricular size is normal. Tricuspid regurgitation signal is inadequate for assessing PA pressure.  3. The mitral valve is normal in structure. Trivial mitral valve regurgitation. No evidence of mitral stenosis.  4. The aortic valve is calcified. Aortic valve regurgitation is mild. Mild to moderate aortic valve sclerosis/calcification is present, without any evidence of aortic stenosis. Focal dense calcification of the left coornary cusp is present. Aortic regurgitation PHT measures  543 msec.  5. The inferior vena cava is dilated in size with <50% respiratory variability, suggesting right atrial pressure of 15 mmHg. FINDINGS  Left Ventricle: Left ventricular ejection fraction, by estimation, is 60 to 65%. The left ventricle has normal function. The left ventricle has no regional wall motion abnormalities. Definity contrast agent was given IV to delineate the left ventricular  endocardial  borders. The left ventricular internal cavity size was normal in size. There is moderate concentric left ventricular hypertrophy. Left ventricular diastolic parameters are indeterminate. Elevated left ventricular end-diastolic pressure. Right Ventricle: The right ventricular size is normal. No increase in right ventricular wall thickness. Right ventricular systolic function is normal. Tricuspid regurgitation signal is inadequate for assessing PA pressure. Left Atrium: Left atrial size was normal in size. Right Atrium: Right atrial size was normal in size. Pericardium: There is no evidence of pericardial effusion. Mitral Valve: The mitral valve is normal in structure. Trivial mitral valve regurgitation. No evidence of mitral valve stenosis. Tricuspid Valve: The tricuspid valve is normal in structure. Tricuspid valve regurgitation is trivial. No evidence of tricuspid stenosis. Aortic Valve: The aortic valve is calcified. Aortic valve regurgitation is mild. Aortic regurgitation PHT measures 543 msec. Mild to moderate aortic valve sclerosis/calcification is present, without any evidence of aortic stenosis. Pulmonic Valve: The pulmonic valve was normal in structure. Pulmonic valve regurgitation is not visualized. No evidence of pulmonic stenosis. Aorta: The aortic root is normal in size and structure. Venous: The inferior vena cava is dilated in size with less than 50% respiratory variability, suggesting right atrial pressure of 15 mmHg. IAS/Shunts: No atrial level shunt detected by color flow Doppler.  LEFT  VENTRICLE PLAX 2D LVIDd:         4.60 cm  Diastology LVIDs:         2.70 cm  LV e' medial:    6.85 cm/s LV PW:         1.50 cm  LV E/e' medial:  13.1 LV IVS:        1.40 cm  LV e' lateral:   5.66 cm/s LVOT diam:     2.10 cm  LV E/e' lateral: 15.8 LV SV:         80 LV SV Index:   35 LVOT Area:     3.46 cm  RIGHT VENTRICLE             IVC RV Basal diam:  3.50 cm     IVC diam: 2.40 cm RV S prime:     10.70 cm/s TAPSE (M-mode): 2.3 cm LEFT ATRIUM             Index       RIGHT ATRIUM           Index LA diam:        3.30 cm 1.46 cm/m  RA Area:     16.60 cm LA Vol (A2C):   50.0 ml 22.06 ml/m RA Volume:   39.10 ml  17.25 ml/m LA Vol (A4C):   54.0 ml 23.83 ml/m LA Biplane Vol: 52.4 ml 23.12 ml/m  AORTIC VALVE LVOT Vmax:   99.90 cm/s LVOT Vmean:  60.400 cm/s LVOT VTI:    0.231 m AI PHT:      543 msec  AORTA Ao Root diam: 3.30 cm Ao Asc diam:  3.40 cm MITRAL VALVE MV Area (PHT): 2.69 cm    SHUNTS MV Decel Time: 282 msec    Systemic VTI:  0.23 m MV E velocity: 89.50 cm/s  Systemic Diam: 2.10 cm MV A velocity: 54.00 cm/s MV E/A ratio:  1.66 Armanda Magic MD Electronically signed by Armanda Magic MD Signature Date/Time: 02/15/2021/2:47:53 PM    Final    CT HEAD CODE STROKE WO CONTRAST  Result Date: 02/15/2021 CLINICAL DATA:  Code stroke.  Visual disturbance.  Stroke suspected. EXAM: CT HEAD WITHOUT CONTRAST TECHNIQUE: Contiguous axial  images were obtained from the base of the skull through the vertex without intravenous contrast. COMPARISON:  MRI 05/27/2020 FINDINGS: Brain: Normal appearance by CT without evidence of atrophy, old or acute infarction, mass lesion, hemorrhage, hydrocephalus or extra-axial collection. Vascular: There is atherosclerotic calcification of the major vessels at the base of the brain. Skull: Negative Sinuses/Orbits: Clear/normal Other: None ASPECTS (Alberta Stroke Program Early CT Score) - Ganglionic level infarction (caudate, lentiform nuclei, internal capsule, insula, M1-M3 cortex): 7 -  Supraganglionic infarction (M4-M6 cortex): 3 Total score (0-10 with 10 being normal): 10 IMPRESSION: 1. Normal head CT for age. Atherosclerotic calcification of the major vessels at the base of the brain. 2. ASPECTS is 10. 3. These results were communicated to Dr. Selina Cooley At 5:00 pmon 3/20/2022by text page via the [facility] messaging system. Electronically Signed   By: Paulina Fusi M.D.   On: 02/15/2021 17:01   CT ANGIO HEAD CODE STROKE  Result Date: 02/15/2021 CLINICAL DATA:  Visual disturbance. Acute stroke suspected. Negative head CT. EXAM: CT ANGIOGRAPHY HEAD AND NECK CT PERFUSION BRAIN TECHNIQUE: Multidetector CT imaging of the head and neck was performed using the standard protocol during bolus administration of intravenous contrast. Multiplanar CT image reconstructions and MIPs were obtained to evaluate the vascular anatomy. Carotid stenosis measurements (when applicable) are obtained utilizing NASCET criteria, using the distal internal carotid diameter as the denominator. Multiphase CT imaging of the brain was performed following IV bolus contrast injection. Subsequent parametric perfusion maps were calculated using RAPID software. CONTRAST:  OMNIPAQUE IOHEXOL 350 MG/ML SOLN COMPARISON:  Head CT earlier same day. FINDINGS: CTA NECK FINDINGS Aortic arch: Aortic atherosclerotic calcification. Branching pattern is normal without origin stenosis. Right carotid system: Common carotid artery widely patent to the bifurcation. No evidence of carotid bifurcation soft or calcified plaque. No stenosis. Some motion degradation. Left carotid system: Common carotid artery widely patent to the bifurcation. No evidence of bifurcation soft or calcified plaque. No stenosis. Some motion degradation. Vertebral arteries: Both vertebral artery origins are widely patent. Both vertebral arteries are patent through the cervical region. Motion degradation. Skeleton: Ordinary cervical spondylosis. Other neck: No mass or  adenopathy.  Negative Upper chest: Negative Review of the MIP images confirms the above findings CTA HEAD FINDINGS Anterior circulation: Both internal carotid arteries are patent through the skull base and siphon regions. There is siphon atherosclerotic calcification with stenosis estimated at 50% on the right and 30% on the left. The anterior and middle cerebral vessels are patent. No large or medium vessel occlusion. No aneurysm or vascular malformation. Posterior circulation: Both vertebral arteries are patent to the basilar. No basilar stenosis. Superior cerebellar and posterior cerebral arteries show flow. Venous sinuses: Patent and normal. Anatomic variants: None significant. Review of the MIP images confirms the above findings CT Brain Perfusion Findings: ASPECTS: 10 CBF (<30%) Volume: 0mL Perfusion (Tmax>6.0s) volume: 0mL Mismatch Volume: 0mL Infarction Location:None IMPRESSION: 1. Some motion degradation. No large or medium vessel occlusion. Negative perfusion study. 2. Aortic atherosclerosis. 3. No carotid bifurcation stenosis. 50% stenosis of the right carotid siphon. 30% stenosis of the left carotid siphon. Aortic Atherosclerosis (ICD10-I70.0). Electronically Signed   By: Paulina Fusi M.D.   On: 02/15/2021 17:54   CT ANGIO NECK CODE STROKE  Result Date: 02/15/2021 CLINICAL DATA:  Visual disturbance. Acute stroke suspected. Negative head CT. EXAM: CT ANGIOGRAPHY HEAD AND NECK CT PERFUSION BRAIN TECHNIQUE: Multidetector CT imaging of the head and neck was performed using the standard protocol during bolus administration of intravenous  contrast. Multiplanar CT image reconstructions and MIPs were obtained to evaluate the vascular anatomy. Carotid stenosis measurements (when applicable) are obtained utilizing NASCET criteria, using the distal internal carotid diameter as the denominator. Multiphase CT imaging of the brain was performed following IV bolus contrast injection. Subsequent parametric perfusion  maps were calculated using RAPID software. CONTRAST:  OMNIPAQUE IOHEXOL 350 MG/ML SOLN COMPARISON:  Head CT earlier same day. FINDINGS: CTA NECK FINDINGS Aortic arch: Aortic atherosclerotic calcification. Branching pattern is normal without origin stenosis. Right carotid system: Common carotid artery widely patent to the bifurcation. No evidence of carotid bifurcation soft or calcified plaque. No stenosis. Some motion degradation. Left carotid system: Common carotid artery widely patent to the bifurcation. No evidence of bifurcation soft or calcified plaque. No stenosis. Some motion degradation. Vertebral arteries: Both vertebral artery origins are widely patent. Both vertebral arteries are patent through the cervical region. Motion degradation. Skeleton: Ordinary cervical spondylosis. Other neck: No mass or adenopathy.  Negative Upper chest: Negative Review of the MIP images confirms the above findings CTA HEAD FINDINGS Anterior circulation: Both internal carotid arteries are patent through the skull base and siphon regions. There is siphon atherosclerotic calcification with stenosis estimated at 50% on the right and 30% on the left. The anterior and middle cerebral vessels are patent. No large or medium vessel occlusion. No aneurysm or vascular malformation. Posterior circulation: Both vertebral arteries are patent to the basilar. No basilar stenosis. Superior cerebellar and posterior cerebral arteries show flow. Venous sinuses: Patent and normal. Anatomic variants: None significant. Review of the MIP images confirms the above findings CT Brain Perfusion Findings: ASPECTS: 10 CBF (<30%) Volume: 0mL Perfusion (Tmax>6.0s) volume: 0mL Mismatch Volume: 0mL Infarction Location:None IMPRESSION: 1. Some motion degradation. No large or medium vessel occlusion. Negative perfusion study. 2. Aortic atherosclerosis. 3. No carotid bifurcation stenosis. 50% stenosis of the right carotid siphon. 30% stenosis of the left  carotid siphon. Aortic Atherosclerosis (ICD10-I70.0). Electronically Signed   By: Paulina Fusi M.D.   On: 02/15/2021 17:54   Telemetry    02/17/21 SB with rates in the 50's  - Personally Reviewed  ECG    No new tracing as of 02/17/21 - Personally Reviewed  Cardiac Studies    Echocardiogram 02/15/2021:  1. Left ventricular ejection fraction, by estimation, is 60 to 65%. The  left ventricle has normal function. The left ventricle has no regional  wall motion abnormalities. There is moderate concentric left ventricular  hypertrophy. Left ventricular diastolic parameters are indeterminate. Elevated left ventricular end-diastolic pressure.  2. Right ventricular systolic function is normal. The right ventricular  size is normal. Tricuspid regurgitation signal is inadequate for assessing  PA pressure.  3. The mitral valve is normal in structure. Trivial mitral valve  regurgitation. No evidence of mitral stenosis.  4. The aortic valve is calcified. Aortic valve regurgitation is mild.  Mild to moderate aortic valve sclerosis/calcification is present, without  any evidence of aortic stenosis. Focal dense calcification of the left  coornary cusp is present. Aortic regurgitation PHT measures 543 msec. Mild to moderate aortic valve sclerosis/calcification is present, without any evidence of aortic stenosis.  5. The inferior vena cava is dilated in size with <50% respiratory  variability, suggesting right atrial pressure of 15 mmHg.   Patient Profile     60 y.o. female with a history of mild aortic stenosis as of 2019, tobacco use, previous stroke, hypertension, type 2 diabetes mellitus, chronic bradycardia, and reported history of previous myocardial infarction now presenting  with chest discomfort  Assessment & Plan    1. Unstable angina: -Plan was for [facility] yesterday however had stroke like symptoms 02/15/21 and underwent brain MRI and CT which were negative for acute changes. Symptoms felt  to be consistent with TIA versus occular migraine>>>felt ok to continue Heparin infusion and presume ok to proceed with LHC.  -Continue ASA, benazepril, statin  2. Chronic sinus bradycardia: -HR's maintaining in the 40-50's  -Verapamil held on admission -Continue to avoid AV blocking agents    3. Aortic stenosis: -Echocardiogram with mild to mod sclerosis without stenosis as above  4. Prior hx of CVA/TIA: -Developed left visual changes on Sunday morning with blurry vision then complete blindness on the left with left sided neglect. CTA head and neck and CT perfusion study were performed which were found to be normal. Symptoms felt to be consistent with TIA   5. DM2: -Hb A1c, 6.3 -SSI for glucose control while inpatient status  -Needs close follow up with PCP in the OP setting -On Trajenta,    6. Hypokalemia: -K+ 3.4 today>>>replace today   7. Chronic SB: -Chronic HR in the 50-60 range>>admission HRs in the 30-40's  -Previously on verapamil>>held on admission    Signed, Georgie Chard NP-C [facility] Pager: [phone] 02/17/2021, 8:41 AM     For questions or updates, please contact   Please consult [email or web address] for contact info under Cardiology/STEMI.

## 2021-02-17 NOTE — Progress Notes (Signed)
ANTICOAGULATION CONSULT NOTE - Follow Up Consult  Pharmacy Consult for heparin Indication: chest pain/ACS  Labs: Recent Labs    02/14/21 2044 02/14/21 2304 02/15/21 0214 02/15/21 0214 02/15/21 0845 02/15/21 1819 02/16/21 0419 02/17/21 0257  HGB  --   --  12.1   < >  --   --  12.2 12.5  HCT  --   --  35.7*  --   --   --  36.4 36.9  PLT  --   --  136*  --   --   --  140* 146*  APTT  --   --   --   --   --  61*  --   --   LABPROT  --   --   --   --   --  12.9  --   --   INR  --   --   --   --   --  1.0  --   --   HEPARINUNFRC  --   --  0.30   < > 0.41  --  0.56 0.57  CREATININE 0.94  --  0.93  --   --   --  1.09*  --   TROPONINIHS 7 8  --   --   --   --   --   --    < > = values in this interval not displayed.    Assessment: 20 YOF who presented with chest pain and bradycardia. Pharmacy consulted to start IV heparin for ACS. Noted with concern of stroke or TIA on 3/20  Heparin level therapeutic at 0.57 on1200 units/hr this morning. Hgb wnl, plt 140  Goal of Therapy:  -heparin level= 0.3-0.5   Plan:  -Decrease heparin to 1200 units/hr -Daily heparin level and CBC  Hildred Laser, PharmD Clinical Pharmacist **Pharmacist phone directory can now be found on [email or web address] ([email or web address]).  Listed under [place].

## 2021-02-17 NOTE — H&P (View-Only) (Signed)
Progress Note  Patient Name: Amber Harris Date of Encounter: 02/17/2021  Primary Cardiologist: Dr. Dina Rich, MD   Subjective   No specific complaints today. Plan for cath today   Inpatient Medications    Scheduled Meds: . [START ON 02/18/2021] aspirin EC  81 mg Oral Daily  . benazepril  20 mg Oral Daily  . chlorthalidone  50 mg Oral Daily  . cloNIDine  0.2 mg Oral TID  . empagliflozin  25 mg Oral Daily  . estradiol  1 mg Oral Daily  . fluticasone furoate-vilanterol  1 puff Inhalation Daily  . furosemide  40 mg Oral Daily  . insulin aspart  0-15 Units Subcutaneous TID WC  . insulin aspart  0-5 Units Subcutaneous QHS  . insulin aspart  5 Units Subcutaneous TID WC  . insulin glargine  20 Units Subcutaneous QHS  . linagliptin  5 mg Oral Daily  . multivitamin with minerals  1 tablet Oral Daily  . risperiDONE  0.5 mg Oral QHS  . rosuvastatin  40 mg Oral Daily  . sodium chloride flush  3 mL Intravenous Q12H  . umeclidinium bromide  1 puff Inhalation Daily   Continuous Infusions: . sodium chloride    . sodium chloride 1 mL/kg/hr (02/16/21 0650)  . heparin 1,250 Units/hr (02/17/21 0717)  . nitroGLYCERIN 5 mcg/min (02/16/21 0000)   PRN Meds: sodium chloride, acetaminophen, albuterol, albuterol, nitroGLYCERIN, ondansetron (ZOFRAN) IV, sodium chloride flush, topiramate   Vital Signs    Vitals:   02/16/21 2001 02/17/21 0513 02/17/21 0828 02/17/21 0833  BP: 122/65 (!) 120/55 (!) 119/54 (!) 119/54  Pulse: 84  63   Resp: 16 17 18    Temp: 98.4 F (36.9 C) 97.7 F (36.5 C) 97.8 F (36.6 C)   TempSrc: Oral Oral Oral   SpO2: 97% 98% 96%   Weight:  112.7 kg    Height:        Intake/Output Summary (Last 24 hours) at 02/17/2021 0841 Last data filed at 02/16/2021 2000 Gross per 24 hour  Intake 1281.2 ml  Output --  Net 1281.2 ml   Filed Weights   02/15/21 0436 02/17/21 0513  Weight: 112.8 kg 112.7 kg    Physical Exam   General: Well developed, well  nourished, NAD Neck: Negative for carotid bruits. No JVD Lungs:Clear to ausculation bilaterally. No wheezes, rales, or rhonchi. Breathing is unlabored. Cardiovascular: RRR with S1 S2. No murmurs Extremities: No edema. Radial pulses 2+ bilaterally Neuro: Alert and oriented. No focal deficits. No facial asymmetry. MAE spontaneously. Psych: Responds to questions appropriately with normal affect.    Labs    Chemistry Recent Labs  Lab 02/14/21 2044 02/15/21 0214 02/16/21 0419  NA 139 139 139  K 3.4* 3.7 3.4*  CL 105 106 104  CO2 27 24 28   GLUCOSE 84 165* 126*  BUN 6 10 12   CREATININE 0.94 0.93 1.09*  CALCIUM 9.1 8.5* 9.1  PROT 7.5  --   --   ALBUMIN 3.5  --   --   AST 28  --   --   ALT 26  --   --   ALKPHOS 81  --   --   BILITOT 1.5*  --   --   GFRNONAA >60 >60 59*  ANIONGAP 7 9 7      Hematology Recent Labs  Lab 02/15/21 0214 02/16/21 0419 02/17/21 0257  WBC 4.9 5.4 6.6  RBC 3.80* 3.89 4.00  HGB 12.1 12.2 12.5  HCT 35.7*  36.4 36.9  MCV 93.9 93.6 92.3  MCH 31.8 31.4 31.3  MCHC 33.9 33.5 33.9  RDW 12.4 12.3 12.2  PLT 136* 140* 146*    Cardiac EnzymesNo results for input(s): TROPONINI in the last 168 hours. No results for input(s): TROPIPOC in the last 168 hours.   BNP Recent Labs  Lab 02/14/21 2044  BNP 70.1     DDimer No results for input(s): DDIMER in the last 168 hours.   Radiology    MR BRAIN WO CONTRAST  Result Date: 02/16/2021 CLINICAL DATA:  60 year old female code stroke presentation with unrevealing CTA, CTP. EXAM: MRI HEAD WITHOUT CONTRAST TECHNIQUE: Multiplanar, multiecho pulse sequences of the brain and surrounding structures were obtained without intravenous contrast. COMPARISON:  CTA, CT head and CTP yesterday. [facility] Brain MRI 05/27/2020, 10/27/2014. FINDINGS: Brain: No restricted diffusion or evidence of acute infarction. Chronic prominent lacunar infarct of the right pons again noted. Small area of focal encephalomalacia in  the body of the corpus callosum is stable. No midline shift, mass effect, evidence of mass lesion, ventriculomegaly, extra-axial collection or acute intracranial hemorrhage. Cervicomedullary junction within normal limits. Chronic partially empty sella. Mild T2 hyperintensity in the left basal ganglia has progressed since last year. Otherwise stable gray and white matter signal throughout the brain. No cortical encephalomalacia or chronic cerebral blood products identified. Vascular: Major intracranial vascular flow voids are stable since last year. Skull and upper cervical spine: Bone marrow signal is stable since 2015. Partially visible cervical spine disc degeneration. Sinuses/Orbits: Postoperative changes to both globes since last year. Orbits otherwise appears stable, negative. Paranasal sinuses and mastoids are stable and well pneumatized. Other: Grossly normal visible internal auditory structures. Visible scalp and face appear negative. IMPRESSION: 1. No acute intracranial abnormality. 2. Chronic small vessel disease with minimal progression from an MRI last year. Electronically Signed   By: Odessa Fleming M.D.   On: 02/16/2021 08:08   CT CEREBRAL PERFUSION W CONTRAST  Result Date: 02/15/2021 CLINICAL DATA:  Visual disturbance. Acute stroke suspected. Negative head CT. EXAM: CT ANGIOGRAPHY HEAD AND NECK CT PERFUSION BRAIN TECHNIQUE: Multidetector CT imaging of the head and neck was performed using the standard protocol during bolus administration of intravenous contrast. Multiplanar CT image reconstructions and MIPs were obtained to evaluate the vascular anatomy. Carotid stenosis measurements (when applicable) are obtained utilizing NASCET criteria, using the distal internal carotid diameter as the denominator. Multiphase CT imaging of the brain was performed following IV bolus contrast injection. Subsequent parametric perfusion maps were calculated using RAPID software. CONTRAST:  OMNIPAQUE IOHEXOL 350  MG/ML SOLN COMPARISON:  Head CT earlier same day. FINDINGS: CTA NECK FINDINGS Aortic arch: Aortic atherosclerotic calcification. Branching pattern is normal without origin stenosis. Right carotid system: Common carotid artery widely patent to the bifurcation. No evidence of carotid bifurcation soft or calcified plaque. No stenosis. Some motion degradation. Left carotid system: Common carotid artery widely patent to the bifurcation. No evidence of bifurcation soft or calcified plaque. No stenosis. Some motion degradation. Vertebral arteries: Both vertebral artery origins are widely patent. Both vertebral arteries are patent through the cervical region. Motion degradation. Skeleton: Ordinary cervical spondylosis. Other neck: No mass or adenopathy.  Negative Upper chest: Negative Review of the MIP images confirms the above findings CTA HEAD FINDINGS Anterior circulation: Both internal carotid arteries are patent through the skull base and siphon regions. There is siphon atherosclerotic calcification with stenosis estimated at 50% on the right and 30% on the left. The anterior and  middle cerebral vessels are patent. No large or medium vessel occlusion. No aneurysm or vascular malformation. Posterior circulation: Both vertebral arteries are patent to the basilar. No basilar stenosis. Superior cerebellar and posterior cerebral arteries show flow. Venous sinuses: Patent and normal. Anatomic variants: None significant. Review of the MIP images confirms the above findings CT Brain Perfusion Findings: ASPECTS: 10 CBF (<30%) Volume: 0mL Perfusion (Tmax>6.0s) volume: 0mL Mismatch Volume: 0mL Infarction Location:None IMPRESSION: 1. Some motion degradation. No large or medium vessel occlusion. Negative perfusion study. 2. Aortic atherosclerosis. 3. No carotid bifurcation stenosis. 50% stenosis of the right carotid siphon. 30% stenosis of the left carotid siphon. Aortic Atherosclerosis (ICD10-I70.0). Electronically Signed   By:  Paulina Fusi M.D.   On: 02/15/2021 17:54   ECHOCARDIOGRAM COMPLETE  Result Date: 02/15/2021    ECHOCARDIOGRAM REPORT   Patient Name:   Amber Harris Date of Exam: 02/15/2021 Medical Rec #:  [id number]        Height:       69.0 in Accession #:    [id number]       Weight:       248.6 lb Date of Birth:  01-13-1961        BSA:          2.266 m Patient Age:    59 years         BP:           128/69 mmHg Patient Gender: F                HR:           48 bpm. Exam Location:  Inpatient Procedure: 2D Echo, Cardiac Doppler, Color Doppler and Intracardiac            Opacification Agent Indications:    Chest pain  History:        Patient has prior history of Echocardiogram examinations, most                 recent 05/22/2018. CAD, COPD; Risk Factors:Diabetes and                 Hypertension. H/O stroke.  Sonographer:    Ross Ludwig RDCS (AE) Referring Phys: [id number] [facility] CARNICELLI  Sonographer Comments: Patient is morbidly obese. Image acquisition challenging due to patient body habitus and Image acquisition challenging due to COPD. IMPRESSIONS  1. Left ventricular ejection fraction, by estimation, is 60 to 65%. The left ventricle has normal function. The left ventricle has no regional wall motion abnormalities. There is moderate concentric left ventricular hypertrophy. Left ventricular diastolic parameters are indeterminate. Elevated left ventricular end-diastolic pressure.  2. Right ventricular systolic function is normal. The right ventricular size is normal. Tricuspid regurgitation signal is inadequate for assessing PA pressure.  3. The mitral valve is normal in structure. Trivial mitral valve regurgitation. No evidence of mitral stenosis.  4. The aortic valve is calcified. Aortic valve regurgitation is mild. Mild to moderate aortic valve sclerosis/calcification is present, without any evidence of aortic stenosis. Focal dense calcification of the left coornary cusp is present. Aortic regurgitation PHT measures  543 msec.  5. The inferior vena cava is dilated in size with <50% respiratory variability, suggesting right atrial pressure of 15 mmHg. FINDINGS  Left Ventricle: Left ventricular ejection fraction, by estimation, is 60 to 65%. The left ventricle has normal function. The left ventricle has no regional wall motion abnormalities. Definity contrast agent was given IV to delineate the left ventricular  endocardial  borders. The left ventricular internal cavity size was normal in size. There is moderate concentric left ventricular hypertrophy. Left ventricular diastolic parameters are indeterminate. Elevated left ventricular end-diastolic pressure. Right Ventricle: The right ventricular size is normal. No increase in right ventricular wall thickness. Right ventricular systolic function is normal. Tricuspid regurgitation signal is inadequate for assessing PA pressure. Left Atrium: Left atrial size was normal in size. Right Atrium: Right atrial size was normal in size. Pericardium: There is no evidence of pericardial effusion. Mitral Valve: The mitral valve is normal in structure. Trivial mitral valve regurgitation. No evidence of mitral valve stenosis. Tricuspid Valve: The tricuspid valve is normal in structure. Tricuspid valve regurgitation is trivial. No evidence of tricuspid stenosis. Aortic Valve: The aortic valve is calcified. Aortic valve regurgitation is mild. Aortic regurgitation PHT measures 543 msec. Mild to moderate aortic valve sclerosis/calcification is present, without any evidence of aortic stenosis. Pulmonic Valve: The pulmonic valve was normal in structure. Pulmonic valve regurgitation is not visualized. No evidence of pulmonic stenosis. Aorta: The aortic root is normal in size and structure. Venous: The inferior vena cava is dilated in size with less than 50% respiratory variability, suggesting right atrial pressure of 15 mmHg. IAS/Shunts: No atrial level shunt detected by color flow Doppler.  LEFT  VENTRICLE PLAX 2D LVIDd:         4.60 cm  Diastology LVIDs:         2.70 cm  LV e' medial:    6.85 cm/s LV PW:         1.50 cm  LV E/e' medial:  13.1 LV IVS:        1.40 cm  LV e' lateral:   5.66 cm/s LVOT diam:     2.10 cm  LV E/e' lateral: 15.8 LV SV:         80 LV SV Index:   35 LVOT Area:     3.46 cm  RIGHT VENTRICLE             IVC RV Basal diam:  3.50 cm     IVC diam: 2.40 cm RV S prime:     10.70 cm/s TAPSE (M-mode): 2.3 cm LEFT ATRIUM             Index       RIGHT ATRIUM           Index LA diam:        3.30 cm 1.46 cm/m  RA Area:     16.60 cm LA Vol (A2C):   50.0 ml 22.06 ml/m RA Volume:   39.10 ml  17.25 ml/m LA Vol (A4C):   54.0 ml 23.83 ml/m LA Biplane Vol: 52.4 ml 23.12 ml/m  AORTIC VALVE LVOT Vmax:   99.90 cm/s LVOT Vmean:  60.400 cm/s LVOT VTI:    0.231 m AI PHT:      543 msec  AORTA Ao Root diam: 3.30 cm Ao Asc diam:  3.40 cm MITRAL VALVE MV Area (PHT): 2.69 cm    SHUNTS MV Decel Time: 282 msec    Systemic VTI:  0.23 m MV E velocity: 89.50 cm/s  Systemic Diam: 2.10 cm MV A velocity: 54.00 cm/s MV E/A ratio:  1.66 Armanda Magic MD Electronically signed by Armanda Magic MD Signature Date/Time: 02/15/2021/2:47:53 PM    Final    CT HEAD CODE STROKE WO CONTRAST  Result Date: 02/15/2021 CLINICAL DATA:  Code stroke.  Visual disturbance.  Stroke suspected. EXAM: CT HEAD WITHOUT CONTRAST TECHNIQUE: Contiguous axial  images were obtained from the base of the skull through the vertex without intravenous contrast. COMPARISON:  MRI 05/27/2020 FINDINGS: Brain: Normal appearance by CT without evidence of atrophy, old or acute infarction, mass lesion, hemorrhage, hydrocephalus or extra-axial collection. Vascular: There is atherosclerotic calcification of the major vessels at the base of the brain. Skull: Negative Sinuses/Orbits: Clear/normal Other: None ASPECTS (Alberta Stroke Program Early CT Score) - Ganglionic level infarction (caudate, lentiform nuclei, internal capsule, insula, M1-M3 cortex): 7 -  Supraganglionic infarction (M4-M6 cortex): 3 Total score (0-10 with 10 being normal): 10 IMPRESSION: 1. Normal head CT for age. Atherosclerotic calcification of the major vessels at the base of the brain. 2. ASPECTS is 10. 3. These results were communicated to Dr. Selina Cooley At 5:00 pmon 3/20/2022by text page via the [facility] messaging system. Electronically Signed   By: Paulina Fusi M.D.   On: 02/15/2021 17:01   CT ANGIO HEAD CODE STROKE  Result Date: 02/15/2021 CLINICAL DATA:  Visual disturbance. Acute stroke suspected. Negative head CT. EXAM: CT ANGIOGRAPHY HEAD AND NECK CT PERFUSION BRAIN TECHNIQUE: Multidetector CT imaging of the head and neck was performed using the standard protocol during bolus administration of intravenous contrast. Multiplanar CT image reconstructions and MIPs were obtained to evaluate the vascular anatomy. Carotid stenosis measurements (when applicable) are obtained utilizing NASCET criteria, using the distal internal carotid diameter as the denominator. Multiphase CT imaging of the brain was performed following IV bolus contrast injection. Subsequent parametric perfusion maps were calculated using RAPID software. CONTRAST:  OMNIPAQUE IOHEXOL 350 MG/ML SOLN COMPARISON:  Head CT earlier same day. FINDINGS: CTA NECK FINDINGS Aortic arch: Aortic atherosclerotic calcification. Branching pattern is normal without origin stenosis. Right carotid system: Common carotid artery widely patent to the bifurcation. No evidence of carotid bifurcation soft or calcified plaque. No stenosis. Some motion degradation. Left carotid system: Common carotid artery widely patent to the bifurcation. No evidence of bifurcation soft or calcified plaque. No stenosis. Some motion degradation. Vertebral arteries: Both vertebral artery origins are widely patent. Both vertebral arteries are patent through the cervical region. Motion degradation. Skeleton: Ordinary cervical spondylosis. Other neck: No mass or  adenopathy.  Negative Upper chest: Negative Review of the MIP images confirms the above findings CTA HEAD FINDINGS Anterior circulation: Both internal carotid arteries are patent through the skull base and siphon regions. There is siphon atherosclerotic calcification with stenosis estimated at 50% on the right and 30% on the left. The anterior and middle cerebral vessels are patent. No large or medium vessel occlusion. No aneurysm or vascular malformation. Posterior circulation: Both vertebral arteries are patent to the basilar. No basilar stenosis. Superior cerebellar and posterior cerebral arteries show flow. Venous sinuses: Patent and normal. Anatomic variants: None significant. Review of the MIP images confirms the above findings CT Brain Perfusion Findings: ASPECTS: 10 CBF (<30%) Volume: 0mL Perfusion (Tmax>6.0s) volume: 0mL Mismatch Volume: 0mL Infarction Location:None IMPRESSION: 1. Some motion degradation. No large or medium vessel occlusion. Negative perfusion study. 2. Aortic atherosclerosis. 3. No carotid bifurcation stenosis. 50% stenosis of the right carotid siphon. 30% stenosis of the left carotid siphon. Aortic Atherosclerosis (ICD10-I70.0). Electronically Signed   By: Paulina Fusi M.D.   On: 02/15/2021 17:54   CT ANGIO NECK CODE STROKE  Result Date: 02/15/2021 CLINICAL DATA:  Visual disturbance. Acute stroke suspected. Negative head CT. EXAM: CT ANGIOGRAPHY HEAD AND NECK CT PERFUSION BRAIN TECHNIQUE: Multidetector CT imaging of the head and neck was performed using the standard protocol during bolus administration of intravenous  contrast. Multiplanar CT image reconstructions and MIPs were obtained to evaluate the vascular anatomy. Carotid stenosis measurements (when applicable) are obtained utilizing NASCET criteria, using the distal internal carotid diameter as the denominator. Multiphase CT imaging of the brain was performed following IV bolus contrast injection. Subsequent parametric perfusion  maps were calculated using RAPID software. CONTRAST:  OMNIPAQUE IOHEXOL 350 MG/ML SOLN COMPARISON:  Head CT earlier same day. FINDINGS: CTA NECK FINDINGS Aortic arch: Aortic atherosclerotic calcification. Branching pattern is normal without origin stenosis. Right carotid system: Common carotid artery widely patent to the bifurcation. No evidence of carotid bifurcation soft or calcified plaque. No stenosis. Some motion degradation. Left carotid system: Common carotid artery widely patent to the bifurcation. No evidence of bifurcation soft or calcified plaque. No stenosis. Some motion degradation. Vertebral arteries: Both vertebral artery origins are widely patent. Both vertebral arteries are patent through the cervical region. Motion degradation. Skeleton: Ordinary cervical spondylosis. Other neck: No mass or adenopathy.  Negative Upper chest: Negative Review of the MIP images confirms the above findings CTA HEAD FINDINGS Anterior circulation: Both internal carotid arteries are patent through the skull base and siphon regions. There is siphon atherosclerotic calcification with stenosis estimated at 50% on the right and 30% on the left. The anterior and middle cerebral vessels are patent. No large or medium vessel occlusion. No aneurysm or vascular malformation. Posterior circulation: Both vertebral arteries are patent to the basilar. No basilar stenosis. Superior cerebellar and posterior cerebral arteries show flow. Venous sinuses: Patent and normal. Anatomic variants: None significant. Review of the MIP images confirms the above findings CT Brain Perfusion Findings: ASPECTS: 10 CBF (<30%) Volume: 0mL Perfusion (Tmax>6.0s) volume: 0mL Mismatch Volume: 0mL Infarction Location:None IMPRESSION: 1. Some motion degradation. No large or medium vessel occlusion. Negative perfusion study. 2. Aortic atherosclerosis. 3. No carotid bifurcation stenosis. 50% stenosis of the right carotid siphon. 30% stenosis of the left  carotid siphon. Aortic Atherosclerosis (ICD10-I70.0). Electronically Signed   By: Paulina Fusi M.D.   On: 02/15/2021 17:54   Telemetry    02/17/21 SB with rates in the 50's  - Personally Reviewed  ECG    No new tracing as of 02/17/21 - Personally Reviewed  Cardiac Studies    Echocardiogram 02/15/2021:  1. Left ventricular ejection fraction, by estimation, is 60 to 65%. The  left ventricle has normal function. The left ventricle has no regional  wall motion abnormalities. There is moderate concentric left ventricular  hypertrophy. Left ventricular diastolic parameters are indeterminate. Elevated left ventricular end-diastolic pressure.  2. Right ventricular systolic function is normal. The right ventricular  size is normal. Tricuspid regurgitation signal is inadequate for assessing  PA pressure.  3. The mitral valve is normal in structure. Trivial mitral valve  regurgitation. No evidence of mitral stenosis.  4. The aortic valve is calcified. Aortic valve regurgitation is mild.  Mild to moderate aortic valve sclerosis/calcification is present, without  any evidence of aortic stenosis. Focal dense calcification of the left  coornary cusp is present. Aortic regurgitation PHT measures 543 msec. Mild to moderate aortic valve sclerosis/calcification is present, without any evidence of aortic stenosis.  5. The inferior vena cava is dilated in size with <50% respiratory  variability, suggesting right atrial pressure of 15 mmHg.   Patient Profile     60 y.o. female with a history of mild aortic stenosis as of 2019, tobacco use, previous stroke, hypertension, type 2 diabetes mellitus, chronic bradycardia, and reported history of previous myocardial infarction now presenting  with chest discomfort  Assessment & Plan    1. Unstable angina: -Plan was for [facility] yesterday however had stroke like symptoms 02/15/21 and underwent brain MRI and CT which were negative for acute changes. Symptoms felt  to be consistent with TIA versus occular migraine>>>felt ok to continue Heparin infusion and presume ok to proceed with LHC.  -Continue ASA, benazepril, statin  2. Chronic sinus bradycardia: -HR's maintaining in the 40-50's  -Verapamil held on admission -Continue to avoid AV blocking agents    3. Aortic stenosis: -Echocardiogram with mild to mod sclerosis without stenosis as above  4. Prior hx of CVA/TIA: -Developed left visual changes on Sunday morning with blurry vision then complete blindness on the left with left sided neglect. CTA head and neck and CT perfusion study were performed which were found to be normal. Symptoms felt to be consistent with TIA   5. DM2: -Hb A1c, 6.3 -SSI for glucose control while inpatient status  -Needs close follow up with PCP in the OP setting -On Trajenta,    6. Hypokalemia: -K+ 3.4 today>>>replace today   7. Chronic SB: -Chronic HR in the 50-60 range>>admission HRs in the 30-40's  -Previously on verapamil>>held on admission    Signed, Georgie Chard NP-C [facility] Pager: [phone] 02/17/2021, 8:41 AM     For questions or updates, please contact   Please consult [email or web address] for contact info under Cardiology/STEMI.

## 2021-02-17 NOTE — Interval H&P Note (Signed)
Cath Lab Visit (complete for each Cath Lab visit)  Clinical Evaluation Leading to the Procedure:   ACS: Yes.    Non-ACS:    Anginal Classification: CCS IV  Anti-ischemic medical therapy: Minimal Therapy (1 class of medications)  Non-Invasive Test Results: No non-invasive testing performed  Prior CABG: No previous CABG      History and Physical Interval Note:  02/17/2021 4:15 PM  Amber Harris  has presented today for surgery, with the diagnosis of unstable angina.  The various methods of treatment have been discussed with the patient and family. After consideration of risks, benefits and other options for treatment, the patient has consented to  Procedure(s): LEFT HEART CATH AND CORONARY ANGIOGRAPHY (N/A) as a surgical intervention.  The patient's history has been reviewed, patient examined, no change in status, stable for surgery.  I have reviewed the patient's chart and labs.  Questions were answered to the patient's satisfaction.     Sherren Mocha

## 2021-02-18 ENCOUNTER — Telehealth: Payer: Self-pay | Admitting: Physician Assistant

## 2021-02-18 ENCOUNTER — Encounter (HOSPITAL_COMMUNITY): Payer: Self-pay | Admitting: Cardiovascular Disease

## 2021-02-18 ENCOUNTER — Telehealth: Payer: Self-pay | Admitting: Cardiology

## 2021-02-18 DIAGNOSIS — I2510 Atherosclerotic heart disease of native coronary artery without angina pectoris: Secondary | ICD-10-CM | POA: Diagnosis not present

## 2021-02-18 DIAGNOSIS — L0390 Cellulitis, unspecified: Secondary | ICD-10-CM | POA: Diagnosis not present

## 2021-02-18 DIAGNOSIS — Z6837 Body mass index (BMI) 37.0-37.9, adult: Secondary | ICD-10-CM | POA: Diagnosis not present

## 2021-02-18 MED FILL — Lidocaine HCl Local Inj 1%: INTRAMUSCULAR | Qty: 2 | Status: AC

## 2021-02-18 NOTE — Telephone Encounter (Signed)
Left message to call back  

## 2021-02-18 NOTE — Telephone Encounter (Signed)
Patient was discharged from short stay yesterday prior to the completion of medication reconciliation.   Please contact the patient to make sure she is aware of the medication changes. I did leave her a message just now, but need to follow up and make sure she is aware later.  1. Verapamil has been discontinued as she had occasional slow heart rate in the hospital. The discontinuation of verapmil allows the heart rate to come up.   2. Clonidine dose was reduced, she was previous on 0.3mg  TID dosing, this has been reduced to 0.2mg  TID, new Rx sent to her pharmacy  3. verify she is taking 40mg  daily of lasix at home.   Thank you

## 2021-02-18 NOTE — Telephone Encounter (Signed)
   Pt is calling back and asked to speak with Amber Harris, I told her the call she got from [place] today is the reason why [facility] call. She understood. She does have a question about her procedure yesterday her pcp wants to know if there was a stent put in her. She also said, she saw her pcp today because her arm where the IV went was so red and sipping with Pus, her pcp confirmed that it is infected and prescribed her medication for it. She also said that her pcp removed the cast like board on her arm, she remembered that the doctor told her to keep it on for 7 days.

## 2021-02-18 NOTE — Telephone Encounter (Signed)
Nurse went over medication changes with pt and she verbalized understanding. Pt also confirmed she is taking 40 mg of lasix daily.

## 2021-02-18 NOTE — Telephone Encounter (Signed)
Pt called in returning call to [place],   [place] number [phone]

## 2021-02-18 NOTE — Telephone Encounter (Signed)
1. Widely patent coronary arteries with no obstructive CAD 2. Normal LV function  Suspect noncardiac symptoms  The above heart cath results read to patient. Advised she did not have stents placed with recent cath. Verbalized understanding.  Patient is requesting recent hospital records be sent to her PCP ([name]) Will forward to medical records department.

## 2021-02-19 NOTE — Telephone Encounter (Signed)
Thank you :)

## 2021-02-25 DIAGNOSIS — E1143 Type 2 diabetes mellitus with diabetic autonomic (poly)neuropathy: Secondary | ICD-10-CM | POA: Diagnosis not present

## 2021-02-25 DIAGNOSIS — I10 Essential (primary) hypertension: Secondary | ICD-10-CM | POA: Diagnosis not present

## 2021-03-08 NOTE — Progress Notes (Signed)
Cardiology Office Note  Date: 03/09/2021   ID: Amber Harris, DOB 19-Oct-1961, MRN [id number]  PCP:  Toma Deiters, MD  Cardiologist:  Dina Rich, MD Electrophysiologist:  None   Chief Complaint: Hospital follow up.  History of Present Illness: NA Amber Harris is a 60 y.o. female with a history of CP, bradycardia , DM2, HTN, obesity, tobacco user, HLD. ASD.  Longstanding hx of exertional syncope.   Recent presentation for CP at OSH. Transferred to [facility] for further workup. Troponins were negative x 2. CT negative for PE, EKG SB with HR 46. Pain recurred and relieved with NTG.  CT neck with 50% RICA stenosis, 30% LICA stenosis. Underwent cardiac catheterization : widely patent coronary arteries. No obstructive CAD. CP felt to be non-cardiac in origin.   She is here for follow-up status post recent hospitalization for chest pain and subsequent cardiac catheterization.  She denies any further issues with chest pain, shortness of breath, palpitations.  States she did have an episode when she was at the hospital with a visual disturbance and had a CT of her head/neck.  CT of head was normal for age.  Atherosclerotic calcification of major vessels at the base of the brain.  CT angio of the head for stroke showed 50% stenosis of the right carotid siphon and 30% stenosis of the left carotid siphon.  Evidence of aortic atherosclerosis.  MRI of the brain showed no acute intracranial abnormality.  Chronic small vessel disease with minimal progression from an MRI the prior year.  States her blood pressure was elevated during that time.  Blood pressure today on arrival was 142/84.  Patient states at home her blood pressure consistently stays in the 160s to 180s systolic.  Current antihypertensive medications include; chlorthalidone 50 mg daily, clonidine 0.2 mg p.o. 3 times daily.  Lasix 40 mg daily.  Benazepril 20 mg daily.  She continues to smoke but states she is attempting to cut down.  She denies  any CVA or TIA-like symptoms, orthostatic symptoms, PND, orthopnea, bleeding.  No claudication-like symptoms, DVT or PE-like symptoms, lower extremity edema.  She had carotid stenosis noted on recent CT of her neck during hospital stay.  Patient states her heart rates have been low recently.  She was told during the hospital stay her heart rate got down to 46.  Today her heart rate is 65.  She states when her heart rate was down in the 40s she felt bad.  She is not on any AV nodal blockers.   Past Medical History:  Diagnosis Date  . Aortic valve disease    Long-standing systolic murmur  . Asthma    Uses p.r.n. albuterol  . Bronchitis   . COPD (chronic obstructive pulmonary disease) (HCC)   . Degenerative joint disease    right knee  . Depression   . Depression with anxiety   . Diabetes mellitus   . Dyspnea    exertion  . Dyspnea on exertion    poor exercise tolerance  . Fibroids    uterine; postmenopausal bleeding  . GERD (gastroesophageal reflux disease)   . Headache(784.0)   . Hyperlipidemia   . Hypertension   . Myocardial infarction (HCC)   . Obesity   . Palpitations   . Stroke [facility])    TIA - left side residual  . Syncope    exertional  . Tobacco user    30 pack years; 04/2011: 1/4 pack per day during quick attempt    Past  Surgical History:  Procedure Laterality Date  . ABDOMINAL HYSTERECTOMY  12/21/2011   Procedure: HYSTERECTOMY ABDOMINAL;  Surgeon: Tilda Burrow, MD;  Location: [facility]S;  Service: Gynecology;  Laterality: N/A;  . CATARACT EXTRACTION W/PHACO Left 10/03/2020   Procedure: CATARACT EXTRACTION PHACO AND INTRAOCULAR LENS PLACEMENT LEFT EYE;  Surgeon: Fabio Pierce, MD;  Location: [facility];  Service: Ophthalmology;  Laterality: Left;  CDE 5.04  . CATARACT EXTRACTION W/PHACO Right 10/17/2020   Procedure: CATARACT EXTRACTION PHACO AND INTRAOCULAR LENS PLACEMENT RIGHT EYE;  Surgeon: Fabio Pierce, MD;  Location: [facility];  Service: Ophthalmology;  Laterality: Right;   CDE: 4.60  . LEFT HEART CATH AND CORONARY ANGIOGRAPHY N/A 02/17/2021   Procedure: LEFT HEART CATH AND CORONARY ANGIOGRAPHY;  Surgeon: Tonny Bollman, MD;  Location: [facility] INVASIVE CV LAB;  Service: Cardiovascular;  Laterality: N/A;  . TUBAL LIGATION      Current Outpatient Medications  Medication Sig Dispense Refill  . albuterol (PROVENTIL) (5 MG/ML) 0.5% nebulizer solution Take 2.5 mg by nebulization 3 (three) times daily as needed for wheezing or shortness of breath.     Marland Kitchen albuterol (PROVENTIL,VENTOLIN) 90 MCG/ACT inhaler Inhale 2 puffs into the lungs every 6 (six) hours as needed for wheezing or shortness of breath.     Marland Kitchen aspirin EC 81 MG tablet Take 81 mg by mouth daily.    . chlorthalidone (HYGROTON) 50 MG tablet Take 50 mg by mouth daily.    . cloNIDine (CATAPRES) 0.2 MG tablet Take 1 tablet (0.2 mg total) by mouth 3 (three) times daily. 90 tablet 2  . estradiol (ESTRACE) 1 MG tablet Take 1 mg by mouth daily.    . Fluticasone-Umeclidin-Vilant (TRELEGY ELLIPTA) 100-62.5-25 MCG/INH AEPB Inhale 1 puff into the lungs daily.    . furosemide (LASIX) 40 MG tablet Take 1 tablet (40 mg total) by mouth daily. 30 tablet 2  . insulin aspart (NOVOLOG) 100 UNIT/ML injection Inject 5 Units into the skin 3 (three) times daily after meals.    . insulin degludec (TRESIBA FLEXTOUCH) 100 UNIT/ML FlexTouch Pen Inject 20 Units into the skin at bedtime.    Marland Kitchen JANUMET 50-500 MG tablet Take 1 tablet by mouth 2 (two) times daily.    Marland Kitchen JARDIANCE 25 MG TABS tablet Take 25 mg by mouth daily.     . methocarbamol (ROBAXIN) 500 MG tablet Take 500 mg by mouth at bedtime.    . Multiple Vitamins-Minerals (MULTIVITAMIN ADULT) CHEW Chew 1 tablet by mouth daily.    . risperiDONE (RISPERDAL) 0.5 MG tablet Take 0.5 mg by mouth at bedtime.    . rosuvastatin (CRESTOR) 40 MG tablet Take 1 tablet (40 mg total) by mouth daily. 30 tablet 3  . topiramate (TOPAMAX) 50 MG tablet Take 50 mg by mouth 2 (two) times daily as needed  (headaches).    . benazepril (LOTENSIN) 40 MG tablet Take 1 tablet (40 mg total) by mouth daily. 90 tablet 3   No current facility-administered medications for this visit.   Allergies:  Aspirin and Penicillins   Social History: The patient  reports that she has been smoking cigarettes. She started smoking about 46 years ago. She has a 15.00 pack-year smoking history. She has never used smokeless tobacco. She reports current drug use. Drug: Marijuana. She reports that she does not drink alcohol.   Family History: The patient's family history includes Breast cancer in her sister; Cancer in her cousin; Heart disease in her brother and maternal aunt; Lung cancer in her mother.  ROS:  Please see the history of present illness. Otherwise, complete review of systems is positive for none.  All other systems are reviewed and negative.   Physical Exam: VS:  BP (!) 142/84   Pulse 65   Ht 5\' 9"  (1.753 m)   Wt 253 lb 3.2 oz (114.9 kg)   LMP 10/25/2011   SpO2 98%   BMI 37.39 kg/m , BMI Body mass index is 37.39 kg/m.  Wt Readings from Last 3 Encounters:  03/09/21 253 lb 3.2 oz (114.9 kg)  02/17/21 248 lb 8 oz (112.7 kg)  10/01/20 252 lb (114.3 kg)    General: Patient appears comfortable at rest. Neck: Supple, no elevated JVP positive carotid bruits right side, no thyromegaly. Lungs: Clear to auscultation, nonlabored breathing at rest. Cardiac: Regular rate and rhythm, no S3 or significant systolic murmur, no pericardial rub. Extremities: No pitting edema, distal pulses 2+. Skin: Warm and dry. Musculoskeletal: No kyphosis. Neuropsychiatric: Alert and oriented x3, affect grossly appropriate.  ECG:  EKG February 16, 2021 sinus bradycardia with a rate of 51.  Recent Labwork: 02/14/2021: ALT 26; AST 28; B Natriuretic Peptide 70.1; Magnesium 2.1 02/15/2021: TSH 0.544 02/17/2021: BUN 22; Creatinine, Ser 1.20; Hemoglobin 12.5; Platelets 146; Potassium 3.1; Sodium 137     Component Value Date/Time    CHOL 148 02/16/2021 0419   TRIG 42 02/16/2021 0419   HDL 51 02/16/2021 0419   CHOLHDL 2.9 02/16/2021 0419   VLDL 8 02/16/2021 0419   LDLCALC 89 02/16/2021 0419    Other Studies Reviewed Today:   MRI of the brain without contrast 02/16/2021 Study Result  Narrative & Impression  CLINICAL DATA:  60 year old female code stroke presentation with unrevealing CTA, CTP.  EXAM: MRI HEAD WITHOUT CONTRAST  TECHNIQUE: Multiplanar, multiecho pulse sequences of the brain and surrounding structures were obtained without intravenous contrast.  COMPARISON:  CTA, CT head and CTP yesterday. [facility] Brain MRI 05/27/2020, 10/27/2014.  FINDINGS: Brain: No restricted diffusion or evidence of acute infarction. Chronic prominent lacunar infarct of the right pons again noted. Small area of focal encephalomalacia in the body of the corpus callosum is stable.  No midline shift, mass effect, evidence of mass lesion, ventriculomegaly, extra-axial collection or acute intracranial hemorrhage. Cervicomedullary junction within normal limits. Chronic partially empty sella.  Mild T2 hyperintensity in the left basal ganglia has progressed since last year. Otherwise stable gray and white matter signal throughout the brain. No cortical encephalomalacia or chronic cerebral blood products identified.  Vascular: Major intracranial vascular flow voids are stable since last year.  Skull and upper cervical spine: Bone marrow signal is stable since 2015. Partially visible cervical spine disc degeneration.  Sinuses/Orbits: Postoperative changes to both globes since last year. Orbits otherwise appears stable, negative.  Paranasal sinuses and mastoids are stable and well pneumatized.  Other: Grossly normal visible internal auditory structures. Visible scalp and face appear negative.  IMPRESSION: 1. No acute intracranial abnormality. 2. Chronic small vessel disease with minimal  progression from an MRI last year.      CT angiogram of head and neck.  02/15/2021 CLINICAL DATA:  Visual disturbance. Acute stroke suspected. Negative head CT.  EXAM: CT ANGIOGRAPHY HEAD AND NECK  CT PERFUSION BRAIN  TECHNIQUE: Multidetector CT imaging of the head and neck was performed using the standard protocol during bolus administration of intravenous contrast. Multiplanar CT image reconstructions and MIPs were obtained to evaluate the vascular anatomy. Carotid stenosis measurements (when applicable) are obtained utilizing NASCET criteria, using the  distal internal carotid diameter as the denominator.  Multiphase CT imaging of the brain was performed following IV bolus contrast injection. Subsequent parametric perfusion maps were calculated using RAPID software.  CONTRAST:  OMNIPAQUE IOHEXOL 350 MG/ML SOLN  COMPARISON:  Head CT earlier same day.  FINDINGS: CTA NECK FINDINGS  Aortic arch: Aortic atherosclerotic calcification. Branching pattern is normal without origin stenosis.  Right carotid system: Common carotid artery widely patent to the bifurcation. No evidence of carotid bifurcation soft or calcified plaque. No stenosis. Some motion degradation.  Left carotid system: Common carotid artery widely patent to the bifurcation. No evidence of bifurcation soft or calcified plaque. No stenosis. Some motion degradation.  Vertebral arteries: Both vertebral artery origins are widely patent. Both vertebral arteries are patent through the cervical region. Motion degradation.  Skeleton: Ordinary cervical spondylosis.  Other neck: No mass or adenopathy.  Negative  Upper chest: Negative  Review of the MIP images confirms the above findings  CTA HEAD FINDINGS  Anterior circulation: Both internal carotid arteries are patent through the skull base and siphon regions. There is siphon atherosclerotic calcification with stenosis estimated at  50% on the right and 30% on the left. The anterior and middle cerebral vessels are patent. No large or medium vessel occlusion. No aneurysm or vascular malformation.  Posterior circulation: Both vertebral arteries are patent to the basilar. No basilar stenosis. Superior cerebellar and posterior cerebral arteries show flow.  Venous sinuses: Patent and normal.  Anatomic variants: None significant.  Review of the MIP images confirms the above findings  CT Brain Perfusion Findings:  ASPECTS: 10  CBF (<30%) Volume: 0mL  Perfusion (Tmax>6.0s) volume: 0mL  Mismatch Volume: 0mL  Infarction Location:None  IMPRESSION: 1. Some motion degradation. No large or medium vessel occlusion. Negative perfusion study. 2. Aortic atherosclerosis. 3. No carotid bifurcation stenosis. 50% stenosis of the right carotid siphon. 30% stenosis of the left carotid siphon.  Aortic Atherosclerosis (ICD10-I70.0).   CT of the head without contrast 02/15/2021 Narrative & Impression  CLINICAL DATA:  Code stroke.  Visual disturbance.  Stroke suspected.  EXAM: CT HEAD WITHOUT CONTRAST  TECHNIQUE: Contiguous axial images were obtained from the base of the skull through the vertex without intravenous contrast.  COMPARISON:  MRI 05/27/2020  FINDINGS: Brain: Normal appearance by CT without evidence of atrophy, old or acute infarction, mass lesion, hemorrhage, hydrocephalus or extra-axial collection.  Vascular: There is atherosclerotic calcification of the major vessels at the base of the brain.  Skull: Negative  Sinuses/Orbits: Clear/normal  Other: None  ASPECTS (Alberta Stroke Program Early CT Score)  - Ganglionic level infarction (caudate, lentiform nuclei, internal capsule, insula, M1-M3 cortex): 7  - Supraganglionic infarction (M4-M6 cortex): 3  Total score (0-10 with 10 being normal): 10  IMPRESSION: 1. Normal head CT for age. Atherosclerotic calcification  of the major vessels at the base of the brain. 2. ASPECTS is 10. 3. These results were communicated to Dr. Selina Cooley At 5:00 pmon 3/20/2022by text page via the [facility] messaging system.         Echo 02/15/2021 1. Left ventricular ejection fraction, by estimation, is 60 to 65%. The  left ventricle has normal function. The left ventricle has no regional  wall motion abnormalities. There is moderate concentric left ventricular  hypertrophy. Left ventricular  diastolic parameters are indeterminate. Elevated left ventricular  end-diastolic pressure.  2. Right ventricular systolic function is normal. The right ventricular  size is normal. Tricuspid regurgitation signal is inadequate for assessing  PA pressure.  3. The mitral valve is normal in structure. Trivial mitral valve  regurgitation. No evidence of mitral stenosis.  4. The aortic valve is calcified. Aortic valve regurgitation is mild.  Mild to moderate aortic valve sclerosis/calcification is present, without  any evidence of aortic stenosis. Focal dense calcification of the left  coornary cusp is present. Aortic  regurgitation PHT measures 543 msec.  5. The inferior vena cava is dilated in size with <50% respiratory  variability, suggesting right atrial pressure of 15 mmHg.    MRI of brain 02/16/2021 IMPRESSION: 1. No acute intracranial abnormality. 2. Chronic small vessel disease with minimal progression from an MRI last year.   Cath 02/17/2021 1. Widely patent coronary arteries with no obstructive CAD 2. Normal LV function  Suspect noncardiac symptoms Diagnostic Dominance: Right     Assessment and Plan:  1. Essential hypertension   2. Mixed hyperlipidemia   3. Tobacco user   4. Morbid obesity (HCC)   5. TIA (transient ischemic attack)   6. Chest pain, unspecified type   7. Carotid stenosis, asymptomatic, bilateral    1. Essential hypertension Complaining of elevated blood pressures at home. States  at home when she checks her blood pressure systolics are running anywhere from the 160s to 180s.  Blood pressure today on arrival is 142/84.  Recent ER presentation with elevated blood pressure and strokelike symptoms.  Increase benazepril to 40 mg daily.  Continue chlorthalidone 50 mg daily.  Continue clonidine 0.2 mg 3 times daily.  Get basic metabolic panel and magnesium in 2 weeks prior to follow-up.  Start checking your blood pressures about 1 to 2 hours after taking antihypertensive medications.  Write them down and bring them with you at follow-up in 2 weeks.  2. Mixed hyperlipidemia Continue Crestor 40 mg daily.  Lipid panel 02/16/2021: Total cholesterol 148, triglycerides 42, HDL 51, LDL 89.  3. Tobacco user History of long-term smoking.  Patient states she is cutting way down on her smoking.  Encouraged cessation.  4. Morbid obesity (HCC) Advised dietary modifications and weight loss.  5.  TIA/CVA-like symptoms Recent presentation to the emergency room for chest pain with elevated blood pressure, visual disturbance.  Patient had multiple neuro studies as noted above.  No current CVA or TIA-like symptoms.  Continue aspirin 81 mg daily.  6.  Carotid bruit Carotid bruit heard on right side.  Recent CT angio head and neck showed right carotid system with common carotid artery widely patent to the bifurcation.  No evidence of carotid bifurcation soft or calcified plaque.  No stenosis.  Some motion degradation.  Left carotid system: Common carotid artery widely patent to the bifurcation.  No evidence of bifurcation soft of calcified plaque.  No stenosis, some motion degradation.  Vertebral artery origins widely patent through the cervical region.  Get a follow-up carotid study in 1 year.  Patient has an area on the left side of her neck that appears to be possibly a lipoma.  Medication Adjustments/Labs and Tests Ordered: Current medicines are reviewed at length with the patient today.  Concerns  regarding medicines are outlined above.   Disposition: Follow-up with Dr. Wyline Mood or APP 2 weeks  Signed, Rennis Harding, NP 03/09/2021 4:19 PM    [facility] at [facility] [address] [place], [place], [place] [address] Phone: [phone]; Fax: [phone]

## 2021-03-09 ENCOUNTER — Ambulatory Visit (INDEPENDENT_AMBULATORY_CARE_PROVIDER_SITE_OTHER): Payer: Medicare HMO | Admitting: Family Medicine

## 2021-03-09 ENCOUNTER — Encounter: Payer: Self-pay | Admitting: Family Medicine

## 2021-03-09 VITALS — BP 142/84 | HR 65 | Ht 69.0 in | Wt 253.2 lb

## 2021-03-09 DIAGNOSIS — I6523 Occlusion and stenosis of bilateral carotid arteries: Secondary | ICD-10-CM

## 2021-03-09 DIAGNOSIS — R079 Chest pain, unspecified: Secondary | ICD-10-CM | POA: Diagnosis not present

## 2021-03-09 DIAGNOSIS — Z720 Tobacco use: Secondary | ICD-10-CM

## 2021-03-09 DIAGNOSIS — E782 Mixed hyperlipidemia: Secondary | ICD-10-CM | POA: Diagnosis not present

## 2021-03-09 DIAGNOSIS — G459 Transient cerebral ischemic attack, unspecified: Secondary | ICD-10-CM

## 2021-03-09 DIAGNOSIS — I10 Essential (primary) hypertension: Secondary | ICD-10-CM

## 2021-03-09 MED ORDER — BENAZEPRIL HCL 40 MG PO TABS: 40.0000 mg | ORAL_TABLET | Freq: Every day | ORAL | 3 refills | Status: DC

## 2021-03-09 NOTE — Patient Instructions (Signed)
Medication Instructions:   Increase Benazepril to 40mg  daily.   Continue all other medications.    Labwork:  BMET, Mg - orders given today.   Please do prior to next office visit   Testing/Procedures: none  Follow-Up: 2 weeks   Any Other Special Instructions Will Be Listed Below (If Applicable). Bring blood pressure readings to next visit as well.   If you need a refill on your cardiac medications before your next appointment, please call your pharmacy.

## 2021-03-20 DIAGNOSIS — I10 Essential (primary) hypertension: Secondary | ICD-10-CM | POA: Diagnosis not present

## 2021-03-22 NOTE — Progress Notes (Deleted)
Cardiology Office Note  Date: 03/22/2021   ID: Amber Harris, DOB January 14, 1961, MRN [id number]  PCP:  Toma Deiters, MD  Cardiologist:  Dina Rich, MD Electrophysiologist:  None   Chief Complaint: Hospital follow up.  History of Present Illness: Amber Harris is a 60 y.o. female with a history of CP, bradycardia , DM2, HTN, obesity, tobacco user, HLD. ASD.  Longstanding hx of exertional syncope.   Recent presentation for CP at OSH. Transferred to [facility] for further workup. Troponins were negative x 2. CT negative for PE, EKG SB with HR 46. Pain recurred and relieved with NTG.  CT neck with 50% RICA stenosis, 30% LICA stenosis. Underwent cardiac catheterization : widely patent coronary arteries. No obstructive CAD. CP felt to be non-cardiac in origin.   She is here for follow-up status post recent hospitalization for chest pain and subsequent cardiac catheterization.  She denies any further issues with chest pain, shortness of breath, palpitations.  States she did have an episode when she was at the hospital with a visual disturbance and had a CT of her head/neck.  CT of head was normal for age.  Atherosclerotic calcification of major vessels at the base of the brain.  CT angio of the head for stroke showed 50% stenosis of the right carotid siphon and 30% stenosis of the left carotid siphon.  Evidence of aortic atherosclerosis.  MRI of the brain showed no acute intracranial abnormality.  Chronic small vessel disease with minimal progression from an MRI the prior year.  States her blood pressure was elevated during that time.  Blood pressure today on arrival was 142/84.  Patient states at home her blood pressure consistently stays in the 160s to 180s systolic.  Current antihypertensive medications include; chlorthalidone 50 mg daily, clonidine 0.2 mg p.o. 3 times daily.  Lasix 40 mg daily.  Benazepril 20 mg daily.  She continues to smoke but states she is attempting to cut down.  She denies  any CVA or TIA-like symptoms, orthostatic symptoms, PND, orthopnea, bleeding.  No claudication-like symptoms, DVT or PE-like symptoms, lower extremity edema.  She had carotid stenosis noted on recent CT of her neck during hospital stay.  Patient states her heart rates have been low recently.  She was told during the hospital stay her heart rate got down to 46.  Today her heart rate is 65.  She states when her heart rate was down in the 40s she felt bad.  She is not on any AV nodal blockers.   Past Medical History:  Diagnosis Date  . Aortic valve disease    Long-standing systolic murmur  . Asthma    Uses p.r.n. albuterol  . Bronchitis   . COPD (chronic obstructive pulmonary disease) (HCC)   . Degenerative joint disease    right knee  . Depression   . Depression with anxiety   . Diabetes mellitus   . Dyspnea    exertion  . Dyspnea on exertion    poor exercise tolerance  . Fibroids    uterine; postmenopausal bleeding  . GERD (gastroesophageal reflux disease)   . Headache(784.0)   . Hyperlipidemia   . Hypertension   . Myocardial infarction (HCC)   . Obesity   . Palpitations   . Stroke [facility])    TIA - left side residual  . Syncope    exertional  . Tobacco user    30 pack years; 04/2011: 1/4 pack per day during quick attempt    Past  Surgical History:  Procedure Laterality Date  . ABDOMINAL HYSTERECTOMY  12/21/2011   Procedure: HYSTERECTOMY ABDOMINAL;  Surgeon: Tilda Burrow, MD;  Location: [facility];  Service: Gynecology;  Laterality: N/A;  . CATARACT EXTRACTION W/PHACO Left 10/03/2020   Procedure: CATARACT EXTRACTION PHACO AND INTRAOCULAR LENS PLACEMENT LEFT EYE;  Surgeon: Fabio Pierce, MD;  Location: [facility];  Service: Ophthalmology;  Laterality: Left;  CDE 5.04  . CATARACT EXTRACTION W/PHACO Right 10/17/2020   Procedure: CATARACT EXTRACTION PHACO AND INTRAOCULAR LENS PLACEMENT RIGHT EYE;  Surgeon: Fabio Pierce, MD;  Location: [facility];  Service: Ophthalmology;  Laterality: Right;   CDE: 4.60  . LEFT HEART CATH AND CORONARY ANGIOGRAPHY N/A 02/17/2021   Procedure: LEFT HEART CATH AND CORONARY ANGIOGRAPHY;  Surgeon: Tonny Bollman, MD;  Location: [facility] INVASIVE CV LAB;  Service: Cardiovascular;  Laterality: N/A;  . TUBAL LIGATION      Current Outpatient Medications  Medication Sig Dispense Refill  . albuterol (PROVENTIL) (5 MG/ML) 0.5% nebulizer solution Take 2.5 mg by nebulization 3 (three) times daily as needed for wheezing or shortness of breath.     Marland Kitchen albuterol (PROVENTIL,VENTOLIN) 90 MCG/ACT inhaler Inhale 2 puffs into the lungs every 6 (six) hours as needed for wheezing or shortness of breath.     Marland Kitchen aspirin EC 81 MG tablet Take 81 mg by mouth daily.    . benazepril (LOTENSIN) 40 MG tablet Take 1 tablet (40 mg total) by mouth daily. 90 tablet 3  . chlorthalidone (HYGROTON) 50 MG tablet Take 50 mg by mouth daily.    . cloNIDine (CATAPRES) 0.2 MG tablet Take 1 tablet (0.2 mg total) by mouth 3 (three) times daily. 90 tablet 2  . estradiol (ESTRACE) 1 MG tablet Take 1 mg by mouth daily.    . Fluticasone-Umeclidin-Vilant (TRELEGY ELLIPTA) 100-62.5-25 MCG/INH AEPB Inhale 1 puff into the lungs daily.    . furosemide (LASIX) 40 MG tablet Take 1 tablet (40 mg total) by mouth daily. 30 tablet 2  . insulin aspart (NOVOLOG) 100 UNIT/ML injection Inject 5 Units into the skin 3 (three) times daily after meals.    . insulin degludec (TRESIBA FLEXTOUCH) 100 UNIT/ML FlexTouch Pen Inject 20 Units into the skin at bedtime.    Marland Kitchen JANUMET 50-500 MG tablet Take 1 tablet by mouth 2 (two) times daily.    Marland Kitchen JARDIANCE 25 MG TABS tablet Take 25 mg by mouth daily.     . methocarbamol (ROBAXIN) 500 MG tablet Take 500 mg by mouth at bedtime.    . Multiple Vitamins-Minerals (MULTIVITAMIN ADULT) CHEW Chew 1 tablet by mouth daily.    . risperiDONE (RISPERDAL) 0.5 MG tablet Take 0.5 mg by mouth at bedtime.    . rosuvastatin (CRESTOR) 40 MG tablet Take 1 tablet (40 mg total) by mouth daily. 30 tablet 3   . topiramate (TOPAMAX) 50 MG tablet Take 50 mg by mouth 2 (two) times daily as needed (headaches).     No current facility-administered medications for this visit.   Allergies:  Aspirin and Penicillins   Social History: The patient  reports that she has been smoking cigarettes. She started smoking about 46 years ago. She has a 15.00 pack-year smoking history. She has never used smokeless tobacco. She reports current drug use. Drug: Marijuana. She reports that she does not drink alcohol.   Family History: The patient's family history includes Breast cancer in her sister; Cancer in her cousin; Heart disease in her brother and maternal aunt; Lung cancer in her mother.  ROS:  Please see the history of present illness. Otherwise, complete review of systems is positive for none.  All other systems are reviewed and negative.   Physical Exam: VS:  LMP 10/25/2011 , BMI There is no height or weight on file to calculate BMI.  Wt Readings from Last 3 Encounters:  03/09/21 253 lb 3.2 oz (114.9 kg)  02/17/21 248 lb 8 oz (112.7 kg)  10/01/20 252 lb (114.3 kg)    General: Patient appears comfortable at rest. Neck: Supple, no elevated JVP positive carotid bruits right side, no thyromegaly. Lungs: Clear to auscultation, nonlabored breathing at rest. Cardiac: Regular rate and rhythm, no S3 or significant systolic murmur, no pericardial rub. Extremities: No pitting edema, distal pulses 2+. Skin: Warm and dry. Musculoskeletal: No kyphosis. Neuropsychiatric: Alert and oriented x3, affect grossly appropriate.  ECG:  EKG February 16, 2021 sinus bradycardia with a rate of 51.  Recent Labwork: 02/14/2021: ALT 26; AST 28; B Natriuretic Peptide 70.1; Magnesium 2.1 02/15/2021: TSH 0.544 02/17/2021: BUN 22; Creatinine, Ser 1.20; Hemoglobin 12.5; Platelets 146; Potassium 3.1; Sodium 137     Component Value Date/Time   CHOL 148 02/16/2021 0419   TRIG 42 02/16/2021 0419   HDL 51 02/16/2021 0419   CHOLHDL 2.9  02/16/2021 0419   VLDL 8 02/16/2021 0419   LDLCALC 89 02/16/2021 0419    Other Studies Reviewed Today:   MRI of the brain without contrast 02/16/2021 Study Result  Narrative & Impression  CLINICAL DATA:  60 year old female code stroke presentation with unrevealing CTA, CTP.  EXAM: MRI HEAD WITHOUT CONTRAST  TECHNIQUE: Multiplanar, multiecho pulse sequences of the brain and surrounding structures were obtained without intravenous contrast.  COMPARISON:  CTA, CT head and CTP yesterday. [facility] Brain MRI 05/27/2020, 10/27/2014.  FINDINGS: Brain: No restricted diffusion or evidence of acute infarction. Chronic prominent lacunar infarct of the right pons again noted. Small area of focal encephalomalacia in the body of the corpus callosum is stable.  No midline shift, mass effect, evidence of mass lesion, ventriculomegaly, extra-axial collection or acute intracranial hemorrhage. Cervicomedullary junction within normal limits. Chronic partially empty sella.  Mild T2 hyperintensity in the left basal ganglia has progressed since last year. Otherwise stable gray and white matter signal throughout the brain. No cortical encephalomalacia or chronic cerebral blood products identified.  Vascular: Major intracranial vascular flow voids are stable since last year.  Skull and upper cervical spine: Bone marrow signal is stable since 2015. Partially visible cervical spine disc degeneration.  Sinuses/Orbits: Postoperative changes to both globes since last year. Orbits otherwise appears stable, negative.  Paranasal sinuses and mastoids are stable and well pneumatized.  Other: Grossly normal visible internal auditory structures. Visible scalp and face appear negative.  IMPRESSION: 1. No acute intracranial abnormality. 2. Chronic small vessel disease with minimal progression from an MRI last year.      CT angiogram of head and neck.   02/15/2021 CLINICAL DATA:  Visual disturbance. Acute stroke suspected. Negative head CT.  EXAM: CT ANGIOGRAPHY HEAD AND NECK  CT PERFUSION BRAIN  TECHNIQUE: Multidetector CT imaging of the head and neck was performed using the standard protocol during bolus administration of intravenous contrast. Multiplanar CT image reconstructions and MIPs were obtained to evaluate the vascular anatomy. Carotid stenosis measurements (when applicable) are obtained utilizing NASCET criteria, using the distal internal carotid diameter as the denominator.  Multiphase CT imaging of the brain was performed following IV bolus contrast injection. Subsequent parametric perfusion maps were calculated using RAPID  software.  CONTRAST:  OMNIPAQUE IOHEXOL 350 MG/ML SOLN  COMPARISON:  Head CT earlier same day.  FINDINGS: CTA NECK FINDINGS  Aortic arch: Aortic atherosclerotic calcification. Branching pattern is normal without origin stenosis.  Right carotid system: Common carotid artery widely patent to the bifurcation. No evidence of carotid bifurcation soft or calcified plaque. No stenosis. Some motion degradation.  Left carotid system: Common carotid artery widely patent to the bifurcation. No evidence of bifurcation soft or calcified plaque. No stenosis. Some motion degradation.  Vertebral arteries: Both vertebral artery origins are widely patent. Both vertebral arteries are patent through the cervical region. Motion degradation.  Skeleton: Ordinary cervical spondylosis.  Other neck: No mass or adenopathy.  Negative  Upper chest: Negative  Review of the MIP images confirms the above findings  CTA HEAD FINDINGS  Anterior circulation: Both internal carotid arteries are patent through the skull base and siphon regions. There is siphon atherosclerotic calcification with stenosis estimated at 50% on the right and 30% on the left. The anterior and middle cerebral  vessels are patent. No large or medium vessel occlusion. No aneurysm or vascular malformation.  Posterior circulation: Both vertebral arteries are patent to the basilar. No basilar stenosis. Superior cerebellar and posterior cerebral arteries show flow.  Venous sinuses: Patent and normal.  Anatomic variants: None significant.  Review of the MIP images confirms the above findings  CT Brain Perfusion Findings:  ASPECTS: 10  CBF (<30%) Volume: 0mL  Perfusion (Tmax>6.0s) volume: 0mL  Mismatch Volume: 0mL  Infarction Location:None  IMPRESSION: 1. Some motion degradation. No large or medium vessel occlusion. Negative perfusion study. 2. Aortic atherosclerosis. 3. No carotid bifurcation stenosis. 50% stenosis of the right carotid siphon. 30% stenosis of the left carotid siphon.  Aortic Atherosclerosis (ICD10-I70.0).   CT of the head without contrast 02/15/2021 Narrative & Impression  CLINICAL DATA:  Code stroke.  Visual disturbance.  Stroke suspected.  EXAM: CT HEAD WITHOUT CONTRAST  TECHNIQUE: Contiguous axial images were obtained from the base of the skull through the vertex without intravenous contrast.  COMPARISON:  MRI 05/27/2020  FINDINGS: Brain: Normal appearance by CT without evidence of atrophy, old or acute infarction, mass lesion, hemorrhage, hydrocephalus or extra-axial collection.  Vascular: There is atherosclerotic calcification of the major vessels at the base of the brain.  Skull: Negative  Sinuses/Orbits: Clear/normal  Other: None  ASPECTS (Alberta Stroke Program Early CT Score)  - Ganglionic level infarction (caudate, lentiform nuclei, internal capsule, insula, M1-M3 cortex): 7  - Supraganglionic infarction (M4-M6 cortex): 3  Total score (0-10 with 10 being normal): 10  IMPRESSION: 1. Normal head CT for age. Atherosclerotic calcification of the major vessels at the base of the brain. 2. ASPECTS is 10. 3.  These results were communicated to Dr. Selina Cooley At 5:00 pmon 3/20/2022by text page via the [facility] messaging system.         Echo 02/15/2021 1. Left ventricular ejection fraction, by estimation, is 60 to 65%. The  left ventricle has normal function. The left ventricle has no regional  wall motion abnormalities. There is moderate concentric left ventricular  hypertrophy. Left ventricular  diastolic parameters are indeterminate. Elevated left ventricular  end-diastolic pressure.  2. Right ventricular systolic function is normal. The right ventricular  size is normal. Tricuspid regurgitation signal is inadequate for assessing  PA pressure.  3. The mitral valve is normal in structure. Trivial mitral valve  regurgitation. No evidence of mitral stenosis.  4. The aortic valve is calcified. Aortic valve regurgitation is  mild.  Mild to moderate aortic valve sclerosis/calcification is present, without  any evidence of aortic stenosis. Focal dense calcification of the left  coornary cusp is present. Aortic  regurgitation PHT measures 543 msec.  5. The inferior vena cava is dilated in size with <50% respiratory  variability, suggesting right atrial pressure of 15 mmHg.    MRI of brain 02/16/2021 IMPRESSION: 1. No acute intracranial abnormality. 2. Chronic small vessel disease with minimal progression from an MRI last year.   Cath 02/17/2021 1. Widely patent coronary arteries with no obstructive CAD 2. Normal LV function  Suspect noncardiac symptoms Diagnostic Dominance: Right     Assessment and Plan:  No diagnosis found. 1. Essential hypertension Complaining of elevated blood pressures at home. States at home when she checks her blood pressure systolics are running anywhere from the 160s to 180s.  Blood pressure today on arrival is 142/84.  Recent ER presentation with elevated blood pressure and strokelike symptoms.  Increase benazepril to 40 mg daily.  Continue  chlorthalidone 50 mg daily.  Continue clonidine 0.2 mg 3 times daily.  Get basic metabolic panel and magnesium in 2 weeks prior to follow-up.  Start checking your blood pressures about 1 to 2 hours after taking antihypertensive medications.  Write them down and bring them with you at follow-up in 2 weeks.  2. Mixed hyperlipidemia Continue Crestor 40 mg daily.  Lipid panel 02/16/2021: Total cholesterol 148, triglycerides 42, HDL 51, LDL 89.  3. Tobacco user History of long-term smoking.  Patient states she is cutting way down on her smoking.  Encouraged cessation.  4. Morbid obesity (HCC) Advised dietary modifications and weight loss.  5.  TIA/CVA-like symptoms Recent presentation to the emergency room for chest pain with elevated blood pressure, visual disturbance.  Patient had multiple neuro studies as noted above.  No current CVA or TIA-like symptoms.  Continue aspirin 81 mg daily.    Medication Adjustments/Labs and Tests Ordered: Current medicines are reviewed at length with the patient today.  Concerns regarding medicines are outlined above.   Disposition: Follow-up with Dr. Wyline Mood or APP 2 weeks  Signed, Rennis Harding, NP 03/22/2021 6:03 PM    [facility] [address] [place], [place], [place] [address] Phone: [phone]; Fax: [phone]

## 2021-03-23 ENCOUNTER — Ambulatory Visit: Payer: Medicare HMO | Admitting: Family Medicine

## 2021-03-23 DIAGNOSIS — E782 Mixed hyperlipidemia: Secondary | ICD-10-CM

## 2021-03-23 DIAGNOSIS — Z720 Tobacco use: Secondary | ICD-10-CM

## 2021-03-23 DIAGNOSIS — I10 Essential (primary) hypertension: Secondary | ICD-10-CM

## 2021-03-23 DIAGNOSIS — G459 Transient cerebral ischemic attack, unspecified: Secondary | ICD-10-CM

## 2021-03-28 DIAGNOSIS — E1143 Type 2 diabetes mellitus with diabetic autonomic (poly)neuropathy: Secondary | ICD-10-CM | POA: Diagnosis not present

## 2021-03-28 DIAGNOSIS — I10 Essential (primary) hypertension: Secondary | ICD-10-CM | POA: Diagnosis not present

## 2021-03-29 NOTE — Progress Notes (Signed)
Cardiology Office Note  Date: 03/30/2021   ID: Amber Harris, DOB Apr 20, 1961, MRN [id number]  PCP:  Toma Deiters, MD  Cardiologist:  Dina Rich, MD Electrophysiologist:  None   Chief Complaint: Hospital follow up.  History of Present Illness: Amber Harris is a 60 y.o. female with a history of CP, bradycardia , DM2, HTN, obesity, tobacco user, HLD. ASD.  Longstanding hx of exertional syncope.   Recent presentation for CP at OSH. Transferred to [facility] for further workup 02/14/2021. Troponins were negative x 2. CT negative for PE, EKG SB with HR 46. Pain recurred and relieved with NTG.  CT neck with 50% RICA stenosis, 30% LICA stenosis. Underwent cardiac catheterization : widely patent coronary arteries. No obstructive CAD. CP felt to be non-cardiac in origin.   She was last here for follow-up status post recent hospitalization for chest pain and subsequent cardiac catheterization.  She denies any further issues with chest pain, shortness of breath, palpitations.  She did have an episode when she was at the hospital with a visual disturbance and had a CT of her head/neck.  CT of head was normal for age.  Atherosclerotic calcification of major vessels at the base of the brain.  CT angio of the head for stroke showed 50% stenosis of the right carotid siphon and 30% stenosis of the left carotid siphon.  Evidence of aortic atherosclerosis.  MRI of the brain showed no acute intracranial abnormality.  Chronic small vessel disease with minimal progression from an MRI the prior year.  States her blood pressure was elevated during that time.  Blood pressure on arrival was 142/84.  She stated her home blood pressure was consistently staying in the 160s to 180s systolic.  Current antihypertensive medications include; chlorthalidone 50 mg daily, clonidine 0.2 mg p.o. 3 times daily.  Lasix 40 mg daily.  Benazepril 20 mg daily.  She continued to smoke but but stated she was attempting to cut down.    She is here for follow-up today.  She brings with her a log of blood pressures which appear to be ranging anywhere from 150 systolic to 201.  Diastolic blood pressures are running anywhere from 68-96.  She states she recently fell and injured her leg and back.  She states she has cut down significantly on smoking.  She states her last pack of cigarettes lasted her approximately 3 weeks.  She denies any anginal or exertional symptoms, orthostatic symptoms, CVA or TIA-like symptoms, PND, orthopnea.  At last visit we increased dose of benazepril to 40 mg daily.  Blood pressure today on arrival is 146/82.  Past Medical History:  Diagnosis Date  . Aortic valve disease    Long-standing systolic murmur  . Asthma    Uses p.r.n. albuterol  . Bronchitis   . COPD (chronic obstructive pulmonary disease) (HCC)   . Degenerative joint disease    right knee  . Depression   . Depression with anxiety   . Diabetes mellitus   . Dyspnea    exertion  . Dyspnea on exertion    poor exercise tolerance  . Fibroids    uterine; postmenopausal bleeding  . GERD (gastroesophageal reflux disease)   . Headache(784.0)   . Hyperlipidemia   . Hypertension   . Myocardial infarction (HCC)   . Obesity   . Palpitations   . Stroke [facility])    TIA - left side residual  . Syncope    exertional  . Tobacco user  30 pack years; 04/2011: 1/4 pack per day during quick attempt    Past Surgical History:  Procedure Laterality Date  . ABDOMINAL HYSTERECTOMY  12/21/2011   Procedure: HYSTERECTOMY ABDOMINAL;  Surgeon: Tilda Burrow, MD;  Location: [facility];  Service: Gynecology;  Laterality: N/A;  . CATARACT EXTRACTION W/PHACO Left 10/03/2020   Procedure: CATARACT EXTRACTION PHACO AND INTRAOCULAR LENS PLACEMENT LEFT EYE;  Surgeon: Fabio Pierce, MD;  Location: [facility];  Service: Ophthalmology;  Laterality: Left;  CDE 5.04  . CATARACT EXTRACTION W/PHACO Right 10/17/2020   Procedure: CATARACT EXTRACTION PHACO AND INTRAOCULAR LENS  PLACEMENT RIGHT EYE;  Surgeon: Fabio Pierce, MD;  Location: [facility];  Service: Ophthalmology;  Laterality: Right;  CDE: 4.60  . LEFT HEART CATH AND CORONARY ANGIOGRAPHY N/A 02/17/2021   Procedure: LEFT HEART CATH AND CORONARY ANGIOGRAPHY;  Surgeon: Tonny Bollman, MD;  Location: [facility] INVASIVE CV LAB;  Service: Cardiovascular;  Laterality: N/A;  . TUBAL LIGATION      Current Outpatient Medications  Medication Sig Dispense Refill  . albuterol (PROVENTIL) (5 MG/ML) 0.5% nebulizer solution Take 2.5 mg by nebulization 3 (three) times daily as needed for wheezing or shortness of breath.     Marland Kitchen albuterol (PROVENTIL,VENTOLIN) 90 MCG/ACT inhaler Inhale 2 puffs into the lungs every 6 (six) hours as needed for wheezing or shortness of breath.     Marland Kitchen amLODipine (NORVASC) 5 MG tablet Take 1 tablet (5 mg total) by mouth daily. 90 tablet 1  . aspirin EC 81 MG tablet Take 81 mg by mouth daily.    . benazepril (LOTENSIN) 40 MG tablet Take 1 tablet (40 mg total) by mouth daily. 90 tablet 3  . chlorthalidone (HYGROTON) 50 MG tablet Take 50 mg by mouth daily.    . cloNIDine (CATAPRES) 0.2 MG tablet Take 1 tablet (0.2 mg total) by mouth 3 (three) times daily. 90 tablet 2  . estradiol (ESTRACE) 1 MG tablet Take 1 mg by mouth daily.    . Fluticasone-Umeclidin-Vilant (TRELEGY ELLIPTA) 100-62.5-25 MCG/INH AEPB Inhale 1 puff into the lungs daily.    . furosemide (LASIX) 40 MG tablet Take 1 tablet (40 mg total) by mouth daily. 30 tablet 2  . insulin aspart (NOVOLOG) 100 UNIT/ML injection Inject 5 Units into the skin 3 (three) times daily after meals.    . insulin degludec (TRESIBA FLEXTOUCH) 100 UNIT/ML FlexTouch Pen Inject 20 Units into the skin at bedtime.    Marland Kitchen JANUMET 50-500 MG tablet Take 1 tablet by mouth 2 (two) times daily.    Marland Kitchen JARDIANCE 25 MG TABS tablet Take 25 mg by mouth daily.     . methocarbamol (ROBAXIN) 500 MG tablet Take 500 mg by mouth at bedtime.    . Multiple Vitamins-Minerals (MULTIVITAMIN ADULT) CHEW  Chew 1 tablet by mouth daily.    . risperiDONE (RISPERDAL) 0.5 MG tablet Take 0.5 mg by mouth at bedtime.    . rosuvastatin (CRESTOR) 40 MG tablet Take 1 tablet (40 mg total) by mouth daily. 30 tablet 3  . topiramate (TOPAMAX) 50 MG tablet Take 50 mg by mouth 2 (two) times daily as needed (headaches).     No current facility-administered medications for this visit.   Allergies:  Aspirin and Penicillins   Social History: The patient  reports that she has been smoking cigarettes. She started smoking about 46 years ago. She has a 15.00 pack-year smoking history. She has never used smokeless tobacco. She reports current drug use. Drug: Marijuana. She reports that she does not  drink alcohol.   Family History: The patient's family history includes Breast cancer in her sister; Cancer in her cousin; Heart disease in her brother and maternal aunt; Lung cancer in her mother.   ROS:  Please see the history of present illness. Otherwise, complete review of systems is positive for none.  All other systems are reviewed and negative.   Physical Exam: VS:  BP (!) 146/82   Pulse (!) 54   Ht 5\' 9"  (1.753 m)   Wt 258 lb (117 kg)   LMP 10/25/2011   SpO2 99%   BMI 38.10 kg/m , BMI Body mass index is 38.1 kg/m.  Wt Readings from Last 3 Encounters:  03/30/21 258 lb (117 kg)  03/09/21 253 lb 3.2 oz (114.9 kg)  02/17/21 248 lb 8 oz (112.7 kg)    General: Patient appears comfortable at rest. Neck: Supple, no elevated JVP positive carotid bruits right side, no thyromegaly. Lungs: Clear to auscultation, nonlabored breathing at rest. Cardiac: Regular rate and rhythm, no S3 or significant systolic murmur, no pericardial rub. Extremities: No pitting edema, distal pulses 2+. Skin: Warm and dry. Musculoskeletal: No kyphosis. Neuropsychiatric: Alert and oriented x3, affect grossly appropriate.  ECG:  EKG February 16, 2021 sinus bradycardia with a rate of 51.  Recent Labwork: 02/14/2021: ALT 26; AST 28; B  Natriuretic Peptide 70.1; Magnesium 2.1 02/15/2021: TSH 0.544 02/17/2021: BUN 22; Creatinine, Ser 1.20; Hemoglobin 12.5; Platelets 146; Potassium 3.1; Sodium 137     Component Value Date/Time   CHOL 148 02/16/2021 0419   TRIG 42 02/16/2021 0419   HDL 51 02/16/2021 0419   CHOLHDL 2.9 02/16/2021 0419   VLDL 8 02/16/2021 0419   LDLCALC 89 02/16/2021 0419    Other Studies Reviewed Today:   MRI of the brain without contrast 02/16/2021 Study Result  Narrative & Impression  CLINICAL DATA:  60 year old female code stroke presentation with unrevealing CTA, CTP.  EXAM: MRI HEAD WITHOUT CONTRAST  TECHNIQUE: Multiplanar, multiecho pulse sequences of the brain and surrounding structures were obtained without intravenous contrast.  COMPARISON:  CTA, CT head and CTP yesterday. [facility] Brain MRI 05/27/2020, 10/27/2014.  FINDINGS: Brain: No restricted diffusion or evidence of acute infarction. Chronic prominent lacunar infarct of the right pons again noted. Small area of focal encephalomalacia in the body of the corpus callosum is stable.  No midline shift, mass effect, evidence of mass lesion, ventriculomegaly, extra-axial collection or acute intracranial hemorrhage. Cervicomedullary junction within normal limits. Chronic partially empty sella.  Mild T2 hyperintensity in the left basal ganglia has progressed since last year. Otherwise stable gray and white matter signal throughout the brain. No cortical encephalomalacia or chronic cerebral blood products identified.  Vascular: Major intracranial vascular flow voids are stable since last year.  Skull and upper cervical spine: Bone marrow signal is stable since 2015. Partially visible cervical spine disc degeneration.  Sinuses/Orbits: Postoperative changes to both globes since last year. Orbits otherwise appears stable, negative.  Paranasal sinuses and mastoids are stable and well pneumatized.  Other:  Grossly normal visible internal auditory structures. Visible scalp and face appear negative.  IMPRESSION: 1. No acute intracranial abnormality. 2. Chronic small vessel disease with minimal progression from an MRI last year.      CT angiogram of head and neck.  02/15/2021 CLINICAL DATA:  Visual disturbance. Acute stroke suspected. Negative head CT.  EXAM: CT ANGIOGRAPHY HEAD AND NECK  CT PERFUSION BRAIN  TECHNIQUE: Multidetector CT imaging of the head and neck was performed using  the standard protocol during bolus administration of intravenous contrast. Multiplanar CT image reconstructions and MIPs were obtained to evaluate the vascular anatomy. Carotid stenosis measurements (when applicable) are obtained utilizing NASCET criteria, using the distal internal carotid diameter as the denominator.  Multiphase CT imaging of the brain was performed following IV bolus contrast injection. Subsequent parametric perfusion maps were calculated using RAPID software.  CONTRAST:  OMNIPAQUE IOHEXOL 350 MG/ML SOLN  COMPARISON:  Head CT earlier same day.  FINDINGS: CTA NECK FINDINGS  Aortic arch: Aortic atherosclerotic calcification. Branching pattern is normal without origin stenosis.  Right carotid system: Common carotid artery widely patent to the bifurcation. No evidence of carotid bifurcation soft or calcified plaque. No stenosis. Some motion degradation.  Left carotid system: Common carotid artery widely patent to the bifurcation. No evidence of bifurcation soft or calcified plaque. No stenosis. Some motion degradation.  Vertebral arteries: Both vertebral artery origins are widely patent. Both vertebral arteries are patent through the cervical region. Motion degradation.  Skeleton: Ordinary cervical spondylosis.  Other neck: No mass or adenopathy.  Negative  Upper chest: Negative  Review of the MIP images confirms the above findings  CTA HEAD  FINDINGS  Anterior circulation: Both internal carotid arteries are patent through the skull base and siphon regions. There is siphon atherosclerotic calcification with stenosis estimated at 50% on the right and 30% on the left. The anterior and middle cerebral vessels are patent. No large or medium vessel occlusion. No aneurysm or vascular malformation.  Posterior circulation: Both vertebral arteries are patent to the basilar. No basilar stenosis. Superior cerebellar and posterior cerebral arteries show flow.  Venous sinuses: Patent and normal.  Anatomic variants: None significant.  Review of the MIP images confirms the above findings  CT Brain Perfusion Findings:  ASPECTS: 10  CBF (<30%) Volume: 0mL  Perfusion (Tmax>6.0s) volume: 0mL  Mismatch Volume: 0mL  Infarction Location:None  IMPRESSION: 1. Some motion degradation. No large or medium vessel occlusion. Negative perfusion study. 2. Aortic atherosclerosis. 3. No carotid bifurcation stenosis. 50% stenosis of the right carotid siphon. 30% stenosis of the left carotid siphon.  Aortic Atherosclerosis (ICD10-I70.0).   CT of the head without contrast 02/15/2021 Narrative & Impression  CLINICAL DATA:  Code stroke.  Visual disturbance.  Stroke suspected.  EXAM: CT HEAD WITHOUT CONTRAST  TECHNIQUE: Contiguous axial images were obtained from the base of the skull through the vertex without intravenous contrast.  COMPARISON:  MRI 05/27/2020  FINDINGS: Brain: Normal appearance by CT without evidence of atrophy, old or acute infarction, mass lesion, hemorrhage, hydrocephalus or extra-axial collection.  Vascular: There is atherosclerotic calcification of the major vessels at the base of the brain.  Skull: Negative  Sinuses/Orbits: Clear/normal  Other: None  ASPECTS (Alberta Stroke Program Early CT Score)  - Ganglionic level infarction (caudate, lentiform nuclei, internal capsule,  insula, M1-M3 cortex): 7  - Supraganglionic infarction (M4-M6 cortex): 3  Total score (0-10 with 10 being normal): 10  IMPRESSION: 1. Normal head CT for age. Atherosclerotic calcification of the major vessels at the base of the brain. 2. ASPECTS is 10. 3. These results were communicated to Dr. Selina Cooley At 5:00 pmon 3/20/2022by text page via the [facility] messaging system.         Echo 02/15/2021 1. Left ventricular ejection fraction, by estimation, is 60 to 65%. The  left ventricle has normal function. The left ventricle has no regional  wall motion abnormalities. There is moderate concentric left ventricular  hypertrophy. Left ventricular  diastolic parameters  are indeterminate. Elevated left ventricular  end-diastolic pressure.  2. Right ventricular systolic function is normal. The right ventricular  size is normal. Tricuspid regurgitation signal is inadequate for assessing  PA pressure.  3. The mitral valve is normal in structure. Trivial mitral valve  regurgitation. No evidence of mitral stenosis.  4. The aortic valve is calcified. Aortic valve regurgitation is mild.  Mild to moderate aortic valve sclerosis/calcification is present, without  any evidence of aortic stenosis. Focal dense calcification of the left  coornary cusp is present. Aortic  regurgitation PHT measures 543 msec.  5. The inferior vena cava is dilated in size with <50% respiratory  variability, suggesting right atrial pressure of 15 mmHg.    MRI of brain 02/16/2021 IMPRESSION: 1. No acute intracranial abnormality. 2. Chronic small vessel disease with minimal progression from an MRI last year.   Cath 02/17/2021 1. Widely patent coronary arteries with no obstructive CAD 2. Normal LV function  Suspect noncardiac symptoms Diagnostic Dominance: Right     Assessment and Plan:  1. Essential hypertension   2. Mixed hyperlipidemia   3. Tobacco user   4. TIA (transient ischemic attack)    5. Morbid obesity (HCC)    1. Essential hypertension At last visit we increased benazepril to 40 mg daily.  She brings with her a log of blood pressures from April 26 through May 1.  Systolic blood pressures are running from 150-201.  Diastolic blood pressure ranging from 79-96.  Continue benazepril  40 mg daily.  Continue chlorthalidone 50 mg daily.  Continue clonidine 0.2 mg 3 times daily.  Add amlodipine 5 mg p.o. daily to current regimen.  Measure of blood pressures each week and bring a log with you to next visit.  If blood pressures are sustained at 130/80 or above in spite of recent dosage increases and antihypertensive additions we may need to refer to hypertension clinic.  We discussed this today at visit.  2. Mixed hyperlipidemia Continue Crestor 40 mg daily.  Lipid panel 02/16/2021: Total cholesterol 148, triglycerides 42, HDL 51, LDL 89.  3. Tobacco user History of long-term smoking.  Patient states she is cutting way down on her smoking.  States recent pack lasted her 3 weeks.  Encouraged cessation.  4. Morbid obesity (HCC) Advised dietary modifications and weight loss.  5.  TIA/CVA-like symptoms Recent presentation to the emergency room for chest pain with elevated blood pressure, visual disturbance.  Patient had multiple neuro studies as noted above.  No current CVA or TIA-like symptoms.  Continue aspirin 81 mg daily.    Medication Adjustments/Labs and Tests Ordered: Current medicines are reviewed at length with the patient today.  Concerns regarding medicines are outlined above.   Disposition: Follow-up with Dr. Wyline Mood or APP 1 month  Signed, Rennis Harding, NP 03/30/2021 11:09 AM    [facility] at [facility] [address] [place], [place], [place] [address] Phone: [phone]; Fax: [phone]

## 2021-03-30 ENCOUNTER — Encounter: Payer: Self-pay | Admitting: Family Medicine

## 2021-03-30 ENCOUNTER — Other Ambulatory Visit: Payer: Self-pay

## 2021-03-30 ENCOUNTER — Ambulatory Visit (INDEPENDENT_AMBULATORY_CARE_PROVIDER_SITE_OTHER): Payer: Medicare HMO | Admitting: Family Medicine

## 2021-03-30 VITALS — BP 146/82 | HR 54 | Ht 69.0 in | Wt 258.0 lb

## 2021-03-30 DIAGNOSIS — E782 Mixed hyperlipidemia: Secondary | ICD-10-CM | POA: Diagnosis not present

## 2021-03-30 DIAGNOSIS — Z720 Tobacco use: Secondary | ICD-10-CM

## 2021-03-30 DIAGNOSIS — I10 Essential (primary) hypertension: Secondary | ICD-10-CM | POA: Diagnosis not present

## 2021-03-30 DIAGNOSIS — G459 Transient cerebral ischemic attack, unspecified: Secondary | ICD-10-CM | POA: Diagnosis not present

## 2021-03-30 MED ORDER — AMLODIPINE BESYLATE 5 MG PO TABS: 5.0000 mg | ORAL_TABLET | Freq: Every day | ORAL | 1 refills | Status: DC

## 2021-03-30 NOTE — Patient Instructions (Signed)
Your physician recommends that you schedule a follow-up appointment in: [place], NP  Your physician has recommended you make the following change in your medication:   START AMLODIPINE 5 MG DAILY   Thank you for choosing [facility]!!

## 2021-03-31 ENCOUNTER — Telehealth: Payer: Self-pay | Admitting: *Deleted

## 2021-03-31 NOTE — Telephone Encounter (Signed)
Laurine Blazer, LPN  05/01/6947 5:46 PM EDT Back to Top     Notified, copy to pcp.    Laurine Blazer, LPN  2/70/3500 9:38 PM EDT      FULL Dorna Leitz   Verta Ellen., NP  03/24/2021 11:55 AM EDT      Please call the patient and let her know the lab work looks good. Calcium was slightly low but nothing significant.

## 2021-04-02 DIAGNOSIS — Z6837 Body mass index (BMI) 37.0-37.9, adult: Secondary | ICD-10-CM | POA: Diagnosis not present

## 2021-04-02 DIAGNOSIS — Z0000 Encounter for general adult medical examination without abnormal findings: Secondary | ICD-10-CM | POA: Diagnosis not present

## 2021-04-02 DIAGNOSIS — Z8673 Personal history of transient ischemic attack (TIA), and cerebral infarction without residual deficits: Secondary | ICD-10-CM | POA: Diagnosis not present

## 2021-04-02 DIAGNOSIS — I10 Essential (primary) hypertension: Secondary | ICD-10-CM | POA: Diagnosis not present

## 2021-04-02 DIAGNOSIS — I2510 Atherosclerotic heart disease of native coronary artery without angina pectoris: Secondary | ICD-10-CM | POA: Diagnosis not present

## 2021-04-02 DIAGNOSIS — E1143 Type 2 diabetes mellitus with diabetic autonomic (poly)neuropathy: Secondary | ICD-10-CM | POA: Diagnosis not present

## 2021-04-19 DIAGNOSIS — R42 Dizziness and giddiness: Secondary | ICD-10-CM | POA: Diagnosis not present

## 2021-04-19 DIAGNOSIS — W57XXXA Bitten or stung by nonvenomous insect and other nonvenomous arthropods, initial encounter: Secondary | ICD-10-CM | POA: Diagnosis not present

## 2021-04-19 DIAGNOSIS — I10 Essential (primary) hypertension: Secondary | ICD-10-CM | POA: Diagnosis not present

## 2021-04-19 DIAGNOSIS — M542 Cervicalgia: Secondary | ICD-10-CM | POA: Diagnosis not present

## 2021-04-19 DIAGNOSIS — R9431 Abnormal electrocardiogram [ECG] [EKG]: Secondary | ICD-10-CM | POA: Diagnosis not present

## 2021-04-19 DIAGNOSIS — S0096XA Insect bite (nonvenomous) of unspecified part of head, initial encounter: Secondary | ICD-10-CM | POA: Diagnosis not present

## 2021-04-19 DIAGNOSIS — S1096XA Insect bite of unspecified part of neck, initial encounter: Secondary | ICD-10-CM | POA: Diagnosis not present

## 2021-04-19 DIAGNOSIS — R590 Localized enlarged lymph nodes: Secondary | ICD-10-CM | POA: Diagnosis not present

## 2021-04-19 DIAGNOSIS — E119 Type 2 diabetes mellitus without complications: Secondary | ICD-10-CM | POA: Diagnosis not present

## 2021-04-19 DIAGNOSIS — J449 Chronic obstructive pulmonary disease, unspecified: Secondary | ICD-10-CM | POA: Diagnosis not present

## 2021-04-19 DIAGNOSIS — F209 Schizophrenia, unspecified: Secondary | ICD-10-CM | POA: Diagnosis not present

## 2021-04-27 DIAGNOSIS — I10 Essential (primary) hypertension: Secondary | ICD-10-CM | POA: Diagnosis not present

## 2021-04-27 DIAGNOSIS — E1143 Type 2 diabetes mellitus with diabetic autonomic (poly)neuropathy: Secondary | ICD-10-CM | POA: Diagnosis not present

## 2021-04-27 DIAGNOSIS — I2510 Atherosclerotic heart disease of native coronary artery without angina pectoris: Secondary | ICD-10-CM | POA: Diagnosis not present

## 2021-04-30 ENCOUNTER — Ambulatory Visit: Payer: Medicare HMO | Admitting: Family Medicine

## 2021-05-13 NOTE — Progress Notes (Signed)
Cardiology Office Note  Date: 05/14/2021   ID: RONNETT BEAUFORT, DOB 08/07/1961, MRN [id number]  PCP:  Toma Deiters, MD  Cardiologist:  Dina Rich, MD Electrophysiologist:  None   Chief Complaint: 1 month follow-up  History of Present Illness: Amber Harris is a 60 y.o. female with a history of CP, bradycardia , DM2, HTN, obesity, tobacco user, HLD. ASD.  Longstanding hx of exertional syncope.   Recent presentation for CP at OSH. Transferred to [facility] for further workup 02/14/2021. Troponins were negative x 2. CT negative for PE, EKG SB with HR 46. Pain recurred and relieved with NTG.  CT neck with 50% RICA stenosis, 30% LICA stenosis. Underwent cardiac catheterization : widely patent coronary arteries. No obstructive CAD. CP felt to be non-cardiac in origin.   Previously here for follow-up status post recent hospitalization for chest pain and subsequent cardiac catheterization.  She denies any further issues with chest pain, shortness of breath, palpitations.  She did have an episode when she was at the hospital with a visual disturbance and had a CT of her head/neck.  CT of head was normal for age.  Atherosclerotic calcification of major vessels at the base of the brain.  CT angio of the head for stroke showed 50% stenosis of the right carotid siphon and 30% stenosis of the left carotid siphon.  Evidence of aortic atherosclerosis.  MRI of the brain showed no acute intracranial abnormality.  Chronic small vessel disease with minimal progression from an MRI the prior year.  States her blood pressure was elevated during that time.  Blood pressure on arrival was 142/84.  She stated her home blood pressure was consistently staying in the 160s to 180s systolic.  Current antihypertensive medications include; chlorthalidone 50 mg daily, clonidine 0.2 mg p.o. 3 times daily.  Lasix 40 mg daily.  Benazepril 20 mg daily.  She continued to smoke but but stated she was attempting to cut down.   She  was last here for follow-up and brought with her a log of blood pressures which appeared to be ranging anywhere from 150 systolic to 201.  Diastolic blood pressures were running anywhere from 68-96.  Stated she recently fell and injured her leg and back.  She had cut down significantly on smoking.  Her last pack of cigarettes lasted her approximately 3 weeks.  She denied any anginal or exertional symptoms, orthostatic symptoms, CVA or TIA-like symptoms, PND, orthopnea.  At last visit we increased dose of benazepril to 40 mg daily.  Blood pressure was 146/82.  She is here for 1 month follow-up after increasing benazepril to help with better blood pressure control.  Blood pressure today is 142/76.  She continues to smoke.  She states she has cut way down on the amount of cigarettes she smokes but has not completely stopped.  States she is having some issues with some dyspnea due to the hot humid weather.  Otherwise she denies any anginal symptoms, orthostatic symptoms, CVA or TIA-like symptoms, palpitations arrhythmias, PND, orthopnea.  Denies any claudication-like symptoms, DVT or PE-like symptoms, or lower extremity edema.  Denies any bleeding issues.  Past Medical History:  Diagnosis Date   Aortic valve disease    Long-standing systolic murmur   Asthma    Uses p.r.n. albuterol   Bronchitis    COPD (chronic obstructive pulmonary disease) (HCC)    Degenerative joint disease    right knee   Depression    Depression with anxiety  Diabetes mellitus    Dyspnea    exertion   Dyspnea on exertion    poor exercise tolerance   Fibroids    uterine; postmenopausal bleeding   GERD (gastroesophageal reflux disease)    Headache(784.0)    Hyperlipidemia    Hypertension    Myocardial infarction (HCC)    Obesity    Palpitations    Stroke [facility])    TIA - left side residual   Syncope    exertional   Tobacco user    30 pack years; 04/2011: 1/4 pack per day during quick attempt    Past Surgical  History:  Procedure Laterality Date   ABDOMINAL HYSTERECTOMY  12/21/2011   Procedure: HYSTERECTOMY ABDOMINAL;  Surgeon: Tilda Burrow, MD;  Location: [facility];  Service: Gynecology;  Laterality: N/A;   CATARACT EXTRACTION W/PHACO Left 10/03/2020   Procedure: CATARACT EXTRACTION PHACO AND INTRAOCULAR LENS PLACEMENT LEFT EYE;  Surgeon: Fabio Pierce, MD;  Location: [facility];  Service: Ophthalmology;  Laterality: Left;  CDE 5.04   CATARACT EXTRACTION W/PHACO Right 10/17/2020   Procedure: CATARACT EXTRACTION PHACO AND INTRAOCULAR LENS PLACEMENT RIGHT EYE;  Surgeon: Fabio Pierce, MD;  Location: [facility];  Service: Ophthalmology;  Laterality: Right;  CDE: 4.60   LEFT HEART CATH AND CORONARY ANGIOGRAPHY N/A 02/17/2021   Procedure: LEFT HEART CATH AND CORONARY ANGIOGRAPHY;  Surgeon: Tonny Bollman, MD;  Location: [facility] INVASIVE CV LAB;  Service: Cardiovascular;  Laterality: N/A;   TUBAL LIGATION      Current Outpatient Medications  Medication Sig Dispense Refill   albuterol (PROVENTIL) (5 MG/ML) 0.5% nebulizer solution Take 2.5 mg by nebulization 3 (three) times daily as needed for wheezing or shortness of breath.      albuterol (PROVENTIL,VENTOLIN) 90 MCG/ACT inhaler Inhale 2 puffs into the lungs every 6 (six) hours as needed for wheezing or shortness of breath.      amLODipine (NORVASC) 10 MG tablet Take 1 tablet (10 mg total) by mouth daily. 90 tablet 2   aspirin EC 81 MG tablet Take 81 mg by mouth daily.     benazepril (LOTENSIN) 40 MG tablet Take 1 tablet (40 mg total) by mouth daily. 90 tablet 3   chlorthalidone (HYGROTON) 50 MG tablet Take 50 mg by mouth daily.     cloNIDine (CATAPRES) 0.2 MG tablet Take 1 tablet (0.2 mg total) by mouth 3 (three) times daily. 90 tablet 2   estradiol (ESTRACE) 1 MG tablet Take 1 mg by mouth daily.     Fluticasone-Umeclidin-Vilant (TRELEGY ELLIPTA) 100-62.5-25 MCG/INH AEPB Inhale 1 puff into the lungs daily.     furosemide (LASIX) 40 MG tablet Take 1 tablet (40 mg  total) by mouth daily. 30 tablet 2   insulin aspart (NOVOLOG) 100 UNIT/ML injection Inject 5 Units into the skin 3 (three) times daily after meals.     insulin degludec (TRESIBA FLEXTOUCH) 100 UNIT/ML FlexTouch Pen Inject 20 Units into the skin at bedtime.     JANUMET 50-500 MG tablet Take 1 tablet by mouth 2 (two) times daily.     JARDIANCE 25 MG TABS tablet Take 25 mg by mouth daily.      methocarbamol (ROBAXIN) 500 MG tablet Take 500 mg by mouth at bedtime.     Multiple Vitamins-Minerals (MULTIVITAMIN ADULT) CHEW Chew 1 tablet by mouth daily.     risperiDONE (RISPERDAL) 0.5 MG tablet Take 0.5 mg by mouth at bedtime.     rosuvastatin (CRESTOR) 40 MG tablet Take 1 tablet (40 mg total) by  mouth daily. 30 tablet 3   topiramate (TOPAMAX) 50 MG tablet Take 50 mg by mouth 2 (two) times daily as needed (headaches).     No current facility-administered medications for this visit.   Allergies:  Aspirin and Penicillins   Social History: The patient  reports that she has been smoking cigarettes. She started smoking about 46 years ago. She has a 15.00 pack-year smoking history. She has never used smokeless tobacco. She reports current drug use. Drug: Marijuana. She reports that she does not drink alcohol.   Family History: The patient's family history includes Breast cancer in her sister; Cancer in her cousin; Heart disease in her brother and maternal aunt; Lung cancer in her mother.   ROS:  Please see the history of present illness. Otherwise, complete review of systems is positive for none.  All other systems are reviewed and negative.   Physical Exam: VS:  BP (!) 142/76   Pulse 68   Ht 5\' 9"  (1.753 m)   Wt 254 lb 12.8 oz (115.6 kg)   LMP 10/25/2011   SpO2 95%   BMI 37.63 kg/m , BMI Body mass index is 37.63 kg/m.  Wt Readings from Last 3 Encounters:  05/14/21 254 lb 12.8 oz (115.6 kg)  03/30/21 258 lb (117 kg)  03/09/21 253 lb 3.2 oz (114.9 kg)    General: Obese patient appears  comfortable at rest. Neck: Supple, no elevated JVP positive carotid bruits right side, no thyromegaly. Lungs: Clear to auscultation, nonlabored breathing at rest. Cardiac: Regular rate and rhythm, no S3 or significant systolic murmur, no pericardial rub. Extremities: No pitting edema, distal pulses 2+. Skin: Warm and dry. Musculoskeletal: No kyphosis. Neuropsychiatric: Alert and oriented x3, affect grossly appropriate.  ECG:  EKG February 16, 2021 sinus bradycardia with a rate of 51.  Recent Labwork: 02/14/2021: ALT 26; AST 28; B Natriuretic Peptide 70.1; Magnesium 2.1 02/15/2021: TSH 0.544 02/17/2021: BUN 22; Creatinine, Ser 1.20; Hemoglobin 12.5; Platelets 146; Potassium 3.1; Sodium 137     Component Value Date/Time   CHOL 148 02/16/2021 0419   TRIG 42 02/16/2021 0419   HDL 51 02/16/2021 0419   CHOLHDL 2.9 02/16/2021 0419   VLDL 8 02/16/2021 0419   LDLCALC 89 02/16/2021 0419    Other Studies Reviewed Today:   MRI of the brain without contrast 02/16/2021 Study Result  Narrative & Impression  CLINICAL DATA:  60 year old female code stroke presentation with unrevealing CTA, CTP.   EXAM: MRI HEAD WITHOUT CONTRAST   TECHNIQUE: Multiplanar, multiecho pulse sequences of the brain and surrounding structures were obtained without intravenous contrast.   COMPARISON:  CTA, CT head and CTP yesterday. [facility] Brain MRI 05/27/2020, 10/27/2014.   FINDINGS: Brain: No restricted diffusion or evidence of acute infarction. Chronic prominent lacunar infarct of the right pons again noted. Small area of focal encephalomalacia in the body of the corpus callosum is stable.   No midline shift, mass effect, evidence of mass lesion, ventriculomegaly, extra-axial collection or acute intracranial hemorrhage. Cervicomedullary junction within normal limits. Chronic partially empty sella.   Mild T2 hyperintensity in the left basal ganglia has progressed since last year. Otherwise  stable gray and white matter signal throughout the brain. No cortical encephalomalacia or chronic cerebral blood products identified.   Vascular: Major intracranial vascular flow voids are stable since last year.   Skull and upper cervical spine: Bone marrow signal is stable since 2015. Partially visible cervical spine disc degeneration.   Sinuses/Orbits: Postoperative changes to both globes  since last year. Orbits otherwise appears stable, negative.   Paranasal sinuses and mastoids are stable and well pneumatized.   Other: Grossly normal visible internal auditory structures. Visible scalp and face appear negative.   IMPRESSION: 1. No acute intracranial abnormality. 2. Chronic small vessel disease with minimal progression from an MRI last year.       CT angiogram of head and neck.  02/15/2021 CLINICAL DATA:  Visual disturbance. Acute stroke suspected. Negative head CT.   EXAM: CT ANGIOGRAPHY HEAD AND NECK   CT PERFUSION BRAIN   TECHNIQUE: Multidetector CT imaging of the head and neck was performed using the standard protocol during bolus administration of intravenous contrast. Multiplanar CT image reconstructions and MIPs were obtained to evaluate the vascular anatomy. Carotid stenosis measurements (when applicable) are obtained utilizing NASCET criteria, using the distal internal carotid diameter as the denominator.   Multiphase CT imaging of the brain was performed following IV bolus contrast injection. Subsequent parametric perfusion maps were calculated using RAPID software.   CONTRAST:  OMNIPAQUE IOHEXOL 350 MG/ML SOLN   COMPARISON:  Head CT earlier same day.   FINDINGS: CTA NECK FINDINGS   Aortic arch: Aortic atherosclerotic calcification. Branching pattern is normal without origin stenosis.   Right carotid system: Common carotid artery widely patent to the bifurcation. No evidence of carotid bifurcation soft or calcified plaque. No stenosis.  Some motion degradation.   Left carotid system: Common carotid artery widely patent to the bifurcation. No evidence of bifurcation soft or calcified plaque. No stenosis. Some motion degradation.   Vertebral arteries: Both vertebral artery origins are widely patent. Both vertebral arteries are patent through the cervical region. Motion degradation.   Skeleton: Ordinary cervical spondylosis.   Other neck: No mass or adenopathy.  Negative   Upper chest: Negative   Review of the MIP images confirms the above findings   CTA HEAD FINDINGS   Anterior circulation: Both internal carotid arteries are patent through the skull base and siphon regions. There is siphon atherosclerotic calcification with stenosis estimated at 50% on the right and 30% on the left. The anterior and middle cerebral vessels are patent. No large or medium vessel occlusion. No aneurysm or vascular malformation.   Posterior circulation: Both vertebral arteries are patent to the basilar. No basilar stenosis. Superior cerebellar and posterior cerebral arteries show flow.   Venous sinuses: Patent and normal.   Anatomic variants: None significant.   Review of the MIP images confirms the above findings   CT Brain Perfusion Findings:   ASPECTS: 10   CBF (<30%) Volume: 0mL   Perfusion (Tmax>6.0s) volume: 0mL   Mismatch Volume: 0mL   Infarction Location:None   IMPRESSION: 1. Some motion degradation. No large or medium vessel occlusion. Negative perfusion study. 2. Aortic atherosclerosis. 3. No carotid bifurcation stenosis. 50% stenosis of the right carotid siphon. 30% stenosis of the left carotid siphon.   Aortic Atherosclerosis (ICD10-I70.0).    CT of the head without contrast 02/15/2021 Narrative & Impression  CLINICAL DATA:  Code stroke.  Visual disturbance.  Stroke suspected.   EXAM: CT HEAD WITHOUT CONTRAST   TECHNIQUE: Contiguous axial images were obtained from the base of the skull through  the vertex without intravenous contrast.   COMPARISON:  MRI 05/27/2020   FINDINGS: Brain: Normal appearance by CT without evidence of atrophy, old or acute infarction, mass lesion, hemorrhage, hydrocephalus or extra-axial collection.   Vascular: There is atherosclerotic calcification of the major vessels at the base of the brain.  Skull: Negative   Sinuses/Orbits: Clear/normal   Other: None   ASPECTS (Alberta Stroke Program Early CT Score)   - Ganglionic level infarction (caudate, lentiform nuclei, internal capsule, insula, M1-M3 cortex): 7   - Supraganglionic infarction (M4-M6 cortex): 3   Total score (0-10 with 10 being normal): 10   IMPRESSION: 1. Normal head CT for age. Atherosclerotic calcification of the major vessels at the base of the brain. 2. ASPECTS is 10. 3. These results were communicated to Dr. Selina Cooley At 5:00 pmon 3/20/2022by text page via the [facility] messaging system.       Echo 02/15/2021  1. Left ventricular ejection fraction, by estimation, is 60 to 65%. The  left ventricle has normal function. The left ventricle has no regional  wall motion abnormalities. There is moderate concentric left ventricular  hypertrophy. Left ventricular  diastolic parameters are indeterminate. Elevated left ventricular  end-diastolic pressure.   2. Right ventricular systolic function is normal. The right ventricular  size is normal. Tricuspid regurgitation signal is inadequate for assessing  PA pressure.   3. The mitral valve is normal in structure. Trivial mitral valve  regurgitation. No evidence of mitral stenosis.   4. The aortic valve is calcified. Aortic valve regurgitation is mild.  Mild to moderate aortic valve sclerosis/calcification is present, without  any evidence of aortic stenosis. Focal dense calcification of the left  coornary cusp is present. Aortic  regurgitation PHT measures 543 msec.   5. The inferior vena cava is dilated in size with <50% respiratory   variability, suggesting right atrial pressure of 15 mmHg.      MRI of brain 02/16/2021 IMPRESSION: 1. No acute intracranial abnormality. 2. Chronic small vessel disease with minimal progression from an MRI last year.     Cath 02/17/2021 1. Widely patent coronary arteries with no obstructive CAD 2. Normal LV function   Suspect noncardiac symptoms Diagnostic Dominance: Right       Assessment and Plan:  1. Essential hypertension   2. Mixed hyperlipidemia   3. Morbid obesity (HCC)   4. TIA (transient ischemic attack)     1. Essential hypertension At last visit we increased benazepril to 40 mg daily.  She brings with her a log of blood pressures from April 26 through May 1.  Systolic blood pressures are running from 150-201.  Diastolic blood pressure ranging from 79-96.  Continue benazepril  40 mg daily.  Continue chlorthalidone 50 mg daily.  Continue clonidine 0.2 mg 3 times daily.  At last visit we added amlodipine 5 mg mg p.o. daily to current regimen.  Her blood pressure continues to be elevated with BP today 142/76.  We will increase amlodipine to 10 mg daily.  Advised to continue to monitor blood pressures.  If blood pressures are sustained at 130/80 or above in spite of recent dosage increases and antihypertensive additions we may need to refer to hypertension clinic.    2. Mixed hyperlipidemia Continue Crestor 40 mg daily.  Lipid panel 02/16/2021: Total cholesterol 148, triglycerides 42, HDL 51, LDL 89.  3. Tobacco user History of long-term smoking.  At last visit she stated she was cutting way down on her smoking.  States recent pack lasted her 3 weeks.  Encouraged cessation.  Today she states she has not stopped smoking but has cut way down.  Reinforced cessation.  4. Morbid obesity (HCC) Advised dietary modifications and weight loss.  Patient has lost approximately 4 pounds since last visit on 03/30/2021  5.  TIA/CVA-like symptoms Recent  presentation to the emergency room  for chest pain with elevated blood pressure, visual disturbance.  Patient had multiple neuro studies as noted above.  No current CVA or TIA-like symptoms.  Continue aspirin 81 mg daily.  No new neurological symptoms per patient at today's visit.    Medication Adjustments/Labs and Tests Ordered: Current medicines are reviewed at length with the patient today.  Concerns regarding medicines are outlined above.   Disposition: Follow-up with Dr. Wyline Mood or APP 6 months  Signed, Rennis Harding, NP 05/14/2021 2:45 PM    [facility] at [facility] [address] [place], [place], [place] [address] Phone: [phone]; Fax: [phone]

## 2021-05-14 ENCOUNTER — Ambulatory Visit (INDEPENDENT_AMBULATORY_CARE_PROVIDER_SITE_OTHER): Payer: Medicare HMO | Admitting: Family Medicine

## 2021-05-14 ENCOUNTER — Encounter: Payer: Self-pay | Admitting: Family Medicine

## 2021-05-14 ENCOUNTER — Other Ambulatory Visit: Payer: Self-pay

## 2021-05-14 VITALS — BP 142/76 | HR 68 | Ht 69.0 in | Wt 254.8 lb

## 2021-05-14 DIAGNOSIS — G459 Transient cerebral ischemic attack, unspecified: Secondary | ICD-10-CM

## 2021-05-14 DIAGNOSIS — I10 Essential (primary) hypertension: Secondary | ICD-10-CM | POA: Diagnosis not present

## 2021-05-14 DIAGNOSIS — E782 Mixed hyperlipidemia: Secondary | ICD-10-CM

## 2021-05-14 MED ORDER — AMLODIPINE BESYLATE 10 MG PO TABS: 10.0000 mg | ORAL_TABLET | Freq: Every day | ORAL | 2 refills | Status: DC

## 2021-05-14 NOTE — Patient Instructions (Addendum)
Medication Instructions:  Increase Amlodipine to 10mg  daily.  Continue all other medications.    Labwork: none  Testing/Procedures: none  Follow-Up: 6 months   Any Other Special Instructions Will Be Listed Below (If Applicable). Goal BP 130/80  If you need a refill on your cardiac medications before your next appointment, please call your pharmacy.

## 2021-05-28 DIAGNOSIS — E1143 Type 2 diabetes mellitus with diabetic autonomic (poly)neuropathy: Secondary | ICD-10-CM | POA: Diagnosis not present

## 2021-05-28 DIAGNOSIS — I10 Essential (primary) hypertension: Secondary | ICD-10-CM | POA: Diagnosis not present

## 2021-06-28 DIAGNOSIS — E1143 Type 2 diabetes mellitus with diabetic autonomic (poly)neuropathy: Secondary | ICD-10-CM | POA: Diagnosis not present

## 2021-06-28 DIAGNOSIS — I10 Essential (primary) hypertension: Secondary | ICD-10-CM | POA: Diagnosis not present

## 2021-06-28 DIAGNOSIS — I2510 Atherosclerotic heart disease of native coronary artery without angina pectoris: Secondary | ICD-10-CM | POA: Diagnosis not present

## 2021-07-20 ENCOUNTER — Encounter: Payer: Self-pay | Admitting: Cardiology

## 2021-07-20 DIAGNOSIS — I2510 Atherosclerotic heart disease of native coronary artery without angina pectoris: Secondary | ICD-10-CM | POA: Diagnosis not present

## 2021-07-20 DIAGNOSIS — I10 Essential (primary) hypertension: Secondary | ICD-10-CM | POA: Diagnosis not present

## 2021-07-20 DIAGNOSIS — Z6838 Body mass index (BMI) 38.0-38.9, adult: Secondary | ICD-10-CM | POA: Diagnosis not present

## 2021-07-20 DIAGNOSIS — E1143 Type 2 diabetes mellitus with diabetic autonomic (poly)neuropathy: Secondary | ICD-10-CM | POA: Diagnosis not present

## 2021-07-20 DIAGNOSIS — I700 Atherosclerosis of aorta: Secondary | ICD-10-CM | POA: Diagnosis not present

## 2021-07-20 DIAGNOSIS — Z8673 Personal history of transient ischemic attack (TIA), and cerebral infarction without residual deficits: Secondary | ICD-10-CM | POA: Diagnosis not present

## 2021-07-20 DIAGNOSIS — Z0000 Encounter for general adult medical examination without abnormal findings: Secondary | ICD-10-CM | POA: Diagnosis not present

## 2021-09-03 DIAGNOSIS — J449 Chronic obstructive pulmonary disease, unspecified: Secondary | ICD-10-CM | POA: Diagnosis not present

## 2021-09-03 DIAGNOSIS — D3502 Benign neoplasm of left adrenal gland: Secondary | ICD-10-CM | POA: Diagnosis not present

## 2021-09-03 DIAGNOSIS — Z7902 Long term (current) use of antithrombotics/antiplatelets: Secondary | ICD-10-CM | POA: Diagnosis not present

## 2021-09-03 DIAGNOSIS — Z7982 Long term (current) use of aspirin: Secondary | ICD-10-CM | POA: Diagnosis not present

## 2021-09-03 DIAGNOSIS — E119 Type 2 diabetes mellitus without complications: Secondary | ICD-10-CM | POA: Diagnosis not present

## 2021-09-03 DIAGNOSIS — I700 Atherosclerosis of aorta: Secondary | ICD-10-CM | POA: Diagnosis not present

## 2021-09-03 DIAGNOSIS — Z90710 Acquired absence of both cervix and uterus: Secondary | ICD-10-CM | POA: Diagnosis not present

## 2021-09-03 DIAGNOSIS — R109 Unspecified abdominal pain: Secondary | ICD-10-CM | POA: Diagnosis not present

## 2021-09-03 DIAGNOSIS — Z79899 Other long term (current) drug therapy: Secondary | ICD-10-CM | POA: Diagnosis not present

## 2021-09-03 DIAGNOSIS — M549 Dorsalgia, unspecified: Secondary | ICD-10-CM | POA: Diagnosis not present

## 2021-09-03 DIAGNOSIS — Z794 Long term (current) use of insulin: Secondary | ICD-10-CM | POA: Diagnosis not present

## 2021-09-21 ENCOUNTER — Other Ambulatory Visit: Payer: Self-pay | Admitting: Cardiology

## 2021-09-28 DIAGNOSIS — E1143 Type 2 diabetes mellitus with diabetic autonomic (poly)neuropathy: Secondary | ICD-10-CM | POA: Diagnosis not present

## 2021-09-28 DIAGNOSIS — I10 Essential (primary) hypertension: Secondary | ICD-10-CM | POA: Diagnosis not present

## 2021-10-01 ENCOUNTER — Telehealth: Payer: Self-pay | Admitting: Cardiology

## 2021-10-01 MED ORDER — BENAZEPRIL HCL 40 MG PO TABS: 40.0000 mg | ORAL_TABLET | Freq: Every day | ORAL | 3 refills | Status: DC

## 2021-10-01 MED ORDER — ROSUVASTATIN CALCIUM 40 MG PO TABS: 40.0000 mg | ORAL_TABLET | Freq: Every day | ORAL | 3 refills | Status: DC

## 2021-10-01 NOTE — Telephone Encounter (Signed)
Done

## 2021-10-01 NOTE — Telephone Encounter (Signed)
*  STAT* If patient is at the pharmacy, call can be transferred to refill team.   1. Which medications need to be refilled? (please list name of each medication and dose if known) benazepril (LOTENSIN) 40 MG tablet rosuvastatin (CRESTOR) 40 MG tablet  2. Which pharmacy/location (including street and city if local pharmacy) is medication to be sent to? [facility] - [place], [place] - [place] [address]  3. Do they need a 30 day or 90 day supply? 90 day supply   Patient switched pharmacies

## 2021-11-11 DIAGNOSIS — I700 Atherosclerosis of aorta: Secondary | ICD-10-CM | POA: Diagnosis not present

## 2021-11-11 DIAGNOSIS — E1143 Type 2 diabetes mellitus with diabetic autonomic (poly)neuropathy: Secondary | ICD-10-CM | POA: Diagnosis not present

## 2021-11-11 DIAGNOSIS — I10 Essential (primary) hypertension: Secondary | ICD-10-CM | POA: Diagnosis not present

## 2021-11-11 DIAGNOSIS — I2510 Atherosclerotic heart disease of native coronary artery without angina pectoris: Secondary | ICD-10-CM | POA: Diagnosis not present

## 2021-11-11 DIAGNOSIS — Z8673 Personal history of transient ischemic attack (TIA), and cerebral infarction without residual deficits: Secondary | ICD-10-CM | POA: Diagnosis not present

## 2021-11-11 DIAGNOSIS — Z6837 Body mass index (BMI) 37.0-37.9, adult: Secondary | ICD-10-CM | POA: Diagnosis not present

## 2021-11-11 DIAGNOSIS — M5459 Other low back pain: Secondary | ICD-10-CM | POA: Diagnosis not present

## 2021-11-11 DIAGNOSIS — Z0000 Encounter for general adult medical examination without abnormal findings: Secondary | ICD-10-CM | POA: Diagnosis not present

## 2021-11-12 ENCOUNTER — Encounter: Payer: Self-pay | Admitting: *Deleted

## 2021-11-13 ENCOUNTER — Encounter: Payer: Self-pay | Admitting: Cardiology

## 2021-11-13 ENCOUNTER — Encounter: Payer: Self-pay | Admitting: *Deleted

## 2021-11-13 ENCOUNTER — Other Ambulatory Visit: Payer: Self-pay

## 2021-11-13 ENCOUNTER — Ambulatory Visit (INDEPENDENT_AMBULATORY_CARE_PROVIDER_SITE_OTHER): Payer: Medicare HMO | Admitting: Cardiology

## 2021-11-13 VITALS — BP 140/88 | HR 67 | Ht 69.0 in | Wt 251.2 lb

## 2021-11-13 DIAGNOSIS — I10 Essential (primary) hypertension: Secondary | ICD-10-CM | POA: Diagnosis not present

## 2021-11-13 DIAGNOSIS — R0789 Other chest pain: Secondary | ICD-10-CM | POA: Diagnosis not present

## 2021-11-13 DIAGNOSIS — Z79899 Other long term (current) drug therapy: Secondary | ICD-10-CM | POA: Diagnosis not present

## 2021-11-13 DIAGNOSIS — E782 Mixed hyperlipidemia: Secondary | ICD-10-CM

## 2021-11-13 DIAGNOSIS — I5032 Chronic diastolic (congestive) heart failure: Secondary | ICD-10-CM

## 2021-11-13 MED ORDER — PANTOPRAZOLE SODIUM 40 MG PO TBEC: 40.0000 mg | DELAYED_RELEASE_TABLET | Freq: Every day | ORAL | 1 refills | Status: DC

## 2021-11-13 MED ORDER — FUROSEMIDE 20 MG PO TABS: 20.0000 mg | ORAL_TABLET | Freq: Every day | ORAL | 1 refills | Status: DC

## 2021-11-13 MED ORDER — SPIRONOLACTONE 25 MG PO TABS: 25.0000 mg | ORAL_TABLET | Freq: Every day | ORAL | 1 refills | Status: DC

## 2021-11-13 NOTE — Progress Notes (Signed)
Clinical Summary Ms. Allbaugh is a 60 y.o.female seen today for follow up of the following medical problems.     1. HTN   -she is compliant with meds -    2. Hyperlipidemia   10/2019 TC 202 TG 76 HDL 65 LDL 123 - compliant with statin  01/2021 TC 148 TG 42 HDL 51 LDL 80 - she is on crestor 40mg  daily.      4. Chronic diastolic HF  - no recent edema - does have frequent leg cramps   5. History of CVA - admitted [facility] about 1 year ago - in hospital for about 1 week per her report - residual left sided weakness  04/2020 CT head [place] old lacunar infarct   - suspecte TIA during 01/2021 admission   6. Palpitations - no recent symptoms.    7. Chest pain -01/2021 admission with chest pain - 01/2021 cath without significant disease - still with chest pains at times - sharp pain at times midchest, worst with deep breathing. Lasted about 4.5 hours. Can awake from sleep.   Past Medical History:  Diagnosis Date   Aortic valve disease    Long-standing systolic murmur   Asthma    Uses p.r.n. albuterol   Bronchitis    COPD (chronic obstructive pulmonary disease) (HCC)    Degenerative joint disease    right knee   Depression    Depression with anxiety    Diabetes mellitus    Dyspnea    exertion   Dyspnea on exertion    poor exercise tolerance   Fibroids    uterine; postmenopausal bleeding   GERD (gastroesophageal reflux disease)    Headache(784.0)    Hyperlipidemia    Hypertension    Myocardial infarction ([place])    Obesity    Palpitations    Stroke ([place])    TIA - left side residual   Syncope    exertional   Tobacco user    30 pack years; 04/2011: 1/4 pack per day during quick attempt     Allergies  Allergen Reactions   Aspirin Itching    States that she takes aspirin regularly without significant reaction.   Penicillins Rash     Current Outpatient Medications  Medication Sig Dispense Refill   albuterol (PROVENTIL) (5 MG/ML) 0.5% nebulizer solution  Take 2.5 mg by nebulization 3 (three) times daily as needed for wheezing or shortness of breath.      albuterol (PROVENTIL,VENTOLIN) 90 MCG/ACT inhaler Inhale 2 puffs into the lungs every 6 (six) hours as needed for wheezing or shortness of breath.      amLODipine (NORVASC) 10 MG tablet Take 1 tablet (10 mg total) by mouth daily. 90 tablet 2   aspirin EC 81 MG tablet Take 81 mg by mouth daily.     benazepril (LOTENSIN) 40 MG tablet Take 1 tablet (40 mg total) by mouth daily. 90 tablet 3   chlorthalidone (HYGROTON) 50 MG tablet Take 50 mg by mouth daily.     cloNIDine (CATAPRES) 0.2 MG tablet Take 1 tablet (0.2 mg total) by mouth 3 (three) times daily. 90 tablet 2   estradiol (ESTRACE) 1 MG tablet Take 1 mg by mouth daily.     Fluticasone-Umeclidin-Vilant (TRELEGY ELLIPTA) 100-62.5-25 MCG/INH AEPB Inhale 1 puff into the lungs daily.     furosemide (LASIX) 40 MG tablet Take 1 tablet (40 mg total) by mouth daily. 30 tablet 2   insulin aspart (NOVOLOG) 100 UNIT/ML injection Inject 5 Units  into the skin 3 (three) times daily after meals.     insulin degludec (TRESIBA FLEXTOUCH) 100 UNIT/ML FlexTouch Pen Inject 20 Units into the skin at bedtime.     JANUMET 50-500 MG tablet Take 1 tablet by mouth 2 (two) times daily.     JARDIANCE 25 MG TABS tablet Take 25 mg by mouth daily.      methocarbamol (ROBAXIN) 500 MG tablet Take 500 mg by mouth at bedtime.     Multiple Vitamins-Minerals (MULTIVITAMIN ADULT) CHEW Chew 1 tablet by mouth daily.     risperiDONE (RISPERDAL) 0.5 MG tablet Take 0.5 mg by mouth at bedtime.     rosuvastatin (CRESTOR) 40 MG tablet Take 1 tablet (40 mg total) by mouth daily. 90 tablet 3   topiramate (TOPAMAX) 50 MG tablet Take 50 mg by mouth 2 (two) times daily as needed (headaches).     No current facility-administered medications for this visit.     Past Surgical History:  Procedure Laterality Date   ABDOMINAL HYSTERECTOMY  12/21/2011   Procedure: HYSTERECTOMY ABDOMINAL;   Surgeon: Jonnie Kind, MD;  Location: [facility];  Service: Gynecology;  Laterality: N/A;   CATARACT EXTRACTION W/PHACO Left 10/03/2020   Procedure: CATARACT EXTRACTION PHACO AND INTRAOCULAR LENS PLACEMENT LEFT EYE;  Surgeon: Baruch Goldmann, MD;  Location: [facility];  Service: Ophthalmology;  Laterality: Left;  CDE 5.04   CATARACT EXTRACTION W/PHACO Right 10/17/2020   Procedure: CATARACT EXTRACTION PHACO AND INTRAOCULAR LENS PLACEMENT RIGHT EYE;  Surgeon: Baruch Goldmann, MD;  Location: [facility];  Service: Ophthalmology;  Laterality: Right;  CDE: 4.60   LEFT HEART CATH AND CORONARY ANGIOGRAPHY N/A 02/17/2021   Procedure: LEFT HEART CATH AND CORONARY ANGIOGRAPHY;  Surgeon: Sherren Mocha, MD;  Location: [place] [facility];  Service: Cardiovascular;  Laterality: N/A;   TUBAL LIGATION       Allergies  Allergen Reactions   Aspirin Itching    States that she takes aspirin regularly without significant reaction.   Penicillins Rash      Family History  Problem Relation Age of Onset   Lung cancer Mother    Heart disease Brother    Breast cancer Sister    Heart disease Maternal Aunt    Cancer Cousin        lung   Anesthesia problems Neg Hx    Hypotension Neg Hx    Malignant hyperthermia Neg Hx    Pseudochol deficiency Neg Hx      Social History Ms. Wimbish reports that she has been smoking cigarettes. She started smoking about 47 years ago. She has a 15.00 pack-year smoking history. She has never used smokeless tobacco. Ms. Schaffer reports no history of alcohol use.   Review of Systems CONSTITUTIONAL: No weight loss, fever, chills, weakness or fatigue.  HEENT: Eyes: No visual loss, blurred vision, double vision or yellow sclerae.No hearing loss, sneezing, congestion, runny nose or sore throat.  SKIN: No rash or itching.  CARDIOVASCULAR: per hpi RESPIRATORY: No shortness of breath, cough or sputum.  GASTROINTESTINAL: No anorexia, nausea, vomiting or diarrhea. No abdominal pain or blood.   GENITOURINARY: No burning on urination, no polyuria NEUROLOGICAL: No headache, dizziness, syncope, paralysis, ataxia, numbness or tingling in the extremities. No change in bowel or bladder control.  MUSCULOSKELETAL: No muscle, back pain, joint pain or stiffness.  LYMPHATICS: No enlarged nodes. No history of splenectomy.  PSYCHIATRIC: No history of depression or anxiety.  ENDOCRINOLOGIC: No reports of sweating, cold or heat intolerance. No polyuria or polydipsia.  Marland Kitchen  Physical Examination Today's Vitals   11/13/21 1332  BP: 140/88  Pulse: 67  SpO2: 98%  Weight: 251 lb 3.2 oz (113.9 kg)  Height: 5\' 9"  (1.753 m)   Body mass index is 37.1 kg/m.  Gen: resting comfortably, no acute distress HEENT: no scleral icterus, pupils equal round and reactive, no palptable cervical adenopathy,  CV: RRR, no m/r/g, no jvd Resp: Clear to auscultation bilaterally GI: abdomen is soft, non-tender, non-distended, normal bowel sounds, no hepatosplenomegaly MSK: extremities are warm, no edema.  Skin: warm, no rash Neuro:  no focal deficits Psych: appropriate affect   Diagnostic Studies  10/2012 Event monitor: no arrhythmias     08/2010 Echo:  Left ventricle: The cavity size was normal. Wall thickness was   normal. Systolic function was normal. The estimated ejection   fraction was in the range of 60% to 65%. Wall motion was normal;   there were no regional wall motion abnormalities. The study is not   technically sufficient to allow evaluation of LV diastolic   function.   - Aortic valve: Mildly calcified annulus. Mild regurgitation.   - Mitral valve: Mild regurgitation.   - Tricuspid valve: Mild regurgitation.   - Pericardium, extracardiac: There was no pericardial effusion.     11/14/13 Clinic EKG   Sinus rhythm     10/2013 Echo   LVEF 60-65%, grade II diastolic dysfunction, mild AI, mild MR     01/23/14 Clinic EKG   NSR, LAE, no ischemic changes   01/2021 echo IMPRESSIONS     1.  Left ventricular ejection fraction, by estimation, is 60 to 65%. The  left ventricle has normal function. The left ventricle has no regional  wall motion abnormalities. There is moderate concentric left ventricular  hypertrophy. Left ventricular  diastolic parameters are indeterminate. Elevated left ventricular  end-diastolic pressure.   2. Right ventricular systolic function is normal. The right ventricular  size is normal. Tricuspid regurgitation signal is inadequate for assessing  PA pressure.   3. The mitral valve is normal in structure. Trivial mitral valve  regurgitation. No evidence of mitral stenosis.   4. The aortic valve is calcified. Aortic valve regurgitation is mild.  Mild to moderate aortic valve sclerosis/calcification is present, without  any evidence of aortic stenosis. Focal dense calcification of the left  coornary cusp is present. Aortic  regurgitation PHT measures 543 msec.   5. The inferior vena cava is dilated in size with <50% respiratory  variability, suggesting right atrial pressure of 15 mmHg.  01/2021 cath 1. Widely patent coronary arteries with no obstructive CAD 2. Normal LV function   Assessment and Plan  1. Chest pain - negative cath earlier this year - some intermittent symptoms at times, often with laying down. Will try changing omeprazole to protonix 40mg  daily.    2. Hyperlipidemia   - given prior CVA goal LDL would be <70 - LDL remains above goal on crestor 40mg  daily, add zetia 10mg  daily.    3. Chronic diastolic heart failure   -apears euvoloemic. Frequent muscle cramps, will try lowering lasix to 20mg  daily. - check bmet/mg on diuretic.     4. HTN - above goal, add aldactone 25mg  daily Check bmet/mg in 2 weeks       Arnoldo Lenis, M.D.

## 2021-11-13 NOTE — Patient Instructions (Addendum)
Medication Instructions:  Your physician has recommended you make the following change in your medication:  Stop omeprazole Start pantoprazole 40 mg daily Start spironolactone 25 mg daily Decrease furosemide to 20 mg daily Continue other medications the same  Labwork: BMET & Mg in 2 weeks (11/27/21)  [facility] No appointment needed  Testing/Procedures: none  Follow-Up: Your physician recommends that you schedule a follow-up appointment in: 6 months  Any Other Special Instructions Will Be Listed Below (If Applicable). Call [phone] for assistance finding a new primary care provider  If you need a refill on your cardiac medications before your next appointment, please call your pharmacy.

## 2021-12-27 DIAGNOSIS — Z9851 Tubal ligation status: Secondary | ICD-10-CM | POA: Diagnosis not present

## 2021-12-27 DIAGNOSIS — J449 Chronic obstructive pulmonary disease, unspecified: Secondary | ICD-10-CM | POA: Diagnosis not present

## 2021-12-27 DIAGNOSIS — F419 Anxiety disorder, unspecified: Secondary | ICD-10-CM | POA: Diagnosis not present

## 2021-12-27 DIAGNOSIS — I252 Old myocardial infarction: Secondary | ICD-10-CM | POA: Diagnosis not present

## 2021-12-27 DIAGNOSIS — R0789 Other chest pain: Secondary | ICD-10-CM | POA: Diagnosis not present

## 2021-12-27 DIAGNOSIS — E119 Type 2 diabetes mellitus without complications: Secondary | ICD-10-CM | POA: Diagnosis not present

## 2021-12-27 DIAGNOSIS — Z90710 Acquired absence of both cervix and uterus: Secondary | ICD-10-CM | POA: Diagnosis not present

## 2021-12-27 DIAGNOSIS — R079 Chest pain, unspecified: Secondary | ICD-10-CM | POA: Diagnosis not present

## 2021-12-27 DIAGNOSIS — R0689 Other abnormalities of breathing: Secondary | ICD-10-CM | POA: Diagnosis not present

## 2021-12-27 DIAGNOSIS — I10 Essential (primary) hypertension: Secondary | ICD-10-CM | POA: Diagnosis not present

## 2021-12-27 DIAGNOSIS — R000 Tachycardia, unspecified: Secondary | ICD-10-CM | POA: Diagnosis not present

## 2021-12-27 DIAGNOSIS — Z8673 Personal history of transient ischemic attack (TIA), and cerebral infarction without residual deficits: Secondary | ICD-10-CM | POA: Diagnosis not present

## 2021-12-30 DIAGNOSIS — Z961 Presence of intraocular lens: Secondary | ICD-10-CM | POA: Diagnosis not present

## 2021-12-30 DIAGNOSIS — H16223 Keratoconjunctivitis sicca, not specified as Sjogren's, bilateral: Secondary | ICD-10-CM | POA: Diagnosis not present

## 2021-12-30 DIAGNOSIS — H524 Presbyopia: Secondary | ICD-10-CM | POA: Diagnosis not present

## 2021-12-30 DIAGNOSIS — I10 Essential (primary) hypertension: Secondary | ICD-10-CM | POA: Diagnosis not present

## 2021-12-30 DIAGNOSIS — E119 Type 2 diabetes mellitus without complications: Secondary | ICD-10-CM | POA: Diagnosis not present

## 2021-12-30 DIAGNOSIS — H26493 Other secondary cataract, bilateral: Secondary | ICD-10-CM | POA: Diagnosis not present

## 2021-12-30 DIAGNOSIS — Z794 Long term (current) use of insulin: Secondary | ICD-10-CM | POA: Diagnosis not present

## 2021-12-30 DIAGNOSIS — H35033 Hypertensive retinopathy, bilateral: Secondary | ICD-10-CM | POA: Diagnosis not present

## 2021-12-30 DIAGNOSIS — Z0100 Encounter for examination of eyes and vision without abnormal findings: Secondary | ICD-10-CM | POA: Diagnosis not present

## 2021-12-30 DIAGNOSIS — Z7984 Long term (current) use of oral hypoglycemic drugs: Secondary | ICD-10-CM | POA: Diagnosis not present

## 2022-01-13 DIAGNOSIS — R0789 Other chest pain: Secondary | ICD-10-CM | POA: Diagnosis not present

## 2022-01-13 DIAGNOSIS — Z6838 Body mass index (BMI) 38.0-38.9, adult: Secondary | ICD-10-CM | POA: Diagnosis not present

## 2022-01-20 DIAGNOSIS — R0789 Other chest pain: Secondary | ICD-10-CM | POA: Diagnosis not present

## 2022-04-19 DIAGNOSIS — Z1231 Encounter for screening mammogram for malignant neoplasm of breast: Secondary | ICD-10-CM | POA: Diagnosis not present

## 2022-04-28 DIAGNOSIS — Z6838 Body mass index (BMI) 38.0-38.9, adult: Secondary | ICD-10-CM | POA: Diagnosis not present

## 2022-04-28 DIAGNOSIS — Z0000 Encounter for general adult medical examination without abnormal findings: Secondary | ICD-10-CM | POA: Diagnosis not present

## 2022-04-28 DIAGNOSIS — I700 Atherosclerosis of aorta: Secondary | ICD-10-CM | POA: Diagnosis not present

## 2022-04-28 DIAGNOSIS — Z8673 Personal history of transient ischemic attack (TIA), and cerebral infarction without residual deficits: Secondary | ICD-10-CM | POA: Diagnosis not present

## 2022-04-28 DIAGNOSIS — I2510 Atherosclerotic heart disease of native coronary artery without angina pectoris: Secondary | ICD-10-CM | POA: Diagnosis not present

## 2022-04-28 DIAGNOSIS — E7849 Other hyperlipidemia: Secondary | ICD-10-CM | POA: Diagnosis not present

## 2022-04-28 DIAGNOSIS — M5459 Other low back pain: Secondary | ICD-10-CM | POA: Diagnosis not present

## 2022-04-28 DIAGNOSIS — E669 Obesity, unspecified: Secondary | ICD-10-CM | POA: Diagnosis not present

## 2022-04-28 DIAGNOSIS — E1143 Type 2 diabetes mellitus with diabetic autonomic (poly)neuropathy: Secondary | ICD-10-CM | POA: Diagnosis not present

## 2022-04-28 DIAGNOSIS — I10 Essential (primary) hypertension: Secondary | ICD-10-CM | POA: Diagnosis not present

## 2022-05-28 ENCOUNTER — Ambulatory Visit: Payer: Medicare HMO | Admitting: Cardiology

## 2022-05-28 NOTE — Progress Notes (Deleted)
Clinical Summary Amber Harris is a 61 y.o.female  seen today for follow up of the following medical problems.      1. HTN   -she is compliant with meds -     2. Hyperlipidemia   10/2019 TC 202 TG 76 HDL 65 LDL 123 - compliant with statin   01/2021 TC 148 TG 42 HDL 51 LDL 80 - she is on crestor '40mg'$  daily.      4. Chronic diastolic HF  - no recent edema - does have frequent leg cramps   5. History of CVA - admitted [facility] about 1 year ago - in hospital for about 1 week per her report - residual left sided weakness   04/2020 CT head [facility] old lacunar infarct   - suspecte TIA during 01/2021 admission   6. Palpitations - no recent symptoms.      7. Chest pain -01/2021 admission with chest pain - 01/2021 cath without significant disease - still with chest pains at times - sharp pain at times midchest, worst with deep breathing. Lasted about 4.5 hours. Can awake from sleep.   -last visit changed omeprazole to protonix Past Medical History:  Diagnosis Date   Aortic valve disease    Long-standing systolic murmur   Asthma    Uses p.r.n. albuterol   Bronchitis    COPD (chronic obstructive pulmonary disease) (HCC)    Degenerative joint disease    right knee   Depression    Depression with anxiety    Diabetes mellitus    Dyspnea    exertion   Dyspnea on exertion    poor exercise tolerance   Fibroids    uterine; postmenopausal bleeding   GERD (gastroesophageal reflux disease)    Headache(784.0)    Hyperlipidemia    Hypertension    Myocardial infarction ([place])    Obesity    Palpitations    Stroke ([place])    TIA - left side residual   Syncope    exertional   Tobacco user    30 pack years; 04/2011: 1/4 pack per day during quick attempt     Allergies  Allergen Reactions   Aspirin Itching    States that she takes aspirin regularly without significant reaction.   Penicillins Rash     Current Outpatient Medications  Medication Sig Dispense Refill    albuterol (PROVENTIL) (5 MG/ML) 0.5% nebulizer solution Take 2.5 mg by nebulization 3 (three) times daily as needed for wheezing or shortness of breath.      albuterol (PROVENTIL,VENTOLIN) 90 MCG/ACT inhaler Inhale 2 puffs into the lungs every 6 (six) hours as needed for wheezing or shortness of breath.      amLODipine (NORVASC) 10 MG tablet Take 1 tablet (10 mg total) by mouth daily. 90 tablet 2   aspirin EC 81 MG tablet Take 81 mg by mouth daily.     benazepril (LOTENSIN) 40 MG tablet Take 1 tablet (40 mg total) by mouth daily. 90 tablet 3   chlorthalidone (HYGROTON) 50 MG tablet Take 50 mg by mouth daily.     cloNIDine (CATAPRES) 0.2 MG tablet Take 1 tablet (0.2 mg total) by mouth 3 (three) times daily. 90 tablet 2   estradiol (ESTRACE) 1 MG tablet Take 1 mg by mouth daily.     Fluticasone-Umeclidin-Vilant (TRELEGY ELLIPTA) 100-62.5-25 MCG/INH AEPB Inhale 1 puff into the lungs daily.     furosemide (LASIX) 20 MG tablet Take 1 tablet (20 mg total) by mouth daily. [place]  tablet 1   insulin aspart (NOVOLOG) 100 UNIT/ML injection Inject 5 Units into the skin 3 (three) times daily after meals.     insulin degludec (TRESIBA FLEXTOUCH) 100 UNIT/ML FlexTouch Pen Inject 20 Units into the skin at bedtime.     JANUMET 50-500 MG tablet Take 1 tablet by mouth 2 (two) times daily.     JARDIANCE 25 MG TABS tablet Take 25 mg by mouth daily.      methocarbamol (ROBAXIN) 500 MG tablet Take 500 mg by mouth at bedtime.     Multiple Vitamins-Minerals (MULTIVITAMIN ADULT) CHEW Chew 1 tablet by mouth daily.     pantoprazole (PROTONIX) 40 MG tablet Take 1 tablet (40 mg total) by mouth daily. 90 tablet 1   risperiDONE (RISPERDAL) 0.5 MG tablet Take 0.5 mg by mouth at bedtime.     rosuvastatin (CRESTOR) 40 MG tablet Take 1 tablet (40 mg total) by mouth daily. 90 tablet 3   spironolactone (ALDACTONE) 25 MG tablet Take 1 tablet (25 mg total) by mouth daily. 90 tablet 1   topiramate (TOPAMAX) 50 MG tablet Take 50 mg by mouth  2 (two) times daily as needed (headaches).     No current facility-administered medications for this visit.     Past Surgical History:  Procedure Laterality Date   ABDOMINAL HYSTERECTOMY  12/21/2011   Procedure: HYSTERECTOMY ABDOMINAL;  Surgeon: Jonnie Kind, MD;  Location: [facility];  Service: Gynecology;  Laterality: N/A;   CATARACT EXTRACTION W/PHACO Left 10/03/2020   Procedure: CATARACT EXTRACTION PHACO AND INTRAOCULAR LENS PLACEMENT LEFT EYE;  Surgeon: Baruch Goldmann, MD;  Location: [facility];  Service: Ophthalmology;  Laterality: Left;  CDE 5.04   CATARACT EXTRACTION W/PHACO Right 10/17/2020   Procedure: CATARACT EXTRACTION PHACO AND INTRAOCULAR LENS PLACEMENT RIGHT EYE;  Surgeon: Baruch Goldmann, MD;  Location: [facility];  Service: Ophthalmology;  Laterality: Right;  CDE: 4.60   LEFT HEART CATH AND CORONARY ANGIOGRAPHY N/A 02/17/2021   Procedure: LEFT HEART CATH AND CORONARY ANGIOGRAPHY;  Surgeon: Sherren Mocha, MD;  Location: [place] [facility];  Service: Cardiovascular;  Laterality: N/A;   TUBAL LIGATION       Allergies  Allergen Reactions   Aspirin Itching    States that she takes aspirin regularly without significant reaction.   Penicillins Rash      Family History  Problem Relation Age of Onset   Lung cancer Mother    Heart disease Brother    Breast cancer Sister    Heart disease Maternal Aunt    Cancer Cousin        lung   Anesthesia problems Neg Hx    Hypotension Neg Hx    Malignant hyperthermia Neg Hx    Pseudochol deficiency Neg Hx      Social History Amber Harris reports that she has been smoking cigarettes. She started smoking about 47 years ago. She has a 15.00 pack-year smoking history. She has never used smokeless tobacco. Amber Harris reports no history of alcohol use.   Review of Systems CONSTITUTIONAL: No weight loss, fever, chills, weakness or fatigue.  HEENT: Eyes: No visual loss, blurred vision, double vision or yellow sclerae.No hearing loss,  sneezing, congestion, runny nose or sore throat.  SKIN: No rash or itching.  CARDIOVASCULAR:  RESPIRATORY: No shortness of breath, cough or sputum.  GASTROINTESTINAL: No anorexia, nausea, vomiting or diarrhea. No abdominal pain or blood.  GENITOURINARY: No burning on urination, no polyuria NEUROLOGICAL: No headache, dizziness, syncope, paralysis, ataxia, numbness or tingling in  the extremities. No change in bowel or bladder control.  MUSCULOSKELETAL: No muscle, back pain, joint pain or stiffness.  LYMPHATICS: No enlarged nodes. No history of splenectomy.  PSYCHIATRIC: No history of depression or anxiety.  ENDOCRINOLOGIC: No reports of sweating, cold or heat intolerance. No polyuria or polydipsia.  Marland Kitchen   Physical Examination There were no vitals filed for this visit. There were no vitals filed for this visit.  Gen: resting comfortably, no acute distress HEENT: no scleral icterus, pupils equal round and reactive, no palptable cervical adenopathy,  CV Resp: Clear to auscultation bilaterally GI: abdomen is soft, non-tender, non-distended, normal bowel sounds, no hepatosplenomegaly MSK: extremities are warm, no edema.  Skin: warm, no rash Neuro:  no focal deficits Psych: appropriate affect   Diagnostic Studies  10/2012 Event monitor: no arrhythmias     08/2010 Echo:  Left ventricle: The cavity size was normal. Wall thickness was   normal. Systolic function was normal. The estimated ejection   fraction was in the range of 60% to 65%. Wall motion was normal;   there were no regional wall motion abnormalities. The study is not   technically sufficient to allow evaluation of LV diastolic   function.   - Aortic valve: Mildly calcified annulus. Mild regurgitation.   - Mitral valve: Mild regurgitation.   - Tricuspid valve: Mild regurgitation.   - Pericardium, extracardiac: There was no pericardial effusion.     11/14/13 Clinic EKG   Sinus rhythm     10/2013 Echo   LVEF 60-65%,  grade II diastolic dysfunction, mild AI, mild MR     01/23/14 Clinic EKG   NSR, LAE, no ischemic changes   01/2021 echo IMPRESSIONS     1. Left ventricular ejection fraction, by estimation, is 60 to 65%. The  left ventricle has normal function. The left ventricle has no regional  wall motion abnormalities. There is moderate concentric left ventricular  hypertrophy. Left ventricular  diastolic parameters are indeterminate. Elevated left ventricular  end-diastolic pressure.   2. Right ventricular systolic function is normal. The right ventricular  size is normal. Tricuspid regurgitation signal is inadequate for assessing  PA pressure.   3. The mitral valve is normal in structure. Trivial mitral valve  regurgitation. No evidence of mitral stenosis.   4. The aortic valve is calcified. Aortic valve regurgitation is mild.  Mild to moderate aortic valve sclerosis/calcification is present, without  any evidence of aortic stenosis. Focal dense calcification of the left  coornary cusp is present. Aortic  regurgitation PHT measures 543 msec.   5. The inferior vena cava is dilated in size with <50% respiratory  variability, suggesting right atrial pressure of 15 mmHg.   01/2021 cath 1. Widely patent coronary arteries with no obstructive CAD 2. Normal LV function   Assessment and Plan  1. Chest pain - negative cath earlier this year - some intermittent symptoms at times, often with laying down. Will try changing omeprazole to protonix '40mg'$  daily.    2. Hyperlipidemia   - given prior CVA goal LDL would be <70 - LDL remains above goal on crestor '40mg'$  daily, add zetia '10mg'$  daily.    3. Chronic diastolic heart failure   -apears euvoloemic. Frequent muscle cramps, will try lowering lasix to '20mg'$  daily. - check bmet/mg on diuretic.     4. HTN - above goal, add aldactone '25mg'$  daily Check bmet/mg in 2 weeks                Amber Calais F.  Harl Bowie, M.D., HarrisA.C.C.

## 2022-05-31 ENCOUNTER — Other Ambulatory Visit: Payer: Self-pay | Admitting: *Deleted

## 2022-05-31 MED ORDER — BENAZEPRIL HCL 40 MG PO TABS: 40.0000 mg | ORAL_TABLET | Freq: Every day | ORAL | 0 refills | Status: DC

## 2022-07-17 DIAGNOSIS — M545 Low back pain, unspecified: Secondary | ICD-10-CM | POA: Diagnosis not present

## 2022-07-17 DIAGNOSIS — R109 Unspecified abdominal pain: Secondary | ICD-10-CM | POA: Diagnosis not present

## 2022-07-17 DIAGNOSIS — Z5321 Procedure and treatment not carried out due to patient leaving prior to being seen by health care provider: Secondary | ICD-10-CM | POA: Diagnosis not present

## 2022-07-21 ENCOUNTER — Other Ambulatory Visit: Payer: Self-pay | Admitting: Cardiology

## 2022-07-21 DIAGNOSIS — I639 Cerebral infarction, unspecified: Secondary | ICD-10-CM

## 2022-07-22 ENCOUNTER — Ambulatory Visit (INDEPENDENT_AMBULATORY_CARE_PROVIDER_SITE_OTHER): Payer: Medicare HMO

## 2022-07-22 DIAGNOSIS — I639 Cerebral infarction, unspecified: Secondary | ICD-10-CM

## 2022-07-22 DIAGNOSIS — Z8673 Personal history of transient ischemic attack (TIA), and cerebral infarction without residual deficits: Secondary | ICD-10-CM | POA: Diagnosis not present

## 2022-07-27 ENCOUNTER — Telehealth: Payer: Self-pay

## 2022-07-27 NOTE — Telephone Encounter (Signed)
Patient notified and verbalized understanding. Patient had no questions or concerns at this time. PCP copied 

## 2022-07-27 NOTE — Telephone Encounter (Signed)
-----   Message from Arnoldo Lenis, MD sent at 07/27/2022 10:53 AM EDT ----- Carotid US shows just mild plaque right side, left side is normal. Just something to monitor at this time  Zandra Abts MD

## 2022-07-28 DIAGNOSIS — R1030 Lower abdominal pain, unspecified: Secondary | ICD-10-CM | POA: Diagnosis not present

## 2022-07-28 DIAGNOSIS — Z6839 Body mass index (BMI) 39.0-39.9, adult: Secondary | ICD-10-CM | POA: Diagnosis not present

## 2022-08-05 DIAGNOSIS — R109 Unspecified abdominal pain: Secondary | ICD-10-CM | POA: Diagnosis not present

## 2022-08-05 DIAGNOSIS — K828 Other specified diseases of gallbladder: Secondary | ICD-10-CM | POA: Diagnosis not present

## 2022-09-16 ENCOUNTER — Encounter: Payer: Self-pay | Admitting: *Deleted

## 2022-09-16 ENCOUNTER — Ambulatory Visit: Payer: Medicare HMO | Admitting: Cardiology

## 2022-09-16 NOTE — Progress Notes (Deleted)
Clinical Summary Ms. Gulden is a 61 y.o.female  seen today for follow up of the following medical problems.      1. HTN   -she is compliant with meds -     2. Hyperlipidemia   10/2019 TC 202 TG 76 HDL 65 LDL 123 - compliant with statin   01/2021 TC 148 TG 42 HDL 51 LDL 80 - she is on crestor '40mg'$  daily.    - we added zetia '10mg'$  daily 10/2021   4. Chronic diastolic HF  - no recent edema - does have frequent leg cramps   5. History of CVA - admitted [facility] about 1 year ago - in hospital for about 1 week per her report - residual left sided weakness   04/2020 CT head [facility] old lacunar infarct   - suspecte TIA during 01/2021 admission   6. Palpitations - no recent symptoms.      7. Chest pain -01/2021 admission with chest pain - 01/2021 cath without significant disease - still with chest pains at times - sharp pain at times midchest, worst with deep breathing. Lasted about 4.5 hours. Can awake from sleep.   8. Carotid stenosis    Past Medical History:  Diagnosis Date   Aortic valve disease    Long-standing systolic murmur   Asthma    Uses p.r.n. albuterol   Bronchitis    COPD (chronic obstructive pulmonary disease) (HCC)    Degenerative joint disease    right knee   Depression    Depression with anxiety    Diabetes mellitus    Dyspnea    exertion   Dyspnea on exertion    poor exercise tolerance   Fibroids    uterine; postmenopausal bleeding   GERD (gastroesophageal reflux disease)    Headache(784.0)    Hyperlipidemia    Hypertension    Myocardial infarction ([place])    Obesity    Palpitations    Stroke ([place])    TIA - left side residual   Syncope    exertional   Tobacco user    30 pack years; 04/2011: 1/4 pack per day during quick attempt     Allergies  Allergen Reactions   Aspirin Itching    States that she takes aspirin regularly without significant reaction.   Penicillins Rash     Current Outpatient Medications  Medication Sig  Dispense Refill   albuterol (PROVENTIL) (5 MG/ML) 0.5% nebulizer solution Take 2.5 mg by nebulization 3 (three) times daily as needed for wheezing or shortness of breath.      albuterol (PROVENTIL,VENTOLIN) 90 MCG/ACT inhaler Inhale 2 puffs into the lungs every 6 (six) hours as needed for wheezing or shortness of breath.      amLODipine (NORVASC) 10 MG tablet Take 1 tablet (10 mg total) by mouth daily. 90 tablet 2   aspirin EC 81 MG tablet Take 81 mg by mouth daily.     benazepril (LOTENSIN) 40 MG tablet Take 1 tablet (40 mg total) by mouth daily. 90 tablet 0   chlorthalidone (HYGROTON) 50 MG tablet Take 50 mg by mouth daily.     cloNIDine (CATAPRES) 0.2 MG tablet Take 1 tablet (0.2 mg total) by mouth 3 (three) times daily. 90 tablet 2   estradiol (ESTRACE) 1 MG tablet Take 1 mg by mouth daily.     Fluticasone-Umeclidin-Vilant (TRELEGY ELLIPTA) 100-62.5-25 MCG/INH AEPB Inhale 1 puff into the lungs daily.     furosemide (LASIX) 20 MG tablet Take 1  tablet (20 mg total) by mouth daily. 90 tablet 1   insulin aspart (NOVOLOG) 100 UNIT/ML injection Inject 5 Units into the skin 3 (three) times daily after meals.     insulin degludec (TRESIBA FLEXTOUCH) 100 UNIT/ML FlexTouch Pen Inject 20 Units into the skin at bedtime.     JANUMET 50-500 MG tablet Take 1 tablet by mouth 2 (two) times daily.     JARDIANCE 25 MG TABS tablet Take 25 mg by mouth daily.      methocarbamol (ROBAXIN) 500 MG tablet Take 500 mg by mouth at bedtime.     Multiple Vitamins-Minerals (MULTIVITAMIN ADULT) CHEW Chew 1 tablet by mouth daily.     pantoprazole (PROTONIX) 40 MG tablet Take 1 tablet (40 mg total) by mouth daily. 90 tablet 1   risperiDONE (RISPERDAL) 0.5 MG tablet Take 0.5 mg by mouth at bedtime.     rosuvastatin (CRESTOR) 40 MG tablet Take 1 tablet (40 mg total) by mouth daily. 90 tablet 3   spironolactone (ALDACTONE) 25 MG tablet Take 1 tablet (25 mg total) by mouth daily. 90 tablet 1   topiramate (TOPAMAX) 50 MG tablet  Take 50 mg by mouth 2 (two) times daily as needed (headaches).     No current facility-administered medications for this visit.     Past Surgical History:  Procedure Laterality Date   ABDOMINAL HYSTERECTOMY  12/21/2011   Procedure: HYSTERECTOMY ABDOMINAL;  Surgeon: Jonnie Kind, MD;  Location: [facility];  Service: Gynecology;  Laterality: N/A;   CATARACT EXTRACTION W/PHACO Left 10/03/2020   Procedure: CATARACT EXTRACTION PHACO AND INTRAOCULAR LENS PLACEMENT LEFT EYE;  Surgeon: Baruch Goldmann, MD;  Location: [facility];  Service: Ophthalmology;  Laterality: Left;  CDE 5.04   CATARACT EXTRACTION W/PHACO Right 10/17/2020   Procedure: CATARACT EXTRACTION PHACO AND INTRAOCULAR LENS PLACEMENT RIGHT EYE;  Surgeon: Baruch Goldmann, MD;  Location: [facility];  Service: Ophthalmology;  Laterality: Right;  CDE: 4.60   LEFT HEART CATH AND CORONARY ANGIOGRAPHY N/A 02/17/2021   Procedure: LEFT HEART CATH AND CORONARY ANGIOGRAPHY;  Surgeon: Sherren Mocha, MD;  Location: [place] [facility];  Service: Cardiovascular;  Laterality: N/A;   TUBAL LIGATION       Allergies  Allergen Reactions   Aspirin Itching    States that she takes aspirin regularly without significant reaction.   Penicillins Rash      Family History  Problem Relation Age of Onset   Lung cancer Mother    Heart disease Brother    Breast cancer Sister    Heart disease Maternal Aunt    Cancer Cousin        lung   Anesthesia problems Neg Hx    Hypotension Neg Hx    Malignant hyperthermia Neg Hx    Pseudochol deficiency Neg Hx      Social History Ms. Guillotte reports that she has been smoking cigarettes. She started smoking about 47 years ago. She has a 15.00 pack-year smoking history. She has never used smokeless tobacco. Ms. Talamantez reports no history of alcohol use.   Review of Systems CONSTITUTIONAL: No weight loss, fever, chills, weakness or fatigue.  HEENT: Eyes: No visual loss, blurred vision, double vision or yellow sclerae.No  hearing loss, sneezing, congestion, runny nose or sore throat.  SKIN: No rash or itching.  CARDIOVASCULAR:  RESPIRATORY: No shortness of breath, cough or sputum.  GASTROINTESTINAL: No anorexia, nausea, vomiting or diarrhea. No abdominal pain or blood.  GENITOURINARY: No burning on urination, no polyuria NEUROLOGICAL: No headache,  dizziness, syncope, paralysis, ataxia, numbness or tingling in the extremities. No change in bowel or bladder control.  MUSCULOSKELETAL: No muscle, back pain, joint pain or stiffness.  LYMPHATICS: No enlarged nodes. No history of splenectomy.  PSYCHIATRIC: No history of depression or anxiety.  ENDOCRINOLOGIC: No reports of sweating, cold or heat intolerance. No polyuria or polydipsia.  Marland Kitchen   Physical Examination There were no vitals filed for this visit. There were no vitals filed for this visit.  Gen: resting comfortably, no acute distress HEENT: no scleral icterus, pupils equal round and reactive, no palptable cervical adenopathy,  CV Resp: Clear to auscultation bilaterally GI: abdomen is soft, non-tender, non-distended, normal bowel sounds, no hepatosplenomegaly MSK: extremities are warm, no edema.  Skin: warm, no rash Neuro:  no focal deficits Psych: appropriate affect   Diagnostic Studies  10/2012 Event monitor: no arrhythmias     08/2010 Echo:  Left ventricle: The cavity size was normal. Wall thickness was   normal. Systolic function was normal. The estimated ejection   fraction was in the range of 60% to 65%. Wall motion was normal;   there were no regional wall motion abnormalities. The study is not   technically sufficient to allow evaluation of LV diastolic   function.   - Aortic valve: Mildly calcified annulus. Mild regurgitation.   - Mitral valve: Mild regurgitation.   - Tricuspid valve: Mild regurgitation.   - Pericardium, extracardiac: There was no pericardial effusion.     11/14/13 Clinic EKG   Sinus rhythm     10/2013 Echo    LVEF 60-65%, grade II diastolic dysfunction, mild AI, mild MR     01/23/14 Clinic EKG   NSR, LAE, no ischemic changes   01/2021 echo IMPRESSIONS     1. Left ventricular ejection fraction, by estimation, is 60 to 65%. The  left ventricle has normal function. The left ventricle has no regional  wall motion abnormalities. There is moderate concentric left ventricular  hypertrophy. Left ventricular  diastolic parameters are indeterminate. Elevated left ventricular  end-diastolic pressure.   2. Right ventricular systolic function is normal. The right ventricular  size is normal. Tricuspid regurgitation signal is inadequate for assessing  PA pressure.   3. The mitral valve is normal in structure. Trivial mitral valve  regurgitation. No evidence of mitral stenosis.   4. The aortic valve is calcified. Aortic valve regurgitation is mild.  Mild to moderate aortic valve sclerosis/calcification is present, without  any evidence of aortic stenosis. Focal dense calcification of the left  coornary cusp is present. Aortic  regurgitation PHT measures 543 msec.   5. The inferior vena cava is dilated in size with <50% respiratory  variability, suggesting right atrial pressure of 15 mmHg.   01/2021 cath 1. Widely patent coronary arteries with no obstructive CAD 2. Normal LV function     Assessment and Plan   1. Chest pain - negative cath earlier this year - some intermittent symptoms at times, often with laying down. Will try changing omeprazole to protonix '40mg'$  daily.    2. Hyperlipidemia   - given prior CVA goal LDL would be <70 - LDL remains above goal on crestor '40mg'$  daily, add zetia '10mg'$  daily.    3. Chronic diastolic heart failure   -apears euvoloemic. Frequent muscle cramps, will try lowering lasix to '20mg'$  daily. - check bmet/mg on diuretic.     4. HTN - above goal, add aldactone '25mg'$  daily Check bmet/mg in 2 weeks     Hermione Havlicek F.  Harl Bowie, M.D., F.A.C.C.

## 2022-09-27 ENCOUNTER — Encounter: Payer: Self-pay | Admitting: Cardiology

## 2022-09-30 DIAGNOSIS — F17210 Nicotine dependence, cigarettes, uncomplicated: Secondary | ICD-10-CM | POA: Diagnosis not present

## 2022-09-30 DIAGNOSIS — K219 Gastro-esophageal reflux disease without esophagitis: Secondary | ICD-10-CM | POA: Diagnosis not present

## 2022-09-30 DIAGNOSIS — J449 Chronic obstructive pulmonary disease, unspecified: Secondary | ICD-10-CM | POA: Diagnosis not present

## 2022-09-30 DIAGNOSIS — E119 Type 2 diabetes mellitus without complications: Secondary | ICD-10-CM | POA: Diagnosis not present

## 2022-09-30 DIAGNOSIS — Z7982 Long term (current) use of aspirin: Secondary | ICD-10-CM | POA: Diagnosis not present

## 2022-09-30 DIAGNOSIS — Z886 Allergy status to analgesic agent status: Secondary | ICD-10-CM | POA: Diagnosis not present

## 2022-09-30 DIAGNOSIS — Z794 Long term (current) use of insulin: Secondary | ICD-10-CM | POA: Diagnosis not present

## 2022-09-30 DIAGNOSIS — R000 Tachycardia, unspecified: Secondary | ICD-10-CM | POA: Diagnosis not present

## 2022-09-30 DIAGNOSIS — R1110 Vomiting, unspecified: Secondary | ICD-10-CM | POA: Diagnosis not present

## 2022-09-30 DIAGNOSIS — I10 Essential (primary) hypertension: Secondary | ICD-10-CM | POA: Diagnosis not present

## 2022-09-30 DIAGNOSIS — R059 Cough, unspecified: Secondary | ICD-10-CM | POA: Diagnosis not present

## 2022-09-30 DIAGNOSIS — R072 Precordial pain: Secondary | ICD-10-CM | POA: Diagnosis not present

## 2022-09-30 DIAGNOSIS — R0789 Other chest pain: Secondary | ICD-10-CM | POA: Diagnosis not present

## 2022-09-30 DIAGNOSIS — R079 Chest pain, unspecified: Secondary | ICD-10-CM | POA: Diagnosis not present

## 2022-09-30 DIAGNOSIS — Z880 Allergy status to penicillin: Secondary | ICD-10-CM | POA: Diagnosis not present

## 2022-10-02 ENCOUNTER — Other Ambulatory Visit: Payer: Self-pay | Admitting: Cardiology

## 2022-10-04 DIAGNOSIS — L272 Dermatitis due to ingested food: Secondary | ICD-10-CM | POA: Diagnosis not present

## 2022-10-04 DIAGNOSIS — Z6839 Body mass index (BMI) 39.0-39.9, adult: Secondary | ICD-10-CM | POA: Diagnosis not present

## 2022-10-04 DIAGNOSIS — I209 Angina pectoris, unspecified: Secondary | ICD-10-CM | POA: Diagnosis not present

## 2022-10-19 ENCOUNTER — Other Ambulatory Visit: Payer: Self-pay | Admitting: Cardiology

## 2022-11-03 DIAGNOSIS — J40 Bronchitis, not specified as acute or chronic: Secondary | ICD-10-CM | POA: Diagnosis not present

## 2022-11-03 DIAGNOSIS — L272 Dermatitis due to ingested food: Secondary | ICD-10-CM | POA: Diagnosis not present

## 2022-11-03 DIAGNOSIS — E7849 Other hyperlipidemia: Secondary | ICD-10-CM | POA: Diagnosis not present

## 2022-11-03 DIAGNOSIS — E1143 Type 2 diabetes mellitus with diabetic autonomic (poly)neuropathy: Secondary | ICD-10-CM | POA: Diagnosis not present

## 2022-11-03 DIAGNOSIS — Z8673 Personal history of transient ischemic attack (TIA), and cerebral infarction without residual deficits: Secondary | ICD-10-CM | POA: Diagnosis not present

## 2022-11-03 DIAGNOSIS — I209 Angina pectoris, unspecified: Secondary | ICD-10-CM | POA: Diagnosis not present

## 2022-11-03 DIAGNOSIS — Z6839 Body mass index (BMI) 39.0-39.9, adult: Secondary | ICD-10-CM | POA: Diagnosis not present

## 2022-11-03 DIAGNOSIS — E669 Obesity, unspecified: Secondary | ICD-10-CM | POA: Diagnosis not present

## 2022-11-03 DIAGNOSIS — Z0000 Encounter for general adult medical examination without abnormal findings: Secondary | ICD-10-CM | POA: Diagnosis not present

## 2022-11-03 DIAGNOSIS — E0843 Diabetes mellitus due to underlying condition with diabetic autonomic (poly)neuropathy: Secondary | ICD-10-CM | POA: Diagnosis not present

## 2022-11-10 ENCOUNTER — Other Ambulatory Visit: Payer: Self-pay | Admitting: Cardiology

## 2022-12-10 ENCOUNTER — Other Ambulatory Visit: Payer: Self-pay | Admitting: Cardiology

## 2022-12-22 DIAGNOSIS — M810 Age-related osteoporosis without current pathological fracture: Secondary | ICD-10-CM | POA: Diagnosis not present

## 2022-12-22 DIAGNOSIS — Z780 Asymptomatic menopausal state: Secondary | ICD-10-CM | POA: Diagnosis not present

## 2023-01-04 ENCOUNTER — Other Ambulatory Visit: Payer: Self-pay | Admitting: Cardiology

## 2023-01-07 ENCOUNTER — Telehealth: Payer: Self-pay | Admitting: Cardiology

## 2023-01-07 MED ORDER — ROSUVASTATIN CALCIUM 40 MG PO TABS: ORAL_TABLET | ORAL | 0 refills | Status: DC

## 2023-01-07 NOTE — Telephone Encounter (Signed)
*  STAT* If patient is at the pharmacy, call can be transferred to refill team.   1. Which medications need to be refilled? (please list name of each medication and dose if known) rosuvastatin (CRESTOR) 40 MG tablet   2. Which pharmacy/location (including street and city if local pharmacy) is medication to be sent to? [facility] - [place], [place] - [place] [address]   3. Do they need a 30 day or 90 day supply? [place]

## 2023-01-19 ENCOUNTER — Ambulatory Visit: Payer: 59 | Attending: Cardiology | Admitting: Cardiology

## 2023-01-19 ENCOUNTER — Encounter: Payer: Self-pay | Admitting: Cardiology

## 2023-01-19 ENCOUNTER — Encounter: Payer: Self-pay | Admitting: *Deleted

## 2023-01-19 VITALS — BP 144/70 | HR 70 | Ht 67.0 in | Wt 268.8 lb

## 2023-01-19 DIAGNOSIS — I5032 Chronic diastolic (congestive) heart failure: Secondary | ICD-10-CM | POA: Diagnosis not present

## 2023-01-19 DIAGNOSIS — R0789 Other chest pain: Secondary | ICD-10-CM

## 2023-01-19 DIAGNOSIS — I10 Essential (primary) hypertension: Secondary | ICD-10-CM

## 2023-01-19 DIAGNOSIS — E782 Mixed hyperlipidemia: Secondary | ICD-10-CM | POA: Diagnosis not present

## 2023-01-19 MED ORDER — FUROSEMIDE 20 MG PO TABS: 20.0000 mg | ORAL_TABLET | Freq: Every day | ORAL | 1 refills | Status: DC | PRN

## 2023-01-19 MED ORDER — EZETIMIBE 10 MG PO TABS: 10.0000 mg | ORAL_TABLET | Freq: Every day | ORAL | 3 refills | Status: DC

## 2023-01-19 NOTE — Patient Instructions (Addendum)
Medication Instructions:  Your physician has recommended you make the following change in your medication:  Start zetia 10 mg daily Change furosemide 20 mg to daily as needed for swelling Continue other medications the same  Labwork: none  Testing/Procedures: none  Follow-Up: Your physician recommends that you schedule a follow-up appointment in: 6 months Your physician recommends that you schedule a follow-up appointment in: 1 week for a nurse visit  Any Other Special Instructions Will Be Listed Below (If Applicable).  If you need a refill on your cardiac medications before your next appointment, please call your pharmacy.

## 2023-01-19 NOTE — Progress Notes (Signed)
Clinical Summary Amber Harris is a 62 y.o.female seen today for follow up of the following medical problems.      1. HTN   - has not taken meds yet today.      2. Hyperlipidemia   - she is on crestor 62m daily.  - 10/2022 TC 166 TG 86 HDL 64 LDL 86     4. Chronic diastolic HF  -occasional LE edema    [facility]. [facility] per her report - residual left sided weakness   [facility]   - suspecte TIA during 01/2021 admission        6. Chest pain -01/2021 admission with chest pain - 01/2021 cath without significant disease - still with chest pains at times - sharp pain at times midchest, worst with deep breathing. Lasted about 4.5 hours. Can awake from sleep.   -mild chest pains at time  6. Carotid stenosis - carotid [id number] LICA no stenosis Past Medical History:  Diagnosis Date   Aortic valve disease    Long-standing systolic murmur   Asthma    Uses p.r.n. albuterol   Bronchitis    COPD (chronic obstructive pulmonary disease) (HCC)    Degenerative joint disease    right knee   Depression    Depression with anxiety    Diabetes mellitus    Dyspnea    exertion   Dyspnea on exertion    poor exercise tolerance   Fibroids    uterine; postmenopausal bleeding   GERD (gastroesophageal reflux disease)    Headache(784.0)    Hyperlipidemia    Hypertension    [place]    Obesity    Palpitations    [place] ([place]    TIA - left side residual   Syncope    exertional   Tobacco user    30 pack years; 04/2011: 1/4 pack per day during quick attempt     Allergies  Allergen Reactions   Aspirin Itching    States that she takes aspirin regularly without significant reaction.   Penicillins Rash     Current Outpatient Medications  Medication Sig Dispense Refill   albuterol (PROVENTIL) (5 MG/ML) 0.5% nebulizer solution Take 2.5 mg by  nebulization 3 (three) times daily as needed for wheezing or shortness of breath.      albuterol (PROVENTIL,VENTOLIN) 90 MCG/ACT inhaler Inhale 2 puffs into the lungs every 6 (six) hours as needed for wheezing or shortness of breath.      amLODipine (NORVASC) 10 MG tablet Take 1 tablet (10 mg total) by mouth daily. 90 tablet 2   aspirin EC 81 MG tablet Take 81 mg by mouth daily.     benazepril (LOTENSIN) 40 MG tablet TAKE 1 TABLET EVERY DAY (NEED MD APPOINTMENT) 7 tablet 0   chlorthalidone (HYGROTON) 50 MG tablet Take 50 mg by mouth daily.     cloNIDine (CATAPRES) 0.2 MG tablet Take 1 tablet (0.2 mg total) by mouth 3 (three) times daily. 90 tablet 2   estradiol (ESTRACE) 1 MG tablet Take 1 mg by mouth daily.     Fluticasone-Umeclidin-Vilant (TRELEGY ELLIPTA) 100-62.5-25 MCG/INH AEPB Inhale 1 puff into the lungs daily.     furosemide (LASIX) 20 MG tablet Take 1 tablet (20 mg total) by mouth daily. 90 tablet 1   insulin aspart (NOVOLOG) 100 UNIT/ML injection Inject 5 Units  into the skin 3 (three) times daily after meals.     insulin degludec (TRESIBA FLEXTOUCH) 100 UNIT/ML FlexTouch Pen Inject 20 Units into the skin at bedtime.     JANUMET 50-500 MG tablet Take 1 tablet by mouth 2 (two) times daily.     JARDIANCE 25 MG TABS tablet Take 25 mg by mouth daily.      methocarbamol (ROBAXIN) 500 MG tablet Take 500 mg by mouth at bedtime.     Multiple Vitamins-Minerals (MULTIVITAMIN ADULT) CHEW Chew 1 tablet by mouth daily.     pantoprazole (PROTONIX) 40 MG tablet Take 1 tablet (40 mg total) by mouth daily. 90 tablet 1   risperiDONE (RISPERDAL) 0.5 MG tablet Take 0.5 mg by mouth at bedtime.     rosuvastatin (CRESTOR) 40 MG tablet TAKE ONE TABLET BY MOUTH AT bedtime Needs appointment for further refills 7 tablet 0   spironolactone (ALDACTONE) 25 MG tablet Take 1 tablet (25 mg total) by mouth daily. 90 tablet 1   topiramate (TOPAMAX) 50 MG tablet Take 50 mg by mouth 2 (two) times daily as needed  (headaches).     No current facility-administered medications for this visit.     Past Surgical History:  Procedure Laterality Date   ABDOMINAL HYSTERECTOMY  12/21/2011   Procedure: HYSTERECTOMY ABDOMINAL;  Surgeon: Jonnie Kind, MD;  Location: [facility];  Service: Gynecology;  Laterality: N/A;   CATARACT EXTRACTION W/PHACO Left 10/03/2020   Procedure: CATARACT EXTRACTION PHACO AND INTRAOCULAR LENS PLACEMENT LEFT EYE;  Surgeon: Baruch Goldmann, MD;  Location: [facility];  Service: Ophthalmology;  Laterality: Left;  CDE 5.04   CATARACT EXTRACTION W/PHACO Right 10/17/2020   Procedure: CATARACT EXTRACTION PHACO AND INTRAOCULAR LENS PLACEMENT RIGHT EYE;  Surgeon: Baruch Goldmann, MD;  Location: [facility];  Service: Ophthalmology;  Laterality: Right;  CDE: 4.60   LEFT HEART CATH AND CORONARY ANGIOGRAPHY N/A 02/17/2021   Procedure: LEFT HEART CATH AND CORONARY ANGIOGRAPHY;  Surgeon: Sherren Mocha, MD;  Location: [place] [facility];  Service: Cardiovascular;  Laterality: N/A;   TUBAL LIGATION       Allergies  Allergen Reactions   Aspirin Itching    States that she takes aspirin regularly without significant reaction.   Penicillins Rash      Family History  Problem Relation Age of Onset   Lung cancer Mother    Heart disease Brother    Breast cancer Sister    Heart disease Maternal Aunt    Cancer Cousin        lung   Anesthesia problems Neg Hx    Hypotension Neg Hx    Malignant hyperthermia Neg Hx    Pseudochol deficiency Neg Hx      Social History Amber Harris reports that she has been smoking cigarettes. She started smoking about 48 years ago. She has a 15.00 pack-year smoking history. She has never used smokeless tobacco. Amber Harris reports no history of alcohol use.   Review of Systems CONSTITUTIONAL: No weight loss, fever, chills, weakness or fatigue.  HEENT: Eyes: No visual loss, blurred vision, double vision or yellow sclerae.No hearing loss, sneezing, congestion, runny nose or  sore throat.  SKIN: No rash or itching.  CARDIOVASCULAR: per hpi RESPIRATORY: No shortness of breath, cough or sputum.  GASTROINTESTINAL: No anorexia, nausea, vomiting or diarrhea. No abdominal pain or blood.  GENITOURINARY: No burning on urination, no polyuria NEUROLOGICAL: No headache, dizziness, syncope, paralysis, ataxia, numbness or tingling in the extremities. No change in bowel or bladder control.  MUSCULOSKELETAL: No muscle, back pain, joint pain or stiffness.  LYMPHATICS: No enlarged nodes. No history of splenectomy.  PSYCHIATRIC: No history of depression or anxiety.  ENDOCRINOLOGIC: No reports of sweating, cold or heat intolerance. No polyuria or polydipsia.  Marland Kitchen   Physical Examination Today's Vitals   01/19/23 0831  BP: (!) 140/60  Pulse: 70  SpO2: 97%  Weight: 268 lb 12.8 oz (121.9 kg)  Height: 5' 7"$  (1.702 m)   Body mass index is 42.1 kg/m.  Gen: resting comfortably, no acute distress HEENT: no scleral icterus, pupils equal round and reactive, no palptable cervical adenopathy,  CV: RRR, no m/rg, no jvd Resp: Clear to auscultation bilaterally GI: abdomen is soft, non-tender, non-distended, normal bowel sounds, no hepatosplenomegaly MSK: extremities are warm, no edema.  Skin: warm, no rash Neuro:  no focal deficits Psych: appropriate affect   Diagnostic Studies  10/2012 Event monitor: no arrhythmias     08/2010 Echo:  Left ventricle: The cavity size was normal. Wall thickness was   normal. Systolic function was normal. The estimated ejection   fraction was in the range of 60% to 65%. Wall motion was normal;   there were no regional wall motion abnormalities. The study is not   technically sufficient to allow evaluation of LV diastolic   function.   - Aortic valve: Mildly calcified annulus. Mild regurgitation.   - Mitral valve: Mild regurgitation.   - Tricuspid valve: Mild regurgitation.   - Pericardium, extracardiac: There was no pericardial effusion.      11/14/13 Clinic EKG   Sinus rhythm     10/2013 Echo   LVEF 60-65%, grade II diastolic dysfunction, mild AI, mild MR     01/23/14 Clinic EKG   NSR, LAE, no ischemic changes   01/2021 echo IMPRESSIONS     1. Left ventricular ejection fraction, by estimation, is 60 to 65%. The  left ventricle has normal function. The left ventricle has no regional  wall motion abnormalities. There is moderate concentric left ventricular  hypertrophy. Left ventricular  diastolic parameters are indeterminate. Elevated left ventricular  end-diastolic pressure.   2. Right ventricular systolic function is normal. The right ventricular  size is normal. Tricuspid regurgitation signal is inadequate for assessing  PA pressure.   3. The mitral valve is normal in structure. Trivial mitral valve  regurgitation. No evidence of mitral stenosis.   4. The aortic valve is calcified. Aortic valve regurgitation is mild.  Mild to moderate aortic valve sclerosis/calcification is present, without  any evidence of aortic stenosis. Focal dense calcification of the left  coornary cusp is present. Aortic  regurgitation PHT measures 543 msec.   5. The inferior vena cava is dilated in size with <50% respiratory  variability, suggesting right atrial pressure of 15 mmHg.   01/2021 cath 1. Widely patent coronary arteries with no obstructive CAD 2. Normal LV function     Assessment and Plan  1. Chest pain - negative cath earlier this year -chronic intermittent noncardiac pains, no further cardiac testing is indicated.    2. Hyperlipidemia   - given prior CVA goal LDL would be <70 - LDL is above goal, add zetia 18m daily.    3. Chronic diastolic heart failure   -euvolemic today, change lasix to 221mprn since on daily high dose chlorthalidone.    4. HTN -above goal but has not taken meds, come back 1-2 weeks for nursing bp check - she is on max dose norvasc, chlrothaldione, aldactone, benazepril. Consider hydralazine  as next agent.       Arnoldo Lenis, M.D.

## 2023-01-26 ENCOUNTER — Telehealth: Payer: Self-pay | Admitting: Cardiology

## 2023-01-26 ENCOUNTER — Ambulatory Visit: Payer: 59

## 2023-01-26 NOTE — Telephone Encounter (Signed)
Patient needs a call back to get her nurse visit reschd, patient had to cancel appt for today. Please advise

## 2023-02-01 ENCOUNTER — Ambulatory Visit: Payer: 59 | Attending: Internal Medicine | Admitting: *Deleted

## 2023-02-01 ENCOUNTER — Telehealth: Payer: Self-pay | Admitting: *Deleted

## 2023-02-01 ENCOUNTER — Encounter: Payer: Self-pay | Admitting: *Deleted

## 2023-02-01 VITALS — BP 140/83 | HR 78 | Ht 67.0 in | Wt 266.4 lb

## 2023-02-01 DIAGNOSIS — I1A0 Resistant hypertension: Secondary | ICD-10-CM

## 2023-02-01 DIAGNOSIS — I10 Essential (primary) hypertension: Secondary | ICD-10-CM

## 2023-02-01 MED ORDER — HYDRALAZINE HCL 25 MG PO TABS: 25.0000 mg | ORAL_TABLET | Freq: Two times a day (BID) | ORAL | 1 refills | Status: DC

## 2023-02-01 NOTE — Progress Notes (Signed)
Patient informed and verbalized understanding of plan. Hydralazine 25 mg rx sent to [facility] to schedulers to arrange nurse visit and echo on same day

## 2023-02-01 NOTE — Patient Instructions (Signed)
Your physician recommends that you continue on your current medications as directed. Please refer to the Current Medication list given to you today. We will contact you with any changes.  

## 2023-02-01 NOTE — Progress Notes (Signed)
BP too high, can she start hydralazine '25mg'$  bid and repeat bp check 2 weeks. Very difficult to control bp, need to look for some potential causes. Can she get a renal artery [place] for resistant hypertension please   Zandra Abts MD

## 2023-02-01 NOTE — Telephone Encounter (Addendum)
  BP too high, can she start hydralazine '25mg'$  bid and repeat bp check 2 weeks. Very difficult to control bp, need to look for some potential causes. Can she get a renal artery [place] for resistant hypertension please     Zandra Abts MD

## 2023-02-01 NOTE — Telephone Encounter (Signed)
Patient informed and verbalized understanding of plan. Hydralazine 25 mg rx sent to [facility] to schedulers to arrange nurse visit and echo on same day

## 2023-02-01 NOTE — Progress Notes (Signed)
Presents to office for nurse visit to have BP checked per recent OV 4. HTN -above goal but has not taken meds, come back 1-2 weeks for nursing bp check - she is on max dose norvasc, chlrothaldione, aldactone, benazepril. Consider hydralazine as next agent.  Medications reviewed Reports taking all medications as prescribed without missing doses. Denies dizziness chest pain or sob. Vitals taken and routed to provider for review.

## 2023-02-02 ENCOUNTER — Other Ambulatory Visit: Payer: Self-pay | Admitting: Cardiology

## 2023-02-22 DIAGNOSIS — E7849 Other hyperlipidemia: Secondary | ICD-10-CM | POA: Diagnosis not present

## 2023-02-22 DIAGNOSIS — E1143 Type 2 diabetes mellitus with diabetic autonomic (poly)neuropathy: Secondary | ICD-10-CM | POA: Diagnosis not present

## 2023-02-22 DIAGNOSIS — Z0000 Encounter for general adult medical examination without abnormal findings: Secondary | ICD-10-CM | POA: Diagnosis not present

## 2023-02-22 DIAGNOSIS — M5459 Other low back pain: Secondary | ICD-10-CM | POA: Diagnosis not present

## 2023-02-22 DIAGNOSIS — Z8673 Personal history of transient ischemic attack (TIA), and cerebral infarction without residual deficits: Secondary | ICD-10-CM | POA: Diagnosis not present

## 2023-02-23 ENCOUNTER — Encounter: Payer: Self-pay | Admitting: *Deleted

## 2023-02-23 ENCOUNTER — Ambulatory Visit: Payer: 59 | Attending: Cardiology

## 2023-02-23 ENCOUNTER — Ambulatory Visit (INDEPENDENT_AMBULATORY_CARE_PROVIDER_SITE_OTHER): Payer: 59 | Admitting: *Deleted

## 2023-02-23 VITALS — BP 128/82 | HR 59 | Ht 67.0 in | Wt 265.6 lb

## 2023-02-23 DIAGNOSIS — I1A0 Resistant hypertension: Secondary | ICD-10-CM

## 2023-02-23 DIAGNOSIS — I10 Essential (primary) hypertension: Secondary | ICD-10-CM | POA: Diagnosis not present

## 2023-02-23 NOTE — Progress Notes (Signed)
Presents to office for nurse visit to have BP rechecked per last nurse visit and medication change of starting hydralazine 25 mg BID per [place]. Hydralazine 25 mg delivered on 02/07/2023 per [facility]. Medications are prepackaged by [facility]. Medication package list brought to office for review. Copy of list will be scanned to chart Medications reviewed and it was discovered that medication profile needed several updates. [facility] contacted, spoke with Tye [place] to confirm.  Clopidogrel started by PCP Amlodipine or spironolactone never filled by [facility] Benazepril 40 mg last filled 07/28/2022 and was removed when PCP sent losartan 100 mg [facility] also contacted, spoke with Hailey who says amlodipine last filled 05/14/2021 and spironolactone never filled. Reports taking all medications as prescribed without missing doses. Denies dizziness chest pain or sob. Vitals taken and routed to provider for review.

## 2023-02-23 NOTE — Progress Notes (Signed)
Patient advised during visit to bring all pill packages to every visit.

## 2023-02-23 NOTE — Patient Instructions (Signed)
Your physician recommends that you continue on your current medications as directed. Please refer to the Current Medication list given to you today. We will contact you with any changes.  

## 2023-02-23 NOTE — Progress Notes (Signed)
Thx for update on meds. Bp looks fine today, we will continue current meds for now. Ask her to bring pill bottles every visit   Zandra Abts MD

## 2023-03-08 ENCOUNTER — Telehealth: Payer: Self-pay | Admitting: *Deleted

## 2023-03-08 NOTE — Telephone Encounter (Signed)
-----   Message from Antoine Poche, MD sent at 03/07/2023  3:30 PM EDT ----- No significant blockages in the arteries of the kidneys  Dominga Ferry MD

## 2023-03-08 NOTE — Telephone Encounter (Signed)
Lesle Chris, LPN 07/05/5642  3:29 PM EDT Back to Top    Notified, copy to pcp.

## 2023-04-19 DIAGNOSIS — Z5320 Procedure and treatment not carried out because of patient's decision for unspecified reasons: Secondary | ICD-10-CM | POA: Diagnosis not present

## 2023-04-19 DIAGNOSIS — R7303 Prediabetes: Secondary | ICD-10-CM | POA: Diagnosis not present

## 2023-05-24 DIAGNOSIS — E1143 Type 2 diabetes mellitus with diabetic autonomic (poly)neuropathy: Secondary | ICD-10-CM | POA: Diagnosis not present

## 2023-05-24 DIAGNOSIS — Z8673 Personal history of transient ischemic attack (TIA), and cerebral infarction without residual deficits: Secondary | ICD-10-CM | POA: Diagnosis not present

## 2023-05-24 DIAGNOSIS — E7849 Other hyperlipidemia: Secondary | ICD-10-CM | POA: Diagnosis not present

## 2023-05-24 DIAGNOSIS — Z0000 Encounter for general adult medical examination without abnormal findings: Secondary | ICD-10-CM | POA: Diagnosis not present

## 2023-05-24 DIAGNOSIS — M5459 Other low back pain: Secondary | ICD-10-CM | POA: Diagnosis not present

## 2023-05-31 DIAGNOSIS — R531 Weakness: Secondary | ICD-10-CM | POA: Diagnosis not present

## 2023-05-31 DIAGNOSIS — I69354 Hemiplegia and hemiparesis following cerebral infarction affecting left non-dominant side: Secondary | ICD-10-CM | POA: Diagnosis not present

## 2023-05-31 DIAGNOSIS — Z743 Need for continuous supervision: Secondary | ICD-10-CM | POA: Diagnosis not present

## 2023-05-31 DIAGNOSIS — E119 Type 2 diabetes mellitus without complications: Secondary | ICD-10-CM | POA: Diagnosis not present

## 2023-05-31 DIAGNOSIS — I6523 Occlusion and stenosis of bilateral carotid arteries: Secondary | ICD-10-CM | POA: Diagnosis not present

## 2023-05-31 DIAGNOSIS — I351 Nonrheumatic aortic (valve) insufficiency: Secondary | ICD-10-CM | POA: Diagnosis not present

## 2023-05-31 DIAGNOSIS — I69054 Hemiplegia and hemiparesis following nontraumatic subarachnoid hemorrhage affecting left non-dominant side: Secondary | ICD-10-CM | POA: Diagnosis not present

## 2023-05-31 DIAGNOSIS — Z7902 Long term (current) use of antithrombotics/antiplatelets: Secondary | ICD-10-CM | POA: Diagnosis not present

## 2023-05-31 DIAGNOSIS — Z880 Allergy status to penicillin: Secondary | ICD-10-CM | POA: Diagnosis not present

## 2023-05-31 DIAGNOSIS — I252 Old myocardial infarction: Secondary | ICD-10-CM | POA: Diagnosis not present

## 2023-05-31 DIAGNOSIS — R059 Cough, unspecified: Secondary | ICD-10-CM | POA: Diagnosis not present

## 2023-05-31 DIAGNOSIS — R569 Unspecified convulsions: Secondary | ICD-10-CM | POA: Diagnosis not present

## 2023-05-31 DIAGNOSIS — I6389 Other cerebral infarction: Secondary | ICD-10-CM | POA: Diagnosis not present

## 2023-05-31 DIAGNOSIS — I10 Essential (primary) hypertension: Secondary | ICD-10-CM | POA: Diagnosis not present

## 2023-05-31 DIAGNOSIS — G8194 Hemiplegia, unspecified affecting left nondominant side: Secondary | ICD-10-CM | POA: Diagnosis not present

## 2023-05-31 DIAGNOSIS — Z79899 Other long term (current) drug therapy: Secondary | ICD-10-CM | POA: Diagnosis not present

## 2023-05-31 DIAGNOSIS — R29898 Other symptoms and signs involving the musculoskeletal system: Secondary | ICD-10-CM | POA: Diagnosis not present

## 2023-05-31 DIAGNOSIS — J449 Chronic obstructive pulmonary disease, unspecified: Secondary | ICD-10-CM | POA: Diagnosis not present

## 2023-05-31 DIAGNOSIS — Z886 Allergy status to analgesic agent status: Secondary | ICD-10-CM | POA: Diagnosis not present

## 2023-05-31 DIAGNOSIS — I700 Atherosclerosis of aorta: Secondary | ICD-10-CM | POA: Diagnosis not present

## 2023-05-31 DIAGNOSIS — Z794 Long term (current) use of insulin: Secondary | ICD-10-CM | POA: Diagnosis not present

## 2023-05-31 DIAGNOSIS — I2510 Atherosclerotic heart disease of native coronary artery without angina pectoris: Secondary | ICD-10-CM | POA: Diagnosis not present

## 2023-05-31 DIAGNOSIS — R4781 Slurred speech: Secondary | ICD-10-CM | POA: Diagnosis not present

## 2023-05-31 DIAGNOSIS — G9389 Other specified disorders of brain: Secondary | ICD-10-CM | POA: Diagnosis not present

## 2023-05-31 DIAGNOSIS — F17210 Nicotine dependence, cigarettes, uncomplicated: Secondary | ICD-10-CM | POA: Diagnosis not present

## 2023-05-31 DIAGNOSIS — Z7982 Long term (current) use of aspirin: Secondary | ICD-10-CM | POA: Diagnosis not present

## 2023-05-31 DIAGNOSIS — Z8673 Personal history of transient ischemic attack (TIA), and cerebral infarction without residual deficits: Secondary | ICD-10-CM | POA: Diagnosis not present

## 2023-05-31 DIAGNOSIS — I639 Cerebral infarction, unspecified: Secondary | ICD-10-CM | POA: Diagnosis not present

## 2023-06-08 DIAGNOSIS — I639 Cerebral infarction, unspecified: Secondary | ICD-10-CM | POA: Diagnosis not present

## 2023-06-08 DIAGNOSIS — R29898 Other symptoms and signs involving the musculoskeletal system: Secondary | ICD-10-CM | POA: Diagnosis not present

## 2023-06-09 DIAGNOSIS — J449 Chronic obstructive pulmonary disease, unspecified: Secondary | ICD-10-CM | POA: Diagnosis not present

## 2023-06-09 DIAGNOSIS — E119 Type 2 diabetes mellitus without complications: Secondary | ICD-10-CM | POA: Diagnosis not present

## 2023-06-09 DIAGNOSIS — Z794 Long term (current) use of insulin: Secondary | ICD-10-CM | POA: Diagnosis not present

## 2023-06-09 DIAGNOSIS — I10 Essential (primary) hypertension: Secondary | ICD-10-CM | POA: Diagnosis not present

## 2023-06-09 DIAGNOSIS — I69952 Hemiplegia and hemiparesis following unspecified cerebrovascular disease affecting left dominant side: Secondary | ICD-10-CM | POA: Diagnosis not present

## 2023-06-13 DIAGNOSIS — I10 Essential (primary) hypertension: Secondary | ICD-10-CM | POA: Diagnosis not present

## 2023-06-13 DIAGNOSIS — G458 Other transient cerebral ischemic attacks and related syndromes: Secondary | ICD-10-CM | POA: Diagnosis not present

## 2023-06-14 DIAGNOSIS — E119 Type 2 diabetes mellitus without complications: Secondary | ICD-10-CM | POA: Diagnosis not present

## 2023-06-14 DIAGNOSIS — J449 Chronic obstructive pulmonary disease, unspecified: Secondary | ICD-10-CM | POA: Diagnosis not present

## 2023-06-14 DIAGNOSIS — Z794 Long term (current) use of insulin: Secondary | ICD-10-CM | POA: Diagnosis not present

## 2023-06-14 DIAGNOSIS — I69952 Hemiplegia and hemiparesis following unspecified cerebrovascular disease affecting left dominant side: Secondary | ICD-10-CM | POA: Diagnosis not present

## 2023-06-17 DIAGNOSIS — E119 Type 2 diabetes mellitus without complications: Secondary | ICD-10-CM | POA: Diagnosis not present

## 2023-06-17 DIAGNOSIS — Z794 Long term (current) use of insulin: Secondary | ICD-10-CM | POA: Diagnosis not present

## 2023-06-17 DIAGNOSIS — I69952 Hemiplegia and hemiparesis following unspecified cerebrovascular disease affecting left dominant side: Secondary | ICD-10-CM | POA: Diagnosis not present

## 2023-06-17 DIAGNOSIS — J449 Chronic obstructive pulmonary disease, unspecified: Secondary | ICD-10-CM | POA: Diagnosis not present

## 2023-06-19 DIAGNOSIS — Z743 Need for continuous supervision: Secondary | ICD-10-CM | POA: Diagnosis not present

## 2023-06-19 DIAGNOSIS — F17210 Nicotine dependence, cigarettes, uncomplicated: Secondary | ICD-10-CM | POA: Diagnosis not present

## 2023-06-19 DIAGNOSIS — R6889 Other general symptoms and signs: Secondary | ICD-10-CM | POA: Diagnosis not present

## 2023-06-19 DIAGNOSIS — Z7984 Long term (current) use of oral hypoglycemic drugs: Secondary | ICD-10-CM | POA: Diagnosis not present

## 2023-06-19 DIAGNOSIS — Z886 Allergy status to analgesic agent status: Secondary | ICD-10-CM | POA: Diagnosis not present

## 2023-06-19 DIAGNOSIS — R110 Nausea: Secondary | ICD-10-CM | POA: Diagnosis not present

## 2023-06-19 DIAGNOSIS — R404 Transient alteration of awareness: Secondary | ICD-10-CM | POA: Diagnosis not present

## 2023-06-19 DIAGNOSIS — R0789 Other chest pain: Secondary | ICD-10-CM | POA: Diagnosis not present

## 2023-06-19 DIAGNOSIS — Z79899 Other long term (current) drug therapy: Secondary | ICD-10-CM | POA: Diagnosis not present

## 2023-06-19 DIAGNOSIS — Z7982 Long term (current) use of aspirin: Secondary | ICD-10-CM | POA: Diagnosis not present

## 2023-06-19 DIAGNOSIS — Z8673 Personal history of transient ischemic attack (TIA), and cerebral infarction without residual deficits: Secondary | ICD-10-CM | POA: Diagnosis not present

## 2023-06-19 DIAGNOSIS — E876 Hypokalemia: Secondary | ICD-10-CM | POA: Diagnosis not present

## 2023-06-19 DIAGNOSIS — J449 Chronic obstructive pulmonary disease, unspecified: Secondary | ICD-10-CM | POA: Diagnosis not present

## 2023-06-19 DIAGNOSIS — Z880 Allergy status to penicillin: Secondary | ICD-10-CM | POA: Diagnosis not present

## 2023-06-19 DIAGNOSIS — E119 Type 2 diabetes mellitus without complications: Secondary | ICD-10-CM | POA: Diagnosis not present

## 2023-06-19 DIAGNOSIS — R739 Hyperglycemia, unspecified: Secondary | ICD-10-CM | POA: Diagnosis not present

## 2023-06-19 DIAGNOSIS — I10 Essential (primary) hypertension: Secondary | ICD-10-CM | POA: Diagnosis not present

## 2023-06-19 DIAGNOSIS — R42 Dizziness and giddiness: Secondary | ICD-10-CM | POA: Diagnosis not present

## 2023-06-22 DIAGNOSIS — E119 Type 2 diabetes mellitus without complications: Secondary | ICD-10-CM | POA: Diagnosis not present

## 2023-06-22 DIAGNOSIS — Z794 Long term (current) use of insulin: Secondary | ICD-10-CM | POA: Diagnosis not present

## 2023-06-22 DIAGNOSIS — J449 Chronic obstructive pulmonary disease, unspecified: Secondary | ICD-10-CM | POA: Diagnosis not present

## 2023-06-22 DIAGNOSIS — I69952 Hemiplegia and hemiparesis following unspecified cerebrovascular disease affecting left dominant side: Secondary | ICD-10-CM | POA: Diagnosis not present

## 2023-06-24 DIAGNOSIS — J449 Chronic obstructive pulmonary disease, unspecified: Secondary | ICD-10-CM | POA: Diagnosis not present

## 2023-06-24 DIAGNOSIS — I69952 Hemiplegia and hemiparesis following unspecified cerebrovascular disease affecting left dominant side: Secondary | ICD-10-CM | POA: Diagnosis not present

## 2023-06-24 DIAGNOSIS — Z794 Long term (current) use of insulin: Secondary | ICD-10-CM | POA: Diagnosis not present

## 2023-06-24 DIAGNOSIS — E119 Type 2 diabetes mellitus without complications: Secondary | ICD-10-CM | POA: Diagnosis not present

## 2023-06-27 DIAGNOSIS — E119 Type 2 diabetes mellitus without complications: Secondary | ICD-10-CM | POA: Diagnosis not present

## 2023-06-27 DIAGNOSIS — Z794 Long term (current) use of insulin: Secondary | ICD-10-CM | POA: Diagnosis not present

## 2023-06-27 DIAGNOSIS — I69952 Hemiplegia and hemiparesis following unspecified cerebrovascular disease affecting left dominant side: Secondary | ICD-10-CM | POA: Diagnosis not present

## 2023-06-27 DIAGNOSIS — J449 Chronic obstructive pulmonary disease, unspecified: Secondary | ICD-10-CM | POA: Diagnosis not present

## 2023-06-28 DIAGNOSIS — M5459 Other low back pain: Secondary | ICD-10-CM | POA: Diagnosis not present

## 2023-06-28 DIAGNOSIS — Z8673 Personal history of transient ischemic attack (TIA), and cerebral infarction without residual deficits: Secondary | ICD-10-CM | POA: Diagnosis not present

## 2023-06-28 DIAGNOSIS — N182 Chronic kidney disease, stage 2 (mild): Secondary | ICD-10-CM | POA: Diagnosis not present

## 2023-06-28 DIAGNOSIS — G458 Other transient cerebral ischemic attacks and related syndromes: Secondary | ICD-10-CM | POA: Diagnosis not present

## 2023-06-28 DIAGNOSIS — E1143 Type 2 diabetes mellitus with diabetic autonomic (poly)neuropathy: Secondary | ICD-10-CM | POA: Diagnosis not present

## 2023-06-28 DIAGNOSIS — E7849 Other hyperlipidemia: Secondary | ICD-10-CM | POA: Diagnosis not present

## 2023-06-29 ENCOUNTER — Encounter: Payer: Self-pay | Admitting: *Deleted

## 2023-06-29 ENCOUNTER — Telehealth: Payer: Self-pay | Admitting: *Deleted

## 2023-06-29 NOTE — Patient Outreach (Signed)
Care Coordination   Initial Visit Note   06/29/2023 Name: Amber Harris MRN: [id number] DOB: June 08, 1961  Amber Harris is a 62 y.o. year old female who sees Hasanaj, Myra Gianotti, MD for primary care. I spoke with  Amber Harris by phone today.  What matters to the patients health and wellness today?  Feeling better. Complains of intermittent chest pain, nausea, and general malaise for almost two weeks. ED visit on 06/19/23 for reassuring. Has followed up with PCP since ED visit. Scheduled with cardiology on 07/20/23.     Goals Addressed             This Visit's Progress    Care Coordination Services       Care Coordination Goals: Patient will keep all medical appointments Patient will take medications as prescribed Patient will continue to check and record blood pressure daily and reach out to provider with readings outside of recommended range Patient will continue to check and record blood sugar daily and reach out to provider with readings outside of recommended range Patient will seek medical attention for any new or worsening symptoms and emergency medical attention if needed Patient will reach out to RN Care Coordinator at [phone] with any resource or care coordination needs         SDOH assessments and interventions completed:  Yes  SDOH Interventions Today    Flowsheet Row Most Recent Value  SDOH Interventions   Food Insecurity Interventions Intervention Not Indicated  Housing Interventions Intervention Not Indicated  Transportation Interventions Payor Benefit, Patient Resources (Friends/Family)  [RCATs]  Utilities Interventions Intervention Not Indicated  Financial Strain Interventions Intervention Not Indicated        Care Coordination Interventions:  Yes, provided  Interventions Today    Flowsheet Row Most Recent Value  Chronic Disease   Chronic disease during today's visit Congestive Heart Failure (CHF), Diabetes  General Interventions    General Interventions Discussed/Reviewed General Interventions Discussed, General Interventions Reviewed, Durable Medical Equipment (DME), Doctor Visits, Communication with  Doctor Visits Discussed/Reviewed Doctor Visits Discussed, Doctor Visits Reviewed, PCP, Specialist  [reviewed and discussed ED visit on 06/19/23. Patient saw PCP for hospital f/u afterwards. has appt with cardiology scheduled for 07/20/23]  Durable Medical Equipment (DME) BP Cuff, Glucomoter  [blood pressure 144/84, Blood Sugar 117]  PCP/Specialist Visits Compliance with follow-up visit  Communication with PCP/Specialists  [Staff message to Dr Wyline Mood regarding recent ED visit for chest pain, nausea, and general malaise.]  Education Interventions   Education Provided Provided Education  Provided Verbal Education On When to see the doctor, Blood Sugar Monitoring, Community Resources  [provided information on Care Management Team, seek medical attention for new or worsening symptoms and emergency edical attention if needed, monitor & record blood pressure daily.]  Pharmacy Interventions   Pharmacy Dicussed/Reviewed Pharmacy Topics Discussed, Pharmacy Topics Reviewed  Safety Interventions   Safety Discussed/Reviewed Safety Discussed, Safety Reviewed       Follow up plan: Follow up call scheduled for 07/01/23    Encounter Outcome:  Pt. Visit Completed   Demetrios Loll, BSN, RN-BC RN Care Coordinator [facility]  [facility] Direct Dial: [phone] Main #: [phone]

## 2023-07-01 ENCOUNTER — Ambulatory Visit: Payer: Self-pay | Admitting: *Deleted

## 2023-07-01 NOTE — Patient Outreach (Signed)
Care Coordination   07/01/2023 Name: ELEONORE GUINDON MRN: [id number] DOB: 1961/06/30   Care Coordination Outreach Attempts:  An unsuccessful telephone outreach was attempted for a scheduled appointment today.  Follow Up Plan:  Additional outreach attempts will be made to offer the patient care coordination information and services.   Encounter Outcome:  No Answer   Care Coordination Interventions:  No, not indicated    Demetrios Loll, BSN, RN-BC RN Care Coordinator [facility]  [facility] Direct Dial: [phone] Main #: [phone]

## 2023-07-04 DIAGNOSIS — Z1231 Encounter for screening mammogram for malignant neoplasm of breast: Secondary | ICD-10-CM | POA: Diagnosis not present

## 2023-07-05 DIAGNOSIS — I69952 Hemiplegia and hemiparesis following unspecified cerebrovascular disease affecting left dominant side: Secondary | ICD-10-CM | POA: Diagnosis not present

## 2023-07-05 DIAGNOSIS — E119 Type 2 diabetes mellitus without complications: Secondary | ICD-10-CM | POA: Diagnosis not present

## 2023-07-05 DIAGNOSIS — Z794 Long term (current) use of insulin: Secondary | ICD-10-CM | POA: Diagnosis not present

## 2023-07-05 DIAGNOSIS — J449 Chronic obstructive pulmonary disease, unspecified: Secondary | ICD-10-CM | POA: Diagnosis not present

## 2023-07-11 ENCOUNTER — Telehealth: Payer: Self-pay | Admitting: *Deleted

## 2023-07-11 ENCOUNTER — Encounter: Payer: Self-pay | Admitting: *Deleted

## 2023-07-11 NOTE — Patient Outreach (Signed)
Care Coordination   Follow Up Visit Note   07/11/2023 Name: Amber Harris MRN: [id number] DOB: Mar 10, 1961  Amber Harris is a 62 y.o. year old female who sees Hasanaj, Myra Gianotti, MD for primary care. I spoke with  Amber Harris by phone today.  What matters to the patients health and wellness today?  Managing severe tooth pain    Goals Addressed             This Visit's Progress    Manage Tooth Pain       Care Coordination Goals: Patient will keep appointment with dentist on 07/14/23 for root canal Patient will take medication as prescribed Clindamycin and ibuprofen 800mg  Take both with food and at least 8 oz of water Extra Strength tylenol for now until she receives the ibuprofen by delivery from pharmacy Hold plavix as instructed for 5 days prior to dental procedure Try warm wet compresses to face for 20 minutes at a time throughout the day to see if it will ease the pain Seek emergency medical attention if needed for worsening symptoms Call RN Care Coordinator at [phone] with any resource or care coordination needs         SDOH assessments and interventions completed:  Yes  SDOH Interventions Today    Flowsheet Row Most Recent Value  SDOH Interventions   Transportation Interventions Patient Resources (Friends/Family), Payor Benefit  Financial Strain Interventions Intervention Not Indicated        Care Coordination Interventions:  Yes, provided  Interventions Today    Flowsheet Row Most Recent Value  Chronic Disease   Chronic disease during today's visit Diabetes, Congestive Heart Failure (CHF), Other  [abscessed tooth]  General Interventions   General Interventions Discussed/Reviewed General Interventions Discussed, General Interventions Reviewed, Communication with, Doctor Visits  Doctor Visits Discussed/Reviewed Doctor Visits Discussed, Doctor Visits Reviewed, Specialist  [Dentist visit today. Left upper molar abscess. Hurts into left eye. Appt  scheduled for 07/14/23 for root canal. Severe pain. Patient was crying while on the phone.]  PCP/Specialist Visits Compliance with follow-up visit  Communication with Pharmacists  [[facility]: which medications were prescribed by dentist today and verify that they will be delivered. Clindamycin and ibuprofen 800mg .]  Education Interventions   Education Provided Provided Education  Provided Verbal Education On Medication, Nutrition, When to see the doctor  [Warm wet compress to affected area 20 minutes at a time throughout the day, seek emergency medical attention if needed for worsening symptoms. Take medications with food and at least 8oz of water.]  Nutrition Interventions   Nutrition Discussed/Reviewed Nutrition Discussed, Nutrition Reviewed, Fluid intake  Pharmacy Interventions   Pharmacy Dicussed/Reviewed Pharmacy Topics Discussed, Pharmacy Topics Reviewed, Medications and their functions  [clindamycin can cause abd pain & stomach upset.These are common side effects but reach out to Dr/dentist if severe. Ibuprofen 800mg . Hold Plavix 5 days before procedure as instructed. Alternate tylenol in between ibuprofen doses if needed.]  Safety Interventions   Safety Discussed/Reviewed Safety Discussed, Safety Reviewed  [bleeding risk with plavix, aspirin, and ibuprofen. Patient has been off of Plavix for 3 days now. Aspirin at least 30 min before ibupforen or 8 hrs after to decrease risk of increased side effects.]      Follow up plan: Follow up call scheduled for 07/12/23    Encounter Outcome:  Pt. Visit Completed   Demetrios Loll, BSN, RN-BC RN Care Coordinator [facility]  [facility] Direct Dial: [phone] Main #: [phone]

## 2023-07-12 ENCOUNTER — Encounter: Payer: Self-pay | Admitting: *Deleted

## 2023-07-12 ENCOUNTER — Ambulatory Visit: Payer: Self-pay | Admitting: *Deleted

## 2023-07-12 NOTE — Patient Outreach (Signed)
Care Coordination   Follow Up Visit Note   07/12/2023 Name: Amber Harris MRN: [id number] DOB: [birth year]  Amber Harris is a 62 y.o. year old female who sees Hasanaj, Myra Gianotti, MD for primary care. I spoke with  Amber Harris by phone today.  What matters to the patients health and wellness today?  Managing tooth pain    Goals Addressed             This Visit's Progress    Manage Tooth Pain   On track    Care Coordination Goals: Patient will keep appointment with dentist on 07/14/23 for root canal Patient will take medication as prescribed Clindamycin and ibuprofen 800mg  Take both with food and at least 8 oz of water Hold plavix as instructed for 5 days prior to dental procedure Try warm wet compresses to face for 20 minutes at a time throughout the day to see if it will ease the pain Seek emergency medical attention if needed for new or worsening symptoms Call RN Care Coordinator at [phone] with any resource or care coordination needs         SDOH assessments and interventions completed:  No    Care Coordination Interventions:  Yes, provided  Interventions Today    Flowsheet Row Most Recent Value  Chronic Disease   Chronic disease during today's visit Congestive Heart Failure (CHF), Diabetes  General Interventions   General Interventions Discussed/Reviewed General Interventions Discussed, General Interventions Reviewed  [Quick F/U with patient Re: tooth pain. She was able to sleep last night after taking Tylenol, ibuprofen, and clindamycin. Pain is somewhat improved with medications.]  Doctor Visits Discussed/Reviewed Doctor Visits Discussed, Doctor Visits Reviewed, Specialist  [keep Appt with dentist on 8/15 and seek medical attention for new or worsening symptoms]  PCP/Specialist Visits Compliance with follow-up visit  Education Interventions   Provided Verbal Education On Nutrition, Medication, When to see the doctor  [Warm wet compress to affected area  20 minutes at a time throughout the day, seek emergency medical attention if needed for worsening symptoms. Take medications with food and at least 8oz of water.]  Nutrition Interventions   Nutrition Discussed/Reviewed Nutrition Discussed, Nutrition Reviewed, Fluid intake  Pharmacy Interventions   Pharmacy Dicussed/Reviewed Pharmacy Topics Discussed, Pharmacy Topics Reviewed, Medications and their functions  [Taking medications as prescribed. Holding Plavix. Encouraged to reach out to dentist with any side effects. Eat prior to taking meds and drink at least 8 oz of water]       Follow up plan: Follow up call scheduled for 07/21/23    Encounter Outcome:  Pt. Visit Completed   Demetrios Loll, BSN, RN-BC RN Care Coordinator [facility]  [facility] Direct Dial: [phone] Main #: [phone]

## 2023-07-13 DIAGNOSIS — I69952 Hemiplegia and hemiparesis following unspecified cerebrovascular disease affecting left dominant side: Secondary | ICD-10-CM | POA: Diagnosis not present

## 2023-07-13 DIAGNOSIS — J449 Chronic obstructive pulmonary disease, unspecified: Secondary | ICD-10-CM | POA: Diagnosis not present

## 2023-07-13 DIAGNOSIS — E119 Type 2 diabetes mellitus without complications: Secondary | ICD-10-CM | POA: Diagnosis not present

## 2023-07-13 DIAGNOSIS — Z794 Long term (current) use of insulin: Secondary | ICD-10-CM | POA: Diagnosis not present

## 2023-07-20 ENCOUNTER — Encounter: Payer: Self-pay | Admitting: Cardiology

## 2023-07-20 ENCOUNTER — Ambulatory Visit: Payer: 59 | Attending: Cardiology | Admitting: Cardiology

## 2023-07-20 VITALS — BP 144/74 | HR 65 | Ht 68.0 in | Wt 272.0 lb

## 2023-07-20 DIAGNOSIS — Z79899 Other long term (current) drug therapy: Secondary | ICD-10-CM | POA: Diagnosis not present

## 2023-07-20 DIAGNOSIS — E782 Mixed hyperlipidemia: Secondary | ICD-10-CM

## 2023-07-20 DIAGNOSIS — G4733 Obstructive sleep apnea (adult) (pediatric): Secondary | ICD-10-CM

## 2023-07-20 DIAGNOSIS — I10 Essential (primary) hypertension: Secondary | ICD-10-CM

## 2023-07-20 DIAGNOSIS — I1A0 Resistant hypertension: Secondary | ICD-10-CM

## 2023-07-20 MED ORDER — HYDRALAZINE HCL 50 MG PO TABS: 50.0000 mg | ORAL_TABLET | Freq: Two times a day (BID) | ORAL | 6 refills | Status: DC

## 2023-07-20 NOTE — Patient Instructions (Addendum)
Medication Instructions:   Increase Hydralazine to 50mg  twice a day   Continue all other medications.     Labwork:  BMET, Mg, Renin/Aldosterone ratio Office will contact with results via phone, letter or mychart.     Testing/Procedures:  none  Follow-Up:  6 months   Any Other Special Instructions Will Be Listed Below (If Applicable).  You have been referred to Pulmonology   If you need a refill on your cardiac medications before your next appointment, please call your pharmacy.

## 2023-07-20 NOTE — Progress Notes (Signed)
`~     Clinical Summary Ms. Heckstall is a 62 y.o.female seen today for follow up of the following medical problems.      1. HTN    01/2023 renal artery [place]: no significant stenosis 2013 sleep study: no OSA. - needs aldo/renin level - loud snoring, no apneic episodes, +daytime somnolence.   - several bp meds changed since our last visit, unclear history - compliant with meds - home sbp's 170s   2. Hyperlipidemia   - she is on crestor 40mg  daily.  - 10/2022 TC 166 TG 86 HDL 64 LDL 86 05/2023 TC 89 TG 45 LDL 22 HDL 58   4. Chronic diastolic HF  -occasional LE edema, overall controlled       5. History of CVA - admitted [facility] about 1 year ago - in hospital for about 1 week per her report - residual left sided weakness   04/2020 CT head [facility] old lacunar infarct   - suspecte TIA during 01/2021 admission  - admit 05/2023 with CVA [facility]. Imaging was benign  - plavix was added at that time.      6. Chest pain -01/2021 admission with chest pain - 01/2021 cath without significant disease - still with chest pains at times - sharp pain at times midchest, worst with deep breathing. Lasted about 4.5 hours. Can awake from sleep.  - no recent symptoms    6. Carotid stenosis - carotid US 2023 RICA 1-39%, LICA no stenosis  7. Aortic regurgitation - 05/2023 echo [facility] LVEF >55%, mild to mod AI Past Medical History:  Diagnosis Date   Aortic valve disease    Long-standing systolic murmur   Asthma    Uses p.r.n. albuterol   Bronchitis    COPD (chronic obstructive pulmonary disease) (HCC)    Degenerative joint disease    right knee   Depression    Depression with anxiety    Diabetes mellitus    Dyspnea    exertion   Dyspnea on exertion    poor exercise tolerance   Fibroids    uterine; postmenopausal bleeding   GERD (gastroesophageal reflux disease)    Headache(784.0)    Hyperlipidemia    Hypertension    Myocardial infarction (HCC)    Obesity    Palpitations     Stroke (HCC)    TIA - left side residual   Syncope    exertional   Tobacco user    30 pack years; 04/2011: 1/4 pack per day during quick attempt     Allergies  Allergen Reactions   Aspirin Itching    States that she takes aspirin regularly without significant reaction.   Penicillins Rash     Current Outpatient Medications  Medication Sig Dispense Refill   albuterol (PROVENTIL,VENTOLIN) 90 MCG/ACT inhaler Inhale 2 puffs into the lungs every 6 (six) hours as needed for wheezing or shortness of breath.      aspirin EC 81 MG tablet Take 81 mg by mouth daily.     chlorthalidone (HYGROTON) 50 MG tablet Take 50 mg by mouth daily.     citalopram (CELEXA) 20 MG tablet Take 20 mg by mouth every morning.     clopidogrel (PLAVIX) 75 MG tablet Take 75 mg by mouth every morning.     ezetimibe (ZETIA) 10 MG tablet Take 1 tablet (10 mg total) by mouth daily. 90 tablet 3   Fluticasone-Umeclidin-Vilant (TRELEGY ELLIPTA) 100-62.5-25 MCG/INH AEPB Inhale 1 puff into the lungs daily.  gabapentin (NEURONTIN) 300 MG capsule Take 300 mg by mouth 3 (three) times daily.     ibuprofen (ADVIL) 800 MG tablet Take 800 mg by mouth every 8 (eight) hours as needed.     insulin aspart (NOVOLOG) 100 UNIT/ML injection Inject 8 Units into the skin 3 (three) times daily after meals.     insulin degludec (TRESIBA FLEXTOUCH) 100 UNIT/ML FlexTouch Pen Inject 30 Units into the skin at bedtime.     JARDIANCE 25 MG TABS tablet Take 25 mg by mouth daily.      losartan (COZAAR) 100 MG tablet Take 100 mg by mouth daily.     methocarbamol (ROBAXIN) 500 MG tablet Take 500 mg by mouth at bedtime.     Omega-3 Fatty Acids (FISH OIL) 1000 MG CAPS Take 1 capsule by mouth 3 (three) times daily.     omeprazole (PRILOSEC) 20 MG capsule Take 20 mg by mouth daily.     ondansetron (ZOFRAN) 4 MG tablet Take 4 mg by mouth every 8 (eight) hours as needed.     oxybutynin (DITROPAN-XL) 5 MG 24 hr tablet Take 5 mg by mouth daily.      risperiDONE (RISPERDAL) 0.5 MG tablet Take 0.5 mg by mouth at bedtime.     rosuvastatin (CRESTOR) 40 MG tablet Take 1 tablet (40 mg total) by mouth daily. TAKE ONE TABLET BY MOUTH AT bedtime Needs appointment for further refills 30 tablet 6   topiramate (TOPAMAX) 50 MG tablet Take 50 mg by mouth 2 (two) times daily as needed (headaches).     furosemide (LASIX) 20 MG tablet Take 1 tablet (20 mg total) by mouth daily as needed (swelling). (Patient not taking: Reported on 07/20/2023) 90 tablet 1   hydrALAZINE (APRESOLINE) 50 MG tablet Take 1 tablet (50 mg total) by mouth 2 (two) times daily. 60 tablet 6   No current facility-administered medications for this visit.     Past Surgical History:  Procedure Laterality Date   ABDOMINAL HYSTERECTOMY  12/21/2011   Procedure: HYSTERECTOMY ABDOMINAL;  Surgeon: Tilda Burrow, MD;  Location: [facility];  Service: Gynecology;  Laterality: N/A;   CATARACT EXTRACTION W/PHACO Left 10/03/2020   Procedure: CATARACT EXTRACTION PHACO AND INTRAOCULAR LENS PLACEMENT LEFT EYE;  Surgeon: Fabio Pierce, MD;  Location: [facility];  Service: Ophthalmology;  Laterality: Left;  CDE 5.04   CATARACT EXTRACTION W/PHACO Right 10/17/2020   Procedure: CATARACT EXTRACTION PHACO AND INTRAOCULAR LENS PLACEMENT RIGHT EYE;  Surgeon: Fabio Pierce, MD;  Location: [facility];  Service: Ophthalmology;  Laterality: Right;  CDE: 4.60   LEFT HEART CATH AND CORONARY ANGIOGRAPHY N/A 02/17/2021   Procedure: LEFT HEART CATH AND CORONARY ANGIOGRAPHY;  Surgeon: Tonny Bollman, MD;  Location: [facility] INVASIVE CV LAB;  Service: Cardiovascular;  Laterality: N/A;   TUBAL LIGATION       Allergies  Allergen Reactions   Aspirin Itching    States that she takes aspirin regularly without significant reaction.   Penicillins Rash      Family History  Problem Relation Age of Onset   Lung cancer Mother    Heart disease Brother    Breast cancer Sister    Heart disease Maternal Aunt    Cancer Cousin        lung    Anesthesia problems Neg Hx    Hypotension Neg Hx    Malignant hyperthermia Neg Hx    Pseudochol deficiency Neg Hx      Social History Ms. Wileman reports that she quit smoking about  3 months ago. Her smoking use included cigarettes. She started smoking about 48 years ago. She has a 24.3 pack-year smoking history. She has never used smokeless tobacco. Ms. Mamon reports no history of alcohol use.   Review of Systems CONSTITUTIONAL: No weight loss, fever, chills, weakness or fatigue.  HEENT: Eyes: No visual loss, blurred vision, double vision or yellow sclerae.No hearing loss, sneezing, congestion, runny nose or sore throat.  SKIN: No rash or itching.  CARDIOVASCULAR: per hpi RESPIRATORY: No shortness of breath, cough or sputum.  GASTROINTESTINAL: No anorexia, nausea, vomiting or diarrhea. No abdominal pain or blood.  GENITOURINARY: No burning on urination, no polyuria NEUROLOGICAL: No headache, dizziness, syncope, paralysis, ataxia, numbness or tingling in the extremities. No change in bowel or bladder control.  MUSCULOSKELETAL: No muscle, back pain, joint pain or stiffness.  LYMPHATICS: No enlarged nodes. No history of splenectomy.  PSYCHIATRIC: No history of depression or anxiety.  ENDOCRINOLOGIC: No reports of sweating, cold or heat intolerance. No polyuria or polydipsia.  Marland Kitchen   Physical Examination Today's Vitals   07/20/23 1103 07/20/23 1110 07/20/23 1341  BP: (!) 158/82 (!) 150/79 (!) 144/74  Pulse: 65    SpO2: 98%    Weight: 272 lb (123.4 kg)    Height: 5\' 8"  (1.727 m)     Body mass index is 41.36 kg/m.  Gen: resting comfortably, no acute distress HEENT: no scleral icterus, pupils equal round and reactive, no palptable cervical adenopathy,  CV: RRR, no m/rg, no jvd Resp: Clear to auscultation bilaterally GI: abdomen is soft, non-tender, non-distended, normal bowel sounds, no hepatosplenomegaly MSK: extremities are warm, no edema.  Skin: warm, no rash Neuro:  no  focal deficits Psych: appropriate affect   Diagnostic Studies  10/2012 Event monitor: no arrhythmias     08/2010 Echo:  Left ventricle: The cavity size was normal. Wall thickness was   normal. Systolic function was normal. The estimated ejection   fraction was in the range of 60% to 65%. Wall motion was normal;   there were no regional wall motion abnormalities. The study is not   technically sufficient to allow evaluation of LV diastolic   function.   - Aortic valve: Mildly calcified annulus. Mild regurgitation.   - Mitral valve: Mild regurgitation.   - Tricuspid valve: Mild regurgitation.   - Pericardium, extracardiac: There was no pericardial effusion.     11/14/13 Clinic EKG   Sinus rhythm     10/2013 Echo   LVEF 60-65%, grade II diastolic dysfunction, mild AI, mild MR     01/23/14 Clinic EKG   NSR, LAE, no ischemic changes   01/2021 echo IMPRESSIONS     1. Left ventricular ejection fraction, by estimation, is 60 to 65%. The  left ventricle has normal function. The left ventricle has no regional  wall motion abnormalities. There is moderate concentric left ventricular  hypertrophy. Left ventricular  diastolic parameters are indeterminate. Elevated left ventricular  end-diastolic pressure.   2. Right ventricular systolic function is normal. The right ventricular  size is normal. Tricuspid regurgitation signal is inadequate for assessing  PA pressure.   3. The mitral valve is normal in structure. Trivial mitral valve  regurgitation. No evidence of mitral stenosis.   4. The aortic valve is calcified. Aortic valve regurgitation is mild.  Mild to moderate aortic valve sclerosis/calcification is present, without  any evidence of aortic stenosis. Focal dense calcification of the left  coornary cusp is present. Aortic  regurgitation PHT measures 543 msec.  5. The inferior vena cava is dilated in size with <50% respiratory  variability, suggesting right atrial pressure of  15 mmHg.   01/2021 cath 1. Widely patent coronary arteries with no obstructive CAD 2. Normal LV function   05/2023 echo [facility] Summary   1. The left ventricle is normal in size with normal wall thickness.    2. The left ventricular systolic function is normal, LVEF is visually  estimated at > 55%.    3. There is mild to moderate aortic regurgitation.    4. The right ventricle is normal in size, with normal systolic function.    5. There is no evidence of an interatrial flow communication or  intrapulmonary shunt by agitated saline study.    Assessment and Plan   Resistant HTN - several bp med changes since our last visit, unclear history - negative renal artery [place]. Check aldo/renin. Several OSA signs and symptoms, refer to pulmonary - increase hydralazine to 50mg  bid.    2. Hyperlipidemia   - LDL at goal, continue current meds   3. Chronic diastolic heart failure   -euvolemic without symptoms, continue current meds - check bmet/mg       Antoine Poche, M.D.

## 2023-07-21 ENCOUNTER — Ambulatory Visit: Payer: Self-pay | Admitting: *Deleted

## 2023-07-21 ENCOUNTER — Telehealth: Payer: Self-pay | Admitting: *Deleted

## 2023-07-21 ENCOUNTER — Encounter: Payer: Self-pay | Admitting: *Deleted

## 2023-07-21 DIAGNOSIS — Z5941 Food insecurity: Secondary | ICD-10-CM

## 2023-07-21 NOTE — Patient Outreach (Signed)
Care Coordination   07/21/2023 Name: Amber Harris MRN: [id number] DOB: 07-10-1961   Care Coordination Outreach Attempts:  An unsuccessful telephone outreach was attempted for a scheduled appointment today.  Follow Up Plan:  Additional outreach attempts will be made to offer the patient care coordination information and services.   Encounter Outcome:  No Answer   Care Coordination Interventions:  No, not indicated    Demetrios Loll, BSN, RN-BC RN Care Coordinator [facility]  [facility] Direct Dial: [phone] Main #: [phone]

## 2023-07-22 ENCOUNTER — Encounter: Payer: Self-pay | Admitting: Cardiology

## 2023-07-22 ENCOUNTER — Ambulatory Visit: Payer: Self-pay | Admitting: *Deleted

## 2023-07-22 NOTE — Patient Outreach (Addendum)
Care Coordination   Follow Up Visit Note   07/21/2023 Name: Amber Harris MRN: [id number] DOB: [birth year]  Amber Harris is a 62 y.o. year old female who sees Hasanaj, Myra Gianotti, MD for primary care. I spoke with  Amber Harris by phone today. Today's call was to provide a short interval follow-up on dental pain. I noted that she had a cardiology visit yesterday and so we reviewed and discussed that as well along with her medication changes. She also mentioned that she used her food stamp card to buy groceries through [place] delivery service and they were delivered to the wrong apartment. She has contacted [employer] who is not helping because they have proof of delivery, but the photo is of a different apartment door. It is difficult to prove that it wasn't her door. She has also spoke with social services and they wouldn't add more money to her food stamp card and per patient they stated, "how do we know you're not just trying to get more food." She has 3 frozen meals in her freezer.   What matters to the patients health and wellness today?  Assistance with food insecurity    Goals Addressed             This Visit's Progress    Care Coordination Services   On track    Care Coordination Goals: Patient will talk with Care Management Community Care Guides regarding need for food assistance Patient will reach out to RN Care Coordinator at [phone] with any resource or care coordination needs      Manage Blood Pressure       Care Coordination Goals: Patient will increase hydralazine to 50 mg BID as directed Currently has 25 mg tablets. Plans to take 2 tablets BID until she runs out and then will fill the new prescription.  Patient will monitor and record blood pressure daily and as needed and will call PCP or specialist with any readings outside of recommended range Patient will take blood pressure log to PCP and specialty appointments for review Patient will follow a low  sodium/DASH diet  Patient will reach out to RN Care Coordinator [phone] with any care coordination or resource needs       Manage Tooth Pain   On track    Care Coordination Goals: Patient will reach out to dentist with any new symptoms Patient will follow-up with oral surgeon as recommended for retained root after tooth broke off during attempted extraction Patient will take medication as prescribed Clindamycin and ibuprofen 800mg  Talk with provider or pharmacist about use of ibuprofen with plavix Call RN Care Coordinator at [phone] with any resource or care coordination needs         SDOH assessments and interventions completed:  Yes  SDOH Interventions Today    Flowsheet Row Most Recent Value  SDOH Interventions   Food Insecurity Interventions AMB Referral  Transportation Interventions Patient Resources (Friends/Family), Payor Benefit        Care Coordination Interventions:  Yes, provided     Interventions Today    Flowsheet Row Most Recent Value  Chronic Disease   Chronic disease during today's visit --  [Aortic Valve Disease, Dental problems, food insecurity]  General Interventions   General Interventions Discussed/Reviewed General Interventions Discussed, General Interventions Reviewed, Durable Medical Equipment (DME), Doctor Visits  [Tooth pain has resolved. Dentist was not able to extract the whole tooth and it broke off at the root. She has been referred to an oral  surgeon.]  Doctor Visits Discussed/Reviewed Doctor Visits Discussed, Doctor Visits Reviewed, PCP, Specialist  [reviewed and discussed recent cardiology visit and dentist visit]  Durable Medical Equipment (DME) BP Cuff, Other  [CPAP-encouraged to reach out to [employer] to discuss issues with mask moving out of place during the night. BP at office visit on 07/20/23 was 144/74. No home reading available, but reports it is staying below 150/90.]  PCP/Specialist Visits Compliance with follow-up visit   [Sleep consult with [facility] on 08/25/23, Cardiology F/U on 01/26/24]  Communication with --  [communication with care guide regarding referral for food insecurity. Requested that be routed to Child psychotherapist today]  Education Interventions   Education Provided Provided Education, Provided [employer]  [printed information on pharmacy options since [facility] is closing. patient will need to chose a new pharmacy and providers will need to send new prescriptions to that pharmacy.]  Provided Verbal Education On When to see the doctor, Medication, Community Resources, Nutrition  [blood pressure monitoring, food resources]  Nutrition Interventions   Nutrition Discussed/Reviewed Nutrition Discussed, Nutrition Reviewed, Fluid intake, Decreasing salt  Pharmacy Interventions   Pharmacy Dicussed/Reviewed Pharmacy Topics Discussed, Pharmacy Topics Reviewed, Medications and their functions  [prescribed ibuprofen 800mg  to use for tooth pain. No current pain.Cautioned against use of NSAIDs with plavix. Hydralzaine increased from 25 mg BID to 50 mg BID.]  Safety Interventions   Safety Discussed/Reviewed Safety Discussed, Safety Reviewed  [Increased risk of bleeding with use of ibuprofen with plavix. Concerns explained. Pt stated understanding. Encouraged to talk with provider and/or pharmacist before use.]       Follow up plan: Follow up call scheduled for 07/27/23    Encounter Outcome:  Pt. Visit Completed   Demetrios Loll, BSN, RN-BC RN Care Coordinator [facility]  [facility] Direct Dial: [phone] Main #: [phone]

## 2023-07-23 NOTE — Patient Outreach (Addendum)
Care Coordination   Initial Visit Note   07/23/2023 Late Entry Name: Amber Harris MRN: [id number] DOB: [birth year]  Amber Harris is a 62 y.o. year old female who sees Hasanaj, Myra Gianotti, MD for primary care. I spoke with  Amber Harris by phone on 07/22/23.  What matters to the patients health and wellness today?  Resources for food. Patient states that her [employer] Delivery was delivered to the wrong address and now does not have food to last until her food stamp care re-loads on the 3rd of next month    Goals Addressed             This Visit's Progress    community resources for food insecurity       Interventions Today    Flowsheet Row Most Recent Value  Chronic Disease   Chronic disease during today's visit Other  [food insecurity]  General Interventions   General Interventions Discussed/Reviewed [facility]  [patient requesting assistance with food resources-collaboration phone call made to the [employer] confirmed that food boxes are provided M-F 8:30-11:30am and 12, 30-1:30pm need proof of address and ID-Pt to ask son to take her today]  Mental Health Interventions   Mental Health Discussed/Reviewed Mental Health Discussed, Coping Strategies, Depression  [medication's managed by PCP at this time, does not see a therapist at this time, denies need. patient able to verbalize positive coping strategies to manage depression]  Nutrition Interventions   Nutrition Discussed/Reviewed Nutrition Discussed  [Pt. has food stamps, re-loads on the 3rd of the month and a Ucard that re-loads on the 3rd of the month patient will utilize food pantry until food stamp and Ucard re-loads-part of U-card  used to pay utilities. Patient's son to assist with food as well]  Pharmacy Interventions   Pharmacy Dicussed/Reviewed Pharmacy Topics Discussed  [patient confirms use of pill packs that [facility]]              SDOH assessments and  interventions completed:  Yes  SDOH Interventions Today    Flowsheet Row Most Recent Value  SDOH Interventions   Food Insecurity Interventions Other (Comment)  [patient has food stamps $188.00 will re-load on the 3rd of the month, also has a Ucard that will re-load on the 3rd of the month-patient will present to the [employer] food pantry on Monday to obtain a food box]  Housing Interventions Intervention Not Indicated  Transportation Interventions Patient Resources (Friends/Family), Payor Benefit  Utilities Interventions Intervention Not Indicated        Care Coordination Interventions:  Yes, provided   Follow up plan: Follow up call scheduled for 07/28/23    Encounter Outcome:  Pt. Visit Completed

## 2023-07-23 NOTE — Patient Instructions (Signed)
Visit Information  Thank you for taking time to visit with me today. Please don't hesitate to contact me if I can be of assistance to you.   Following are the goals we discussed today:  Pease present to the [employer] for food resources [address] [place] Monday-Friday 8:30am-11:30am and 12:30pm-1:30pm   Our next appointment is by telephone on 07/28/23 at 3pm  Please call the care guide team at [phone] if you need to cancel or reschedule your appointment.   If you are experiencing a Mental Health or Behavioral Health Crisis or need someone to talk to, please call the Suicide and Crisis Lifeline: 988   Patient verbalizes understanding of instructions and care plan provided today and agrees to view in MyChart. Active MyChart status and patient understanding of how to access instructions and care plan via MyChart confirmed with patient.     Telephone follow up appointment with care management team member scheduled for: 07/23/22   Verna Czech, LCSW Clinical Social Worker  [phone]

## 2023-07-27 ENCOUNTER — Encounter: Payer: Self-pay | Admitting: *Deleted

## 2023-07-27 ENCOUNTER — Ambulatory Visit: Payer: Self-pay | Admitting: *Deleted

## 2023-07-27 NOTE — Patient Outreach (Signed)
Care Coordination   Follow Up Visit Note   07/27/2023 Name: Amber Harris MRN: [id number] DOB: 09-Oct-1961  Amber Harris is a 62 y.o. year old female who sees Hasanaj, Myra Gianotti, MD for primary care. I spoke with  Amber Harris by phone today.  What matters to the patients health and wellness today?  Scheduling Personal Care Services    Goals Addressed             This Visit's Progress    COMPLETED: Care Coordination Services       Care Coordination Goals: Patient will talk with Care Management Community Care Guides regarding need for food assistance Patient will choose a new pharmacy from the pharmacy list provided since [facility] is closing.  Providers will need to send in new prescriptions to the chosen pharmacy Patient will reach out to RN Care Coordinator at [phone] with any resource or care coordination needs  Goal Met. Patient has connected with CSW regarding food resources at [employer] and has had some help from a neighbor. Prepackaged medications will be filled and mailed by [facility].      Manage Blood Pressure   On track    Care Coordination Goals: Continue to take Hydralazine 50mg  as directed Patient will monitor and record blood pressure daily and as needed and will call PCP or specialist with any readings outside of recommended range Patient will have blood pressing readings available for review at next RN Care Coordinator telephone appointment Patient will reach out to RN Care Coordinator [phone] with any care coordination or resource needs       Manage Tooth Pain   On track    Care Coordination Goals: Patient will reach out to dentist with any new symptoms No pain or discomfort at this time Patient will follow-up with oral surgeon as recommended for retained root after tooth broke off during attempted extraction Has left messages for several practices and is awaiting a call back Call RN Care Coordinator at [phone] with any  resource or care coordination needs         SDOH assessments and interventions completed:  Yes  SDOH Interventions Today    Flowsheet Row Most Recent Value  SDOH Interventions   Food Insecurity Interventions Other (Comment)  [Was provided with information on [employer], commercial by CSW. Son is able to assist some and her neighbor gave her a pack of chicken and hamburger. Has not missed a meal so far despite UCard and Food Stamp card being empty until it reloads on 08/02/23.]  Transportation Interventions Patient Resources (Friends/Family), Payor Benefit        Care Coordination Interventions:  Yes, provided  Interventions Today    Flowsheet Row Most Recent Value  Chronic Disease   Chronic disease during today's visit Hypertension (HTN)  General Interventions   General Interventions Discussed/Reviewed General Interventions Discussed, General Interventions Reviewed, Doctor Visits, Durable Medical Equipment (DME), [facility], Communication with  [Does not have tooth pain presently. Has left messages for several oral surgeons and is waiting a call back for scheduling.]  Durable Medical Equipment (DME) BP Cuff  [Continue to check and record blood pressure daily & to have readings available for review at next telephone appointment. No readings available today as she was not at home during time of call. Reports that they are "better" since increasing hydralazine.]  PCP/Specialist Visits Compliance with follow-up visit  [Sleep consult with [facility] on 08/25/23, Cardiology F/U on 01/26/24]  Communication with Social  Work  [employer] that patient has not been able to go to the [employer] for food as of today]  Education Interventions   Provided Verbal Education On Medication, When to see the doctor, [facility]  [blood pressure monitoring. Has not been able to  use [employer] for food yet because of son's work schedule, but she has  the information & he is trying to get her there. Reports that she hasn't missed a meal yet.]  Nutrition Interventions   Nutrition Discussed/Reviewed Nutrition Discussed, Nutrition Reviewed  [Focus is on obtaining food due to limited food resources at present]  Pharmacy Interventions   Pharmacy Dicussed/Reviewed Pharmacy Topics Discussed, Pharmacy Topics Reviewed, Medications and their functions  [received new prescription for hydralazine 50mg  and has started taking that. Prepackaged prescriptions will be filled and mailed by [facility] since [facility]. She is not having to take NSAID for tooth pain.]  Safety Interventions   Safety Discussed/Reviewed Safety Discussed, Safety Reviewed, Home Safety, Fall Risk  [had home evalution for Personal Care Services yesterday and is waiting for a call back to discuss scheduling hours of assistance]      Follow up plan: Follow up call scheduled for 08/11/23    Encounter Outcome:  Pt. Visit Completed   Demetrios Loll, BSN, RN-BC RN Care Coordinator [facility]  [facility] Direct Dial: [phone] Main #: [phone]

## 2023-07-28 ENCOUNTER — Ambulatory Visit: Payer: Self-pay | Admitting: *Deleted

## 2023-07-28 NOTE — Patient Instructions (Signed)
Visit Information  Thank you for taking time to visit with me today. Please don't hesitate to contact me if I can be of assistance to you.   Following are the goals we discussed today:  Please present to the [employer] to obtain a food box if needed-hours of operation  Monday-Friday 8:30-11:30am and 12:30-1:30pm [address] [place], [place] [address] [phone]   Our next appointment is by telephone on 08/12/23 at 3pm  Please call the care guide team at [phone] if you need to cancel or reschedule your appointment.   If you are experiencing a Mental Health or Behavioral Health Crisis or need someone to talk to, please call 911   Patient verbalizes understanding of instructions and care plan provided today and agrees to view in MyChart. Active MyChart status and patient understanding of how to access instructions and care plan via MyChart confirmed with patient.     Telephone follow up appointment with care management team member scheduled for: 08/12/23  Verna Czech, LCSW Clinical Social Worker  [phone]

## 2023-07-28 NOTE — Patient Outreach (Addendum)
Care Coordination   Follow Up Visit Note   07/28/2023 Name: Amber Harris MRN: [id number] DOB: Dec 02, 1960  Amber Harris is a 62 y.o. year old female who sees Hasanaj, Myra Gianotti, MD for primary care. I spoke with  Amber Harris by phone today.  What matters to the patients health and wellness today?  Community resources to address food insecurity    Goals Addressed             This Visit's Progress    community resources for food insecurity       Interventions Today    Flowsheet Row Most Recent Value  Chronic Disease   Chronic disease during today's visit Hypertension (HTN)  General Interventions   General Interventions Discussed/Reviewed General Interventions Reviewed, [facility], Level of Care  [patient continues to be assessed for resources to address food insecurity]  Level of Care Personal Care Services  [patient confirms that she has completed the assessment for personal care services on 07/25/23-currenlty awaiting formal approval]  Education Interventions   Provided Verbal Education On [employer] provided with the operation hours of the [employer] bank Mon-Fri 8:30-11:30 and 12, 30-1:30pm]  Nutrition Interventions   Nutrition Discussed/Reviewed Nutrition Discussed  [Pt currently at son's home who is providing meals. "He makes sure that I eat daily" Son agreeable to taking patient to the [employer] tomorrow to obtain a food box. confirmed that food stamps will re-load on 9/3 as well as benefits on her Ucard]              SDOH assessments and interventions completed:  No     Care Coordination Interventions:  Yes, provided   Follow up plan: Follow up call scheduled for 08/12/23    Encounter Outcome:  Pt. Visit Completed

## 2023-07-29 DIAGNOSIS — I10 Essential (primary) hypertension: Secondary | ICD-10-CM | POA: Diagnosis not present

## 2023-07-29 DIAGNOSIS — R4781 Slurred speech: Secondary | ICD-10-CM | POA: Diagnosis not present

## 2023-07-29 DIAGNOSIS — R29898 Other symptoms and signs involving the musculoskeletal system: Secondary | ICD-10-CM | POA: Diagnosis not present

## 2023-07-29 DIAGNOSIS — I6502 Occlusion and stenosis of left vertebral artery: Secondary | ICD-10-CM | POA: Diagnosis not present

## 2023-07-29 DIAGNOSIS — Z720 Tobacco use: Secondary | ICD-10-CM | POA: Diagnosis not present

## 2023-07-29 DIAGNOSIS — E118 Type 2 diabetes mellitus with unspecified complications: Secondary | ICD-10-CM | POA: Diagnosis not present

## 2023-07-29 DIAGNOSIS — R200 Anesthesia of skin: Secondary | ICD-10-CM | POA: Diagnosis not present

## 2023-07-29 DIAGNOSIS — Z7984 Long term (current) use of oral hypoglycemic drugs: Secondary | ICD-10-CM | POA: Diagnosis not present

## 2023-07-29 DIAGNOSIS — Z8673 Personal history of transient ischemic attack (TIA), and cerebral infarction without residual deficits: Secondary | ICD-10-CM | POA: Diagnosis not present

## 2023-07-29 DIAGNOSIS — Z79899 Other long term (current) drug therapy: Secondary | ICD-10-CM | POA: Diagnosis not present

## 2023-07-29 DIAGNOSIS — Z556 Problems related to health literacy: Secondary | ICD-10-CM | POA: Diagnosis not present

## 2023-07-29 DIAGNOSIS — R29707 NIHSS score 7: Secondary | ICD-10-CM | POA: Diagnosis not present

## 2023-07-29 DIAGNOSIS — J449 Chronic obstructive pulmonary disease, unspecified: Secondary | ICD-10-CM | POA: Diagnosis not present

## 2023-07-29 DIAGNOSIS — I6782 Cerebral ischemia: Secondary | ICD-10-CM | POA: Diagnosis not present

## 2023-07-29 DIAGNOSIS — E1165 Type 2 diabetes mellitus with hyperglycemia: Secondary | ICD-10-CM | POA: Diagnosis not present

## 2023-07-29 DIAGNOSIS — Z880 Allergy status to penicillin: Secondary | ICD-10-CM | POA: Diagnosis not present

## 2023-07-29 DIAGNOSIS — I700 Atherosclerosis of aorta: Secondary | ICD-10-CM | POA: Diagnosis not present

## 2023-07-29 DIAGNOSIS — Z7982 Long term (current) use of aspirin: Secondary | ICD-10-CM | POA: Diagnosis not present

## 2023-07-29 DIAGNOSIS — I6523 Occlusion and stenosis of bilateral carotid arteries: Secondary | ICD-10-CM | POA: Diagnosis not present

## 2023-07-29 DIAGNOSIS — I6389 Other cerebral infarction: Secondary | ICD-10-CM | POA: Diagnosis not present

## 2023-07-29 DIAGNOSIS — G459 Transient cerebral ischemic attack, unspecified: Secondary | ICD-10-CM | POA: Diagnosis not present

## 2023-07-29 DIAGNOSIS — R519 Headache, unspecified: Secondary | ICD-10-CM | POA: Diagnosis not present

## 2023-07-29 DIAGNOSIS — E119 Type 2 diabetes mellitus without complications: Secondary | ICD-10-CM | POA: Diagnosis not present

## 2023-07-29 DIAGNOSIS — F17210 Nicotine dependence, cigarettes, uncomplicated: Secondary | ICD-10-CM | POA: Diagnosis not present

## 2023-07-29 DIAGNOSIS — I639 Cerebral infarction, unspecified: Secondary | ICD-10-CM | POA: Diagnosis not present

## 2023-07-29 DIAGNOSIS — R531 Weakness: Secondary | ICD-10-CM | POA: Diagnosis not present

## 2023-07-29 DIAGNOSIS — E785 Hyperlipidemia, unspecified: Secondary | ICD-10-CM | POA: Diagnosis not present

## 2023-07-29 DIAGNOSIS — Z794 Long term (current) use of insulin: Secondary | ICD-10-CM | POA: Diagnosis not present

## 2023-07-29 DIAGNOSIS — G8194 Hemiplegia, unspecified affecting left nondominant side: Secondary | ICD-10-CM | POA: Diagnosis not present

## 2023-07-29 DIAGNOSIS — R930 Abnormal findings on diagnostic imaging of skull and head, not elsewhere classified: Secondary | ICD-10-CM | POA: Diagnosis not present

## 2023-07-29 DIAGNOSIS — Z7902 Long term (current) use of antithrombotics/antiplatelets: Secondary | ICD-10-CM | POA: Diagnosis not present

## 2023-07-29 DIAGNOSIS — N179 Acute kidney failure, unspecified: Secondary | ICD-10-CM | POA: Diagnosis not present

## 2023-08-11 ENCOUNTER — Ambulatory Visit: Payer: Self-pay | Admitting: *Deleted

## 2023-08-11 ENCOUNTER — Other Ambulatory Visit: Payer: Self-pay | Admitting: Cardiology

## 2023-08-11 ENCOUNTER — Encounter: Payer: Self-pay | Admitting: *Deleted

## 2023-08-11 DIAGNOSIS — Z5941 Food insecurity: Secondary | ICD-10-CM

## 2023-08-12 ENCOUNTER — Ambulatory Visit: Payer: Self-pay | Admitting: *Deleted

## 2023-08-12 ENCOUNTER — Encounter: Payer: Self-pay | Admitting: *Deleted

## 2023-08-12 NOTE — Patient Outreach (Signed)
Care Coordination   Follow Up Visit Note   08/12/2023 Name: Amber Harris MRN: [id number] DOB: 1961/05/16  Amber Harris is a 62 y.o. year old female who sees Hasanaj, Myra Gianotti, MD for primary care. I spoke with  Amber Harris by phone today.  What matters to the patients health and wellness today?  Arranging home health physical therapy and personal care services.     Goals Addressed             This Visit's Progress    Care Coordination Services   Not on track    Care Coordination Goals: Patient will start HHPT (orders placed at discharge from [facility]) Most recently, orders were sent to [facility]  Has not responded to [facility] Case Manager yet Patient will work with [facility] Pall[facility] Care services (orders placed at discharge from [facility]) [phone] Patient nor RN has received a call Patient will work with [facility] for Personal Care Services [phone] Approved for 63 hours per month  Patient nor RN has received a call Patient will continue to use rolling walker and cane for ambulation Patient will keep appointment with Dr Olena Leatherwood for a hospital follow-up on 08/15/23 Will need a referral to neurology Patient will seek medical attention for any new or worsening symptoms Patient can reach out to RN Care Management Coordinator at [phone] with any resource or care coordination needs        SDOH assessments and interventions completed:  No     Care Coordination Interventions:  Yes, provided  Interventions Today    Flowsheet Row Most Recent Value  Chronic Disease   Chronic disease during today's visit Hypertension (HTN), Other  [CVA]  General Interventions   General Interventions Discussed/Reviewed General Interventions Discussed, General Interventions Reviewed, Durable Medical Equipment (DME), Doctor Visits, Communication with  PCP/Specialist Visits Compliance with follow-up visit, Contact provider for referral to  [Dr  Hasanaj on 08/15/23 for hospital follow-up.]  Contacted provider for referral to Specialist  [facility] will likely need to place neurology referral]  Communication with PCP/Specialists  [PCP via [facility] secure fax Re: hospital discharge on 08/06/23 and need for neurology referral]  Level of Care Personal Care Services  [Discussed that phone number for personal care service angency that patient provided is for [facility] in [place]. Message has been left inquiring on status.]  Education Interventions   Education Provided Provided Education  Provided Verbal Education On When to see the doctor, Other  [Need for PCP to place neurology referral. Seek medical attention for new or worsening symptoms.]  Safety Interventions   Home Safety Contact home health agency  [waiting on [facility] to respond to [facility] Case Manager Re: HHPT referral]  Advanced Directive Interventions   Advanced Directives Discussed/Reviewed End of Life  End of Life Palliative  [waiting on [facility] to respond to VM left yesterday Re: status of referral]       Follow up plan: Follow up call scheduled for 08/16/23    Encounter Outcome:  Patient Visit Completed   Demetrios Loll, RN, BSN Care Management Coordinator [facility]  [facility] Direct Dial: [phone] Main #: [phone]

## 2023-08-12 NOTE — Patient Outreach (Signed)
Care Coordination   Follow Up Visit Note   08/11/2023 Name: Amber Harris MRN: [id number] DOB: 04-27-1961  Amber Harris is a 62 y.o. year old female who sees Hasanaj, Myra Gianotti, MD for primary care. I spoke with  Amber Harris by phone today.  What matters to the patients health and wellness today?  Coordination home care and appointments s/p hospital discharge on 08/03/23. Primary diagnosis was acute CVA. Hospital notes and imaging studies were reviewed and discussed. Conference call with patient made to [facility] Re: HHPT and [facility] Re: Palliative Care to follow-up on referrals. RN call to [facility] Case Manager to follow-up on HHPT and neurology referrals.    Goals Addressed             This Visit's Progress    Care Coordination Services       Care Coordination Goals: Patient will start HHPT (orders placed at discharge from [facility]) Most recently, orders were sent to [facility]. Case Manager for [facility] is trying to work with them to arrange services. Insurance has been a barrier. Denied by [employer] who she had services with recently.  Patient will work with [facility] Palliative Care services (orders placed at discharge from [facility]) [phone] Patient will work with [facility] for Personal Care Services [phone] Approved for 63 hours per month  Patient will continue to use rolling walker and cane for ambulation Patient will keep appointment with Dr Olena Leatherwood for a hospital follow-up on 08/15/23 Will need a referral to neurology if one wasn't placed by [facility] Patient will seek medical attention for any new or worsening symptoms Patient can reach out to RN Care Management Coordinator at [phone] with any resource or care coordination needs        SDOH assessments and interventions completed:  Yes  SDOH Interventions Today    Flowsheet Row Most Recent Value  SDOH Interventions   Transportation Interventions Patient Resources  (Friends/Family), Payor Benefit  Physical Activity Interventions Other (Comments)  [Limited by left leg weakness. HHPT has been ordered.]        Care Coordination Interventions:  Yes, provided  Interventions Today    Flowsheet Row Most Recent Value  Chronic Disease   Chronic disease during today's visit Hypertension (HTN), Other  [CVA]  General Interventions   General Interventions Discussed/Reviewed General Interventions Discussed, General Interventions Reviewed, Doctor Visits, Durable Medical Equipment (DME), Communication with  Doctor Visits Discussed/Reviewed Doctor Visits Discussed, Doctor Visits Reviewed, PCP, Specialist  [Reviewed and discussed discharge from [facility] on 08/03/23.]  Durable Medical Equipment (DME) BP Cuff, Ephraim Hamburger. Blood pressure readings unavailable. Encouraged to monitor and record daily and to take log to provider visits for review.]  PCP/Specialist Visits Compliance with follow-up visit  [facility] hospital follow-up with Dr Olena Leatherwood on 08/15/23. Schedule appointment with neurologist in [place]. Discharge documentation states referral was sent to neurologist in [place]. Unsure of which office. Appt has not been scheduled.]  Communication with Social Work  [VM left for [employer] with [employer] [phone] Re: HHPT and neurology referral. VM received that she is having difficulty finding home health that takes insurance.]  Level of Care Personal Care Services  [per patient, approved for 63 hrs per month. She chose [facility] located in [place]. [phone]. Visit has not been scheduled yet.]  Exercise Interventions   Exercise Discussed/Reviewed Physical Activity  Physical Activity Discussed/Reviewed Physical Activity Discussed, Physical Activity Reviewed  [ambulating with walker or cane. Able to perform  most ADLs with rest breaks.Will have Personal Care Services starting soon. HHPT also ordered but has not started.]   Education Interventions   Education Provided Provided Education  Provided Verbal Education On Nutrition, Medication, When to see the doctor, [facility], Other  [blood pressure monitoring. Has information on local food resources but they aren't needed at this time. She does request Mom's Meals again and should qualify since she was recently discharged from inpatient hospital stay. AMB Ref2300 placed.]  Nutrition Interventions   Nutrition Discussed/Reviewed Nutrition Discussed, Nutrition Reviewed, Decreasing salt, Fluid intake, Adding fruits and vegetables, Decreasing fats  [Low sodium, heart healthy diet]  Pharmacy Interventions   Pharmacy Dicussed/Reviewed Pharmacy Topics Discussed, Pharmacy Topics Reviewed, Medications and their functions  [medications reviewed and discussed. Taking as prescribed. no questions or concerns at this time.]  Safety Interventions   Safety Discussed/Reviewed Safety Discussed, Safety Reviewed, Fall Risk, Home Safety  Home Safety Assistive Devices, Contact home health agency  [[facility] does not have referral on file from 08/03/23. Case was closed in August 2024. Per Case Manager at [facility], she has sent referral to several agencies. Most recent being [facility] and is waiting for response.]  Advanced Directive Interventions   Advanced Directives Discussed/Reviewed End of Life  End of Life Palliative  [Referral placed for [facility] at hospital discharge. RN left message for [facility] Care Department [phone] and asked them to call back with update of referral]       Follow up plan: Follow up call scheduled for 08/12/23    Encounter Outcome:  Patient Visit Completed   Demetrios Loll, RN, BSN Care Management Coordinator [facility]  [facility] Direct Dial: [phone] Main #: [phone]

## 2023-08-13 NOTE — Patient Instructions (Addendum)
Visit Information  Thank you for taking time to visit with me today. Please don't hesitate to contact me if I can be of assistance to you.   Following are the goals we discussed today:  Please continue to utilize the [employer]-food back to address food insecurity concerns Please contact this social worker with any additional community resource needs   Our next appointment is by telephone on 08/24/23 at 2pm  Please call the care guide team at [phone] if you need to cancel or reschedule your appointment.   If you are experiencing a Mental Health or Behavioral Health Crisis or need someone to talk to, please call 911   Patient verbalizes understanding of instructions and care plan provided today and agrees to view in MyChart. Active MyChart status and patient understanding of how to access instructions and care plan via MyChart confirmed with patient.     Telephone follow up appointment with care management team member scheduled for: 08/24/23   Verna Czech, LCSW [place]  [facility], [facility] Licensed Clinical Social Worker Care Coordinator  Direct Dial: [phone]

## 2023-08-13 NOTE — Patient Outreach (Addendum)
Care Coordination   Follow Up Visit Note   08/13/2023 Name: Amber Harris MRN: [id number] DOB: 02/27/1961  Amber Harris is a 62 y.o. year old female who sees Hasanaj, Myra Gianotti, MD for primary care. I spoke with  Amber Harris by phone on 08/13/23.  What matters to the patients health and wellness today?  Community resources to address food insecurity    Goals Addressed             This Visit's Progress    community resources for food insecurity       Interventions Today    Flowsheet Row Most Recent Value  Chronic Disease   Chronic disease during today's visit Hypertension (HTN)  General Interventions   General Interventions Discussed/Reviewed General Interventions Reviewed, [facility], Doctor Visits  Doctor Visits Discussed/Reviewed Doctor Visits Reviewed, Specialist  [08/25/23 Sleep Consult 01/26/24 Cardiology]  Education Interventions   Provided Verbal Education On Nutrition  [patient verbalized having no additional challenges with food insecurlty-was able to present to the [employer], also has the support of her son who also provides assistance with food]              SDOH assessments and interventions completed:  No     Care Coordination Interventions:  Yes, provided   Follow up plan: Follow up call scheduled for 08/24/23    Encounter Outcome:  Patient Visit Completed

## 2023-08-15 ENCOUNTER — Telehealth: Payer: Self-pay

## 2023-08-15 DIAGNOSIS — G458 Other transient cerebral ischemic attacks and related syndromes: Secondary | ICD-10-CM | POA: Diagnosis not present

## 2023-08-15 DIAGNOSIS — I10 Essential (primary) hypertension: Secondary | ICD-10-CM | POA: Diagnosis not present

## 2023-08-15 NOTE — Telephone Encounter (Signed)
Telephone encounter was:  Unsuccessful.  08/15/2023 Name: Amber Harris MRN: [id number] DOB: Dec 08, 1960  Unsuccessful outbound call made today to assist with:  Food Insecurity  Outreach Attempt:  1st Attempt  Unable to leave a message     [facility]  [facility], [facility] Guide, Phone: [phone] Website: [email or web address]

## 2023-08-16 ENCOUNTER — Ambulatory Visit: Payer: Self-pay | Admitting: *Deleted

## 2023-08-16 ENCOUNTER — Encounter: Payer: Self-pay | Admitting: *Deleted

## 2023-08-16 DIAGNOSIS — Z7951 Long term (current) use of inhaled steroids: Secondary | ICD-10-CM | POA: Diagnosis not present

## 2023-08-16 DIAGNOSIS — Z7902 Long term (current) use of antithrombotics/antiplatelets: Secondary | ICD-10-CM | POA: Diagnosis not present

## 2023-08-16 DIAGNOSIS — M1990 Unspecified osteoarthritis, unspecified site: Secondary | ICD-10-CM | POA: Diagnosis not present

## 2023-08-16 DIAGNOSIS — E785 Hyperlipidemia, unspecified: Secondary | ICD-10-CM | POA: Diagnosis not present

## 2023-08-16 DIAGNOSIS — Z79899 Other long term (current) drug therapy: Secondary | ICD-10-CM | POA: Diagnosis not present

## 2023-08-16 DIAGNOSIS — R4781 Slurred speech: Secondary | ICD-10-CM | POA: Diagnosis not present

## 2023-08-16 DIAGNOSIS — I10 Essential (primary) hypertension: Secondary | ICD-10-CM | POA: Diagnosis not present

## 2023-08-16 DIAGNOSIS — E119 Type 2 diabetes mellitus without complications: Secondary | ICD-10-CM | POA: Diagnosis not present

## 2023-08-16 DIAGNOSIS — J4489 Other specified chronic obstructive pulmonary disease: Secondary | ICD-10-CM | POA: Diagnosis not present

## 2023-08-16 DIAGNOSIS — Z7984 Long term (current) use of oral hypoglycemic drugs: Secondary | ICD-10-CM | POA: Diagnosis not present

## 2023-08-16 DIAGNOSIS — Q211 Atrial septal defect, unspecified: Secondary | ICD-10-CM | POA: Diagnosis not present

## 2023-08-16 DIAGNOSIS — Z794 Long term (current) use of insulin: Secondary | ICD-10-CM | POA: Diagnosis not present

## 2023-08-16 DIAGNOSIS — I69354 Hemiplegia and hemiparesis following cerebral infarction affecting left non-dominant side: Secondary | ICD-10-CM | POA: Diagnosis not present

## 2023-08-16 DIAGNOSIS — I69398 Other sequelae of cerebral infarction: Secondary | ICD-10-CM | POA: Diagnosis not present

## 2023-08-16 NOTE — Patient Outreach (Signed)
Care Coordination   Follow Up Visit Note   08/16/2023 Name: Amber Harris MRN: [id number] DOB: March 02, 1961  Amber Harris is a 62 y.o. year old female who sees Hasanaj, Myra Gianotti, MD for primary care. I spoke with  Amber Harris by phone today. Brief follow-up with patient regarding home health. Patient confirmed that [facility] was there when I called. She has not received a call from [facility] regarding palliative care or [facility] regarding payor provided aide services.   What matters to the patients health and wellness today?  Managing follow-up recommendations after hospitalization for suspected CVA   Goals Addressed             This Visit's Progress    Care Coordination Services   On track    Care Coordination Goals: Patient will work with [facility] HHPT Patient will work with [facility] Palliative Care services (orders placed at discharge from [facility]) [phone] Patient nor RN has received a call back Patient will work with [facility] for Personal Care Services [phone] Approved for 63 hours per month  Patient nor RN has received a call back Patient will continue to use rolling walker and cane for ambulation Patient will call [facility] to schedule hospital follow-up Referral is in and they have not been able to reach patient x 2 Patient will return call to Lenard Forth, Care Guide, regarding [employer] Meals. Telephone number provided.  Patient will seek medical attention for any new or worsening symptoms Patient can reach out to RN Care Management Coordinator at [phone] with any resource or care coordination needs        SDOH assessments and interventions completed:  No     Care Coordination Interventions:  Yes, provided  Interventions Today    Flowsheet Row Most Recent Value  Chronic Disease   Chronic disease during today's visit Hypertension (HTN), Other  [CVA]  General Interventions   General  Interventions Discussed/Reviewed General Interventions Discussed, General Interventions Reviewed, Doctor Visits, [facility], Communication with  Doctor Visits Discussed/Reviewed Doctor Visits Discussed, Doctor Visits Reviewed, PCP, Specialist  PCP/Specialist Visits Compliance with follow-up visit  [call [facility] to schedule a hospital follow-up. Referral is in place and they have not been able to reach patient.]  Communication with --  [facility] Re: Aide services]  Level of Care Personal Care Services  [Patient was approved for services but has not been outreached by agency yet.]  Exercise Interventions   Exercise Discussed/Reviewed Physical Activity  Physical Activity Discussed/Reviewed Physical Activity Reviewed, Physical Activity Discussed  [Working with [facility] when I called]  Education Interventions   Education Provided Provided Education  Provided Verbal Education On When to see the doctor  Advanced Directive Interventions   Advanced Directives Discussed/Reviewed End of Life  End of Life Palliative  [Has not been outreached by [place], palliative care]       Follow up plan: Follow up call scheduled for 08/19/23    Encounter Outcome:  Patient Visit Completed  Demetrios Loll, RN, BSN Care Management Coordinator [facility]  [facility] Direct Dial: [phone] Main #: [phone]

## 2023-08-17 ENCOUNTER — Telehealth: Payer: Self-pay

## 2023-08-17 NOTE — Telephone Encounter (Signed)
Telephone encounter was:  Successful.  08/17/2023 Name: Amber Harris MRN: [id number] DOB: Jun 26, 1961  [name] is a 62 y.o. year old female who is a primary care patient of Hasanaj, Myra Gianotti, MD . The community resource team was consulted for assistance with Food Insecurity  Care guide performed the following interventions: Patient provided with information about care guide support team and interviewed to confirm resource needs.Patient was dischared from [facility] 08/03/2023 and has needs for assistance with [facility] Meals. A call was places to [facility] and the patient will recieve meals in a few days   Follow Up Plan:  No further follow up planned at this time. The patient has been provided with needed resources.    Lenard Forth [place]  Value-Based Care Institute, [facility] Guide, Phone: [phone] Website: [email or web address]

## 2023-08-18 DIAGNOSIS — Q211 Atrial septal defect, unspecified: Secondary | ICD-10-CM | POA: Diagnosis not present

## 2023-08-18 DIAGNOSIS — Z794 Long term (current) use of insulin: Secondary | ICD-10-CM | POA: Diagnosis not present

## 2023-08-18 DIAGNOSIS — J4489 Other specified chronic obstructive pulmonary disease: Secondary | ICD-10-CM | POA: Diagnosis not present

## 2023-08-18 DIAGNOSIS — I10 Essential (primary) hypertension: Secondary | ICD-10-CM | POA: Diagnosis not present

## 2023-08-18 DIAGNOSIS — Z7902 Long term (current) use of antithrombotics/antiplatelets: Secondary | ICD-10-CM | POA: Diagnosis not present

## 2023-08-18 DIAGNOSIS — E119 Type 2 diabetes mellitus without complications: Secondary | ICD-10-CM | POA: Diagnosis not present

## 2023-08-18 DIAGNOSIS — Z79899 Other long term (current) drug therapy: Secondary | ICD-10-CM | POA: Diagnosis not present

## 2023-08-18 DIAGNOSIS — E785 Hyperlipidemia, unspecified: Secondary | ICD-10-CM | POA: Diagnosis not present

## 2023-08-18 DIAGNOSIS — I69398 Other sequelae of cerebral infarction: Secondary | ICD-10-CM | POA: Diagnosis not present

## 2023-08-18 DIAGNOSIS — M1990 Unspecified osteoarthritis, unspecified site: Secondary | ICD-10-CM | POA: Diagnosis not present

## 2023-08-18 DIAGNOSIS — I69354 Hemiplegia and hemiparesis following cerebral infarction affecting left non-dominant side: Secondary | ICD-10-CM | POA: Diagnosis not present

## 2023-08-18 DIAGNOSIS — R4781 Slurred speech: Secondary | ICD-10-CM | POA: Diagnosis not present

## 2023-08-18 DIAGNOSIS — Z7984 Long term (current) use of oral hypoglycemic drugs: Secondary | ICD-10-CM | POA: Diagnosis not present

## 2023-08-18 DIAGNOSIS — Z7951 Long term (current) use of inhaled steroids: Secondary | ICD-10-CM | POA: Diagnosis not present

## 2023-08-19 ENCOUNTER — Encounter: Payer: Self-pay | Admitting: *Deleted

## 2023-08-19 ENCOUNTER — Ambulatory Visit: Payer: Self-pay | Admitting: *Deleted

## 2023-08-20 NOTE — Patient Outreach (Signed)
Care Coordination   Follow Up Visit Note   08/19/2023 Name: Amber Harris MRN: [id number] DOB: [birth year]  Amber Harris is a 62 y.o. year old female who sees Hasanaj, Myra Gianotti, MD for primary care. I spoke with  Amber Harris by phone today.  What matters to the patients health and wellness today?  Arranging [employer]'s Meals and continuing to improve physical condition since hospital discharge.    Goals Addressed             This Visit's Progress    Care Coordination Services       Care Coordination Goals: Patient will continue working with [facility] Patient will continue to use rolling walker and cane for ambulation Patient will call [facility] to schedule hospital follow-up Referral is in and they have not been able to reach patient x 2 Patient will return call to Lenard Forth, Care Guide, regarding [employer]'s Meals. Telephone number provided.  Patient will seek medical attention for any new or worsening symptoms Patient can reach out to RN Care Management Coordinator at [phone] with any resource or care coordination needs        SDOH assessments and interventions completed:  No    Care Coordination Interventions:  Yes, provided  Interventions Today    Flowsheet Row Most Recent Value  Chronic Disease   Chronic disease during today's visit Hypertension (HTN), Other  [CVA]  General Interventions   General Interventions Discussed/Reviewed General Interventions Discussed, General Interventions Reviewed, Doctor Visits, Community Resources  [Return call to [employer] Re: Mom's Meals]  Doctor Visits Discussed/Reviewed Doctor Visits Discussed, Doctor Visits Reviewed, Specialist  Contacted provider for referral to Specialist  Macy Mis out to [facility] at [phone] to schedule an an initial appointment. Referral is in place and they have tried to reach the patient x 2 to schedule.]  Exercise Interventions   Exercise  Discussed/Reviewed Physical Activity, Exercise Discussed, Exercise Reviewed  Physical Activity Discussed/Reviewed Physical Activity Discussed, Physical Activity Reviewed  [[facility] was there with patient during my call. Continue to work with them twice a week.]  Education Interventions   Education Provided Provided Education  Provided Verbal Education On [facility], When to see the doctor  [Discussed Mom's Meals and need to see neurologist]       Follow up plan: Follow up call scheduled for 09/06/23    Encounter Outcome:  Patient Visit Completed   Demetrios Loll, RN, BSN Care Management Coordinator [facility]  [facility] Direct Dial: [phone] Main #: [phone]

## 2023-08-24 ENCOUNTER — Ambulatory Visit: Payer: Self-pay | Admitting: *Deleted

## 2023-08-24 DIAGNOSIS — E119 Type 2 diabetes mellitus without complications: Secondary | ICD-10-CM | POA: Diagnosis not present

## 2023-08-24 DIAGNOSIS — Z794 Long term (current) use of insulin: Secondary | ICD-10-CM | POA: Diagnosis not present

## 2023-08-24 DIAGNOSIS — I69354 Hemiplegia and hemiparesis following cerebral infarction affecting left non-dominant side: Secondary | ICD-10-CM | POA: Diagnosis not present

## 2023-08-24 DIAGNOSIS — Z515 Encounter for palliative care: Secondary | ICD-10-CM | POA: Diagnosis not present

## 2023-08-24 DIAGNOSIS — Z7984 Long term (current) use of oral hypoglycemic drugs: Secondary | ICD-10-CM | POA: Diagnosis not present

## 2023-08-24 DIAGNOSIS — I69398 Other sequelae of cerebral infarction: Secondary | ICD-10-CM | POA: Diagnosis not present

## 2023-08-24 DIAGNOSIS — E785 Hyperlipidemia, unspecified: Secondary | ICD-10-CM | POA: Diagnosis not present

## 2023-08-24 DIAGNOSIS — J4489 Other specified chronic obstructive pulmonary disease: Secondary | ICD-10-CM | POA: Diagnosis not present

## 2023-08-24 DIAGNOSIS — Z7951 Long term (current) use of inhaled steroids: Secondary | ICD-10-CM | POA: Diagnosis not present

## 2023-08-24 DIAGNOSIS — M1990 Unspecified osteoarthritis, unspecified site: Secondary | ICD-10-CM | POA: Diagnosis not present

## 2023-08-24 DIAGNOSIS — I10 Essential (primary) hypertension: Secondary | ICD-10-CM | POA: Diagnosis not present

## 2023-08-24 DIAGNOSIS — Q211 Atrial septal defect, unspecified: Secondary | ICD-10-CM | POA: Diagnosis not present

## 2023-08-24 DIAGNOSIS — I679 Cerebrovascular disease, unspecified: Secondary | ICD-10-CM | POA: Diagnosis not present

## 2023-08-24 DIAGNOSIS — Z7902 Long term (current) use of antithrombotics/antiplatelets: Secondary | ICD-10-CM | POA: Diagnosis not present

## 2023-08-24 DIAGNOSIS — R4781 Slurred speech: Secondary | ICD-10-CM | POA: Diagnosis not present

## 2023-08-24 DIAGNOSIS — Z79899 Other long term (current) drug therapy: Secondary | ICD-10-CM | POA: Diagnosis not present

## 2023-08-24 NOTE — Patient Outreach (Signed)
Care Coordination   Follow Up Visit Note   08/24/2023 Name: Amber Harris MRN: [id number] DOB: 08/13/1961  Amber Harris is a 62 y.o. year old female who sees Hasanaj, Myra Gianotti, MD for primary care. I spoke with  Amber Harris by phone today.  What matters to the patients health and wellness today?  Food insecurity, personal care services     Goals Addressed             This Visit's Progress    SW-care coordination activities       Patient goals Call [facility] to follow up on referral for personal care services Patient will continue to work with HHPT Patient will contact her provider's office with any questions regarding your medical care           SDOH assessments and interventions completed:  No     Care Coordination Interventions:  Yes, provided  Interventions Today    Flowsheet Row Most Recent Value  Chronic Disease   Chronic disease during today's visit Hypertension (HTN), Other  [CVA]  General Interventions   General Interventions Discussed/Reviewed General Interventions Reviewed, [facility], Level of Care, Communication with  [confirmed recent discharge from hospital following CVA, Careguide has arranged [employer] Meals-received first box 9/20 , next delivery 9/26 [facility] PT started today]  Level of Care Personal Care Services  [Confirmed that patient has been assigned to [phone] they will call patient back with start date for personal care services]  Mental Health Interventions   Mental Health Discussed/Reviewed Mental Health Reviewed  [mental health medications managed by PCP-per patient, these medications were left out of her pill pack-she has contact her PCP who has contacted pharmacy and will re-deliver]  Pharmacy Interventions   Pharmacy Dicussed/Reviewed Pharmacy Topics Discussed       Follow up plan: Follow up call scheduled for 09/07/23    Encounter Outcome:  Patient Visit Completed

## 2023-08-24 NOTE — Patient Instructions (Signed)
Visit Information  Thank you for taking time to visit with me today. Please don't hesitate to contact me if I can be of assistance to you.   Following are the goals we discussed today:   Goals Addressed             This Visit's Progress    SW-care coordination activities       Patient goals Call [facility] to follow up on referral for personal care services Patient will continue to work with HHPT Patient will contact her provider's office with any questions regarding your medical care           Our next appointment is by telephone on 09/07/23 at 2pm  Please call the care guide team at [phone] if you need to cancel or reschedule your appointment.   If you are experiencing a Mental Health or Behavioral Health Crisis or need someone to talk to, please call the Suicide and Crisis Lifeline: 988   Patient verbalizes understanding of instructions and care plan provided today and agrees to view in MyChart. Active MyChart status and patient understanding of how to access instructions and care plan via MyChart confirmed with patient.     Telephone follow up appointment with care management team member scheduled for: 09/07/23  Verna Czech, LCSW [place]  [facility]Based [facility] Institute, [facility] Licensed Clinical Social Worker Care Coordinator  Direct Dial: [phone]

## 2023-08-25 ENCOUNTER — Encounter: Payer: Self-pay | Admitting: Primary Care

## 2023-08-25 ENCOUNTER — Ambulatory Visit: Payer: 59 | Admitting: Primary Care

## 2023-08-25 VITALS — BP 96/62 | HR 55 | Temp 97.8°F | Ht 68.0 in | Wt 277.6 lb

## 2023-08-25 DIAGNOSIS — G2581 Restless legs syndrome: Secondary | ICD-10-CM | POA: Diagnosis not present

## 2023-08-25 DIAGNOSIS — R0681 Apnea, not elsewhere classified: Secondary | ICD-10-CM

## 2023-08-25 DIAGNOSIS — Z8673 Personal history of transient ischemic attack (TIA), and cerebral infarction without residual deficits: Secondary | ICD-10-CM | POA: Diagnosis not present

## 2023-08-25 DIAGNOSIS — R0683 Snoring: Secondary | ICD-10-CM

## 2023-08-25 NOTE — Patient Instructions (Addendum)
Recommendations: Monitor BP at home, if <90/60 hold blood pressure medication and call PCP Work on weight loss Focus on side sleeping position or get wedge pillow to elevate head  Keep regular bedtime and waketime; 1-2 naps a day are ok 20-30 mins in bed  Do not drive  Orders: Split night sleep study (ordered)  Follow-up: 6-8 weeks with Waynetta Sandy NP

## 2023-08-25 NOTE — Progress Notes (Signed)
Reviewed and agree with assessment/plan.   Coralyn Helling, MD [facility] Pulmonary/Critical Care 08/25/2023, 7:35 PM Pager:  [phone]
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
## 2023-08-25 NOTE — Assessment & Plan Note (Signed)
-   Patient has symptoms of loud snoring, witnessed apnea, restless leg symptoms and daytime sleepiness. Epworth 15.  BMI 42.  Strong suspicion symptoms are related to underlying obstructive sleep apnea, patient needs split-night sleep study in lab.  We reviewed risks of untreated sleep apnea including cardiac arrhythmias, pulmonary hypertension, diabetes and stroke.  We also discussed treatment options going weight loss, oral appliance, CPAP therapy referral to ENT for possible surgical options.  She is open to starting CPAP therapy if needed.  Encouraged weight loss efforts.  Advise she focus on side sleeping position.  She is no longer driving.  Follow-up in 6 to 8 weeks to review sleep study results and treatment options as needed.

## 2023-08-26 DIAGNOSIS — I10 Essential (primary) hypertension: Secondary | ICD-10-CM | POA: Diagnosis not present

## 2023-08-26 DIAGNOSIS — E119 Type 2 diabetes mellitus without complications: Secondary | ICD-10-CM | POA: Diagnosis not present

## 2023-08-26 DIAGNOSIS — I69354 Hemiplegia and hemiparesis following cerebral infarction affecting left non-dominant side: Secondary | ICD-10-CM | POA: Diagnosis not present

## 2023-08-26 DIAGNOSIS — Z794 Long term (current) use of insulin: Secondary | ICD-10-CM | POA: Diagnosis not present

## 2023-08-26 DIAGNOSIS — Q211 Atrial septal defect, unspecified: Secondary | ICD-10-CM | POA: Diagnosis not present

## 2023-08-26 DIAGNOSIS — Z7984 Long term (current) use of oral hypoglycemic drugs: Secondary | ICD-10-CM | POA: Diagnosis not present

## 2023-08-26 DIAGNOSIS — E785 Hyperlipidemia, unspecified: Secondary | ICD-10-CM | POA: Diagnosis not present

## 2023-08-26 DIAGNOSIS — Z7951 Long term (current) use of inhaled steroids: Secondary | ICD-10-CM | POA: Diagnosis not present

## 2023-08-26 DIAGNOSIS — R4781 Slurred speech: Secondary | ICD-10-CM | POA: Diagnosis not present

## 2023-08-26 DIAGNOSIS — I69398 Other sequelae of cerebral infarction: Secondary | ICD-10-CM | POA: Diagnosis not present

## 2023-08-26 DIAGNOSIS — M1990 Unspecified osteoarthritis, unspecified site: Secondary | ICD-10-CM | POA: Diagnosis not present

## 2023-08-26 DIAGNOSIS — J4489 Other specified chronic obstructive pulmonary disease: Secondary | ICD-10-CM | POA: Diagnosis not present

## 2023-08-26 DIAGNOSIS — Z7902 Long term (current) use of antithrombotics/antiplatelets: Secondary | ICD-10-CM | POA: Diagnosis not present

## 2023-08-26 DIAGNOSIS — Z79899 Other long term (current) drug therapy: Secondary | ICD-10-CM | POA: Diagnosis not present

## 2023-08-31 DIAGNOSIS — Z7902 Long term (current) use of antithrombotics/antiplatelets: Secondary | ICD-10-CM | POA: Diagnosis not present

## 2023-08-31 DIAGNOSIS — M1990 Unspecified osteoarthritis, unspecified site: Secondary | ICD-10-CM | POA: Diagnosis not present

## 2023-08-31 DIAGNOSIS — R4781 Slurred speech: Secondary | ICD-10-CM | POA: Diagnosis not present

## 2023-08-31 DIAGNOSIS — Z79899 Other long term (current) drug therapy: Secondary | ICD-10-CM | POA: Diagnosis not present

## 2023-08-31 DIAGNOSIS — J4489 Other specified chronic obstructive pulmonary disease: Secondary | ICD-10-CM | POA: Diagnosis not present

## 2023-08-31 DIAGNOSIS — E119 Type 2 diabetes mellitus without complications: Secondary | ICD-10-CM | POA: Diagnosis not present

## 2023-08-31 DIAGNOSIS — Z794 Long term (current) use of insulin: Secondary | ICD-10-CM | POA: Diagnosis not present

## 2023-08-31 DIAGNOSIS — Q211 Atrial septal defect, unspecified: Secondary | ICD-10-CM | POA: Diagnosis not present

## 2023-08-31 DIAGNOSIS — Z7951 Long term (current) use of inhaled steroids: Secondary | ICD-10-CM | POA: Diagnosis not present

## 2023-08-31 DIAGNOSIS — E785 Hyperlipidemia, unspecified: Secondary | ICD-10-CM | POA: Diagnosis not present

## 2023-08-31 DIAGNOSIS — I10 Essential (primary) hypertension: Secondary | ICD-10-CM | POA: Diagnosis not present

## 2023-08-31 DIAGNOSIS — Z7984 Long term (current) use of oral hypoglycemic drugs: Secondary | ICD-10-CM | POA: Diagnosis not present

## 2023-08-31 DIAGNOSIS — I69354 Hemiplegia and hemiparesis following cerebral infarction affecting left non-dominant side: Secondary | ICD-10-CM | POA: Diagnosis not present

## 2023-08-31 DIAGNOSIS — I69398 Other sequelae of cerebral infarction: Secondary | ICD-10-CM | POA: Diagnosis not present

## 2023-09-06 ENCOUNTER — Ambulatory Visit: Payer: Self-pay | Admitting: *Deleted

## 2023-09-06 DIAGNOSIS — E785 Hyperlipidemia, unspecified: Secondary | ICD-10-CM | POA: Diagnosis not present

## 2023-09-06 DIAGNOSIS — Z7951 Long term (current) use of inhaled steroids: Secondary | ICD-10-CM | POA: Diagnosis not present

## 2023-09-06 DIAGNOSIS — Z7902 Long term (current) use of antithrombotics/antiplatelets: Secondary | ICD-10-CM | POA: Diagnosis not present

## 2023-09-06 DIAGNOSIS — M1990 Unspecified osteoarthritis, unspecified site: Secondary | ICD-10-CM | POA: Diagnosis not present

## 2023-09-06 DIAGNOSIS — I69398 Other sequelae of cerebral infarction: Secondary | ICD-10-CM | POA: Diagnosis not present

## 2023-09-06 DIAGNOSIS — I69354 Hemiplegia and hemiparesis following cerebral infarction affecting left non-dominant side: Secondary | ICD-10-CM | POA: Diagnosis not present

## 2023-09-06 DIAGNOSIS — Q211 Atrial septal defect, unspecified: Secondary | ICD-10-CM | POA: Diagnosis not present

## 2023-09-06 DIAGNOSIS — J4489 Other specified chronic obstructive pulmonary disease: Secondary | ICD-10-CM | POA: Diagnosis not present

## 2023-09-06 DIAGNOSIS — Z794 Long term (current) use of insulin: Secondary | ICD-10-CM | POA: Diagnosis not present

## 2023-09-06 DIAGNOSIS — Z7984 Long term (current) use of oral hypoglycemic drugs: Secondary | ICD-10-CM | POA: Diagnosis not present

## 2023-09-06 DIAGNOSIS — Z79899 Other long term (current) drug therapy: Secondary | ICD-10-CM | POA: Diagnosis not present

## 2023-09-06 DIAGNOSIS — I10 Essential (primary) hypertension: Secondary | ICD-10-CM | POA: Diagnosis not present

## 2023-09-06 DIAGNOSIS — H16223 Keratoconjunctivitis sicca, not specified as Sjogren's, bilateral: Secondary | ICD-10-CM | POA: Diagnosis not present

## 2023-09-06 DIAGNOSIS — E119 Type 2 diabetes mellitus without complications: Secondary | ICD-10-CM | POA: Diagnosis not present

## 2023-09-06 DIAGNOSIS — H35033 Hypertensive retinopathy, bilateral: Secondary | ICD-10-CM | POA: Diagnosis not present

## 2023-09-06 DIAGNOSIS — R4781 Slurred speech: Secondary | ICD-10-CM | POA: Diagnosis not present

## 2023-09-07 ENCOUNTER — Encounter: Payer: Self-pay | Admitting: *Deleted

## 2023-09-07 ENCOUNTER — Telehealth: Payer: Self-pay | Admitting: *Deleted

## 2023-09-07 NOTE — Patient Outreach (Signed)
Care Coordination   09/07/2023 Name: Amber Harris MRN: [id number] DOB: 12/25/1960   Care Coordination Outreach Attempts:  An unsuccessful telephone outreach was attempted today to offer the patient information about available care coordination services.  Follow Up Plan:  Additional outreach attempts will be made to offer the patient care coordination information and services.   Encounter Outcome:  No Answer   Care Coordination Interventions:  No, not indicated    Hailly Fess, LCSW [place]  [facility], [facility] Licensed Clinical Social Worker Care Coordinator  Direct Dial: [phone]

## 2023-09-07 NOTE — Patient Outreach (Signed)
Care Coordination   Follow Up Visit Note   09/06/2023 Name: Amber Harris MRN: [id number] DOB: Jan 08, 1961  Amber Harris is a 62 y.o. year old female who sees Hasanaj, Myra Gianotti, MD for primary care. I spoke with  Amber Harris by phone today.  What matters to the patients health and wellness today?  Seeing ophthalmologist and following up on patient reported "blood clot" behind eye.     Goals Addressed             This Visit's Progress    Care Coordination Services   On track    Care Coordination Goals: Patient will continue working with [facility] Patient will continue to use rolling walker and cane for ambulation Patient will keep appointment with [facility] for 11/09/23 Patient will keep appointment with [facility] on 10/10/23 Patient will talk with [facility] Re: Personal Care Services.  Meet & Greet scheduled in two weeks. It was previously scheduled for 09/05/23 but had to be rescheduled.  Patient will seek medical attention for any new or worsening symptoms Patient can reach out to RN Care Management Coordinator at [phone] with any resource or care coordination needs     Manage Blood Pressure   On track    Care Coordination Goals: Continue to take Hydralazine 50mg  as directed Patient will monitor and record blood pressure daily and as needed and will call PCP or specialist with any readings outside of recommended range Patient will follow a low sodium/DASH diet Patient will reach out to RN Care Coordinator [phone] with any care coordination or resource needs          SDOH assessments and interventions completed:  Yes  SDOH Interventions Today    Flowsheet Row Most Recent Value  SDOH Interventions   Food Insecurity Interventions Other (Comment), Intervention Not Indicated  [Patient has food stamps and U[employer]. There was an isolated incident where she went a week without those resources available, but she  doesn't routinely have that problem. During that period of time she was provided with information on local food resources.]  Transportation Interventions Patient Resources (Friends/Family), Payor Benefit        Care Coordination Interventions:  Yes, provided  Interventions Today    Flowsheet Row Most Recent Value  Chronic Disease   Chronic disease during today's visit Hypertension (HTN), Other  [CVA]  General Interventions   General Interventions Discussed/Reviewed General Interventions Discussed, General Interventions Reviewed, Annual Eye Exam, Durable Medical Equipment (DME), Doctor Visits, Level of Care  [Patient reports having a blood clot behind her right eye. She is following up with opthalmologist today and he is in communication with her PCP. No reports to review.]  Doctor Visits Discussed/Reviewed Doctor Visits Discussed, Doctor Visits Reviewed, PCP, Specialist  Durable Medical Equipment (DME) BP Cuff, Walker  [BP today 144/75]  PCP/Specialist Visits Compliance with follow-up visit  [opthalmologist this afternoon. pulmonologist 10/10/23, neurologist 11/09/23, cardiologist 12/2023]  Level of Care Personal Care Services  [facility] (based in [place]) had arranged a visit for 09/05/23 but had to cancel due to Covid illness amongst staff. It has been rescheduled for 2 weeks from now.]  Exercise Interventions   Exercise Discussed/Reviewed Exercise Discussed, Exercise Reviewed, Physical Activity, Assistive device use and maintanence  Physical Activity Discussed/Reviewed Physical Activity Discussed, Physical Activity Reviewed  [using rolling walker for ambulation. Able to perform ADLs. Working with HHPT.]  Education Interventions   Education Provided Provided Education  Provided Engineer, petroleum On When to  see the doctor, [employer], Nutrition, Other, Medication, Exercise  [blood pressure monitoring. Confirmed that she has received Mom's Meals as requested.]  Nutrition  Interventions   Nutrition Discussed/Reviewed Nutrition Discussed, Nutrition Reviewed, Decreasing salt, Fluid intake  [Eat 3 meals a day. Follow heart healthy, low sodium diet.]  Pharmacy Interventions   Pharmacy Dicussed/Reviewed Pharmacy Topics Discussed, Pharmacy Topics Reviewed, Medications and their functions  [Taking medications as prescribed. Increased risk of bleeding due to Plavix.]  Safety Interventions   Safety Discussed/Reviewed Safety Discussed, Safety Reviewed, Fall Risk, Home Safety  [Has HHPT. Using rolling walker. Did have a mechanical fall in her laundry room on 10/6 where she hit her head. No LOC. Has lingering mild headahce. No vision changes. No nausea or vomiting. Legs feel weaker than usual and noticed change in speech.]  Home Safety Assistive Devices  [HHPT recommended ED evaluation. I reinforced this recommendation during our 1st call since she her speech was objectively slower & more measured than usual. No slurring. Pt declined  said she would consider after Appt. with opthalmologist at 2:10.] I placed a 2nd call to patient after her ophthalmology appointment. Objectively she sounded like her normal self and speech pattern was normal. Subjectively reported feeling much better and back to normal. Discussed potential for TIA and CVA given her history and risk for brain bleed due to Plavix and importance of following-up with provider regarding recent fall. Stressed importance of calling 911 and evaluation at ED for new, recurrent, or worsening symptoms. Patient stated understanding but declined additional assessment at this time.   Advanced Directive Interventions   Advanced Directives Discussed/Reviewed Advanced Directives Discussed, Advanced Directives Reviewed, End of Life  End of Life Palliative  [Has not been outreached for palliative care. Does have home health and personal care services lined up. Will put palliative care on hold for now.]       Follow up plan: Follow up  call scheduled for 09/08/23    Encounter Outcome:  Patient Visit Completed   Demetrios Loll, RN, BSN Care Management Coordinator [facility]  [facility] Direct Dial: [phone] Main #: [phone]

## 2023-09-08 ENCOUNTER — Telehealth: Payer: Self-pay | Admitting: *Deleted

## 2023-09-08 ENCOUNTER — Ambulatory Visit: Payer: Self-pay | Admitting: *Deleted

## 2023-09-08 NOTE — Patient Instructions (Signed)
Visit Information  Thank you for taking time to visit with me today. Please don't hesitate to contact me if I can be of assistance to you.   Following are the goals we discussed today:   Please contact [facility] to follow up on ongoing schedule for in home care Patient will continue to work with HHPT Patient will contact her provider's office with any questions regarding your medical care  If you are experiencing a Mental Health or Behavioral Health Crisis or need someone to talk to, please call the Suicide and Crisis Lifeline: 988   Patient verbalizes understanding of instructions and care plan provided today and agrees to view in MyChart. Active MyChart status and patient understanding of how to access instructions and care plan via MyChart confirmed with patient.     No further follow up required: patient to contact this Child psychotherapist with any additional questions or concerns  [employer], [employer] [place]  [facility], [facility] Licensed Clinical Social Geologist, engineering Dial: [phone]

## 2023-09-08 NOTE — Patient Outreach (Addendum)
Care Coordination   Follow Up Visit Note   09/08/2023 Name: Amber Harris MRN: [id number] DOB: [birth year]  Amber Harris is a 62 y.o. year old female who sees Hasanaj, Myra Gianotti, MD for primary care. I spoke with  Amber Harris by phone today.  What matters to the patients health and wellness today?  Food insecurity addressed, patient confirms no concerns at this time states having a sufficient supply. Patient further confirms the start of personal care services today. Aid present in her home for 3 hours today. Patient's son remains supportive, he picks her up daily for visits in the evenings, Patient denies having any additional community resource needs at this  time.   Goals Addressed             This Visit's Progress    COMPLETED: community resources for food insecurity       Interventions Today    Flowsheet Row Most Recent Value  Chronic Disease   Chronic disease during today's visit Hypertension (HTN), Other  [CVA]  General Interventions   General Interventions Discussed/Reviewed General Interventions Reviewed, Community Resources  Level of Care Personal Care Services  [Confirmed that aid from [facility] did start today-worked in the home for 3 hours-pt will need to confirm follow up schedule]  Mental Health Interventions   Mental Health Discussed/Reviewed Mental Health Reviewed  [mental health medications continue to be managed by PCP-sent separately-not in pill pack yet-confirmed that she does have all medications now]  Nutrition Interventions   Nutrition Discussed/Reviewed Nutrition Discussed  [Patient confirms that she has received 1 box of Moms Meals-son continues to assist with meals when needed-confirms having a sufficient supply of food at this time.]           COMPLETED: SW-care coordination activities       Patient goals Call [facility] to follow up on referral for personal care services Patient will continue to work with  HHPT Patient will contact her provider's office with any questions regarding your medical care           SDOH assessments and interventions completed:  No     Care Coordination Interventions:  Yes, provided   Follow up plan: No further intervention required.   Encounter Outcome:  Patient Visit Completed

## 2023-09-08 NOTE — Patient Outreach (Signed)
Care Coordination   09/08/2023 Name: Amber Harris MRN: [id number] DOB: 02-07-1961   Care Coordination Outreach Attempts:  An unsuccessful telephone outreach was attempted for a scheduled appointment today.  Follow Up Plan:  Additional outreach attempts will be made to offer the patient care coordination information and services.   Encounter Outcome:  No Answer. Unable to leave VM.   Care Coordination Interventions:  No, not indicated. Existing telephone appointment for Care Management follow-up is scheduled for 09/27/23.    Demetrios Loll, RN, BSN Care Management Coordinator [facility]  [facility] Direct Dial: [phone] Main #: [phone]

## 2023-09-12 DIAGNOSIS — I69398 Other sequelae of cerebral infarction: Secondary | ICD-10-CM | POA: Diagnosis not present

## 2023-09-12 DIAGNOSIS — I10 Essential (primary) hypertension: Secondary | ICD-10-CM | POA: Diagnosis not present

## 2023-09-12 DIAGNOSIS — Z79899 Other long term (current) drug therapy: Secondary | ICD-10-CM | POA: Diagnosis not present

## 2023-09-12 DIAGNOSIS — E785 Hyperlipidemia, unspecified: Secondary | ICD-10-CM | POA: Diagnosis not present

## 2023-09-12 DIAGNOSIS — Z7902 Long term (current) use of antithrombotics/antiplatelets: Secondary | ICD-10-CM | POA: Diagnosis not present

## 2023-09-12 DIAGNOSIS — Z7984 Long term (current) use of oral hypoglycemic drugs: Secondary | ICD-10-CM | POA: Diagnosis not present

## 2023-09-12 DIAGNOSIS — E119 Type 2 diabetes mellitus without complications: Secondary | ICD-10-CM | POA: Diagnosis not present

## 2023-09-12 DIAGNOSIS — Q211 Atrial septal defect, unspecified: Secondary | ICD-10-CM | POA: Diagnosis not present

## 2023-09-12 DIAGNOSIS — R4781 Slurred speech: Secondary | ICD-10-CM | POA: Diagnosis not present

## 2023-09-12 DIAGNOSIS — I69354 Hemiplegia and hemiparesis following cerebral infarction affecting left non-dominant side: Secondary | ICD-10-CM | POA: Diagnosis not present

## 2023-09-12 DIAGNOSIS — Z7951 Long term (current) use of inhaled steroids: Secondary | ICD-10-CM | POA: Diagnosis not present

## 2023-09-12 DIAGNOSIS — M1990 Unspecified osteoarthritis, unspecified site: Secondary | ICD-10-CM | POA: Diagnosis not present

## 2023-09-12 DIAGNOSIS — Z794 Long term (current) use of insulin: Secondary | ICD-10-CM | POA: Diagnosis not present

## 2023-09-12 DIAGNOSIS — J4489 Other specified chronic obstructive pulmonary disease: Secondary | ICD-10-CM | POA: Diagnosis not present

## 2023-09-20 DIAGNOSIS — Q211 Atrial septal defect, unspecified: Secondary | ICD-10-CM | POA: Diagnosis not present

## 2023-09-20 DIAGNOSIS — Z7984 Long term (current) use of oral hypoglycemic drugs: Secondary | ICD-10-CM | POA: Diagnosis not present

## 2023-09-20 DIAGNOSIS — J4489 Other specified chronic obstructive pulmonary disease: Secondary | ICD-10-CM | POA: Diagnosis not present

## 2023-09-20 DIAGNOSIS — Z7902 Long term (current) use of antithrombotics/antiplatelets: Secondary | ICD-10-CM | POA: Diagnosis not present

## 2023-09-20 DIAGNOSIS — I69398 Other sequelae of cerebral infarction: Secondary | ICD-10-CM | POA: Diagnosis not present

## 2023-09-20 DIAGNOSIS — E119 Type 2 diabetes mellitus without complications: Secondary | ICD-10-CM | POA: Diagnosis not present

## 2023-09-20 DIAGNOSIS — E785 Hyperlipidemia, unspecified: Secondary | ICD-10-CM | POA: Diagnosis not present

## 2023-09-20 DIAGNOSIS — I69354 Hemiplegia and hemiparesis following cerebral infarction affecting left non-dominant side: Secondary | ICD-10-CM | POA: Diagnosis not present

## 2023-09-20 DIAGNOSIS — M1990 Unspecified osteoarthritis, unspecified site: Secondary | ICD-10-CM | POA: Diagnosis not present

## 2023-09-20 DIAGNOSIS — R4781 Slurred speech: Secondary | ICD-10-CM | POA: Diagnosis not present

## 2023-09-20 DIAGNOSIS — Z79899 Other long term (current) drug therapy: Secondary | ICD-10-CM | POA: Diagnosis not present

## 2023-09-20 DIAGNOSIS — Z7951 Long term (current) use of inhaled steroids: Secondary | ICD-10-CM | POA: Diagnosis not present

## 2023-09-20 DIAGNOSIS — I10 Essential (primary) hypertension: Secondary | ICD-10-CM | POA: Diagnosis not present

## 2023-09-20 DIAGNOSIS — Z794 Long term (current) use of insulin: Secondary | ICD-10-CM | POA: Diagnosis not present

## 2023-09-22 ENCOUNTER — Other Ambulatory Visit: Payer: Self-pay | Admitting: Cardiology

## 2023-09-26 DIAGNOSIS — E785 Hyperlipidemia, unspecified: Secondary | ICD-10-CM | POA: Diagnosis not present

## 2023-09-26 DIAGNOSIS — R4781 Slurred speech: Secondary | ICD-10-CM | POA: Diagnosis not present

## 2023-09-26 DIAGNOSIS — Z515 Encounter for palliative care: Secondary | ICD-10-CM | POA: Diagnosis not present

## 2023-09-26 DIAGNOSIS — I69398 Other sequelae of cerebral infarction: Secondary | ICD-10-CM | POA: Diagnosis not present

## 2023-09-26 DIAGNOSIS — Z7984 Long term (current) use of oral hypoglycemic drugs: Secondary | ICD-10-CM | POA: Diagnosis not present

## 2023-09-26 DIAGNOSIS — I69354 Hemiplegia and hemiparesis following cerebral infarction affecting left non-dominant side: Secondary | ICD-10-CM | POA: Diagnosis not present

## 2023-09-26 DIAGNOSIS — I10 Essential (primary) hypertension: Secondary | ICD-10-CM | POA: Diagnosis not present

## 2023-09-26 DIAGNOSIS — J4489 Other specified chronic obstructive pulmonary disease: Secondary | ICD-10-CM | POA: Diagnosis not present

## 2023-09-26 DIAGNOSIS — Z7951 Long term (current) use of inhaled steroids: Secondary | ICD-10-CM | POA: Diagnosis not present

## 2023-09-26 DIAGNOSIS — E119 Type 2 diabetes mellitus without complications: Secondary | ICD-10-CM | POA: Diagnosis not present

## 2023-09-26 DIAGNOSIS — Z794 Long term (current) use of insulin: Secondary | ICD-10-CM | POA: Diagnosis not present

## 2023-09-26 DIAGNOSIS — M1990 Unspecified osteoarthritis, unspecified site: Secondary | ICD-10-CM | POA: Diagnosis not present

## 2023-09-26 DIAGNOSIS — Q211 Atrial septal defect, unspecified: Secondary | ICD-10-CM | POA: Diagnosis not present

## 2023-09-26 DIAGNOSIS — Z7902 Long term (current) use of antithrombotics/antiplatelets: Secondary | ICD-10-CM | POA: Diagnosis not present

## 2023-09-26 DIAGNOSIS — I679 Cerebrovascular disease, unspecified: Secondary | ICD-10-CM | POA: Diagnosis not present

## 2023-09-26 DIAGNOSIS — Z79899 Other long term (current) drug therapy: Secondary | ICD-10-CM | POA: Diagnosis not present

## 2023-09-27 ENCOUNTER — Encounter: Payer: Self-pay | Admitting: *Deleted

## 2023-09-27 ENCOUNTER — Ambulatory Visit: Payer: Self-pay | Admitting: *Deleted

## 2023-09-27 NOTE — Patient Outreach (Signed)
Care Coordination   Follow Up Visit Note   09/27/2023 Name: ORALEE Harris MRN: [id number] DOB: August 27, 1961  Amber Harris is a 62 y.o. year old female who sees Hasanaj, Myra Gianotti, MD for primary care. I spoke with  Amber Harris by phone today.  What matters to the patients health and wellness today?  Having Personal Care Services Regularly    Goals Addressed             This Visit's Progress    Care Coordination Services   On track    Care Coordination Goals: Patient will continue working with [facility] Last session will be this Friday, 09/30/23 Patient will continue to use rolling walker and cane for ambulation Patient will keep appointment with [facility] for 11/09/23 Patient will keep appointment with  Pulmonary on 10/10/23 Patient will talk with [facility] Re: Personal Care Services this week Patient will seek medical attention for any new or worsening symptoms Patient can reach out to RN Care Management Coordinator at [phone] with any resource or care coordination needs     Manage Blood Pressure   On track    Care Coordination Goals: Patient will monitor and record blood pressure daily and as needed and will call PCP or specialist with any readings outside of recommended range Patient will follow a low sodium/DASH diet Patient will reach out to RN Care Coordinator [phone] with any care coordination or resource needs       COMPLETED: Manage Tooth Pain       Care Coordination Goals: Patient will reach out to dentist with any new symptoms No pain or discomfort at this time Patient will follow-up with oral surgeon as recommended for retained root after tooth broke off during attempted extraction Has left messages for several practices and is awaiting a call back Call RN Care Coordinator at [phone] with any resource or care coordination needs         SDOH assessments and interventions completed:   Yes  SDOH Interventions Today    Flowsheet Row Most Recent Value  SDOH Interventions   Food Insecurity Interventions Intervention Not Indicated, Other (Comment)  [Patient has food stamps and UCard. She has enough food to last this month.]  Transportation Interventions Patient Resources (Friends/Family), Payor Benefit  Physical Activity Interventions Other (Comments)  [working with HHPT]        Care Coordination Interventions:  Yes, provided  Interventions Today    Flowsheet Row Most Recent Value  Chronic Disease   Chronic disease during today's visit Hypertension (HTN), Other  [h/o CVA]  General Interventions   General Interventions Discussed/Reviewed General Interventions Discussed, General Interventions Reviewed, Doctor Visits, Level of Care, Communication with  [Overall patient is feeling better and has not had any concerning episodes since our last telephone call.]  Doctor Visits Discussed/Reviewed Doctor Visits Discussed, Doctor Visits Reviewed, Specialist  [she did follow-up with opthalmologist and per patient's report, the blood clot behind her eye resolved. No visual disturbances.]  Durable Medical Equipment (DME) Walker, BP Cuff  PCP/Specialist Visits Compliance with follow-up visit  [pulmonologist 10/10/23, neurologist 11/09/23, cardiologist 12/2023]  Communication with --  [facility] call with patient to [facility] [phone] Re: initial home visit for PCP services. She was scheduled on 09/05/23 but the agency cancelled. She was rescheduled, but no one came. They plan to call her back this week with a date.]  Level of Care Personal Care Services  [LCSW note on 09/08/23 confirmed that aide had  started.Today patient states she hasn't heard from them. It's possible that they did do an initial visit and aide services on 10/10 but she hasn't heard from them since. She may want to switch agencies.]  Exercise Interventions   Exercise Discussed/Reviewed Exercise Discussed,  Exercise Reviewed, Physical Activity, Assistive device use and maintanence  Physical Activity Discussed/Reviewed Physical Activity Discussed, Physical Activity Reviewed  [working with HHPT. Feels stronger. last visit is scheduled for 09/30/23]  Education Interventions   Education Provided Provided Education  Provided Verbal Education On When to see the doctor, Nutrition, Exercise, Medication, Mental Health/Coping with Illness  [blood pressure monitoring]  Mental Health Interventions   Mental Health Discussed/Reviewed Mental Health Discussed, Mental Health Reviewed, Other  [Patient feels that she is managing well at this time]  Nutrition Interventions   Nutrition Discussed/Reviewed Nutrition Discussed, Nutrition Reviewed, Decreasing salt  [Receiving Mom's Meals. Food resources are adequate at this time.]  Pharmacy Interventions   Pharmacy Dicussed/Reviewed Pharmacy Topics Discussed, Pharmacy Topics Reviewed, Medications and their functions  [taking medications as prescribed]  Safety Interventions   Safety Discussed/Reviewed Safety Discussed, Safety Reviewed, Fall Risk, Home Safety  Home Safety Assistive Devices       Follow up plan: Follow up call scheduled for 10/05/23    Encounter Outcome:  Patient Visit Completed   Demetrios Loll, RN, BSN Care Management Coordinator [facility]  [facility] Direct Dial: [phone] Main #: [phone]

## 2023-10-03 DIAGNOSIS — E119 Type 2 diabetes mellitus without complications: Secondary | ICD-10-CM | POA: Diagnosis not present

## 2023-10-03 DIAGNOSIS — R4781 Slurred speech: Secondary | ICD-10-CM | POA: Diagnosis not present

## 2023-10-03 DIAGNOSIS — Z79899 Other long term (current) drug therapy: Secondary | ICD-10-CM | POA: Diagnosis not present

## 2023-10-03 DIAGNOSIS — I69354 Hemiplegia and hemiparesis following cerebral infarction affecting left non-dominant side: Secondary | ICD-10-CM | POA: Diagnosis not present

## 2023-10-03 DIAGNOSIS — I10 Essential (primary) hypertension: Secondary | ICD-10-CM | POA: Diagnosis not present

## 2023-10-03 DIAGNOSIS — M1990 Unspecified osteoarthritis, unspecified site: Secondary | ICD-10-CM | POA: Diagnosis not present

## 2023-10-03 DIAGNOSIS — J4489 Other specified chronic obstructive pulmonary disease: Secondary | ICD-10-CM | POA: Diagnosis not present

## 2023-10-03 DIAGNOSIS — I69398 Other sequelae of cerebral infarction: Secondary | ICD-10-CM | POA: Diagnosis not present

## 2023-10-03 DIAGNOSIS — Z7951 Long term (current) use of inhaled steroids: Secondary | ICD-10-CM | POA: Diagnosis not present

## 2023-10-03 DIAGNOSIS — Z794 Long term (current) use of insulin: Secondary | ICD-10-CM | POA: Diagnosis not present

## 2023-10-03 DIAGNOSIS — Q211 Atrial septal defect, unspecified: Secondary | ICD-10-CM | POA: Diagnosis not present

## 2023-10-03 DIAGNOSIS — E785 Hyperlipidemia, unspecified: Secondary | ICD-10-CM | POA: Diagnosis not present

## 2023-10-03 DIAGNOSIS — Z7902 Long term (current) use of antithrombotics/antiplatelets: Secondary | ICD-10-CM | POA: Diagnosis not present

## 2023-10-03 DIAGNOSIS — Z7984 Long term (current) use of oral hypoglycemic drugs: Secondary | ICD-10-CM | POA: Diagnosis not present

## 2023-10-05 ENCOUNTER — Ambulatory Visit: Payer: Self-pay | Admitting: *Deleted

## 2023-10-05 NOTE — Patient Outreach (Signed)
  Care Coordination   10/05/2023 Name: Amber Harris MRN: [id number] DOB: May 25, 1961   Care Coordination Outreach Attempts:  An unsuccessful telephone outreach was attempted for a scheduled appointment today.  Follow Up Plan:  Additional outreach attempts will be made to offer the patient care coordination information and services.   Encounter Outcome:  No Answer   Care Coordination Interventions:  No, not indicated. Staff message sent to care guide requesting outreach and rescheduling.     Demetrios Loll, RN, BSN Care Management Coordinator [facility]  [facility] Direct Dial: [phone] Main #: [phone]

## 2023-10-09 ENCOUNTER — Telehealth: Payer: Self-pay | Admitting: Primary Care
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
## 2023-10-09 NOTE — Telephone Encounter (Signed)
Can we follow-up on split night sleep study that was ordered on this patient, doesn't look like it was done/scheduled.  Thanks

## 2023-10-10 ENCOUNTER — Ambulatory Visit: Payer: 59 | Admitting: Primary Care

## 2023-10-10 ENCOUNTER — Telehealth: Payer: Self-pay | Admitting: *Deleted

## 2023-10-10 NOTE — Telephone Encounter (Signed)
Thank you :)

## 2023-10-10 NOTE — Telephone Encounter (Signed)
Sleep study has been scheduled

## 2023-10-10 NOTE — Telephone Encounter (Signed)
Called and spoke with patient/family member regarding appointment for today.  Advised that there is no need for her to be seen today as she has not had her split night sleep study.  I provided her with the number for the [facility] sleep center to call to get scheduled.  Advised to call after she is scheduled to make a a follow up 1 week out. She verbalized understanding.  Nothing further needed.

## 2023-10-11 DIAGNOSIS — M1990 Unspecified osteoarthritis, unspecified site: Secondary | ICD-10-CM | POA: Diagnosis not present

## 2023-10-11 DIAGNOSIS — Z7902 Long term (current) use of antithrombotics/antiplatelets: Secondary | ICD-10-CM | POA: Diagnosis not present

## 2023-10-11 DIAGNOSIS — J4489 Other specified chronic obstructive pulmonary disease: Secondary | ICD-10-CM | POA: Diagnosis not present

## 2023-10-11 DIAGNOSIS — Z794 Long term (current) use of insulin: Secondary | ICD-10-CM | POA: Diagnosis not present

## 2023-10-11 DIAGNOSIS — Q211 Atrial septal defect, unspecified: Secondary | ICD-10-CM | POA: Diagnosis not present

## 2023-10-11 DIAGNOSIS — R4781 Slurred speech: Secondary | ICD-10-CM | POA: Diagnosis not present

## 2023-10-11 DIAGNOSIS — Z7984 Long term (current) use of oral hypoglycemic drugs: Secondary | ICD-10-CM | POA: Diagnosis not present

## 2023-10-11 DIAGNOSIS — E785 Hyperlipidemia, unspecified: Secondary | ICD-10-CM | POA: Diagnosis not present

## 2023-10-11 DIAGNOSIS — I69354 Hemiplegia and hemiparesis following cerebral infarction affecting left non-dominant side: Secondary | ICD-10-CM | POA: Diagnosis not present

## 2023-10-11 DIAGNOSIS — I69398 Other sequelae of cerebral infarction: Secondary | ICD-10-CM | POA: Diagnosis not present

## 2023-10-11 DIAGNOSIS — Z7951 Long term (current) use of inhaled steroids: Secondary | ICD-10-CM | POA: Diagnosis not present

## 2023-10-11 DIAGNOSIS — E119 Type 2 diabetes mellitus without complications: Secondary | ICD-10-CM | POA: Diagnosis not present

## 2023-10-11 DIAGNOSIS — Z79899 Other long term (current) drug therapy: Secondary | ICD-10-CM | POA: Diagnosis not present

## 2023-10-11 DIAGNOSIS — I10 Essential (primary) hypertension: Secondary | ICD-10-CM | POA: Diagnosis not present

## 2023-10-18 DIAGNOSIS — E7849 Other hyperlipidemia: Secondary | ICD-10-CM | POA: Diagnosis not present

## 2023-10-18 DIAGNOSIS — Z8673 Personal history of transient ischemic attack (TIA), and cerebral infarction without residual deficits: Secondary | ICD-10-CM | POA: Diagnosis not present

## 2023-10-18 DIAGNOSIS — E1122 Type 2 diabetes mellitus with diabetic chronic kidney disease: Secondary | ICD-10-CM | POA: Diagnosis not present

## 2023-10-18 DIAGNOSIS — M5459 Other low back pain: Secondary | ICD-10-CM | POA: Diagnosis not present

## 2023-10-18 DIAGNOSIS — I10 Essential (primary) hypertension: Secondary | ICD-10-CM | POA: Diagnosis not present

## 2023-10-18 DIAGNOSIS — N182 Chronic kidney disease, stage 2 (mild): Secondary | ICD-10-CM | POA: Diagnosis not present

## 2023-11-01 ENCOUNTER — Ambulatory Visit (HOSPITAL_BASED_OUTPATIENT_CLINIC_OR_DEPARTMENT_OTHER): Payer: 59 | Admitting: Pulmonary Disease

## 2023-11-01 DIAGNOSIS — Z515 Encounter for palliative care: Secondary | ICD-10-CM | POA: Diagnosis not present

## 2023-11-01 DIAGNOSIS — I679 Cerebrovascular disease, unspecified: Secondary | ICD-10-CM | POA: Diagnosis not present

## 2023-11-09 ENCOUNTER — Ambulatory Visit: Payer: 59 | Admitting: Neurology

## 2023-11-09 ENCOUNTER — Encounter: Payer: Self-pay | Admitting: Neurology

## 2023-11-13 DIAGNOSIS — R9082 White matter disease, unspecified: Secondary | ICD-10-CM | POA: Diagnosis not present

## 2023-11-13 DIAGNOSIS — Z8673 Personal history of transient ischemic attack (TIA), and cerebral infarction without residual deficits: Secondary | ICD-10-CM | POA: Diagnosis not present

## 2023-11-13 DIAGNOSIS — Z87891 Personal history of nicotine dependence: Secondary | ICD-10-CM | POA: Diagnosis not present

## 2023-11-13 DIAGNOSIS — Z743 Need for continuous supervision: Secondary | ICD-10-CM | POA: Diagnosis not present

## 2023-11-13 DIAGNOSIS — I639 Cerebral infarction, unspecified: Secondary | ICD-10-CM | POA: Diagnosis not present

## 2023-11-13 DIAGNOSIS — I6502 Occlusion and stenosis of left vertebral artery: Secondary | ICD-10-CM | POA: Diagnosis not present

## 2023-11-13 DIAGNOSIS — Z978 Presence of other specified devices: Secondary | ICD-10-CM | POA: Diagnosis not present

## 2023-11-13 DIAGNOSIS — D696 Thrombocytopenia, unspecified: Secondary | ICD-10-CM | POA: Diagnosis not present

## 2023-11-13 DIAGNOSIS — R29711 NIHSS score 11: Secondary | ICD-10-CM | POA: Diagnosis not present

## 2023-11-13 DIAGNOSIS — I6381 Other cerebral infarction due to occlusion or stenosis of small artery: Secondary | ICD-10-CM | POA: Diagnosis not present

## 2023-11-13 DIAGNOSIS — Z794 Long term (current) use of insulin: Secondary | ICD-10-CM | POA: Diagnosis not present

## 2023-11-13 DIAGNOSIS — R29700 NIHSS score 0: Secondary | ICD-10-CM | POA: Diagnosis not present

## 2023-11-13 DIAGNOSIS — E785 Hyperlipidemia, unspecified: Secondary | ICD-10-CM | POA: Diagnosis not present

## 2023-11-13 DIAGNOSIS — J449 Chronic obstructive pulmonary disease, unspecified: Secondary | ICD-10-CM | POA: Diagnosis not present

## 2023-11-13 DIAGNOSIS — I350 Nonrheumatic aortic (valve) stenosis: Secondary | ICD-10-CM | POA: Diagnosis not present

## 2023-11-13 DIAGNOSIS — R404 Transient alteration of awareness: Secondary | ICD-10-CM | POA: Diagnosis not present

## 2023-11-13 DIAGNOSIS — F17210 Nicotine dependence, cigarettes, uncomplicated: Secondary | ICD-10-CM | POA: Diagnosis not present

## 2023-11-13 DIAGNOSIS — I351 Nonrheumatic aortic (valve) insufficiency: Secondary | ICD-10-CM | POA: Diagnosis not present

## 2023-11-13 DIAGNOSIS — E119 Type 2 diabetes mellitus without complications: Secondary | ICD-10-CM | POA: Diagnosis not present

## 2023-11-13 DIAGNOSIS — I6523 Occlusion and stenosis of bilateral carotid arteries: Secondary | ICD-10-CM | POA: Diagnosis not present

## 2023-11-13 DIAGNOSIS — G8194 Hemiplegia, unspecified affecting left nondominant side: Secondary | ICD-10-CM | POA: Diagnosis not present

## 2023-11-13 DIAGNOSIS — E876 Hypokalemia: Secondary | ICD-10-CM | POA: Diagnosis not present

## 2023-11-13 DIAGNOSIS — R270 Ataxia, unspecified: Secondary | ICD-10-CM | POA: Diagnosis not present

## 2023-11-13 DIAGNOSIS — Z79899 Other long term (current) drug therapy: Secondary | ICD-10-CM | POA: Diagnosis not present

## 2023-11-13 DIAGNOSIS — I6389 Other cerebral infarction: Secondary | ICD-10-CM | POA: Diagnosis not present

## 2023-11-13 DIAGNOSIS — R6889 Other general symptoms and signs: Secondary | ICD-10-CM | POA: Diagnosis not present

## 2023-11-13 DIAGNOSIS — R471 Dysarthria and anarthria: Secondary | ICD-10-CM | POA: Diagnosis not present

## 2023-11-13 DIAGNOSIS — Z7902 Long term (current) use of antithrombotics/antiplatelets: Secondary | ICD-10-CM | POA: Diagnosis not present

## 2023-11-13 DIAGNOSIS — R519 Headache, unspecified: Secondary | ICD-10-CM | POA: Diagnosis not present

## 2023-11-13 DIAGNOSIS — I672 Cerebral atherosclerosis: Secondary | ICD-10-CM | POA: Diagnosis not present

## 2023-11-13 DIAGNOSIS — Z880 Allergy status to penicillin: Secondary | ICD-10-CM | POA: Diagnosis not present

## 2023-11-13 DIAGNOSIS — E1165 Type 2 diabetes mellitus with hyperglycemia: Secondary | ICD-10-CM | POA: Diagnosis not present

## 2023-11-13 DIAGNOSIS — I6621 Occlusion and stenosis of right posterior cerebral artery: Secondary | ICD-10-CM | POA: Diagnosis not present

## 2023-11-13 DIAGNOSIS — R531 Weakness: Secondary | ICD-10-CM | POA: Diagnosis not present

## 2023-11-13 DIAGNOSIS — Z7409 Other reduced mobility: Secondary | ICD-10-CM | POA: Diagnosis not present

## 2023-11-13 DIAGNOSIS — I10 Essential (primary) hypertension: Secondary | ICD-10-CM | POA: Diagnosis not present

## 2023-11-13 DIAGNOSIS — Z9641 Presence of insulin pump (external) (internal): Secondary | ICD-10-CM | POA: Diagnosis not present

## 2023-11-13 DIAGNOSIS — I3481 Nonrheumatic mitral (valve) annulus calcification: Secondary | ICD-10-CM | POA: Diagnosis not present

## 2023-11-13 DIAGNOSIS — Z5941 Food insecurity: Secondary | ICD-10-CM | POA: Diagnosis not present

## 2023-11-13 DIAGNOSIS — J4489 Other specified chronic obstructive pulmonary disease: Secondary | ICD-10-CM | POA: Diagnosis not present

## 2023-11-13 DIAGNOSIS — R29717 NIHSS score 17: Secondary | ICD-10-CM | POA: Diagnosis not present

## 2023-11-13 NOTE — H&P (Signed)
 [facility]  History and Physical   Assessment / Plan  - Neurological:  1) Acute right Stroke.  Initial NIHSS: 11  - The patient did meet the criteria for IV TNK and IV TNK was given 11/13/23 at 1542  - No LVO, not thrombectomy candidate  - Admit to Neuroscience Intensive Care Unit - Obtain additional neuroimaging: MRI brain - 24 Head CT 12/16 at 1600 to evaluate for bleeding after IV TNK and/or mechanical intervention - Hold antiplatelet therapy or anticoagulation until 24hr Head CT or MRI brain. - Blood pressure goal: BP < 180/105 after IV TNK - DVT prophylaxis: Sequential compression devices - Consult to Stroke Navigator - Dysphagia screen: Will initiate diet if passes swallowing evaluation or consider placing NG tube when appropriate - Therapy: Speech Therapy, Occupational Therapy, and Physical Therapy - Vital Signs per unit routine - Telemetry - Code Status Full Code  2) Acute Headache - PRN Tylenol    3) Impaired Mobility - PT/OT when appropriate  4) Social / Ethics - Case management consult  5) Depression 6) Schizophrenia  - Continue home c  - Cardiovascular:  1) Hypertension, essential 2) Previous MI - Goal SBP < 180 - Cardene gtt if needed for goal  - Hold home clonidine  - PRN hydralazine  and labetalol  for goal - EKG ordered  - Echo ordered   - Respiratory:  1) COPD, Hx 2) Asthma, Hx - No active issues  - 98% on RA - CXR - Continue home albuterol , Trelegy  - Gastroenteric:  1) Dysarthria - Nurse to perform bedside dysphagia screening  - Advance to a Consistent carbohydrate diet as tolerated  - Diet: Consistent Carb - PPI: Pepcid - CCM Bowel Protocol - Last BM Date:  (PTA)   - Endocrine:  1) Diabetes, Insulin  dependent - A1c ordered  - BG on unit admission  - Glucose surveillance AC/HS - Remove insulin  pump, will cover with SSI  - Renal/Electrolytes:  - CMP ordered  - BUN 14, Cr 0.75 - I&O - IVF: ML - BMP,  Mag, Phos daily   - Infectious:  - Abx: N/A - WBC 5.8 - Afebrile, Tmax 97.9 - Culture with temp > = 101.5 - CBC daily   - Hematologic:  1) Thrombocytopenia - Hgb 13.4, Plts 148 - CBC daily   - Prophylaxis: Sequential Compression Device  - Disposition: Neuro ICU  - Mobility: CCM Mobility Protocol  - Lines: PIVs  - This patient is critically ill due to Stroke s/p IV TNK with numerous comorbidities and at significant risk of neurological worsening, respiratory failure, bleeding, infection, and Risk of bleeding s/p IV TNK and Hemodynamic instability. This patient's care requires constant monitoring of vital signs, hemodynamics, respiratory and cardiac monitoring, review of multiple databases, neurological assessment, discussion with family, other specialists and medical decision making of high complexity.  History  History of Present Illness Amber Harris is a 62 y.o. female with a history of IDDM, arthritis, asthma, ASD, COPD, depression, MI, HTN, schizophrenia, stroke. She presented to [facility] on 11/13/2023 with left sided weakness increased from normal, dysarthria, confusion. She had been Christmas shopping,. She takes Plavix  daily. CT head was negative for hemorrhage. No LVO on CTA. She was a candidate for TNK,and consent was obtained from her son. She was transferred to [facility] for post-TNK care.   On admission to [facility], pt is alert and oriented. She has left sided weakness with ataxia. Speech is clear. She states she ambulates with a cane and lives alone.  The patient denies headache, nausea, vomiting, numbness, tingling, vision or memory problems.     HPI: Chief Complaint: Stroke  No past medical history on file. No past surgical history on file.  Allergies  Allergen Reactions  . Aspirin  Hives  . Fish Allergy Unknown  . Penicillins Rash  . Prior to Admission medications   Medication Sig Start Date End Date Taking? Authorizing Provider  ACCU-CHEK GUIDE test strip   05/06/20   Historical Provider, MD  albuterol  (PROVENTIL ) 2.5 mg/0.5 mL nebulizer solution Inhale 2.5 mg into the lungs.    Historical Provider, MD  aspirin  81 MG EC tablet Take 81 mg by mouth.    Historical Provider, MD  chlorthalidone  (HYGROTEN) 50 MG tablet Take 50 mg by mouth daily. 05/01/20   Historical Provider, MD  citalopram hydrobromide (CELEXA) 20 mg tablet Take 20 mg by mouth daily. 03/28/20   Historical Provider, MD  clonidine  (CATAPRES ) 0.3 mg tablet Take 0.3 mg by mouth 3 (three) times a day. 04/17/20   Historical Provider, MD  clopidogrel  bisulfate (PLAVIX ) 75 mg tablet  02/20/20   Historical Provider, MD  DULoxetine  HCl (CYMBALTA ) 30 mg capsule  02/20/20   Historical Provider, MD  estradiol  (ESTRACE ) 1 mg tablet Take 1 mg by mouth daily. 04/17/20   Historical Provider, MD  furosemide  (LASIX ) 40 mg tablet Take 40 mg by mouth daily. 04/17/20   Historical Provider, MD  gabapentin  (NEURONTIN ) 300 mg capsule  02/20/20   Historical Provider, MD  insulin  glargine (LANTUS ,BASALGLAR) 100 units/mL pen Inject into the skin.    Historical Provider, MD  JANUMET  50-500 MG per tablet Take 1 tablet by mouth 2 (two) times daily. 04/18/20   Historical Provider, MD  JARDIANCE  25 MG TABS tablet Take 25 mg by mouth daily. 04/17/20   Historical Provider, MD  lisinopril  (PRINIVIL ,ZESTRIL ) 20 mg tablet Take 20 mg by mouth.    Historical Provider, MD  mirtazapine (REMERON) 15 mg tablet Take 15 mg by mouth.    Historical Provider, MD  mometasone furoate & formoterol fumarate (DULERA) 100-5 mcg/act AERO inhaler Inhale into the lungs.    Historical Provider, MD  ondansetron  (ZOFRAN ) 4 mg tablet SMARTSIG:Tablet(s) By Mouth 05/05/20   Historical Provider, MD  phenol (CHLORASEPTIC) 1.4% LIQD Use as directed two sprays in the mouth or throat every 4 (four) hours as needed. 05/18/20   Franky GORMAN Desanctis, MD  risperiDONE  (RISPERDAL ) 0.5 mg tablet Take 0.5 mg by mouth at bedtime. 05/05/20   Historical Provider, MD  rosuvastatin  calcium   (CRESTOR ) 40 mg tablet Take 40 mg by mouth daily. 05/05/20   Historical Provider, MD  topiramate  (TOPAMAX ) 50 MG tablet Take 50 mg by mouth 2 (two) times daily. 05/05/20   Historical Provider, MD  TRESIBA  FLEXTOUCH 100 UNIT/ML injection SMARTSIG:SUB-Q 03/25/20   Historical Provider, MD  verapamil  (CALAN -SR) 240 mg ER tablet Take 240 mg by mouth daily. 04/18/20   Historical Provider, MD   Social History   Socioeconomic History  . Marital status: Single  Tobacco Use  . Smoking status: Every Day    Current packs/day: 0.25    Types: Cigarettes  . Smokeless tobacco: Never  Vaping Use  . Vaping status: Never Used  Substance and Sexual Activity  . Alcohol use: Not Currently  . Drug use: Yes    Types: Marijuana   No family history on file.  Review of Systems - Negative except as stated above in the HPI Physical Examination  Temp:  [97.9 F (36.6 C)] 97.9 F (36.6  C) Heart Rate:  [46-59] 52 Resp:  [10-24] 10 BP: (125-175)/(58-113) 125/60 SpO2:  [95 %-99 %] 98 % Pain Score: 0-No pain O2 Device: None (Room air)    No chief complaint on file.   BP 125/60 (BP Location: Right Lower Arm, Patient Position: Lying)   Pulse 52   Temp 97.9 F (36.6 C) (Oral)   Resp 10   Ht 1.727 m (5' 8)   Wt 125.5 kg (276 lb 10.8 oz)   SpO2 98%   BMI 42.07 kg/m   GCS: Eye Opening: Spontaneous Verbal Response: Oriented Best Motor Response: Obey commands Glasgow Coma Scale Score: 15 Glasgow Coma Scale Breakdown: E=4, V=5, M=6  General:  Well Developed, Well Nourished, and No acute distress Head, Eyes, Ears, Nose, Throat: Normocephalic, atraumatic, Conjunctiva normal, Oropharynx moist and clear Neck:  Supple with normal range of motion, No lymphadenopathy  Cardiovascular:  Regular, rate, and rhythm Lungs:  Clear to auscultation bilaterally Abdomen:  Bowel sounds Present, Soft, and Non-tender Skin:  No Focal Rashes  Extremities:  No clubbing, cyanosis, or edema.  Neurologic: Mental Status: Awake,  Alert, and Follows commands Speech: Normal Orientation to: Person, Place, and Time CN II-XII: normal Visual: Full to finger counting Pupils: Equal, Right Pupil 3 mm, Left Pupil 3 mm, and Reactive EOM: Intact Corneal Reflex: Not tested in this awake patient Face: Symmetric Cough/gag Reflex: Not tested in this awake patient Motor: RUE/RLE 5/5, LUE + drift, ataxia, 4-/5, LLE + drift, ataxia, 4/5 and Left Hemiparesis Sensation: Left normal and Right normal Reflexes: Not Tested Toes: Not tested Coordination: Ataxia Present Gait: Unable to assess   Results  Labs:  Recent Results (from the past 24 hour(s))  POCT Glucose   Collection Time: 11/13/23  7:24 PM  Result Value Ref Range   Glucose, POC 91 70 - 99 mg/dL   OPERATOR ID [id number]    INSTRUMENT ID [id number]   CBC   Collection Time: 11/13/23  8:10 PM  Result Value Ref Range   WBC 5.8 4.0 - 10.5 thou/mcL   RBC 4.31 3.93 - 5.22 million/mcL   HGB 13.4 11.2 - 15.7 gm/dL   HCT 59.0 65.8 - 55.0 %   MCV 94.9 (H) 79.4 - 94.8 fL   MCH 31.1 25.6 - 32.2 pg   MCHC 32.8 32.2 - 35.5 gm/dL   Plt Ct 851 (L) 849 - 400 thou/mcL   RDW SD 44.2 35.1 - 46.3 fL   MPV 10.6 9.4 - 12.4 fL   NRBC% 0.0 0.0 - 0.2 /100WBC   Absolute NRBC Count 0.00 0.00 - 0.01 thou/mcL  Comprehensive Metabolic Panel   Collection Time: 11/13/23  8:10 PM  Result Value Ref Range   Na 139 136 - 146 mmol/L   Potassium 3.0 (L) 3.7 - 5.4 mmol/L   Cl 103 97 - 108 mmol/L   CO2 26 20 - 32 mmol/L   AGAP 10 7 - 16 mmol/L   Glucose 98 65 - 99 mg/dL   BUN 14 8 - 27 mg/dL   Creatinine 9.24 9.42 - 1.00 mg/dL   Ca 8.9 8.6 - 89.7 mg/dL   ALK PHOS 57 25 - 834 U/L   T Bili 0.8 0.0 - 1.2 mg/dL   Total Protein 7.1 6.0 - 8.5 gm/dL   Alb 3.5 (L) 3.6 - 4.8 gm/dL   GLOBULIN 3.6 1.5 - 4.5 gm/dL   ALBUMIN/GLOBULIN RATIO 1.0 (L) 1.1 - 2.5   BUN/CREAT RATIO 18.7 11.0 - 26.0   ALT 20 0 -  40 U/L   AST 20 0 - 40 U/L   eGFR 91 >=60 mL/min/1.13m2  Lipid Panel   Collection Time: 11/13/23   8:10 PM  Result Value Ref Range   CHOLESTEROL TOTAL 108 100 - 199 mg/dL   Trig 72 0 - 850 mg/dL   LDL 39 0 - 99 mg/dL   VLDL 14 5 - 40 mg/dl   CHOL/HDL 2 0 - 5   HDL 55 >=39 mg/dL  Protime-INR   Collection Time: 11/13/23  8:10 PM  Result Value Ref Range   PT 12.4 11.8 - 14.3 second(s)   INR 0.9 See Therapeutic ranges  PTT   Collection Time: 11/13/23  8:10 PM  Result Value Ref Range   PTT 28 22 - 35 second(s)  TSH   Collection Time: 11/13/23  8:10 PM  Result Value Ref Range   TSH 0.78 0.45 - 4.50 mcIU/mL  Free T4   Collection Time: 11/13/23  8:10 PM  Result Value Ref Range   Free T4 0.95 0.82 - 1.77 ng/dL   Imaging: CT Outside Films  Result Date: 11/13/2023 Patient Name:  Amber Harris Date of Birth:  [birth year]  female Interpretation Procedure was imported into PACS for comparison purposes only.   CT Outside Films  Result Date: 11/13/2023 Patient Name:  Amber Harris Date of Birth:  04-02-1961  female Interpretation Procedure was imported into PACS for comparison purposes only.   ECG: ECG 12 lead  Result Date: 11/13/2023 Diagnosis Class Abnormal Acquisition Device D3K Systolic BP 175 Diastolic BP 69 Ventricular Rate 48 Atrial Rate 48 P-R Interval 184 QRS Duration 86 Q-T Interval 534 QTC Calculation(Bazett) 477 Calculated P Axis 60 Calculated R Axis 79 Calculated T Axis -3 Diagnosis Sinus bradycardia Nonspecific T wave abnormality Abnormal ECG No previous ECGs available Tommas Cough ([id number]) on 11/13/2023 8:13:18 PM certifies that he/she has reviewed the ECG tracing and confirms the  independent interpretation is correct.   All labs and images reviewed by Dr. Cathaleen  Plan d/w and agreed by Dr. Cathaleen  Total Critical Care time spent: 50 minutes.  This time includes review of data, examination of the patient, formulation of plan.  This time is separate from any billable procedures.   Electronically signed: Asberry Samuels, FNP 11/13/2023 / 11:09 PM

## 2023-11-15 NOTE — Progress Notes (Signed)
 [facility]  Neuro ICU Progress Note  Hosp Day#:  2    Vent Day#:         Code status: Full Code Assessment/Plan  - Neurological:  1) Acute Right hemispheric Stroke.  Initial NIHSS: 11             - The patient did meet criteria for IV TNK and IV TNK was given 11/13/23 at 1542             - No LVO, not thrombectomy candidate - Obtain additional neuroimaging: MRI brain - 24 Head CT 12/16 at 1600: no acute intracranial abnormality. Old infarcts noted - Start ASA and subq heparin  - Blood pressure goal: BP < 160, MAP>65 - DVT prophylaxis: Sequential compression devices - Therapy: Speech Therapy, Occupational Therapy, and Physical Therapy - Vital Signs per unit routine - Telemetry - Code Status Full Code   2) Acute Headache - PRN Tylenol     3) Impaired Mobility - PT/OT   4) Social / Ethics - Case management consult   5) Depression 6) Schizophrenia             - Continue risperdal    - Cardiovascular:  1) Hypertension, essential 2) Previous MI - Goal SBP < 160 - Cardene gtt if needed for goal  - Hold home clonidine  - PRN hydralazine  and labetalol  for goal - EKG sinus bradycardia - Echo pending   - Respiratory:  1) COPD, Hx 2) Asthma, Hx - No active issues  - 98% on RA - CXR - Continue home albuterol , Trelegy   - Gastroenteric:   - Diet: Consistent Carb - PPI: Pepcid - CCM Bowel Protocol  - Last BM Date: 11/14/23  - Endocrine:  1) Diabetes, Insulin  dependent - A1c 11.1 - BG on unit admission  - Glucose surveillance AC/HS - Remove insulin  pump, will cover with SSI   - Renal/Electrolytes:  - CMP ordered  - BUN 16, Cr 0.7 - I&O -353 - IVF: ML - BMP, Mag, Phos daily    - Infectious:  - Abx: N/A - WBC 4.8 - Afebrile, Tmax 97.9 - Culture with temp > = 101.5 - CBC daily    - Hematologic:  1) Thrombocytopenia - Hgb 11.8, Plts 148 - CBC daily    - Prophylaxis: Sequential Compression Device  - Disposition: May transfer to  floor  - Mobility: CCM Mobility Protocol  - Lines: PIVs   Diet:  Consistent Carbohydrate; Percent Meals Eaten (%): 100 % Infusions:    Intake/Output:  Intake/Output Summary (Last 24 hours) at 11/15/2023 1044 Last data filed at 11/15/2023 0955 Gross per 24 hour  Intake 1036.74 ml  Output 1550 ml  Net -513.26 ml   Medications:  . aspirin   81 mg Oral Daily  . magnesium hydroxide  30 mL Oral ONCE   Followed by  . bisacodyl  10 mg Rectal ONCE   Followed by  . magnesium citrate  150 mL Oral ONCE  . budesonide-formoterol  2 puff Inhalation BID RSP  . citalopram hydrobromide  20 mg Oral Daily  . docusate sodium   100 mg Oral BID  . ezetimibe   10 mg Oral Daily  . gabapentin   300 mg Oral TID  . heparin   5,000 Units Subcutaneous Q8H 05-12-21  . insulin  glargine  15 Units Subcutaneous HS  . insulin  lispro  1-6 Units Subcutaneous AC&HS  . insulin  lispro  8 Units Subcutaneous Meals  . oxybutynin  5 mg Oral Daily  . perflutren  lipid  microspheres   IntraVENous ONCE  . polyethylene glycol  17 g Oral Daily  . risperiDONE   0.5 mg Oral HS  . rosuvastatin  calcium   40 mg Oral Daily   PRN meds: acetaminophen  Oral **OR** acetaminophen  (TYLENOL ) oral liquid Tube **OR** acetaminophen  Rectal,albuterol  Inhalation,dextrose  Oral **OR** dextrose  IntraVENous **OR** dextrose  IntraVENous **OR** glucagon  Intramuscular,labetalol  IntraVENous,ondansetron  IntraVENous,sodium phosphate  Rectal  HPI: Chief Complaint: Stroke s/p IV TNK  62 y.o. female with a history of IDDM, arthritis, asthma, ASD, COPD, depression, MI, HTN, schizophrenia, stroke. She presented to [facility] on 11/13/2023 with left sided weakness increased from normal, dysarthria, confusion. She had been Christmas shopping,. She takes Plavix  daily. CT head was negative for hemorrhage. No LVO on CTA. She was a candidate for TNK,and consent was obtained from her son. She was transferred to [facility] for post-TNK care.   Review of Systems - as per  HPI  Physical Examination  Vent:     O2 Device: None (Room air)  Temp:  [98.8 F (37.1 C)-99.4 F (37.4 C)] 98.8 F (37.1 C) Heart Rate:  [50-74] 64 Resp:  [11-24] 17 BP: (118-180)/(59-104) 123/60 SpO2:  [95 %-100 %] 97 %  Pain Score: 0-No pain  RASS: [place] Agitation Sedation Scale (RASS): Alert and calm  GCS: Eye Opening: To sound Verbal Response: Oriented Best Motor Response: Obey commands Glasgow Coma Scale Score: 14 Glasgow Coma Scale Breakdown: E=3, V=5, M=6  Last Weight  11/13/23 125.5 kg (276 lb 10.8 oz)   Intakes & Outputs (Last 24 hours) at 11/15/2023 0700 Last data filed at 11/15/2023 0400 24hr Volume @0700   Intake 1396.74 mL  Output 1750 mL  Net -353.26 mL  In: 1396.7 [P.O.:1320; I.V.:76.7] Out: 1750 [Urine:1750]      General:  in NAD Head, Eyes, Ears, Nose, Throat: Normocephalic, atraumatic, Conjunctiva normal, Oropharynx moist and clear Neck:  Supple with normal range of motion, No lymphadenopathy  Cardiovascular:  Regular, rate, and rhythm Lungs:  Clear to auscultation bilaterally Abdomen:  Bowel sounds Present, Soft, and Non-tender Skin:  No Focal Rashes  Extremities:  No clubbing, cyanosis, or edema.  Neurologic: Mental Status: Awake, Alert, and Follows commands Speech: Normal Orientation to: Person, Place, and Time CN II-XII: normal Visual: Full to finger counting Pupils: Equal, Right Pupil 3 mm, Left Pupil 3 mm, and Reactive EOM: Intact Corneal Reflex: Not tested in this awake patient Face: Symmetric Cough/gag Reflex: Not tested in this awake patient Motor:  RUE/RLE 5/5, mild Left Hemiparesis Sensation: Left normal and Right normal Reflexes: Not Tested Toes: Not tested Coordination: Ataxia Present Gait: Unable to assess  Patient Lines/Drains/Airways Status     Active Lines     Name Placement date Placement time Site Days   Peripheral IV 20 G Left Antecubital 11/13/23  --  Antecubital  1   Peripheral IV 22 G Left;Posterior Hand  11/13/23  --  Hand  1           Results  All labs and images reviewed  Total time spent: 38 minutes.  This time includes review of data, examination of the patient, formulation of plan.  This time is separate from any billable procedures.  Electronically signed: Burman LULLA Irving, MD 11/15/2023 / 10:44 AM

## 2023-11-16 NOTE — Discharge Summary (Addendum)
 [facility] Stroke Discharge Summary  PCP: ORPHA YANCEY LABOR, MD Discharge Details  Amber Harris is a 62 y.o. female Date of Birth: 09-Oct-1961 Admit Date: 11/13/2023 Discharge Date: 11/16/2023  Hospital LOS: 4 Attending Physician: No att. providers found  Discharge Diagnosis  Primary Diagnosis: Ischemic stroke (*)  TNK averted right hemispheric ischemic stroke  Secondary Diagnoses: Active Hospital Problems   Diagnosis Date Noted POA  . *Ischemic stroke (*) 11/13/2023 Yes  . Hypokalemia 11/16/2023 Unknown    Resolved Hospital Problems  No resolved problems to display.    Hospital Course   History of Present Illness 62 y.o. female with a history of IDDM, arthritis, asthma, ASD, COPD, depression, MI, HTN, schizophrenia, stroke. She presented to [facility] on 11/13/2023 with left sided weakness increased from normal, dysarthria, confusion. She had been Christmas shopping,. She takes Plavix  daily. CT head was negative for hemorrhage. No LVO on CTA. She was a candidate for TNK,and consent was obtained from her son. She was transferred to [facility] for post-TNK care.   Patient remained stable during hospital course, with continued improvement in her left hemiparesis.  Initially evaluated by therapy services, with recommendation for inpatient rehab.  Due to her prior bad experience in a facility similar to this, she would prefer to go home with home health therapy.  This seems appropriate as the patient has made a significant improvement of her initial stroke symptoms.  Decision was discussed with the patient's son on the phone, who is also in agreement. During hospital course, patient also treated for hypokalemia with oral supplementation. Patient discharged in stable condition on 11/16/2023.  Stroke Workup This Admission:  Assessment: TNK averted right hemispheric ischemic stroke  - Acute Stroke Treatment Provided: Received TNK on 11/13/23  - Etiology of Stroke:  Small Vessel/Lacunar - Stroke Risk Factors: Hypertension, Hyperlipidemia, Diabetes, Obesity, and Prior Stroke/TIA   - Initial NIHSS: 11  - Discharge NIHSS: 0  - Initial Modified Rankin Scale 1 (No significant disability despite symptoms, able to carry out all usual duties/activities) - Discharge Modified Rankin Scale 3 (Moderate disability, requiring some help, but able to walk without assistance) -  PHQ9       11/14/2023  Depression Screen  1. Little interest or pleasure in doing things 0  2. Feeling down, depressed, or hopeless 0  PHQ-2 Total Score 0  3. Trouble falling or staying asleep 0  4. Feeling tired or having little energy 0  5. Poor appetite or overeating 0  6. Feeling bad about yourself - or that you are a failure or have let yourself or your family down 0  7. Trouble concentrating on things, such as reading the newspaper or watching television 0  8. Moving or speaking so slowly that other people could have noticed.  Or the opposite - being so fidgety or restless that you have been moving around a lot more than usual. 0  9. Thoughts that you would be better off dead, or of hurting yourself in some way. 0  PHQ Total Score 0  Interpretation: Minimal or No Depression  PHQ-9 Interpretation of Score for Depression (Payor) Negative    Details            Secondary stroke prevention: - Antiplatelet/Anticoagulation therapy upon discharge- The patient was continued on monotherapy Aspirin  81 mg daily. No indication for Plavix ..  - Statin therapy- She continued on Crestor  10 mg daily ezetimibe  10 mg orally at bedtime.  Stroke Workup This Admission:  - Initial  CT Head: Unremarkable  - MRI Brain: 1. No acute intracranial abnormality appreciated. 2. Mild scattered T2/FLAIR hyperintensities within the cerebral white matter. While nonspecific, favor chronic small vessel ischemic/age-related changes in a patient this age. 3. Old infarcts within the deep white matter adjacent to the  body of the corpus callosum, right pons and right cerebellum. 4. Empty sella.  - Vessel Imaging: CTA H/N showed no critical stenosis or LVO  - Echocardiogram (TTE): Left ventricle size is normal. Wall thickness is normal. EF: 55-60%. Wall motion is normal. Doppler parameters indicate normal diastolic function. Left atrium size is normal. Injection of agitated saline documents no interatrial shunt. The leaflets are mildly thickened and exhibit normal excursion. There is trace regurgitation. There is no evidence of mitral valve stenosis.  - LDL: 39  - A1c: 11.1%   Long-Term Secondary Stroke Prevention Goals & Recommendations: -Goal blood pressure < 130/80 mmHg  -Goal LDL < 70 -Goal Hemoglobin J8r < 7  -Smoking Cessation  -Low-Sodium Mediterranean diet  -Medication Compliance  -Exercise for 30 minutes/day at least 5 days/week, or as tolerated given medical co-morbidities and level of function -PT/OT Recommendations: Initially IPR, patient preferred home with home PT/OT and single point cane   Other medical problems addressed during this hospitalization:  - Cardiovascular:  1) Hypertension, essential  - Restarted on home antihypertensive medications 2) Previous MI - Goal SBP < 130/80 mm Hg - PRN hydralazine  and labetalol  for goal - EKG sinus bradycardia - Echo pending   - Respiratory:  1) COPD, Hx 2) Asthma, Hx - No active issues  - 98% on RA - CXR - Continue home albuterol , Trelegy   - Gastroenteric:    - Diet: Consistent Carb - PPI: Pepcid - CCM Bowel Protocol             - Last BM Date: 11/14/23   - Endocrine:  1) Diabetes, Insulin  dependent - A1c 11.1 - Diabetic management team consulted - Glucose surveillance AC/HS - Remove insulin  pump, will cover with SSI   - Renal/Electrolytes:  - Renal function stable   - Infectious:  - Abx: N/A - Afebrile  - Hematologic:  1) Thrombocytopenia - Hgb 11.8, Plts 148 - CBC daily .   Follow up: She will need close follow up  with a primary care physician and outpatient neurologist after discharge for optimal and continued vascular risk factor assessment and reduction.  Follow up with [facility] was arranged.  Discharge Medications   Discharge Medication List as of 11/16/2023  3:01 PM     DISCONTINUED medications     clonidine  (CATAPRES ) 0.3 mg tablet      clopidogrel  bisulfate (PLAVIX ) 75 mg tablet      DULoxetine  HCl (CYMBALTA ) 30 mg capsule      insulin  glargine (LANTUS ,BASALGLAR) 100 units/mL pen      JANUMET  50-500 MG per tablet      lisinopril  (PRINIVIL ,ZESTRIL ) 20 mg tablet      mometasone furoate & formoterol fumarate (DULERA) 100-5 mcg/act AERO inhaler      phenol (CHLORASEPTIC) 1.4% LIQD      topiramate  (TOPAMAX ) 50 MG tablet      verapamil  (CALAN -SR) 240 mg ER tablet        CHANGED medications   Details  rosuvastatin  calcium  (CRESTOR ) 10 mg tablet Take one tablet (10 mg dose) by mouth at bedtime., Starting Wed 11/16/2023, Normal       CONTINUED medications   Details  ACCU-CHEK GUIDE test strip Historical Med  Albuterol  Sulfate (PROAIR  RESPICLICK) 108 (90 Base) MCG/ACT AEPB inhaler Inhale into the lungs as needed., Historical Med    aspirin  81 MG EC tablet Take one tablet (81 mg dose) by mouth daily., Historical Med    chlorthalidone  (HYGROTEN) 50 MG tablet Take one tablet (50 mg dose) by mouth daily., Starting Thu 05/01/2020, Historical Med    citalopram hydrobromide (CELEXA) 20 mg tablet Take one tablet (20 mg dose) by mouth daily., Starting Fri 03/28/2020, Historical Med    Continuous Glucose Receiver (DEXCOM G7 RECEIVER) DEVI see administration instructions., Starting Thu 10/20/2023, Historical Med    Continuous Glucose Sensor (DEXCOM G7 SENSOR) MISC CHANGE EVERY 10 DAYS, Historical Med    ezetimibe  (ZETIA ) 10 MG tablet Take one tablet (10 mg dose) by mouth daily., Starting Fri 10/21/2023, Historical Med    fish oil (SEA-OMEGA) 1000 mg CAPS Take one capsule  (1,000 mg dose) by mouth 3 (three) times a day., Historical Med    fluticasone -umeclidin-vilant (TRELEGY ELLIPTA) 100-62.5-25 MCG/ACT AEPB inhaler Inhale one puff into the lungs daily., Historical Med    furosemide  (LASIX ) 20 mg tablet Take one tablet (20 mg dose) by mouth daily as needed (as needed for swelling)., Starting Thu 04/17/2020, Historical Med    gabapentin  (NEURONTIN ) 300 mg capsule Take one capsule (300 mg dose) by mouth 3 (three) times a day., Starting Wed 02/20/2020, Historical Med    HUMALOG 100 UNIT/ML injection Uses with insulin  pump. Max daily dose 100 units., Starting Sat 10/29/2023, Historical Med    hydrALAzine  HCl (APRESOLINE ) 50 mg tablet Take one tablet (50 mg dose) by mouth 2 (two) times daily., Starting Fri 10/21/2023, Historical Med    Insulin  Disposable Pump (OMNIPOD DASH PDM, GEN 4,) KIT see administration instructions. Gen 4, Starting Wed 10/19/2023, Historical Med    Insulin  Disposable Pump (OMNIPOD DASH PODS, GEN 4,) MISC Change every 48 hours, Historical Med    JARDIANCE  25 MG TABS tablet Take one tablet (25 mg dose) by mouth daily., Starting Thu 04/17/2020, Historical Med    losartan potassium (COZAAR) 100 mg tablet Take one tablet (100 mg dose) by mouth daily., Starting Fri 10/21/2023, Historical Med    methocarbamol  (ROBAXIN ) 500 MG tablet Take one tablet (500 mg dose) by mouth at bedtime., Starting Fri 10/21/2023, Historical Med    mirtazapine (REMERON) 15 mg tablet Take one tablet (15 mg dose) by mouth., Historical Med    omeprazole  (PRILOSEC) 20 mg capsule Take one capsule (20 mg dose) by mouth every morning., Starting Fri 10/21/2023, Historical Med    ondansetron  (ZOFRAN ) 4 mg tablet Take one tablet (4 mg dose) by mouth every 12 (twelve) hours as needed for Nausea. X 5 days, Historical Med    oxybutynin (DITROPAN-XL) 5 MG 24 hr tablet Take one tablet (5 mg dose) by mouth daily., Starting Fri 10/21/2023, Historical Med    risperiDONE  (RISPERDAL ) 0.5  mg tablet Take one tablet (0.5 mg dose) by mouth daily. At noon, Starting Mon 05/05/2020, Historical Med    TRESIBA  FLEXTOUCH 100 UNIT/ML injection Inject twenty one Units into the skin at bedtime as needed., Historical Med        Physical Exam  BP (!) 162/76 (Patient Position: Sitting) Comment: told the nurse  Pulse 64   Temp 98.3 F (36.8 C) (Oral)   Resp 17   Ht 5' 8 (1.727 m)   Wt 276 lb 10.8 oz (125.5 kg)   SpO2 96%   BMI 42.07 kg/m   Discharge NIH Stroke Scale 1a. Level of  Consciousness 0-Alert 1b. Level of Consciousness Questions 0-Answers both questions correctly 1c. Level of Consciousness Commands 0-Performs both tasks correctly 2. Best Gaze 0-Normal 3. Visual 0-No visual loss 4. Facial Palsy 0-Normal 5a. Motor Arm Left 0-No drift 5b. Motor Arm Right 0-No drift 6a. Motor Leg Left 0-No drift 6b. Motor Leg Right 0-No drift 7. Ataxia 0-Absent 8. Sensory 0-Normal 9. Best Language 0-No aphasia 10. Dysarthria 0-Normal 11. Extinction and Inattention 0-No abnormality  NIHSS Total: 0  Discharge Neurologic Examination  Mental Status: Orientation: Alert  Attention/Concentration: Intact Fund of Knowledge: Intact Language: Intact fluency, Intact comprehension, Intact repetition, Intact naming, and Intact reading Speech: Fluent without dysarthria    Cranial Nerves:  Pupils: Equal and Reactive  Visual Fields: (R) Full to confrontation; (L) Full to confrontation Optic Disc: Poorly visualized  CN III, IV, VI (Extra-Ocular Movements): EOMI and Normal saccades CN V: Normal sensation in V1, V2, V3 bilaterally CN VII: Normal, symmetric facial muscle strength bilaterally CN VIII: Auditory acuity intact to bedside testing CN IX/X: Normal palate elevation CN XI: Normal strength of bilateral trapezius and SCM muscles  CN XII: Tongue protrudes midline  Motor:  Strength: 5/5 x 4 extremities Bulk:: Age-Appropriate atrophy  Tone: Normal in the upper and lower  extremities Involuntary Movements (asterixis, tremor, etc): Absent  Pronator Drift: Absent  Sensory: Light Touch: Intact in all 4 extremities  No extinction to double simultaneous stimulation    Reflexes:  Plantar Response: Not tested  Coordination: Finger to Nose: No dysmetria Heel to Shin: No dysmetria  Gait: Not assessed in the emergent setting  Labs   Recent Results (from the past 24 hour(s))  POCT Glucose every 6 hrs   Collection Time: 11/15/23  5:56 PM  Result Value Ref Range   Glucose, POC 146 (H) 70 - 99 mg/dL   OPERATOR ID [id number]    INSTRUMENT ID [id number]   POCT Glucose every 6 hrs   Collection Time: 11/15/23  9:55 PM  Result Value Ref Range   Glucose, POC 130 (H) 70 - 99 mg/dL   OPERATOR ID [id number]    INSTRUMENT ID [id number]   Basic Metabolic Panel   Collection Time: 11/16/23  4:43 AM  Result Value Ref Range   Na 140 136 - 146 mmol/L   Potassium 3.3 (L) 3.7 - 5.4 mmol/L   Cl 103 97 - 108 mmol/L   CO2 26 20 - 32 mmol/L   AGAP 11 7 - 16 mmol/L   Glucose 179 (H) 65 - 99 mg/dL   BUN 15 8 - 27 mg/dL   Creatinine 9.19 9.42 - 1.00 mg/dL   Ca 8.9 8.6 - 89.7 mg/dL   BUN/CREAT RATIO 81.1 11.0 - 26.0   eGFR 84 >=60 mL/min/1.24m2  Phosphorus   Collection Time: 11/16/23  4:43 AM  Result Value Ref Range   Phos 2.7 2.5 - 4.5 mg/dL  Magnesium   Collection Time: 11/16/23  4:43 AM  Result Value Ref Range   Mg 2.3 1.6 - 2.6 mg/dL  If new NPO order, complete POCT Glucose every 4 hours   Collection Time: 11/16/23  9:10 AM  Result Value Ref Range   Glucose, POC 144 (H) 70 - 99 mg/dL   OPERATOR ID [id number]    INSTRUMENT ID [id number]   CBC   Collection Time: 11/16/23  9:11 AM  Result Value Ref Range   WBC 5.3 4.0 - 10.5 thou/mcL   RBC 4.12 3.93 - 5.22 million/mcL   HGB 13.0 11.2 - 15.7 gm/dL  HCT 38.9 34.1 - 44.9 %   MCV 94.4 79.4 - 94.8 fL   MCH 31.6 25.6 - 32.2 pg   MCHC 33.4 32.2 - 35.5 gm/dL   Plt Ct 842 849 - 599 thou/mcL   RDW SD 44.0 35.1 -  46.3 fL   MPV 10.6 9.4 - 12.4 fL   NRBC% 0.0 0.0 - 0.2 /100WBC   Absolute NRBC Count 0.00 0.00 - 0.01 thou/mcL  If new NPO order, complete POCT Glucose every 4 hours   Collection Time: 11/16/23  1:02 PM  Result Value Ref Range   Glucose, POC 199 (H) 70 - 99 mg/dL   OPERATOR ID [id number]    INSTRUMENT ID [id number]     Last Resulted Components     Date/Time Component Value Flag Units Reference Range Lab Status   11/13/23 2010 CHOL 108 -- mg/dL 899 - 800 mg/dL Final result   87/84/75 2010 HDL 55 -- mg/dL >=60 mg/dL Final result   87/84/75 2010 LDL 39 -- mg/dL 0 - 99 mg/dL Final result   87/84/75 2010 TRIG 72 -- mg/dL 0 - 850 mg/dL Final result   87/83/75 0129 HGBA1C 11.1* High % 4.8 - 5.6 % Final result   11/16/23 0910 GLUCOSE 144* High mg/dL 70 - 99 mg/dL Final result       Images  MRI Head WO Contrast  Result Date: 11/15/2023 INDICATION: Stroke. COMPARISON: Head CT one day prior. TECHNIQUE: Multiplanar, multisequence MR imaging of the brain was obtained without IV contrast. FINDINGS: #  Skull/marrow/soft tissues: Unremarkable. #  Orbits: Bilateral lens extractions. #  Sinuses: Unremarkable. #  Brain: Overall parenchymal volume appropriate for age. Mild scattered foci of T2/FLAIR hyperintensity within the periventricular and subcortical white matter. Old white matter infarcts adjacent to the body of the corpus callosum. Old lacunar infarct right pons. Tiny old infarct right cerebellum. There is no restricted diffusion to suggest an acute or recent infarct. No mass or hemorrhage. Expanded empty sella. No midline shift, hydrocephalus or extra-axial collections. #  Additional comments: None.   IMPRESSION: 1.  No acute intracranial abnormality appreciated. 2.  Mild scattered T2/FLAIR hyperintensities within the cerebral white matter. While nonspecific, favor chronic small vessel ischemic/age-related changes in a patient this age. 3.  Old infarcts within the deep white matter adjacent to the  body of the corpus callosum, right pons and right cerebellum. 4.  Empty sella. Electronically Signed by: Elsie Dayhoff, MD on 11/15/2023 10:32 PM  Echocardiogram Complete WO Enhancing Agent  Result Date: 11/15/2023 .  Left Ventricle: EF: 55-60%. .  Left Ventricle: Wall motion is normal. .  Left Atrium: Injection of agitated saline documents no interatrial shunt. .  Aortic Valve: There is mild stenosis, with peak and mean gradients of 16.000 and 10.000 mmHg. SABRA  Aortic Valve: Moderate aortic valve regurgitation.   CT Head WO Contrast  Result Date: 11/14/2023 INDICATION: Stroke, status post thrombolytic. Ischemic stroke. TECHNIQUE: Axial CT images from skull base to vertex without IV contrast. Radiation dose reduction was utilized (automated exposure control, mA or kV adjustment based on patient size, or iterative image reconstruction). COMPARISON: Outside head CT without contrast and CTA head and neck 11/13/2023. FINDINGS: No significant interval change. Subcentimeter old infarction in the posterior right frontal periventricular white matter/right lateral body of the corpus callosum. Old small lacunar infarction in the right upper pons. Very small old infarction in the mid/upper right cerebellar hemisphere (demonstrated to better advantage on the prior CT). No evidence of an acute cortical-based (large  artery territory) infarction. No acute intracranial hemorrhage. No intracranial mass or mass effect. No hydrocephalus.   IMPRESSION: No acute intracranial abnormality. Old small infarctions. Electronically Signed by: [place] Holder, MD on 11/14/2023 4:27 PM  CT Outside Films  Result Date: 11/13/2023 Patient Name:  Amber Harris Date of Birth:  [birth year]  female Interpretation Procedure was imported into PACS for comparison purposes only.   CT Outside Films  Result Date: 11/13/2023 Patient Name:  Amber Harris Date of Birth:  1961/08/02  female Interpretation Procedure was imported into PACS  for comparison purposes only.    Discharge Instructions  Discharge Teaching Handouts: Stroke  Activity after leaving the hospital: Progressive activity as tolerated  Diet: Diabetic  *Stroke education provided included print out materials addressing the following: - Activation of emergency medical system (call 9-1-1 when a stroke is suspected) - Need for follow up after discharge - Medications prescribed at discharged: informed regarding purpose, correct dose, and major side effects / precautions - Risk factors for stroke - Warning signs and symptoms of stroke  Follow-up appointments: Follow-up with ORPHA YANCEY LABOR, MD in 7-10 days. Follow up in stroke bridge clinic   Follow-up tests: None  --------------------------------------------------------------------------  Time spent in discharge process: > 30 minutes   Electronically signed:  Elston KATHEE Naegeli, MD 11/16/2023 / 3:40 PM  *Some images could not be shown.

## 2023-11-16 NOTE — Progress Notes (Signed)
 [facility] Glycemic Management Team  [facility] Inpatient Diabetes Management Service Progress Note  Date of Admission:  11/13/2023 Length of Stay:  3 Days  CC: I'm doing ok.  Subjective/Events Overnight:  Patient found lying on back in bed with HOB elevated appears comfortable and in no acute distress.  She has been transferred out of ICU and now in 7131.  She was able to recall who I was from past visits.  She is still waiting on her son to bring the insulin  and needed charging cord from home she spoke with him via telephone while was in the room and he endorsed that he would be bring in those supplies today.  Discussed with RN to please notify this provider once supplies have arrived and we would notify the diabetic educator so they could assist with her insulin  pump. She denies any acute complaints at this time.  Glucose reading this a.m. noted to be 144 this a.m.   Diet intake/TPN/tube feeds: Consistent Carbohydrate Percent Meals Eaten (%): 100 %  TDDI: 40 units Steroids: N/a  Objective   Vital signs in last 24 hours: Intake/Output:  Temp:  [97.8 F (36.6 C)-98.9 F (37.2 C)] 98.3 F (36.8 C) Heart Rate:  [51-66] 51 Resp:  [12-25] 16 BP: (108-160)/(52-93) 160/72 SpO2:  [96 %-98 %] 96 %   Wt Readings from Last 1 Encounters:  11/13/23 125.5 kg (276 lb 10.8 oz)    No intake or output data in the 24 hours ending 11/16/23 1031    Physical Exam      Gen: 62 y.o., female , NAD Skin: warm and dry Neuro: alert and oriented Psych:affect normal  Labs:  Recent Results (from the past 24 hour(s))  Echocardiogram Complete WO Enhancing Agent   Collection Time: 11/15/23 10:56 AM  Result Value Ref Range   LA M-L Dimension (A4C) 3.530 cm   LA S-I Dimension (A2C) 4.810 cm   LA Area Sys (A2C) 11.700 cm2   LA Area Sys (A4C) 13.700 cm2   LA ESV (A2C) 28.600 mL   LA ESV (A4C) 31.900 mL   LA A-P Dimension 3.300 cm   Left Atrium Dimension 2D 3.300 cm   LA ESV Index  (A4C) 13.600 ml/m2   LA/Ao Ratio 0.917 no units   LV Length Dias (A4C) 8.310 cm   LV Area Dias (A4C) 32.300 cm2   LVEDV 102.000 ml   LV Diastolic Volume (BP) 93.400 mL   LV Length Sys (A4C) 7.440 cm   LV Systolic Volume (BP) 13.9 mL   LVES V 35.7 ml   IVSd 1.2 0.9 - 1.8 cm   IVS 1.180 cm   LVIDd 4.520 8.34 - 11.59 cm   LVIDD 4.52 cm   LVIDs 2.070 4.80 - 7.27 cm   Left Ventricular Outflow Tract Cross Section Area 20.000 mm   LVOT Diameter 2.000 cm   LVOT Mean Gradient 7.0 mmHg   LVOT Peak VTI 38.000 cm   LVOT Mean Vel 127.000 cm/s   LVOT Peak Velocity 1.590 m/s   LVOT Peak Gradient 10.000 mmHg   LVPWd 1.150 0.95 - 1.78 cm   LVPWD 1.150 cm   LV Peak Diastolic Tissue Velocity During (A Sys Lat) 10.100 cm/s   MV E' Lateral Tissue Vel 10.100 cm/s   LV Peak Diastolic Tissue Velocity During (A Sys Med) 7.940 cm/s   MV E' Tissue Velocity Lateral 7.940 cm/s   A4C EF 65 %   Left Ventricular EF by 2-D Biplane by Method of  Disks 90.400 %   Left Ventricular EF by Teichholz Method 85.100 %   E/E' Lateral Ratio 8.400 no units   E/E' Ratio 10.70 no units   LVFS 54.200 %   Interventricular Septum/Left Ventricular PWD by 2D 1.030 no units   LVOT Area 3.14 cm2   LV Stroke Volume 66.30 mL   LVOT stroke volume 119.000 cm3   AR PHT 467.000 ms   AoV Regurgitant PHT 467.000 ms   Aortic Regurgitation Peak Velocity 382.000 cm/s   AV VMAX 1.980 m/s   AoV Peak Velocity 1.980 m/s   AoV Peak Gradient 16.000 mmHg   Aortic valve mean velocity 1.520 m/s   AoV Mean Gradient 10.000 mmHg   Aortic valve velocity time integral 0.475 m   Aortic valve mean velocity 1.520 m/s   AVA (VTI) 2.510 cm2   AoV Valve Area 2.52 cm2   AVA/BSA 1.070 no units   AV index (prosthetic) 0.800 no units   Aortic root 3.600 cm   Ao root annulus 3.600 cm   IVC 1.85 cm   MV DT 298.000 ms   E wave deceleration time 298.000 msec   MV PHT 99.000 ms   MV A Velocity 96.400 cm/s   MV Peak A Vel 0.964 m/s   MV E Velocity  84.900 cm/s   MV Peak E Vel 84.900 cm/s   Mitral valve mean inflow velocity 0.677 m/s   MV mean gradient 2.000 mmHg   MV VTI 33.700 cm   Mitral valve mean inflow velocity 0.677 m/s   MV Mean Gradient 2.000 mmHg   MV Comp VTI 33.700 cm   MV Vmax 107.000 cm/s   MV Vmax 107.000 cm/s   MV Peak Gradient 5.000 mmHg   MVA (PHT) 2.370 cm2   MV valve area by continuity eq 3.54 cm2   MV E/A 0.900 no units   RA Pressure 3.000 mmHg   TAPSE 2.200 cm   LV Mass 191.000 g   Patient Height 68    Patient Weight 4,426.84    Blood Pressure Monitoring 123/60    Pulse 64    Resp Care Set Rate Venous 17    Calculated BMI 42.1    BSA 2.45 m2   Pulse Ox 97    ZLVPWD -0.63    ZLVIDS -8.29    ZLVIDD -7.77    ZIVSD -0.26   If new NPO order, complete POCT Glucose every 4 hours   Collection Time: 11/15/23  1:00 PM  Result Value Ref Range   Glucose, POC 172 (H) 70 - 99 mg/dL   OPERATOR ID [id number]    INSTRUMENT ID [id number]   POCT Glucose every 6 hrs   Collection Time: 11/15/23  5:56 PM  Result Value Ref Range   Glucose, POC 146 (H) 70 - 99 mg/dL   OPERATOR ID [id number]    INSTRUMENT ID [id number]   POCT Glucose every 6 hrs   Collection Time: 11/15/23  9:55 PM  Result Value Ref Range   Glucose, POC 130 (H) 70 - 99 mg/dL   OPERATOR ID [id number]    INSTRUMENT ID [id number]   Basic Metabolic Panel   Collection Time: 11/16/23  4:43 AM  Result Value Ref Range   Na 140 136 - 146 mmol/L   Potassium 3.3 (L) 3.7 - 5.4 mmol/L   Cl 103 97 - 108 mmol/L   CO2 26 20 - 32 mmol/L   AGAP 11 7 - 16 mmol/L   Glucose 179 (H) 65 -  99 mg/dL   BUN 15 8 - 27 mg/dL   Creatinine 9.19 9.42 - 1.00 mg/dL   Ca 8.9 8.6 - 89.7 mg/dL   BUN/CREAT RATIO 81.1 11.0 - 26.0   eGFR 84 >=60 mL/min/1.13m2  Phosphorus   Collection Time: 11/16/23  4:43 AM  Result Value Ref Range   Phos 2.7 2.5 - 4.5 mg/dL  Magnesium   Collection Time: 11/16/23  4:43 AM  Result Value Ref Range   Mg 2.3 1.6 - 2.6 mg/dL  If new NPO order,  complete POCT Glucose every 4 hours   Collection Time: 11/16/23  9:10 AM  Result Value Ref Range   Glucose, POC 144 (H) 70 - 99 mg/dL   OPERATOR ID [id number]    INSTRUMENT ID [id number]   CBC   Collection Time: 11/16/23  9:11 AM  Result Value Ref Range   WBC 5.3 4.0 - 10.5 thou/mcL   RBC 4.12 3.93 - 5.22 million/mcL   HGB 13.0 11.2 - 15.7 gm/dL   HCT 61.0 65.8 - 55.0 %   MCV 94.4 79.4 - 94.8 fL   MCH 31.6 25.6 - 32.2 pg   MCHC 33.4 32.2 - 35.5 gm/dL   Plt Ct 842 849 - 599 thou/mcL   RDW SD 44.0 35.1 - 46.3 fL   MPV 10.6 9.4 - 12.4 fL   NRBC% 0.0 0.0 - 0.2 /100WBC   Absolute NRBC Count 0.00 0.00 - 0.01 thou/mcL   Lab Results  Component Value Date   Hemoglobin A1c 11.1 (H) 11/14/2023   No results found for this or any previous visit (from the past 72 hour(s)).  Recent Labs    Units 11/16/23 0910 11/16/23 0443 11/15/23 2155 11/15/23 1756 11/15/23 1300 11/15/23 0854  GLUCOSE mg/dL 855* 820* 869* 853* 827* 133*    Hospital Medications: . aspirin   81 mg Oral Daily  . magnesium hydroxide  30 mL Oral ONCE   Followed by  . bisacodyl  10 mg Rectal ONCE   Followed by  . magnesium citrate  150 mL Oral ONCE  . budesonide-formoterol  2 puff Inhalation BID RSP  . citalopram hydrobromide  20 mg Oral Daily  . docusate sodium   100 mg Oral BID  . ezetimibe   10 mg Oral Daily  . gabapentin   300 mg Oral TID  . heparin   5,000 Units Subcutaneous Q8H 05-12-21  . insulin  glargine  15 Units Subcutaneous HS  . insulin  lispro  1-6 Units Subcutaneous AC&HS  . insulin  lispro  8 Units Subcutaneous Meals  . oxybutynin  5 mg Oral Daily  . polyethylene glycol  17 g Oral Daily  . risperiDONE   0.5 mg Oral HS  . rosuvastatin  calcium   40 mg Oral Daily    Home Glycemic Medications: Omnipod - Humalog insulin     Assessment and Plan:   # Insulin -dependent type 2 diabetes-uncontrolled; HgbA1c: 11.1 # Hyperglycemia   -Insulin  pump removed the evening of 12/15 as per orders by admitting  provider -Glucose reading this a.m. 144 -Continue Humalog sliding scale # 2 AC/HS  - With continued hyperglycemia on afternoon of 12/16 we initiated nutritional dosing of Humalog 8 units with meals; no changes at this time as glucose readings appropriate this a.m. - Her son is supposed to bring her insulin  and needed charging cord for Omipod PDM; have asked RN to please notify this provider when the supplies are available and we would notify the diabetic educator as well -Continue Lantus  15 units nightly which is a 30% reduction from  her previous dosing of Tresiba  21 units nightly; no changes to dosing today -Diabetic educator consult placed for insulin  pump and Hgb A1c greater than 9; spoke with diabetic educator on 12/16 and advised they would see patient when she is moved out of the ICU.  Appreciate their assistance.  If all needed supplies for insulin  pump are brought to bedside today diabetic educator will meet with patient today. -We will continue to follow along and make further recommendations/adjustments as needed based off glucose trends.     # Acute right stroke: -Per primary team   # Obesity BMI 42.07 kg/M -Affects all aspects of care    General  Ethics: Full Code  Surrogate decision maker Son  PCP: ORPHA YANCEY LABOR, MD [phone]  Outpatient Endocrinologist: N/A       Disposition/ Discharge Plan Per primary team   Discussed plan of care with RN, Pt, diabetic educator.     The assessment and plan has been formulated with Dr. Murphy Lyle FORBES Kai, NP 11/16/2023 10:31 AM

## 2023-11-17 ENCOUNTER — Encounter (HOSPITAL_BASED_OUTPATIENT_CLINIC_OR_DEPARTMENT_OTHER): Payer: 59 | Admitting: Pulmonary Disease

## 2023-11-18 NOTE — Telephone Encounter (Signed)
Pt was recently hospitalized   I have Amber Harris rescheduling her sleep study at the sleep lab and will call pt will info

## 2023-11-18 NOTE — Telephone Encounter (Signed)
Thank you :)

## 2023-11-19 DIAGNOSIS — Z7984 Long term (current) use of oral hypoglycemic drugs: Secondary | ICD-10-CM | POA: Diagnosis not present

## 2023-11-19 DIAGNOSIS — Z794 Long term (current) use of insulin: Secondary | ICD-10-CM | POA: Diagnosis not present

## 2023-11-19 DIAGNOSIS — I69354 Hemiplegia and hemiparesis following cerebral infarction affecting left non-dominant side: Secondary | ICD-10-CM | POA: Diagnosis not present

## 2023-11-19 DIAGNOSIS — I10 Essential (primary) hypertension: Secondary | ICD-10-CM | POA: Diagnosis not present

## 2023-11-19 DIAGNOSIS — I252 Old myocardial infarction: Secondary | ICD-10-CM | POA: Diagnosis not present

## 2023-11-19 DIAGNOSIS — E119 Type 2 diabetes mellitus without complications: Secondary | ICD-10-CM | POA: Diagnosis not present

## 2023-11-19 DIAGNOSIS — R519 Headache, unspecified: Secondary | ICD-10-CM | POA: Diagnosis not present

## 2023-11-19 DIAGNOSIS — I69322 Dysarthria following cerebral infarction: Secondary | ICD-10-CM | POA: Diagnosis not present

## 2023-11-19 DIAGNOSIS — J449 Chronic obstructive pulmonary disease, unspecified: Secondary | ICD-10-CM | POA: Diagnosis not present

## 2023-11-20 DIAGNOSIS — E119 Type 2 diabetes mellitus without complications: Secondary | ICD-10-CM | POA: Diagnosis not present

## 2023-11-20 DIAGNOSIS — Z7984 Long term (current) use of oral hypoglycemic drugs: Secondary | ICD-10-CM | POA: Diagnosis not present

## 2023-11-20 DIAGNOSIS — I69322 Dysarthria following cerebral infarction: Secondary | ICD-10-CM | POA: Diagnosis not present

## 2023-11-20 DIAGNOSIS — I252 Old myocardial infarction: Secondary | ICD-10-CM | POA: Diagnosis not present

## 2023-11-20 DIAGNOSIS — R519 Headache, unspecified: Secondary | ICD-10-CM | POA: Diagnosis not present

## 2023-11-20 DIAGNOSIS — Z794 Long term (current) use of insulin: Secondary | ICD-10-CM | POA: Diagnosis not present

## 2023-11-20 DIAGNOSIS — I69354 Hemiplegia and hemiparesis following cerebral infarction affecting left non-dominant side: Secondary | ICD-10-CM | POA: Diagnosis not present

## 2023-11-20 DIAGNOSIS — J449 Chronic obstructive pulmonary disease, unspecified: Secondary | ICD-10-CM | POA: Diagnosis not present

## 2023-11-20 DIAGNOSIS — I10 Essential (primary) hypertension: Secondary | ICD-10-CM | POA: Diagnosis not present

## 2023-11-22 ENCOUNTER — Other Ambulatory Visit: Payer: Self-pay | Admitting: Cardiology

## 2023-11-28 DIAGNOSIS — Z794 Long term (current) use of insulin: Secondary | ICD-10-CM | POA: Diagnosis not present

## 2023-11-28 DIAGNOSIS — I69322 Dysarthria following cerebral infarction: Secondary | ICD-10-CM | POA: Diagnosis not present

## 2023-11-28 DIAGNOSIS — I252 Old myocardial infarction: Secondary | ICD-10-CM | POA: Diagnosis not present

## 2023-11-28 DIAGNOSIS — Z7984 Long term (current) use of oral hypoglycemic drugs: Secondary | ICD-10-CM | POA: Diagnosis not present

## 2023-11-28 DIAGNOSIS — I69354 Hemiplegia and hemiparesis following cerebral infarction affecting left non-dominant side: Secondary | ICD-10-CM | POA: Diagnosis not present

## 2023-11-28 DIAGNOSIS — J449 Chronic obstructive pulmonary disease, unspecified: Secondary | ICD-10-CM | POA: Diagnosis not present

## 2023-11-28 DIAGNOSIS — R519 Headache, unspecified: Secondary | ICD-10-CM | POA: Diagnosis not present

## 2023-11-28 DIAGNOSIS — E119 Type 2 diabetes mellitus without complications: Secondary | ICD-10-CM | POA: Diagnosis not present

## 2023-11-28 DIAGNOSIS — I10 Essential (primary) hypertension: Secondary | ICD-10-CM | POA: Diagnosis not present

## 2023-11-29 DIAGNOSIS — Z8673 Personal history of transient ischemic attack (TIA), and cerebral infarction without residual deficits: Secondary | ICD-10-CM | POA: Diagnosis not present

## 2023-11-29 DIAGNOSIS — E7849 Other hyperlipidemia: Secondary | ICD-10-CM | POA: Diagnosis not present

## 2023-11-29 DIAGNOSIS — E1122 Type 2 diabetes mellitus with diabetic chronic kidney disease: Secondary | ICD-10-CM | POA: Diagnosis not present

## 2023-11-29 DIAGNOSIS — N182 Chronic kidney disease, stage 2 (mild): Secondary | ICD-10-CM | POA: Diagnosis not present

## 2023-11-29 DIAGNOSIS — M5459 Other low back pain: Secondary | ICD-10-CM | POA: Diagnosis not present

## 2023-11-29 DIAGNOSIS — I10 Essential (primary) hypertension: Secondary | ICD-10-CM | POA: Diagnosis not present

## 2023-12-03 DIAGNOSIS — R519 Headache, unspecified: Secondary | ICD-10-CM | POA: Diagnosis not present

## 2023-12-03 DIAGNOSIS — J449 Chronic obstructive pulmonary disease, unspecified: Secondary | ICD-10-CM | POA: Diagnosis not present

## 2023-12-03 DIAGNOSIS — I69322 Dysarthria following cerebral infarction: Secondary | ICD-10-CM | POA: Diagnosis not present

## 2023-12-03 DIAGNOSIS — I10 Essential (primary) hypertension: Secondary | ICD-10-CM | POA: Diagnosis not present

## 2023-12-03 DIAGNOSIS — I69354 Hemiplegia and hemiparesis following cerebral infarction affecting left non-dominant side: Secondary | ICD-10-CM | POA: Diagnosis not present

## 2023-12-03 DIAGNOSIS — Z794 Long term (current) use of insulin: Secondary | ICD-10-CM | POA: Diagnosis not present

## 2023-12-03 DIAGNOSIS — Z7984 Long term (current) use of oral hypoglycemic drugs: Secondary | ICD-10-CM | POA: Diagnosis not present

## 2023-12-03 DIAGNOSIS — I252 Old myocardial infarction: Secondary | ICD-10-CM | POA: Diagnosis not present

## 2023-12-03 DIAGNOSIS — E119 Type 2 diabetes mellitus without complications: Secondary | ICD-10-CM | POA: Diagnosis not present

## 2023-12-06 DIAGNOSIS — Z794 Long term (current) use of insulin: Secondary | ICD-10-CM | POA: Diagnosis not present

## 2023-12-06 DIAGNOSIS — I69322 Dysarthria following cerebral infarction: Secondary | ICD-10-CM | POA: Diagnosis not present

## 2023-12-06 DIAGNOSIS — Z7984 Long term (current) use of oral hypoglycemic drugs: Secondary | ICD-10-CM | POA: Diagnosis not present

## 2023-12-06 DIAGNOSIS — I10 Essential (primary) hypertension: Secondary | ICD-10-CM | POA: Diagnosis not present

## 2023-12-06 DIAGNOSIS — I69354 Hemiplegia and hemiparesis following cerebral infarction affecting left non-dominant side: Secondary | ICD-10-CM | POA: Diagnosis not present

## 2023-12-06 DIAGNOSIS — R519 Headache, unspecified: Secondary | ICD-10-CM | POA: Diagnosis not present

## 2023-12-06 DIAGNOSIS — J449 Chronic obstructive pulmonary disease, unspecified: Secondary | ICD-10-CM | POA: Diagnosis not present

## 2023-12-06 DIAGNOSIS — E119 Type 2 diabetes mellitus without complications: Secondary | ICD-10-CM | POA: Diagnosis not present

## 2023-12-06 DIAGNOSIS — I252 Old myocardial infarction: Secondary | ICD-10-CM | POA: Diagnosis not present

## 2023-12-16 DIAGNOSIS — I252 Old myocardial infarction: Secondary | ICD-10-CM | POA: Diagnosis not present

## 2023-12-16 DIAGNOSIS — E119 Type 2 diabetes mellitus without complications: Secondary | ICD-10-CM | POA: Diagnosis not present

## 2023-12-16 DIAGNOSIS — R519 Headache, unspecified: Secondary | ICD-10-CM | POA: Diagnosis not present

## 2023-12-16 DIAGNOSIS — Z7984 Long term (current) use of oral hypoglycemic drugs: Secondary | ICD-10-CM | POA: Diagnosis not present

## 2023-12-16 DIAGNOSIS — J449 Chronic obstructive pulmonary disease, unspecified: Secondary | ICD-10-CM | POA: Diagnosis not present

## 2023-12-16 DIAGNOSIS — Z794 Long term (current) use of insulin: Secondary | ICD-10-CM | POA: Diagnosis not present

## 2023-12-16 DIAGNOSIS — I69322 Dysarthria following cerebral infarction: Secondary | ICD-10-CM | POA: Diagnosis not present

## 2023-12-16 DIAGNOSIS — I69354 Hemiplegia and hemiparesis following cerebral infarction affecting left non-dominant side: Secondary | ICD-10-CM | POA: Diagnosis not present

## 2023-12-16 DIAGNOSIS — I10 Essential (primary) hypertension: Secondary | ICD-10-CM | POA: Diagnosis not present

## 2023-12-23 ENCOUNTER — Other Ambulatory Visit: Payer: Self-pay | Admitting: Cardiology

## 2023-12-23 DIAGNOSIS — Z7982 Long term (current) use of aspirin: Secondary | ICD-10-CM | POA: Diagnosis not present

## 2023-12-23 DIAGNOSIS — E119 Type 2 diabetes mellitus without complications: Secondary | ICD-10-CM | POA: Diagnosis not present

## 2023-12-23 DIAGNOSIS — Z7984 Long term (current) use of oral hypoglycemic drugs: Secondary | ICD-10-CM | POA: Diagnosis not present

## 2023-12-23 DIAGNOSIS — J449 Chronic obstructive pulmonary disease, unspecified: Secondary | ICD-10-CM | POA: Diagnosis not present

## 2023-12-23 DIAGNOSIS — I10 Essential (primary) hypertension: Secondary | ICD-10-CM | POA: Diagnosis not present

## 2023-12-23 DIAGNOSIS — R0789 Other chest pain: Secondary | ICD-10-CM | POA: Diagnosis not present

## 2023-12-23 DIAGNOSIS — Z886 Allergy status to analgesic agent status: Secondary | ICD-10-CM | POA: Diagnosis not present

## 2023-12-23 DIAGNOSIS — Z87891 Personal history of nicotine dependence: Secondary | ICD-10-CM | POA: Diagnosis not present

## 2023-12-23 DIAGNOSIS — Z743 Need for continuous supervision: Secondary | ICD-10-CM | POA: Diagnosis not present

## 2023-12-23 DIAGNOSIS — R079 Chest pain, unspecified: Secondary | ICD-10-CM | POA: Diagnosis not present

## 2023-12-23 DIAGNOSIS — Z79899 Other long term (current) drug therapy: Secondary | ICD-10-CM | POA: Diagnosis not present

## 2023-12-23 DIAGNOSIS — Z880 Allergy status to penicillin: Secondary | ICD-10-CM | POA: Diagnosis not present

## 2023-12-27 DIAGNOSIS — R519 Headache, unspecified: Secondary | ICD-10-CM | POA: Diagnosis not present

## 2023-12-27 DIAGNOSIS — J449 Chronic obstructive pulmonary disease, unspecified: Secondary | ICD-10-CM | POA: Diagnosis not present

## 2023-12-27 DIAGNOSIS — Z8673 Personal history of transient ischemic attack (TIA), and cerebral infarction without residual deficits: Secondary | ICD-10-CM | POA: Diagnosis not present

## 2023-12-27 DIAGNOSIS — Z87891 Personal history of nicotine dependence: Secondary | ICD-10-CM | POA: Diagnosis not present

## 2023-12-27 DIAGNOSIS — Z7984 Long term (current) use of oral hypoglycemic drugs: Secondary | ICD-10-CM | POA: Diagnosis not present

## 2023-12-27 DIAGNOSIS — Z9189 Other specified personal risk factors, not elsewhere classified: Secondary | ICD-10-CM | POA: Diagnosis not present

## 2023-12-27 DIAGNOSIS — E119 Type 2 diabetes mellitus without complications: Secondary | ICD-10-CM | POA: Diagnosis not present

## 2023-12-27 DIAGNOSIS — I69354 Hemiplegia and hemiparesis following cerebral infarction affecting left non-dominant side: Secondary | ICD-10-CM | POA: Diagnosis not present

## 2023-12-27 DIAGNOSIS — I10 Essential (primary) hypertension: Secondary | ICD-10-CM | POA: Diagnosis not present

## 2023-12-27 DIAGNOSIS — I252 Old myocardial infarction: Secondary | ICD-10-CM | POA: Diagnosis not present

## 2023-12-27 DIAGNOSIS — E1169 Type 2 diabetes mellitus with other specified complication: Secondary | ICD-10-CM | POA: Diagnosis not present

## 2023-12-27 DIAGNOSIS — E785 Hyperlipidemia, unspecified: Secondary | ICD-10-CM | POA: Diagnosis not present

## 2023-12-27 DIAGNOSIS — I69322 Dysarthria following cerebral infarction: Secondary | ICD-10-CM | POA: Diagnosis not present

## 2023-12-27 DIAGNOSIS — Z794 Long term (current) use of insulin: Secondary | ICD-10-CM | POA: Diagnosis not present

## 2023-12-28 ENCOUNTER — Encounter (INDEPENDENT_AMBULATORY_CARE_PROVIDER_SITE_OTHER): Payer: Self-pay | Admitting: *Deleted

## 2024-01-10 ENCOUNTER — Encounter (HOSPITAL_BASED_OUTPATIENT_CLINIC_OR_DEPARTMENT_OTHER): Payer: 59 | Admitting: Pulmonary Disease

## 2024-01-16 ENCOUNTER — Telehealth (INDEPENDENT_AMBULATORY_CARE_PROVIDER_SITE_OTHER): Payer: Self-pay | Admitting: Gastroenterology

## 2024-01-16 NOTE — Telephone Encounter (Signed)
Attempted to contact pt. Pt voicemail is full at this time

## 2024-01-16 NOTE — Telephone Encounter (Signed)
Ok to schedule.  Room 3  Thanks,  Vista Lawman, MD Gastroenterology and Hepatology [facility]

## 2024-01-16 NOTE — Telephone Encounter (Signed)
Who is your primary care physician: Dr.Hasanaj  Reasons for the colonoscopy:   Have you had a colonoscopy before?  Yes 10 years ago  Do you have family history of colon cancer? no  Previous colonoscopy with polyps removed? Yes 10 years ago  Do you have a history colorectal cancer?   no  Are you diabetic? If yes, Type 1 or Type 2?    Yes type 2  Do you have a prosthetic or mechanical heart valve? no  Do you have a pacemaker/defibrillator?   no  Have you had endocarditis/atrial fibrillation? no  Have you had joint replacement within the last 12 months?  no  Do you tend to be constipated or have to use laxatives? yes  Do you have any history of drugs or alchohol?  no  Do you use supplemental oxygen?  no  Have you had a stroke or heart attack within the last 6 months? yes  Do you take weight loss medication?  no  For female patients: have you had a hysterectomy?  yes                                     are you post menopausal?       no                                            do you still have your menstrual cycle? no      Do you take any blood-thinning medications such as: (aspirin, warfarin, Plavix, Aggrenox)  yes   If yes we need the name, milligram, dosage and who is prescribing doctor Aspirin/Plavis Current Outpatient Medications on File Prior to Visit  Medication Sig Dispense Refill   aspirin EC 81 MG tablet Take 81 mg by mouth daily.     chlorthalidone (HYGROTON) 50 MG tablet Take 50 mg by mouth daily.     citalopram (CELEXA) 20 MG tablet Take 20 mg by mouth every morning.     clopidogrel (PLAVIX) 75 MG tablet Take 75 mg by mouth every morning.     ezetimibe (ZETIA) 10 MG tablet TAKE 1 TABLET BY MOUTH ONCE DAILY 30 tablet 10   gabapentin (NEURONTIN) 300 MG capsule Take 300 mg by mouth 3 (three) times daily.     hydrALAZINE (APRESOLINE) 50 MG tablet TAKE ONE (1) TABLET BY MOUTH TWICE DAILY 60 tablet 4   JARDIANCE 25 MG TABS tablet Take 25 mg by mouth daily.       losartan (COZAAR) 100 MG tablet Take 100 mg by mouth daily.     methocarbamol (ROBAXIN) 500 MG tablet Take 500 mg by mouth at bedtime.     omeprazole (PRILOSEC) 20 MG capsule Take 20 mg by mouth daily.     oxybutynin (DITROPAN-XL) 5 MG 24 hr tablet Take 5 mg by mouth daily.     risperiDONE (RISPERDAL) 0.5 MG tablet Take 0.5 mg by mouth at bedtime.     rosuvastatin (CRESTOR) 40 MG tablet TAKE 1 TABLET BY MOUTH EVERY DAY AT BEDTIME 90 tablet 2   albuterol (PROVENTIL,VENTOLIN) 90 MCG/ACT inhaler Inhale 2 puffs into the lungs every 6 (six) hours as needed for wheezing or shortness of breath.  (Patient not taking: Reported on 01/16/2024)     Fluticasone-Umeclidin-Vilant (TRELEGY ELLIPTA) 100-62.5-25 MCG/INH AEPB Inhale  1 puff into the lungs daily. (Patient not taking: Reported on 01/16/2024)     furosemide (LASIX) 20 MG tablet TAKE 1 TABLET BY MOUTH DAILY AS NEEDED FOR SWELLING (Patient not taking: Reported on 01/16/2024) 30 tablet 10   ibuprofen (ADVIL) 800 MG tablet Take 800 mg by mouth every 8 (eight) hours as needed. (Patient not taking: Reported on 01/16/2024)     insulin aspart (NOVOLOG) 100 UNIT/ML injection Inject 8 Units into the skin 3 (three) times daily after meals. (Patient not taking: Reported on 01/16/2024)     insulin degludec (TRESIBA FLEXTOUCH) 100 UNIT/ML FlexTouch Pen Inject 30 Units into the skin at bedtime. (Patient not taking: Reported on 01/16/2024)     Omega-3 Fatty Acids (FISH OIL) 1000 MG CAPS Take 1 capsule by mouth 3 (three) times daily. (Patient not taking: Reported on 01/16/2024)     ondansetron (ZOFRAN) 4 MG tablet Take 4 mg by mouth every 8 (eight) hours as needed. (Patient not taking: Reported on 01/16/2024)     risperiDONE (RISPERDAL) 0.5 MG tablet Take 0.5 mg by mouth at bedtime. (Patient not taking: Reported on 01/16/2024)     topiramate (TOPAMAX) 50 MG tablet Take 50 mg by mouth 2 (two) times daily as needed (headaches). (Patient not taking: Reported on 01/16/2024)     No  current facility-administered medications on file prior to visit.    Allergies  Allergen Reactions   Aspirin Itching    States that she takes aspirin regularly without significant reaction.   Penicillins Rash     Pharmacy: [facility]  Primary Insurance Name: [facility]  Best number where you can be reached: [phone]

## 2024-01-17 ENCOUNTER — Telehealth (INDEPENDENT_AMBULATORY_CARE_PROVIDER_SITE_OTHER): Payer: Self-pay | Admitting: Gastroenterology

## 2024-01-17 ENCOUNTER — Encounter (INDEPENDENT_AMBULATORY_CARE_PROVIDER_SITE_OTHER): Payer: Self-pay | Admitting: *Deleted

## 2024-01-17 MED ORDER — PEG 3350-KCL-NA BICARB-NACL 420 G PO SOLR
4000.0000 mL | Freq: Once | ORAL | 0 refills | Status: DC
Start: 2024-01-17 — End: 2024-01-26

## 2024-01-17 NOTE — Telephone Encounter (Signed)
Pt returned call. Pt scheduled. Instructions will be mailed once pre op has been received. No PA needed per insurance.

## 2024-01-17 NOTE — Telephone Encounter (Signed)
    01/17/24  Amber Harris February 15, 1961  What type of surgery is being performed? Colonoscopy  When is surgery scheduled? 02/14/24  Clearance to hold Plavix for 5 days prior  Name of physician performing surgery?  Dr. Starleen Arms [facility]enter[facility] at [facility] Phone: [phone] Fax: [phone]  Anethesia type (none, local, MAC, general)? MAC

## 2024-01-17 NOTE — Telephone Encounter (Signed)
 Left message to return call

## 2024-01-17 NOTE — Addendum Note (Signed)
Addended by: Marlowe Shores on: 01/17/2024 02:03 PM   Modules accepted: Orders

## 2024-01-18 NOTE — Telephone Encounter (Signed)
 Referral completed, [facility] apt letter sent to PCP

## 2024-01-23 NOTE — Telephone Encounter (Signed)
 Fax received from provider stating "yes ok to hold Plavix 5 days prior"

## 2024-01-25 ENCOUNTER — Other Ambulatory Visit (INDEPENDENT_AMBULATORY_CARE_PROVIDER_SITE_OTHER): Payer: Self-pay | Admitting: Gastroenterology

## 2024-01-26 ENCOUNTER — Ambulatory Visit: Payer: 59 | Attending: Cardiology | Admitting: Cardiology

## 2024-01-26 ENCOUNTER — Encounter: Payer: Self-pay | Admitting: Cardiology

## 2024-01-26 VITALS — BP 122/68 | HR 75 | Ht 68.0 in | Wt 278.6 lb

## 2024-01-26 DIAGNOSIS — I5032 Chronic diastolic (congestive) heart failure: Secondary | ICD-10-CM

## 2024-01-26 DIAGNOSIS — E782 Mixed hyperlipidemia: Secondary | ICD-10-CM | POA: Diagnosis not present

## 2024-01-26 DIAGNOSIS — I1A0 Resistant hypertension: Secondary | ICD-10-CM | POA: Diagnosis not present

## 2024-01-26 NOTE — Progress Notes (Signed)
 Clinical Summary Ms. Auerbach is a 63 y.o.female seen today for follow up of the following medical problems.      1. HTN    01/2023 renal artery [place]: no significant stenosis 2013 sleep study: no OSA. - normal aldo levels - loud snoring, no apneic episodes, +daytime somnolence.   - referred to pulm last visit due to signs and symptoms of OSA -  home bp's 130s/60s - compliant withmeds    2. Hyperlipidemia   - she is on crestor 40mg  daily.  - 10/2022 TC 166 TG 86 HDL 64 LDL 86 05/2023 TC 89 TG 45 LDL 22 HDL 58 - 10/2023 TC 113 TG 101 HDL 54 LDL 40   4. Chronic diastolic HF  -occasional LE edema, overall controlled - mild edema, rare infrequent - no SOb/DOE   5. History of CVA - admitted [facility] about 1 year ago - in hospital for about 1 week per her report - residual left sided weakness   04/2020 CT head [facility] old lacunar infarct   - suspecte TIA during 01/2021 admission  - admit 05/2023 with CVA [facility]. Imaging was benign  - plavix was added at that time.      6. Chest pain -01/2021 admission with chest pain - 01/2021 cath without significant disease - still with chest pains at times - sharp pain at times midchest, worst with deep breathing. Lasted about 4.5 hours. Can awake from sleep.  - no recent symptoms  - admit 10/2023 with recurrent CVA     6. Carotid stenosis - carotid US 2023 RICA 1-39%, LICA no stenosis   7. Aortic regurgitation - 05/2023 echo [facility]: LVEF >55%, mild to mod AI - 10/2023 LVEF 55-60%, no WMAs, mod AI  Past Medical History:  Diagnosis Date   Aortic valve disease    Long-standing systolic murmur   Asthma    Uses p.r.n. albuterol   Bronchitis    COPD (chronic obstructive pulmonary disease) (HCC)    Degenerative joint disease    right knee   Depression    Depression with anxiety    Diabetes mellitus    Dyspnea    exertion   Dyspnea on exertion    poor exercise tolerance   Fibroids    uterine; postmenopausal bleeding   GERD  (gastroesophageal reflux disease)    Headache(784.0)    Hyperlipidemia    Hypertension    Myocardial infarction (HCC)    Obesity    Palpitations    Stroke (HCC)    TIA - left side residual   Syncope    exertional   Tobacco user    30 pack years; 04/2011: 1/4 pack per day during quick attempt     Allergies  Allergen Reactions   Aspirin Itching    States that she takes aspirin regularly without significant reaction.   Penicillins Rash     Current Outpatient Medications  Medication Sig Dispense Refill   albuterol (PROVENTIL,VENTOLIN) 90 MCG/ACT inhaler Inhale 2 puffs into the lungs every 6 (six) hours as needed for wheezing or shortness of breath.  (Patient not taking: Reported on 01/16/2024)     aspirin EC 81 MG tablet Take 81 mg by mouth daily.     chlorthalidone (HYGROTON) 50 MG tablet Take 50 mg by mouth daily.     citalopram (CELEXA) 20 MG tablet Take 20 mg by mouth every morning.     clopidogrel (PLAVIX) 75 MG tablet Take 75 mg by mouth every  morning.     ezetimibe (ZETIA) 10 MG tablet TAKE 1 TABLET BY MOUTH ONCE DAILY 30 tablet 10   Fluticasone-Umeclidin-Vilant (TRELEGY ELLIPTA) 100-62.5-25 MCG/INH AEPB Inhale 1 puff into the lungs daily. (Patient not taking: Reported on 01/16/2024)     furosemide (LASIX) 20 MG tablet TAKE 1 TABLET BY MOUTH DAILY AS NEEDED FOR SWELLING (Patient not taking: Reported on 01/16/2024) 30 tablet 10   gabapentin (NEURONTIN) 300 MG capsule Take 300 mg by mouth 3 (three) times daily.     hydrALAZINE (APRESOLINE) 50 MG tablet TAKE ONE (1) TABLET BY MOUTH TWICE DAILY 60 tablet 4   ibuprofen (ADVIL) 800 MG tablet Take 800 mg by mouth every 8 (eight) hours as needed. (Patient not taking: Reported on 01/16/2024)     insulin aspart (NOVOLOG) 100 UNIT/ML injection Inject 8 Units into the skin 3 (three) times daily after meals. (Patient not taking: Reported on 01/16/2024)     insulin degludec (TRESIBA FLEXTOUCH) 100 UNIT/ML FlexTouch Pen Inject 30 Units into the  skin at bedtime. (Patient not taking: Reported on 01/16/2024)     JARDIANCE 25 MG TABS tablet Take 25 mg by mouth daily.      losartan (COZAAR) 100 MG tablet Take 100 mg by mouth daily.     methocarbamol (ROBAXIN) 500 MG tablet Take 500 mg by mouth at bedtime.     Omega-3 Fatty Acids (FISH OIL) 1000 MG CAPS Take 1 capsule by mouth 3 (three) times daily. (Patient not taking: Reported on 01/16/2024)     omeprazole (PRILOSEC) 20 MG capsule Take 20 mg by mouth daily.     ondansetron (ZOFRAN) 4 MG tablet Take 4 mg by mouth every 8 (eight) hours as needed. (Patient not taking: Reported on 01/16/2024)     oxybutynin (DITROPAN-XL) 5 MG 24 hr tablet Take 5 mg by mouth daily.     polyethylene glycol-electrolytes (NULYTELY) 420 g solution TAKE 4,000 MLS BY MOUTH ONCE FOR 1 DOSE. 4000 mL 10   risperiDONE (RISPERDAL) 0.5 MG tablet Take 0.5 mg by mouth at bedtime. (Patient not taking: Reported on 01/16/2024)     risperiDONE (RISPERDAL) 0.5 MG tablet Take 0.5 mg by mouth at bedtime.     rosuvastatin (CRESTOR) 40 MG tablet TAKE 1 TABLET BY MOUTH EVERY DAY AT BEDTIME 90 tablet 2   topiramate (TOPAMAX) 50 MG tablet Take 50 mg by mouth 2 (two) times daily as needed (headaches). (Patient not taking: Reported on 01/16/2024)     No current facility-administered medications for this visit.     Past Surgical History:  Procedure Laterality Date   ABDOMINAL HYSTERECTOMY  12/21/2011   Procedure: HYSTERECTOMY ABDOMINAL;  Surgeon: Tilda Burrow, MD;  Location: [facility];  Service: Gynecology;  Laterality: N/A;   CATARACT EXTRACTION W/PHACO Left 10/03/2020   Procedure: CATARACT EXTRACTION PHACO AND INTRAOCULAR LENS PLACEMENT LEFT EYE;  Surgeon: Fabio Pierce, MD;  Location: [facility];  Service: Ophthalmology;  Laterality: Left;  CDE 5.04   CATARACT EXTRACTION W/PHACO Right 10/17/2020   Procedure: CATARACT EXTRACTION PHACO AND INTRAOCULAR LENS PLACEMENT RIGHT EYE;  Surgeon: Fabio Pierce, MD;  Location: [facility];  Service:  Ophthalmology;  Laterality: Right;  CDE: 4.60   LEFT HEART CATH AND CORONARY ANGIOGRAPHY N/A 02/17/2021   Procedure: LEFT HEART CATH AND CORONARY ANGIOGRAPHY;  Surgeon: Tonny Bollman, MD;  Location: [facility] INVASIVE CV LAB;  Service: Cardiovascular;  Laterality: N/A;   TUBAL LIGATION       Allergies  Allergen Reactions   Aspirin Itching  States that she takes aspirin regularly without significant reaction.   Penicillins Rash      Family History  Problem Relation Age of Onset   Lung cancer Mother    Heart disease Brother    Breast cancer Sister    Heart disease Maternal Aunt    Cancer Cousin        lung   Anesthesia problems Neg Hx    Hypotension Neg Hx    Malignant hyperthermia Neg Hx    Pseudochol deficiency Neg Hx      Social History Ms. Callies reports that she quit smoking about 9 months ago. Her smoking use included cigarettes. She started smoking about 49 years ago. She has a 24.3 pack-year smoking history. She has never used smokeless tobacco. Ms. Linehan reports no history of alcohol use.    Physical Examination Today's Vitals   01/26/24 1010  BP: 122/68  Pulse: 75  SpO2: 96%  Weight: 278 lb 9.6 oz (126.4 kg)  Height: 5\' 8"  (1.727 m)   Body mass index is 42.36 kg/m.  Gen: resting comfortably, no acute distress HEENT: no scleral icterus, pupils equal round and reactive, no palptable cervical adenopathy,  CV: RRR, no mrg, no jvd Resp: Clear to auscultation bilaterally GI: abdomen is soft, non-tender, non-distended, normal bowel sounds, no hepatosplenomegaly MSK: extremities are warm, no edema.  Skin: warm, no rash Neuro:  no focal deficits Psych: appropriate affect   Diagnostic Studies  10/2012 Event monitor: no arrhythmias     08/2010 Echo:  Left ventricle: The cavity size was normal. Wall thickness was   normal. Systolic function was normal. The estimated ejection   fraction was in the range of 60% to 65%. Wall motion was normal;   there were no  regional wall motion abnormalities. The study is not   technically sufficient to allow evaluation of LV diastolic   function.   - Aortic valve: Mildly calcified annulus. Mild regurgitation.   - Mitral valve: Mild regurgitation.   - Tricuspid valve: Mild regurgitation.   - Pericardium, extracardiac: There was no pericardial effusion.     11/14/13 Clinic EKG   Sinus rhythm     10/2013 Echo   LVEF 60-65%, grade II diastolic dysfunction, mild AI, mild MR     01/23/14 Clinic EKG   NSR, LAE, no ischemic changes   01/2021 echo IMPRESSIONS     1. Left ventricular ejection fraction, by estimation, is 60 to 65%. The  left ventricle has normal function. The left ventricle has no regional  wall motion abnormalities. There is moderate concentric left ventricular  hypertrophy. Left ventricular  diastolic parameters are indeterminate. Elevated left ventricular  end-diastolic pressure.   2. Right ventricular systolic function is normal. The right ventricular  size is normal. Tricuspid regurgitation signal is inadequate for assessing  PA pressure.   3. The mitral valve is normal in structure. Trivial mitral valve  regurgitation. No evidence of mitral stenosis.   4. The aortic valve is calcified. Aortic valve regurgitation is mild.  Mild to moderate aortic valve sclerosis/calcification is present, without  any evidence of aortic stenosis. Focal dense calcification of the left  coornary cusp is present. Aortic  regurgitation PHT measures 543 msec.   5. The inferior vena cava is dilated in size with <50% respiratory  variability, suggesting right atrial pressure of 15 mmHg.   01/2021 cath 1. Widely patent coronary arteries with no obstructive CAD 2. Normal LV function     05/2023 echo [facility] Summary  1. The left ventricle is normal in size with normal wall thickness.    2. The left ventricular systolic function is normal, LVEF is visually  estimated at > 55%.    3. There is mild to  moderate aortic regurgitation.    4. The right ventricle is normal in size, with normal systolic function.    5. There is no evidence of an interatrial flow communication or  intrapulmonary shunt by agitated saline study.    Assessment and Plan  Resistant HTN -well controlled, continue current meds   2. Hyperlipidemia   -LDL is at goal, due to multiple CVAs goal LDL would be <55   3. Chronic diastolic heart failure   -no symptoms, euvolemic today - continue current meds   F/u 6 months      Antoine Poche, M.D.

## 2024-01-26 NOTE — Patient Instructions (Signed)
 Medication Instructions:  Continue all current medications.   Labwork: none  Testing/Procedures: none  Follow-Up: 6 months   Any Other Special Instructions Will Be Listed Below (If Applicable).   If you need a refill on your cardiac medications before your next appointment, please call your pharmacy.

## 2024-01-27 DIAGNOSIS — J449 Chronic obstructive pulmonary disease, unspecified: Secondary | ICD-10-CM | POA: Diagnosis not present

## 2024-01-27 DIAGNOSIS — R5383 Other fatigue: Secondary | ICD-10-CM | POA: Diagnosis not present

## 2024-01-27 DIAGNOSIS — Z515 Encounter for palliative care: Secondary | ICD-10-CM | POA: Diagnosis not present

## 2024-01-27 DIAGNOSIS — Z7409 Other reduced mobility: Secondary | ICD-10-CM | POA: Diagnosis not present

## 2024-01-27 DIAGNOSIS — R0600 Dyspnea, unspecified: Secondary | ICD-10-CM | POA: Diagnosis not present

## 2024-01-27 DIAGNOSIS — I679 Cerebrovascular disease, unspecified: Secondary | ICD-10-CM | POA: Diagnosis not present

## 2024-02-08 NOTE — Patient Instructions (Signed)
 Amber Harris  02/08/2024     @PREFPERIOPPHARMACY @   Your procedure is scheduled on  02/14/2024.   [facility] to [facility] at  0745 A.M.   Call this number if you have problems the morning of surgery:  [phone]  If you experience any cold or flu symptoms such as cough, fever, chills, shortness of breath, etc. between now and your scheduled surgery, please notify us at the above number.   Remember:       Your last dose of plavix should be on 02/08/2024.         Your last dose of jardiance should be on 02/10/2024.         DO  NOT take any medications for diabetes the morning of your procedure.   Follow the diet and prep instructions given to you by the office.           You may drink clear liquids until 0545 am on 02/14/2024.    Clear liquids allowed are:                    Water, Juice (No red color; non-citric and without pulp; diabetics please choose diet or no sugar options), Carbonated beverages (diabetics please choose diet or no sugar options), Clear Tea (No creamer, milk, or cream, including half & half and powdered creamer), Black Coffee Only (No creamer, milk or cream, including half & half and powdered creamer), and Clear Sports drink (No red color; diabetics please choose diet or no sugar options)          Use your inhaler before you come and bring your rescue inhaler with you.   Take these medicines the morning of surgery with A SIP OF WATER                       citalopram, gabapentin, omeprazole.    Do not wear jewelry, make-up or nail polish, including gel polish,  artificial nails, or any other type of covering on natural nails (fingers and  toes).  Do not wear lotions, powders, or perfumes, or deodorant.  Do not shave 48 hours prior to surgery.  Men may shave face and neck.  Do not bring valuables to the hospital.  [facility] is not responsible for any belongings or valuables.  Contacts, dentures or bridgework may not be worn into surgery.   Leave your suitcase in the car.  After surgery it may be brought to your room.  For patients admitted to the hospital, discharge time will be determined by your treatment team.  Patients discharged the day of surgery will not be allowed to drive home and must have someone with them for 24 hours.    Special instructions:   DO NOT smoke tobacco or vape for 24 hours before your procedure.  Please read over the following fact sheets that you were given. Anesthesia Post-op Instructions and Care and Recovery After Surgery      Colonoscopy, Adult, Care After The following information offers guidance on how to care for yourself after your procedure. Your health care provider may also give you more specific instructions. If you have problems or questions, contact your health care provider. What can I expect after the procedure? After the procedure, it is common to have: A small amount of blood in your stool for 24 hours after the procedure. Some gas. Mild cramping or bloating of your abdomen. Follow these instructions at home:  Eating and drinking  Drink enough fluid to keep your urine pale yellow. Follow instructions from your health care provider about eating or drinking restrictions. Resume your normal diet as told by your health care provider. Avoid heavy or fried foods that are hard to digest. Activity Rest as told by your health care provider. Avoid sitting for a long time without moving. Get up to take short walks every 1-2 hours. This is important to improve blood flow and breathing. Ask for help if you feel weak or unsteady. Return to your normal activities as told by your health care provider. Ask your health care provider what activities are safe for you. Managing cramping and bloating  Try walking around when you have cramps or feel bloated. If directed, apply heat to your abdomen as told by your health care provider. Use the heat source that your health care provider recommends,  such as a moist heat pack or a heating pad. Place a towel between your skin and the heat source. Leave the heat on for 20-30 minutes. Remove the heat if your skin turns bright red. This is especially important if you are unable to feel pain, heat, or cold. You have a greater risk of getting burned. General instructions If you were given a sedative during the procedure, it can affect you for several hours. Do not drive or operate machinery until your health care provider says that it is safe. For the first 24 hours after the procedure: Do not sign important documents. Do not drink alcohol. Do your regular daily activities at a slower pace than normal. Eat soft foods that are easy to digest. Take over-the-counter and prescription medicines only as told by your health care provider. Keep all follow-up visits. This is important. Contact a health care provider if: You have blood in your stool 2-3 days after the procedure. Get help right away if: You have more than a small spotting of blood in your stool. You have large blood clots in your stool. You have swelling of your abdomen. You have nausea or vomiting. You have a fever. You have increasing pain in your abdomen that is not relieved with medicine. These symptoms may be an emergency. Get help right away. Call 911. Do not wait to see if the symptoms will go away. Do not drive yourself to the hospital. Summary After the procedure, it is common to have a small amount of blood in your stool. You may also have mild cramping and bloating of your abdomen. If you were given a sedative during the procedure, it can affect you for several hours. Do not drive or operate machinery until your health care provider says that it is safe. Get help right away if you have a lot of blood in your stool, nausea or vomiting, a fever, or increased pain in your abdomen. This information is not intended to replace advice given to you by your health care provider.  Make sure you discuss any questions you have with your health care provider. Document Revised: 12/28/2022 Document Reviewed: 07/08/2021 Elsevier Patient Education  2024 [employer]General Anesthesia, Adult, Care After The following information offers guidance on how to care for yourself after your procedure. Your health care provider may also give you more specific instructions. If you have problems or questions, contact your health care provider. What can I expect after the procedure? After the procedure, it is common for people to: Have pain or discomfort at the IV site. Have nausea or vomiting. Have a sore  throat or hoarseness. Have trouble concentrating. Feel cold or chills. Feel weak, sleepy, or tired (fatigue). Have soreness and body aches. These can affect parts of the body that were not involved in surgery. Follow these instructions at home: For the time period you were told by your health care provider:  Rest. Do not participate in activities where you could fall or become injured. Do not drive or use machinery. Do not drink alcohol. Do not take sleeping pills or medicines that cause drowsiness. Do not make important decisions or sign legal documents. Do not take care of children on your own. General instructions Drink enough fluid to keep your urine pale yellow. If you have sleep apnea, surgery and certain medicines can increase your risk for breathing problems. Follow instructions from your health care provider about wearing your sleep device: Anytime you are sleeping, including during daytime naps. While taking prescription pain medicines, sleeping medicines, or medicines that make you drowsy. Return to your normal activities as told by your health care provider. Ask your health care provider what activities are safe for you. Take over-the-counter and prescription medicines only as told by your health care provider. Do not use any products that contain nicotine or  tobacco. These products include cigarettes, chewing tobacco, and vaping devices, such as e-cigarettes. These can delay incision healing after surgery. If you need help quitting, ask your health care provider. Contact a health care provider if: You have nausea or vomiting that does not get better with medicine. You vomit every time you eat or drink. You have pain that does not get better with medicine. You cannot urinate or have bloody urine. You develop a skin rash. You have a fever. Get help right away if: You have trouble breathing. You have chest pain. You vomit blood. These symptoms may be an emergency. Get help right away. Call 911. Do not wait to see if the symptoms will go away. Do not drive yourself to the hospital. Summary After the procedure, it is common to have a sore throat, hoarseness, nausea, vomiting, or to feel weak, sleepy, or fatigue. For the time period you were told by your health care provider, do not drive or use machinery. Get help right away if you have difficulty breathing, have chest pain, or vomit blood. These symptoms may be an emergency. This information is not intended to replace advice given to you by your health care provider. Make sure you discuss any questions you have with your health care provider. Document Revised: 02/12/2022 Document Reviewed: 02/12/2022 Elsevier Patient Education  2024 [employer].

## 2024-02-09 ENCOUNTER — Other Ambulatory Visit: Payer: Self-pay

## 2024-02-09 ENCOUNTER — Encounter (HOSPITAL_COMMUNITY): Payer: Self-pay

## 2024-02-09 ENCOUNTER — Encounter (HOSPITAL_COMMUNITY)
Admission: RE | Admit: 2024-02-09 | Discharge: 2024-02-09 | Disposition: A | Discharge status: Home / Self Care | Payer: 59 | Source: Ambulatory Visit | Attending: Gastroenterology | Admitting: Gastroenterology

## 2024-02-09 VITALS — BP 122/68 | HR 75 | Temp 98.0°F | Resp 18 | Ht 68.0 in | Wt 278.6 lb

## 2024-02-09 DIAGNOSIS — E119 Type 2 diabetes mellitus without complications: Secondary | ICD-10-CM | POA: Insufficient documentation

## 2024-02-09 DIAGNOSIS — E876 Hypokalemia: Secondary | ICD-10-CM | POA: Insufficient documentation

## 2024-02-09 DIAGNOSIS — Z01812 Encounter for preprocedural laboratory examination: Secondary | ICD-10-CM | POA: Insufficient documentation

## 2024-02-09 LAB — BASIC METABOLIC PANEL
Anion gap: 11 (ref 5–15)
BUN: 26 mg/dL — ABNORMAL HIGH (ref 8–23)
CO2: 25 mmol/L (ref 22–32)
Calcium: 9 mg/dL (ref 8.9–10.3)
Chloride: 103 mmol/L (ref 98–111)
Creatinine, Ser: 0.86 mg/dL (ref 0.44–1.00)
GFR, Estimated: >60 mL/min (ref >60)
Glucose, Bld: 117 mg/dL — ABNORMAL HIGH (ref 70–99)
Potassium: 3.2 mmol/L — ABNORMAL LOW (ref 3.5–5.1)
Sodium: 139 mmol/L (ref 135–145)

## 2024-02-14 ENCOUNTER — Telehealth (INDEPENDENT_AMBULATORY_CARE_PROVIDER_SITE_OTHER): Payer: Self-pay | Admitting: *Deleted

## 2024-02-14 ENCOUNTER — Ambulatory Visit (HOSPITAL_COMMUNITY): Admitting: Registered Nurse

## 2024-02-14 ENCOUNTER — Encounter (HOSPITAL_COMMUNITY)
Admission: RE | Disposition: A | Discharge status: Home / Self Care | Payer: Self-pay | Source: Home / Self Care | Attending: Gastroenterology

## 2024-02-14 ENCOUNTER — Ambulatory Visit (HOSPITAL_COMMUNITY)
Admission: RE | Admit: 2024-02-14 | Discharge: 2024-02-14 | Disposition: A | Discharge status: Home / Self Care | Payer: 59 | Attending: Gastroenterology | Admitting: Gastroenterology

## 2024-02-14 DIAGNOSIS — K648 Other hemorrhoids: Secondary | ICD-10-CM | POA: Insufficient documentation

## 2024-02-14 DIAGNOSIS — F32A Depression, unspecified: Secondary | ICD-10-CM | POA: Diagnosis not present

## 2024-02-14 DIAGNOSIS — R001 Bradycardia, unspecified: Secondary | ICD-10-CM | POA: Insufficient documentation

## 2024-02-14 DIAGNOSIS — D123 Benign neoplasm of transverse colon: Secondary | ICD-10-CM

## 2024-02-14 DIAGNOSIS — F17290 Nicotine dependence, other tobacco product, uncomplicated: Secondary | ICD-10-CM | POA: Diagnosis not present

## 2024-02-14 DIAGNOSIS — K219 Gastro-esophageal reflux disease without esophagitis: Secondary | ICD-10-CM | POA: Diagnosis not present

## 2024-02-14 DIAGNOSIS — Z794 Long term (current) use of insulin: Secondary | ICD-10-CM | POA: Insufficient documentation

## 2024-02-14 DIAGNOSIS — Z6841 Body Mass Index (BMI) 40.0 and over, adult: Secondary | ICD-10-CM | POA: Diagnosis not present

## 2024-02-14 DIAGNOSIS — E119 Type 2 diabetes mellitus without complications: Secondary | ICD-10-CM | POA: Diagnosis not present

## 2024-02-14 DIAGNOSIS — R55 Syncope and collapse: Secondary | ICD-10-CM | POA: Diagnosis not present

## 2024-02-14 DIAGNOSIS — R0602 Shortness of breath: Secondary | ICD-10-CM | POA: Insufficient documentation

## 2024-02-14 DIAGNOSIS — I10 Essential (primary) hypertension: Secondary | ICD-10-CM

## 2024-02-14 DIAGNOSIS — Z1211 Encounter for screening for malignant neoplasm of colon: Secondary | ICD-10-CM

## 2024-02-14 DIAGNOSIS — K5730 Diverticulosis of large intestine without perforation or abscess without bleeding: Secondary | ICD-10-CM

## 2024-02-14 DIAGNOSIS — F419 Anxiety disorder, unspecified: Secondary | ICD-10-CM | POA: Diagnosis not present

## 2024-02-14 DIAGNOSIS — R519 Headache, unspecified: Secondary | ICD-10-CM | POA: Diagnosis not present

## 2024-02-14 DIAGNOSIS — Z87891 Personal history of nicotine dependence: Secondary | ICD-10-CM | POA: Diagnosis not present

## 2024-02-14 DIAGNOSIS — Z7984 Long term (current) use of oral hypoglycemic drugs: Secondary | ICD-10-CM | POA: Diagnosis not present

## 2024-02-14 DIAGNOSIS — D122 Benign neoplasm of ascending colon: Secondary | ICD-10-CM

## 2024-02-14 DIAGNOSIS — E66813 Obesity, class 3: Secondary | ICD-10-CM | POA: Insufficient documentation

## 2024-02-14 DIAGNOSIS — K644 Residual hemorrhoidal skin tags: Secondary | ICD-10-CM | POA: Diagnosis not present

## 2024-02-14 DIAGNOSIS — I252 Old myocardial infarction: Secondary | ICD-10-CM | POA: Insufficient documentation

## 2024-02-14 DIAGNOSIS — E876 Hypokalemia: Secondary | ICD-10-CM

## 2024-02-14 DIAGNOSIS — K635 Polyp of colon: Secondary | ICD-10-CM | POA: Diagnosis not present

## 2024-02-14 DIAGNOSIS — J449 Chronic obstructive pulmonary disease, unspecified: Secondary | ICD-10-CM | POA: Diagnosis not present

## 2024-02-14 HISTORY — PX: POLYPECTOMY: SHX5525

## 2024-02-14 HISTORY — PX: COLONOSCOPY WITH PROPOFOL: SHX5780

## 2024-02-14 HISTORY — PX: SUBMUCOSAL INJECTION: SHX5543

## 2024-02-14 LAB — GLUCOSE, CAPILLARY: Glucose-Capillary: 170 mg/dL — ABNORMAL HIGH (ref 70–99)

## 2024-02-14 SURGERY — COLONOSCOPY WITH PROPOFOL
Anesthesia: General

## 2024-02-14 MED ORDER — SODIUM CHLORIDE 0.9% FLUSH: 3.0000 mL | Freq: Two times a day (BID) | INTRAVENOUS | Status: DC

## 2024-02-14 MED ORDER — SODIUM CHLORIDE 0.9% FLUSH: 3.0000 mL | INTRAVENOUS | Status: DC | PRN

## 2024-02-14 MED ORDER — PROPOFOL 500 MG/50ML IV EMUL
INTRAVENOUS | Status: DC | PRN
  Administered 2024-02-14: 100 ug/kg/min via INTRAVENOUS

## 2024-02-14 MED ORDER — LACTATED RINGERS IV SOLN: INTRAVENOUS | Status: DC | PRN

## 2024-02-14 MED ORDER — PROPOFOL 10 MG/ML IV BOLUS
INTRAVENOUS | Status: DC | PRN
  Administered 2024-02-14: 50 mg via INTRAVENOUS

## 2024-02-14 MED ORDER — LIDOCAINE HCL (CARDIAC) PF 100 MG/5ML IV SOSY
PREFILLED_SYRINGE | INTRAVENOUS | Status: DC | PRN
  Administered 2024-02-14: 40 mg via INTRAVENOUS

## 2024-02-14 NOTE — Discharge Instructions (Signed)
  Discharge instructions Please read the instructions outlined below and refer to this sheet in the next few weeks. These discharge instructions provide you with general information on caring for yourself after you leave the hospital. Your doctor may also give you specific instructions. While your treatment has been planned according to the most current medical practices available, unavoidable complications occasionally occur. If you have any problems or questions after discharge, please call your doctor. ACTIVITY You may resume your regular activity but move at a slower pace for the next 24 hours.  Take frequent rest periods for the next 24 hours.  Walking will help expel (get rid of) the air and reduce the bloated feeling in your abdomen.  No driving for 24 hours (because of the anesthesia (medicine) used during the test).  You may shower.  Do not sign any important legal documents or operate any machinery for 24 hours (because of the anesthesia used during the test).  NUTRITION Drink plenty of fluids.  You may resume your normal diet.  Begin with a light meal and progress to your normal diet.  Avoid alcoholic beverages for 24 hours or as instructed by your caregiver.  MEDICATIONS You may resume your normal medications unless your caregiver tells you otherwise.  WHAT YOU CAN EXPECT TODAY You may experience abdominal discomfort such as a feeling of fullness or "gas" pains.  FOLLOW-UP Your doctor will discuss the results of your test with you.  SEEK IMMEDIATE MEDICAL ATTENTION IF ANY OF THE FOLLOWING OCCUR: Excessive nausea (feeling sick to your stomach) and/or vomiting.  Severe abdominal pain and distention (swelling).  Trouble swallowing.  Temperature over 101 F (37.8 C).  Rectal bleeding or vomiting of blood.    Stop using high dose aspirin including Goody/BC powders, NSAIDs such as Aleve, ibuprofen, naproxen, Motrin, Voltaren or Advil (even the topical ones) for 14 days     I hope  you have a great rest of your week!   Vista Lawman , M.D.. Gastroenterology and Hepatology [facility]

## 2024-02-14 NOTE — H&P (Signed)
 Primary Care Physician:  Toma Deiters, MD Primary Gastroenterologist:  Dr. Tasia Catchings  Pre-Procedure History & Physical: HPI:  Amber Harris is a 63 y.o. female is here for a colonoscopy for colon cancer screening purposes.  Patient denies any family history of colorectal cancer.  No melena or hematochezia.  No abdominal pain or unintentional weight loss.  No change in bowel habits.  Overall feels well from a GI standpoint.  Past Medical History:  Diagnosis Date   Aortic valve disease    Long-standing systolic murmur   Asthma    Uses p.r.n. albuterol   Bronchitis    COPD (chronic obstructive pulmonary disease) (HCC)    Degenerative joint disease    right knee   Depression    Depression with anxiety    Diabetes mellitus    Dyspnea    exertion   Dyspnea on exertion    poor exercise tolerance   Fibroids    uterine; postmenopausal bleeding   GERD (gastroesophageal reflux disease)    Headache(784.0)    Hyperlipidemia    Hypertension    Myocardial infarction (HCC)    Obesity    Palpitations    Stroke (HCC)    TIA - left side residual   Syncope    exertional   Tobacco user    30 pack years; 04/2011: 1/4 pack per day during quick attempt    Past Surgical History:  Procedure Laterality Date   ABDOMINAL HYSTERECTOMY  12/21/2011   Procedure: HYSTERECTOMY ABDOMINAL;  Surgeon: Tilda Burrow, MD;  Location: [facility];  Service: Gynecology;  Laterality: N/A;   CATARACT EXTRACTION W/PHACO Left 10/03/2020   Procedure: CATARACT EXTRACTION PHACO AND INTRAOCULAR LENS PLACEMENT LEFT EYE;  Surgeon: Fabio Pierce, MD;  Location: [facility];  Service: Ophthalmology;  Laterality: Left;  CDE 5.04   CATARACT EXTRACTION W/PHACO Right 10/17/2020   Procedure: CATARACT EXTRACTION PHACO AND INTRAOCULAR LENS PLACEMENT RIGHT EYE;  Surgeon: Fabio Pierce, MD;  Location: [facility];  Service: Ophthalmology;  Laterality: Right;  CDE: 4.60   LEFT HEART CATH AND CORONARY ANGIOGRAPHY N/A 02/17/2021   Procedure:  LEFT HEART CATH AND CORONARY ANGIOGRAPHY;  Surgeon: Tonny Bollman, MD;  Location: [facility] INVASIVE CV LAB;  Service: Cardiovascular;  Laterality: N/A;   TUBAL LIGATION      Prior to Admission medications   Medication Sig Start Date End Date Taking? Authorizing Provider  albuterol (PROVENTIL,VENTOLIN) 90 MCG/ACT inhaler Inhale 2 puffs into the lungs every 6 (six) hours as needed for wheezing or shortness of breath.   Yes [provider]  aspirin EC 81 MG tablet Take 81 mg by mouth daily.   Yes [provider]  chlorthalidone (HYGROTON) 50 MG tablet Take 50 mg by mouth daily.   Yes [provider]  citalopram (CELEXA) 20 MG tablet Take 20 mg by mouth every morning. 02/11/23  Yes [provider]  Continuous Glucose Sensor (DEXCOM G7 SENSOR) MISC CHANGE EVERY 10 DAYS   Yes [provider]  ezetimibe (ZETIA) 10 MG tablet TAKE 1 TABLET BY MOUTH ONCE DAILY 11/24/23  Yes Branch, Dorothe Pea, MD  gabapentin (NEURONTIN) 300 MG capsule Take 300 mg by mouth 3 (three) times daily. 02/14/23  Yes [provider]  hydrALAZINE (APRESOLINE) 50 MG tablet TAKE ONE (1) TABLET BY MOUTH TWICE DAILY 12/26/23  Yes Branch, Dorothe Pea, MD  Insulin Disposable Pump (OMNIPOD DASH PODS, GEN 4,) MISC Change every 48 hours 10/22/23  Yes [provider]  insulin lispro (HUMALOG) 100 UNIT/ML injection  USE IN OMNIPOD. MAX 100 UNITS PER DAY *DISCONTINUE NOVOLOG*   Yes [provider]  methocarbamol (ROBAXIN) 500 MG tablet Take 500 mg by mouth at bedtime. 01/14/21  Yes [provider]  omeprazole (PRILOSEC) 20 MG capsule Take 20 mg by mouth daily.   Yes [provider]  risperiDONE (RISPERDAL) 0.5 MG tablet Take 0.5 mg by mouth at bedtime.   Yes [provider]  rosuvastatin (CRESTOR) 40 MG tablet TAKE 1 TABLET BY MOUTH EVERY DAY AT BEDTIME 08/12/23  Yes Branch, Dorothe Pea, MD  clopidogrel (PLAVIX) 75 MG tablet Take 75 mg by mouth every morning.  02/11/23   [provider]  JARDIANCE 25 MG TABS tablet Take 25 mg by mouth daily.  03/24/16   [provider]    Allergies as of 01/17/2024 - Review Complete 01/16/2024  Allergen Reaction Noted   Aspirin Itching 05/10/2011   Penicillins Rash 05/10/2011    Family History  Problem Relation Age of Onset   Lung cancer Mother    Heart disease Brother    Breast cancer Sister    Heart disease Maternal Aunt    Cancer Cousin        lung   Anesthesia problems Neg Hx    Hypotension Neg Hx    Malignant hyperthermia Neg Hx    Pseudochol deficiency Neg Hx     Social History   Socioeconomic History   Marital status: Single    Spouse name: Not on file   Number of children: 4   Years of education: Not on file   Highest education level: Not on file  Occupational History   Occupation: unemployed  Tobacco Use   Smoking status: Former    Current packs/day: 0.00    Average packs/day: 0.5 packs/day for 48.5 years (24.3 ttl pk-yrs)    Types: Cigarettes    Start date: 10/02/1974    Quit date: 04/19/2023    Years since quitting: 0.8   Smokeless tobacco: Never   Tobacco comments:    started back about a month ago - 3-4/day - smokes marijuana  for pain   Vaping Use   Vaping status: Every Day  Substance and Sexual Activity   Alcohol use: No    Alcohol/week: 0.0 standard drinks of alcohol   Drug use: Yes    Types: Marijuana   Sexual activity: Not Currently    Birth control/protection: Surgical  Other Topics Concern   Not on file  Social History Narrative   Lives with friend (21 years) in a trailer   Patient has a GED.   Patient is disabled.   Patient is right handed.   Not Drinking caffeine currently.   Social Drivers of Corporate investment banker Strain: Low Risk  (07/30/2023)   Received from [facility]   Overall Financial Resource Strain (CARDIA)    Difficulty of Paying Living Expenses: Not hard at all  Food Insecurity: Food Insecurity Present (09/27/2023)    Hunger Vital Sign    Worried About Running Out of Food in the Last Year: Sometimes true    Ran Out of Food in the Last Year: Sometimes true  Transportation Needs: No Transportation Needs (11/14/2023)   Received from [facility] - Transportation    Lack of Transportation (Medical): No    Lack of Transportation (Non-Medical): No  Physical Activity: Insufficiently Active (09/27/2023)   Exercise Vital Sign    Days of Exercise per Week: 2 days    Minutes of  Exercise per Session: 40 min  Stress: Stress Concern Present (05/26/2020)   Received from [facility]   [facility] - Occupational Stress Questionnaire    Feeling of Stress : Rather much  Social Connections: Unknown (04/12/2022)   Received from [facility], [facility]   Social Network    Social Network: Not on file  Intimate Partner Violence: Not At Risk (12/14/2023)   Received from [facility]   Humiliation, Afraid, Rape, and Kick questionnaire    Fear of Current or Ex-Partner: No    Emotionally Abused: No    Physically Abused: No    Sexually Abused: No    Review of Systems: See HPI, otherwise negative ROS  Physical Exam: Vital signs in last 24 hours: Temp:  [97.9 F (36.6 C)] 97.9 F (36.6 C) (03/18 0858) Pulse Rate:  [46] 46 (03/18 0858) Resp:  [14] 14 (03/18 0858) BP: (148)/(64) 148/64 (03/18 0858) SpO2:  [99 %] 99 % (03/18 0858) Weight:  [126.4 kg] 126.4 kg (03/18 0858)   General:   Alert,  Well-developed, well-nourished, pleasant and cooperative in NAD Head:  Normocephalic and atraumatic. Eyes:  Sclera clear, no icterus.   Conjunctiva pink. Ears:  Normal auditory acuity. Nose:  No deformity, discharge,  or lesions. Msk:  Symmetrical without gross deformities. Normal posture. Extremities:  Without clubbing or edema. Neurologic:  Alert and  oriented x4;  grossly normal neurologically. Skin:  Intact without significant lesions or rashes. Psych:   Alert and cooperative. Normal mood and affect.  Impression/Plan: Amber Harris is here for a colonoscopy to be performed for colon cancer screening purposes.  The risks of the procedure including infection, bleed, or perforation as well as benefits, limitations, alternatives and imponderables have been reviewed with the patient. Questions have been answered. All parties agreeable.

## 2024-02-14 NOTE — Telephone Encounter (Signed)
 Per [facility] op note - Repeat colonoscopy in 3 months for surveillance                            with extended bowel prep

## 2024-02-14 NOTE — Transfer of Care (Signed)
 Immediate Anesthesia Transfer of Care Note  Patient: Amber Harris  Procedure(s) Performed: COLONOSCOPY WITH PROPOFOL INJECTION, SUBMUCOSAL POLYPECTOMY  Patient Location: PACU  Anesthesia Type:General  Level of Consciousness: awake  Airway & Oxygen Therapy: Patient Spontanous Breathing  Post-op Assessment: Report given to RN and Post -op Vital signs reviewed and stable  Post vital signs: Reviewed and stable  Last Vitals:  Vitals Value Taken Time  BP    Temp    Pulse    Resp    SpO2      Last Pain:  Vitals:   02/14/24 1003  TempSrc:   PainSc: 0-No pain      Patients Stated Pain Goal: 5 (02/14/24 0858)  Complications: No notable events documented.

## 2024-02-14 NOTE — Op Note (Signed)
 [facility] Patient Name: Amber Harris Procedure Date: 02/14/2024 9:51 AM MRN: [id number] Date of Birth: 1961/02/17 Attending MD: Sanjuan Dame , MD, [id number] CSN: [id number] Age: 63 Admit Type: Outpatient Procedure:                Colonoscopy Indications:              Screening for colorectal malignant neoplasm Providers:                Sanjuan Dame, MD, Francoise Ceo RN, RN, Dyann Ruddle Referring MD:              Medicines:                Monitored Anesthesia Care Complications:            No immediate complications. Estimated Blood Loss:     Estimated blood loss: none. Procedure:                Pre-Anesthesia Assessment:                           - Prior to the procedure, a History and Physical                            was performed, and patient medications and                            allergies were reviewed. The patient's tolerance of                            previous anesthesia was also reviewed. The risks                            and benefits of the procedure and the sedation                            options and risks were discussed with the patient.                            All questions were answered, and informed consent                            was obtained. Prior Anticoagulants: The patient has                            taken Plavix (clopidogrel), last dose was 5 days                            prior to procedure. ASA Grade Assessment: III - A                            patient with severe systemic disease. After                            reviewing the risks and benefits, the patient was  deemed in satisfactory condition to undergo the                            procedure.                           After obtaining informed consent, the colonoscope                            was passed under direct vision. Throughout the                            procedure, the patient's blood pressure, pulse, and                             oxygen saturations were monitored continuously. The                            [id number]-HQ190L ([id number]) scope was introduced through                            the anus and advanced to the the cecum, identified                            by appendiceal orifice and ileocecal valve. The                            colonoscopy was performed without difficulty. The                            patient tolerated the procedure well. The quality                            of the bowel preparation was evaluated using the                            BBPS [facility] Bowel Preparation Scale) with scores                            of: Right Colon = 2 (minor amount of residual                            staining, small fragments of stool and/or opaque                            liquid, but mucosa seen well), Transverse Colon = 2                            (minor amount of residual staining, small fragments                            of stool and/or opaque liquid, but mucosa seen  well) and Left Colon = 2 (minor amount of residual                            staining, small fragments of stool and/or opaque                            liquid, but mucosa seen well). The total BBPS score                            equals 6. The ileocecal valve, appendiceal orifice,                            and rectum were photographed. Scope In: 10:12:27 AM Scope Out: 10:50:31 AM Scope Withdrawal Time: 0 hours 34 minutes 54 seconds  Total Procedure Duration: 0 hours 38 minutes 4 seconds  Findings:      The perianal and digital rectal examinations were normal.      A 15 mm polyp was found in the ascending colon. The polyp was flat.       Preparations were made for mucosal resection. Demarcation of the lesion       was performed with high-definition white light and narrow band imaging       to clearly identify the boundaries of the lesion. Eleview was injected       to raise the lesion. Snare mucosal  resection was performed. Resection       and retrieval were complete. Resected tissue margins were examined and       clear of polyp tissue.      A 5 mm polyp was found in the transverse colon. The polyp was sessile.       The polyp was removed with a cold snare. Resection and retrieval were       complete.      Scattered small-mouthed diverticula were found in the entire colon.      Non-bleeding external and internal hemorrhoids were found during       retroflexion. The hemorrhoids were small. Impression:               - One 15 mm polyp in the ascending colon, removed                            with mucosal resection. Resected and retrieved.                           - One 5 mm polyp in the transverse colon, removed                            with a cold snare. Resected and retrieved.                           - Diverticulosis in the entire examined colon.                           - Non-bleeding external and internal hemorrhoids.                           -  Mucosal resection was performed. Resection and                            retrieval were complete. Moderate Sedation:      Per Anesthesia Care Recommendation:           - Patient has a contact number available for                            emergencies. The signs and symptoms of potential                            delayed complications were discussed with the                            patient. Return to normal activities tomorrow.                            Written discharge instructions were provided to the                            patient.                           - High fiber diet.                           - Continue present medications.                           - Await pathology results.                           - Repeat colonoscopy in 3 months for surveillance                            with extended bowel prep                           - Return to primary care physician as previously                             scheduled. Procedure Code(s):        --- Professional ---                           [id number], Colonoscopy, flexible; with endoscopic                            mucosal resection                           45385, 59, Colonoscopy, flexible; with removal of                            tumor(s), polyp(s), or other lesion(s) by snare  technique Diagnosis Code(s):        --- Professional ---                           Z12.11, Encounter for screening for malignant                            neoplasm of colon                           D12.2, Benign neoplasm of ascending colon                           D12.3, Benign neoplasm of transverse colon (hepatic                            flexure or splenic flexure)                           K64.8, Other hemorrhoids                           K57.30, Diverticulosis of large intestine without                            perforation or abscess without bleeding CPT copyright 2022 American Medical Association. All rights reserved. The codes documented in this report are preliminary and upon coder review may  be revised to meet current compliance requirements. Sanjuan Dame, MD Sanjuan Dame, MD 02/14/2024 10:59:03 AM This report has been signed electronically. Number of Addenda: 0

## 2024-02-14 NOTE — Anesthesia Preprocedure Evaluation (Addendum)
 Anesthesia Evaluation  Patient identified by MRN, date of birth, ID band Patient awake    Reviewed: Allergy & Precautions, H&P , NPO status , Patient's Chart, lab work & pertinent test results, reviewed documented beta blocker date and time   History of Anesthesia Complications Negative for: history of anesthetic complications  Airway Mallampati: II  TM Distance: >3 FB Neck ROM: Full    Dental no notable dental hx. (+) Dental Advisory Given, Teeth Intact No notable dental injury:   Pulmonary shortness of breath and with exertion, asthma , COPD, Patient abstained from smoking., former smoker   Pulmonary exam normal breath sounds clear to auscultation       Cardiovascular Exercise Tolerance: Good hypertension, Pt. on medications + Past MI  Normal cardiovascular exam Rhythm:Regular Rate:Normal  Sinus  Bradycardia  -Poor R-wave progression -nonspecific -consider old anterior infarct.   -Anterolateral ST-elevation -repolarization variant.   BORDERLINE  syncope    Neuro/Psych  Headaches PSYCHIATRIC DISORDERS Anxiety Depression    CVA, Residual Symptoms  negative psych ROS   GI/Hepatic Neg liver ROS,GERD  ,,  Endo/Other  diabetes, Well Controlled, Type 2, Insulin Dependent, Oral Hypoglycemic Agents  Class 3 obesity  Renal/GU negative Renal ROS bladder dysfunction   negative genitourinary   Musculoskeletal  (+) Arthritis ,    Abdominal   Peds  Hematology negative hematology ROS (+)   Anesthesia Other Findings   Reproductive/Obstetrics negative OB ROS                             Anesthesia Physical Anesthesia Plan  ASA: 3  Anesthesia Plan: General   Post-op Pain Management:    Induction: Intravenous  PONV Risk Score and Plan: Propofol infusion  Airway Management Planned: Nasal Cannula and Natural Airway  Additional Equipment: None  Intra-op Plan:   Post-operative Plan:    Informed Consent: I have reviewed the patients History and Physical, chart, labs and discussed the procedure including the risks, benefits and alternatives for the proposed anesthesia with the patient or authorized representative who has indicated his/her understanding and acceptance.     Dental advisory given  Plan Discussed with: CRNA  Anesthesia Plan Comments:         Anesthesia Quick Evaluation

## 2024-02-14 NOTE — Telephone Encounter (Signed)
 Will place in Future Schedule folder and call pt once June schedule is out

## 2024-02-14 NOTE — Anesthesia Postprocedure Evaluation (Signed)
 Anesthesia Post Note  Patient: Amber Harris  Procedure(s) Performed: COLONOSCOPY WITH PROPOFOL INJECTION, SUBMUCOSAL POLYPECTOMY  Patient location during evaluation: PACU Anesthesia Type: General Level of consciousness: awake and alert Pain management: pain level controlled Vital Signs Assessment: post-procedure vital signs reviewed and stable Respiratory status: spontaneous breathing, nonlabored ventilation, respiratory function stable and patient connected to nasal cannula oxygen Cardiovascular status: blood pressure returned to baseline and stable Postop Assessment: no apparent nausea or vomiting Anesthetic complications: no   There were no known notable events for this encounter.   Last Vitals:  Vitals:   02/14/24 0858 02/14/24 1056  BP: (!) 148/64 104/69  Pulse: (!) 46 (!) 49  Resp: 14 14  Temp: 36.6 C 36.4 C  SpO2: 99% 98%    Last Pain:  Vitals:   02/14/24 1056  TempSrc: Oral  PainSc: 0-No pain                 Chrstopher Malenfant L Otis Burress

## 2024-02-15 ENCOUNTER — Encounter (HOSPITAL_COMMUNITY): Payer: Self-pay | Admitting: Gastroenterology

## 2024-02-15 LAB — SURGICAL PATHOLOGY

## 2024-02-16 NOTE — Progress Notes (Signed)
 I reviewed the pathology results. [name], can you send her a letter with the findings as described below please?  Repeat colonoscopy in 3 years  Thanks,  Vista Lawman, MD Gastroenterology and Hepatology [facility]  ---------------------------------------------------------------------------------------------  [facility] [address], [address], [place], [place] [address] Phone:  [phone]   02/16/24 Amber Harris, [place]   Dear [name],  I am writing to inform you that the biopsies taken during your recent endoscopic examination showed:  FINAL MICROSCOPIC DIAGNOSIS:   A. COLON, ASCENDING/TRANSVERSE, POLYPECTOMY:  - Tubular adenoma without high-grade dysplasia or malignancy  - Sessile serrated polyp(s) without cytologic dysplasia   What does this mean?  You had a total of 2 polyps removed. The pathology came back as "tubular adenoma and sessile serrated polyp ." These findings are NOT cancer, but had the polyps remained in your colon, they could have turned into cancer.  Given these findings, it is recommended that your next colonoscopy be performed in 3 years.  Also I value your feedback , so if you get a survey , please take the time to fill it out and thank you for choosing [facility]  Please call us at [phone] if you have persistent problems or have questions about your condition that have not been fully answered at this time.  Sincerely,  Vista Lawman, MD Gastroenterology and Hepatology

## 2024-02-20 ENCOUNTER — Encounter (INDEPENDENT_AMBULATORY_CARE_PROVIDER_SITE_OTHER): Payer: Self-pay | Admitting: *Deleted

## 2024-02-20 DIAGNOSIS — I119 Hypertensive heart disease without heart failure: Secondary | ICD-10-CM | POA: Diagnosis not present

## 2024-02-20 DIAGNOSIS — Z880 Allergy status to penicillin: Secondary | ICD-10-CM | POA: Diagnosis not present

## 2024-02-20 DIAGNOSIS — E119 Type 2 diabetes mellitus without complications: Secondary | ICD-10-CM | POA: Diagnosis not present

## 2024-02-20 DIAGNOSIS — Z886 Allergy status to analgesic agent status: Secondary | ICD-10-CM | POA: Diagnosis not present

## 2024-02-20 DIAGNOSIS — Z7902 Long term (current) use of antithrombotics/antiplatelets: Secondary | ICD-10-CM | POA: Diagnosis not present

## 2024-02-20 DIAGNOSIS — Z79899 Other long term (current) drug therapy: Secondary | ICD-10-CM | POA: Diagnosis not present

## 2024-02-20 DIAGNOSIS — Z87891 Personal history of nicotine dependence: Secondary | ICD-10-CM | POA: Diagnosis not present

## 2024-02-20 DIAGNOSIS — G500 Trigeminal neuralgia: Secondary | ICD-10-CM | POA: Diagnosis not present

## 2024-02-20 DIAGNOSIS — Z7982 Long term (current) use of aspirin: Secondary | ICD-10-CM | POA: Diagnosis not present

## 2024-02-20 DIAGNOSIS — Z8673 Personal history of transient ischemic attack (TIA), and cerebral infarction without residual deficits: Secondary | ICD-10-CM | POA: Diagnosis not present

## 2024-02-20 DIAGNOSIS — Z794 Long term (current) use of insulin: Secondary | ICD-10-CM | POA: Diagnosis not present

## 2024-02-20 DIAGNOSIS — Z91013 Allergy to seafood: Secondary | ICD-10-CM | POA: Diagnosis not present

## 2024-02-20 DIAGNOSIS — R519 Headache, unspecified: Secondary | ICD-10-CM | POA: Diagnosis not present

## 2024-02-20 DIAGNOSIS — Z7984 Long term (current) use of oral hypoglycemic drugs: Secondary | ICD-10-CM | POA: Diagnosis not present

## 2024-02-20 DIAGNOSIS — J4489 Other specified chronic obstructive pulmonary disease: Secondary | ICD-10-CM | POA: Diagnosis not present

## 2024-02-20 DIAGNOSIS — G44209 Tension-type headache, unspecified, not intractable: Secondary | ICD-10-CM | POA: Diagnosis not present

## 2024-02-20 NOTE — ED Provider Notes (Signed)
 Emergency Department Provider Note    ED Clinical Impression   Final diagnoses:  Acute non intractable tension-type headache (Primary)  Trigeminal neuralgia    ED Assessment/Plan    Condition: Stable Disposition: Discharge  This chart has been completed using Dragon Medical Dictation software, and while attempts have been made to ensure accuracy, certain words and phrases may not be transcribed as intended.   History   Chief Complaint  Patient presents with  . Headache New Onset or New Symptoms   HPI  Amber Harris is a 63 y.o. female  who presents today to the  emergency department complaining of right temporal head pain.  Patient states that this time the headache but pain on the right side of her head.  She describes sharp shooting pains like electricity.  Symptoms been going on since yesterday.  She denies any neck pain or stiffness.  She denies any trauma.  She denies any photophobia.  She discussed her symptoms as moderate.    Allergies: is allergic to aspirin , fish containing products, and penicillins. Medications: has a current medication list which includes the following long-term medication(s): aspirin , citalopram, clopidogrel , dexcom g7 receiver, dexcom g7 sensor, empagliflozin , gabapentin , insulin  aspart, losartan, mirtazapine, olmesartan, omnipod dash pdm kit (gen 4), and risperidone . PMHx:  has a past medical history of Arthritis, Asthma, Atrial septal defect, COPD (chronic obstructive pulmonary disease) (CMS-HCC), Depression, Diabetes (CMS-HCC), History of heart attack, Hypertension, Schizophrenia (CMS-HCC), and Stroke (cerebrum) (CMS-HCC). PSHx:  has a past surgical history that includes Hysterectomy (2015); Tubal ligation; and Breast biopsy (Left). SocHx:  reports that she has quit smoking. Her smoking use included cigarettes. She has a 10 pack-year smoking history. She has  never used smokeless tobacco. She reports that she does not currently use alcohol. She reports that she does not currently use drugs. Allergies, Medications, Medical, Surgical, and Social History were reviewed as documented above.   Social Drivers of Health with Concerns   Food Insecurity: Food Insecurity Present (09/27/2023)   Received from [facility]   Hunger Vital Sign   . Worried About Programme researcher, broadcasting/film/video in the Last Year: Sometimes true   . Ran Out of Food in the Last Year: Sometimes true  Tobacco Use: Medium Risk (01/26/2024)   Received from [facility]   Patient History   . Smoking Tobacco Use: Former   . Smokeless Tobacco Use: Never   . Passive Exposure: Not on file  Physical Activity: Insufficiently Active (09/27/2023)   Received from [facility]   Exercise Vital Sign   . Days of Exercise per Week: 2 days   . Minutes of Exercise per Session: 40 min  Stress: Stress Concern Present (05/26/2020)   [employer] of Occupational Health - Occupational Stress Questionnaire   . Feeling of Stress : Rather much  Substance Use: Not on file (10/05/2023)  Social Connections: Unknown (04/12/2022)   Received from [facility]   Social Network   . Social Network: Not on file  Depression: Not at risk (12/14/2023)   PHQ-2   . PHQ-2 Score: 0  Recent Concern: Depression - At risk (11/08/2023)   PHQ-2   . PHQ-2 Score: 5  Internet Connectivity: Not on file     Review Of Systems  Review of Systems  Constitutional:  Negative for  fever.  HENT:  Negative for congestion.   Respiratory:  Negative for chest tightness and shortness of breath.   Cardiovascular:  Negative for chest pain.  Gastrointestinal:  Negative for abdominal pain.  Skin:  Negative for color change.  Neurological:  Positive for headaches.  Psychiatric/Behavioral:  Negative for behavioral problems.   All other systems reviewed and are negative.   Physical Exam   BP 139/74   Pulse 70   Temp 36.9  C (98.4 F) (Oral)   Resp 18   Ht 175.3 cm (5' 9)   Wt (!) 126.8 kg (279 lb 8.7 oz)   SpO2 98%   BMI 41.28 kg/m   Physical Exam Vitals and nursing note reviewed.  Constitutional:      General: She is not in acute distress. HENT:     Head: Normocephalic.     Comments: Tenderness of the right temporal scalp.  I do not appreciate any rash. Eyes:     Conjunctiva/sclera: Conjunctivae normal.  Cardiovascular:     Rate and Rhythm: Regular rhythm.     Pulses: Normal pulses.     Heart sounds: Normal heart sounds.  Pulmonary:     Effort: No respiratory distress.     Breath sounds: Normal breath sounds.  Abdominal:     General: There is no distension.  Musculoskeletal:        General: No deformity.  Skin:    General: Skin is warm.     Capillary Refill: Capillary refill takes 2 to 3 seconds.     Comments: Normal cap refill.  Neurological:     General: No focal deficit present.     Mental Status: She is oriented to person, place, and time.     Cranial Nerves: No cranial nerve deficit.     Sensory: No sensory deficit.     Motor: No weakness.     Comments: There is no motor, sensory or cerebellar deficits.  Nonfocal neurologic exam.  GCS is 15.  Cranial nerves grossly intact.  NIH stroke scale is 0.  Psychiatric:        Mood and Affect: Mood normal.     ED Course  Medical Decision Making Differential includes trigeminal neuralgia versus tension headache versus temporal arteritis.  No clinical suspicion for subarachnoid hemorrhage.  Patient did have a CTA done in December and was no aneurysm described.  It is very unlikely that she will develop a significant aneurysm in 3 months.  Will get a Noncon CT to rule out any overt lesion.  Will treat patient symptomatically.  Will check sed rate.  2:47 PM Patient reevaluated.  Patient is doing well.  Feels better.  Labs and imaging reviewed.  Sed rate is slightly elevated.  Patient is diabetic so I am worried about putting her on  prednisone .  I do not think this is temporal neuritis since sed rate is only 52.  Her symptoms are most likely consistent with tension headache versus trigeminal neuralgia.  Will put her on Fioricet for pain.  Will refer her to her PCP for further management.     Procedures   No results found for this visit on 02/20/24 (from the past 4464 hours).   ED Results Results for orders placed or performed during the hospital encounter of 02/20/24  Basic Metabolic Panel  Result Value Ref Range   Sodium 143 135 - 145 mmol/L   Potassium 3.5 3.5 - 5.0 mmol/L   Chloride 106 98 - 107 mmol/L   CO2 27.9  21.0 - 32.0 mmol/L   Anion Gap 9 3 - 11 mmol/L   BUN 21 (H) 8 - 20 mg/dL   Creatinine 8.97 9.39 - 1.10 mg/dL   BUN/Creatinine Ratio 21    eGFR CKD-EPI (2021) Female 64 >=60 mL/min/1.50m2   Glucose 127 70 - 179 mg/dL   Calcium  8.9 8.5 - 10.1 mg/dL  Sedimentation rate, manual  Result Value Ref Range   Sed Rate 52 (H) 20 - 30 mm/h  CBC w/ Differential  Result Value Ref Range   WBC 5.5 4.0 - 10.5 10*9/L   RBC 4.12 3.80 - 5.10 10*12/L   HGB 12.5 11.5 - 15.0 g/dL   HCT 62.5 65.9 - 55.9 %   MCV 90.8 80.0 - 98.0 fL   MCH 30.3 27.0 - 34.0 pg   MCHC 33.4 32.0 - 36.0 g/dL   RDW 86.4 88.4 - 85.4 %   MPV 11.7 (H) 7.4 - 10.4 fL   Platelet 150 140 - 415 10*9/L   Neutrophils % 38.3 %   Lymphocytes % 52.0 %   Monocytes % 8.5 %   Eosinophils % 0.4 %   Basophils % 0.4 %   Absolute Neutrophils 2.1 1.8 - 7.8 10*9/L   Absolute Lymphocytes 2.9 0.7 - 4.5 10*9/L   Absolute Monocytes 0.5 0.1 - 1.0 10*9/L   Absolute Eosinophils 0.0 0.0 - 0.4 10*9/L   Absolute Basophils 0.0 0.0 - 0.2 10*9/L   CT head without contrast Result Date: 02/20/2024 Exam:  CT Head without Contrast History:  Headache Technique: Routine brain CT without IV contrast. AEC (automated exposure control) and/or manual techniques such as size-specific kV and mAs are employed where appropriate to reduce radiation exposure for all CT exams.  Comparison:  11/13/2023 Findings: Ventricles and sulci are appropriate for the patient's age. Small focus of encephalomalacia is seen in the right cerebellar hemisphere. Gray-white differentiation is preserved. No mass effect, midline shift, or extra-axial fluid collection. No intracranial hemorrhage. Basal cisterns are patent. Atherosclerotic calcifications are seen in the intracranial internal carotid arteries. Status post bilateral lens extraction. Paranasal sinuses and mastoid air cells are clear.   No acute intracranial process. Signed (Electronic Signature): 02/20/2024 1:03 PM Signed By: Dorothyann Jointer, MD   Medications Administered:  Medications  morphine  4 mg/mL injection 4 mg (4 mg Intravenous Given 02/20/24 1320)  dexAMETHasone  (DECADRON ) injection 10 mg (10 mg Intramuscular Given 02/20/24 1323)  ondansetron  (ZOFRAN ) injection 4 mg (4 mg Intravenous Given 02/20/24 1322)    Discharge Medications (Medications Prescribed during this  ED visit and Patient's Home Medications) :    Your Medication List     START taking these medications    traMADol 50 mg tablet Commonly known as: ULTRAM Take 1 tablet (50 mg total) by mouth every six (6) hours as needed for pain for up to 5 days.       ASK your doctor about these medications    ACCU-CHEK GUIDE TEST STRIPS Strp Generic drug: blood sugar diagnostic   ACCU-CHEK GUIDE TEST STRIPS Strp Generic drug: blood sugar diagnostic   albuterol  sulfate 90 mcg/actuation Aepb Inhale daily as needed.   aspirin  81 MG tablet Commonly known as: ECOTRIN Take 1 tablet (81 mg total) by mouth.   chlorthalidone  50 MG tablet Commonly known as: HYGROTON  Take 1 tablet (50 mg total) by mouth.   citalopram 20 MG tablet Commonly known as: CeleXA Take 1 tablet (20 mg total) by mouth.   cloNIDine  HCL 0.3 MG tablet Commonly known as: CATAPRES  Take  1 tablet (0.3 mg total) by mouth.   clopidogrel  75 mg tablet Commonly known as: PLAVIX  Take 1 tablet  (75 mg total) by mouth daily.   DEXCOM G7 RECEIVER Misc Generic drug: blood-glucose meter,continuous Use as directed.   DEXCOM G7 SENSOR Devi Generic drug: blood-glucose sensor CHANGE EVERY 10 DAYS   DULoxetine  30 MG capsule Commonly known as: CYMBALTA    ezetimibe  10 mg tablet Commonly known as: ZETIA  Take 1 tablet (10 mg total) by mouth daily.   fish oil-omega-3 fatty acids 300-1,000 mg capsule Take 1 capsule (1 g total) by mouth three (3) times a day.   furosemide  40 MG tablet Commonly known as: LASIX  Take 1 tablet (40 mg total) by mouth daily.   gabapentin  300 MG capsule Commonly known as: NEURONTIN  Take 1 capsule (300 mg total) by mouth Three (3) times a day.   HumaLOG U-100 Insulin  100 unit/mL injection Generic drug: insulin  lispro USE with omnipod. Max Daily Dose 100 UNITS. Discard AFTER 28 DAYS.   hydrALAZINE  50 MG tablet Commonly known as: APRESOLINE  Take 1 tablet (50 mg total) by mouth.   ibuprofen 800 MG tablet Commonly known as: MOTRIN Take 1 tablet (800 mg total) by mouth.   insulin  aspart 100 unit/mL vial Commonly known as: NovoLOG  Inject 0.18 mL (18 Units total) under the skin Three (3) times a day before meals.   JARDIANCE  25 mg tablet Generic drug: empagliflozin  Take 1 tablet (25 mg total) by mouth.   losartan 100 MG tablet Commonly known as: COZAAR Take 1 tablet (100 mg total) by mouth.   methocarbamol  500 MG tablet Commonly known as: ROBAXIN  Take 1 tablet (500 mg total) by mouth at bedtime.   mirtazapine 15 MG tablet Commonly known as: REMERON Take 1 tablet (15 mg total) by mouth.   olmesartan 40 MG tablet Commonly known as: BENICAR TAKE 1 TABLET BY MOUTH DAILY *STOP LOSARTAN*   omeprazole  20 MG capsule Commonly known as: PriLOSEC Take 1 capsule (20 mg total) by mouth.   OMNIPOD DASH PDM KIT (GEN 4) Misc Generic drug: insulin  pump controller Use as directed.   OMNIPOD DASH PODS (GEN 4) Crtg Generic drug: insulin  pump cart,cont  inf,BT CHANGE EVERY 48 HOURS   ondansetron  4 MG tablet Commonly known as: ZOFRAN  TAKE 1 TABLET BY MOUTH TWICE DAILY FOR 5 DAYS FOR NAUSEA & VOMITING   oxybutynin 5 MG 24 hr tablet Commonly known as: DITROPAN-XL Take 1 tablet (5 mg total) by mouth.   risperiDONE  0.5 MG tablet Commonly known as: RisperDAL  Take 1 tablet (0.5 mg total) by mouth.   rosuvastatin  10 MG tablet Commonly known as: CRESTOR  Take 1 tablet (10 mg total) by mouth.   topiramate  50 MG tablet Commonly known as: Topamax  Take 1 tablet (50 mg total) by mouth.   TRELEGY ELLIPTA 100-62.5-25 mcg inhaler Generic drug: fluticasone -umeclidin-vilanter Inhale 1 puff.          Cherie Ardeen Hanger, MD 02/20/24 [phone]

## 2024-03-01 DIAGNOSIS — M5459 Other low back pain: Secondary | ICD-10-CM | POA: Diagnosis not present

## 2024-03-01 DIAGNOSIS — T2112XA Burn of first degree of abdominal wall, initial encounter: Secondary | ICD-10-CM | POA: Diagnosis not present

## 2024-03-01 DIAGNOSIS — N1831 Chronic kidney disease, stage 3a: Secondary | ICD-10-CM | POA: Diagnosis not present

## 2024-03-01 DIAGNOSIS — Z8673 Personal history of transient ischemic attack (TIA), and cerebral infarction without residual deficits: Secondary | ICD-10-CM | POA: Diagnosis not present

## 2024-03-01 DIAGNOSIS — E1122 Type 2 diabetes mellitus with diabetic chronic kidney disease: Secondary | ICD-10-CM | POA: Diagnosis not present

## 2024-03-01 DIAGNOSIS — E7849 Other hyperlipidemia: Secondary | ICD-10-CM | POA: Diagnosis not present

## 2024-03-01 DIAGNOSIS — Z0000 Encounter for general adult medical examination without abnormal findings: Secondary | ICD-10-CM | POA: Diagnosis not present

## 2024-03-01 DIAGNOSIS — I10 Essential (primary) hypertension: Secondary | ICD-10-CM | POA: Diagnosis not present

## 2024-03-05 ENCOUNTER — Ambulatory Visit (HOSPITAL_BASED_OUTPATIENT_CLINIC_OR_DEPARTMENT_OTHER): Payer: 59 | Attending: Primary Care | Admitting: Pulmonary Disease

## 2024-03-12 ENCOUNTER — Other Ambulatory Visit: Payer: Self-pay | Admitting: Cardiology

## 2024-03-13 ENCOUNTER — Other Ambulatory Visit: Payer: Self-pay | Admitting: Cardiology

## 2024-03-13 MED ORDER — OMEPRAZOLE 20 MG PO CPDR: 20.0000 mg | DELAYED_RELEASE_CAPSULE | Freq: Every day | ORAL | 1 refills | Status: AC

## 2024-04-06 NOTE — Telephone Encounter (Signed)
 Called pt, received VM but VM is full and not accepting messages

## 2024-04-11 NOTE — Telephone Encounter (Signed)
 Called pt, VM is full and still unable to leave VM. Letter mailed.

## 2024-05-21 ENCOUNTER — Other Ambulatory Visit: Payer: Self-pay | Admitting: Cardiology

## 2024-06-04 DIAGNOSIS — J45909 Unspecified asthma, uncomplicated: Secondary | ICD-10-CM | POA: Diagnosis not present

## 2024-06-04 DIAGNOSIS — E119 Type 2 diabetes mellitus without complications: Secondary | ICD-10-CM | POA: Diagnosis not present

## 2024-06-04 DIAGNOSIS — I252 Old myocardial infarction: Secondary | ICD-10-CM | POA: Diagnosis not present

## 2024-06-04 DIAGNOSIS — R079 Chest pain, unspecified: Secondary | ICD-10-CM | POA: Diagnosis not present

## 2024-06-04 DIAGNOSIS — Z87891 Personal history of nicotine dependence: Secondary | ICD-10-CM | POA: Diagnosis not present

## 2024-06-04 DIAGNOSIS — I10 Essential (primary) hypertension: Secondary | ICD-10-CM | POA: Diagnosis not present

## 2024-06-04 DIAGNOSIS — R410 Disorientation, unspecified: Secondary | ICD-10-CM | POA: Diagnosis not present
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
## 2024-06-14 DIAGNOSIS — M5432 Sciatica, left side: Secondary | ICD-10-CM | POA: Diagnosis not present

## 2024-06-14 DIAGNOSIS — E7849 Other hyperlipidemia: Secondary | ICD-10-CM | POA: Diagnosis not present

## 2024-06-14 DIAGNOSIS — E1122 Type 2 diabetes mellitus with diabetic chronic kidney disease: Secondary | ICD-10-CM | POA: Diagnosis not present

## 2024-06-14 DIAGNOSIS — N1831 Chronic kidney disease, stage 3a: Secondary | ICD-10-CM | POA: Diagnosis not present

## 2024-06-14 DIAGNOSIS — I10 Essential (primary) hypertension: Secondary | ICD-10-CM | POA: Diagnosis not present

## 2024-06-14 DIAGNOSIS — Z8673 Personal history of transient ischemic attack (TIA), and cerebral infarction without residual deficits: Secondary | ICD-10-CM | POA: Diagnosis not present

## 2024-06-14 DIAGNOSIS — M5459 Other low back pain: Secondary | ICD-10-CM | POA: Diagnosis not present

## 2024-06-16 ENCOUNTER — Emergency Department (HOSPITAL_COMMUNITY)

## 2024-06-16 ENCOUNTER — Other Ambulatory Visit: Payer: Self-pay

## 2024-06-16 ENCOUNTER — Encounter (HOSPITAL_COMMUNITY): Payer: Self-pay

## 2024-06-16 ENCOUNTER — Inpatient Hospital Stay (HOSPITAL_COMMUNITY)
Admission: EM | Admit: 2024-06-16 | Discharge: 2024-06-19 | DRG: 101 | Disposition: A | Discharge status: Home Health | Attending: Internal Medicine | Admitting: Internal Medicine

## 2024-06-16 ENCOUNTER — Encounter (HOSPITAL_COMMUNITY)

## 2024-06-16 DIAGNOSIS — I6523 Occlusion and stenosis of bilateral carotid arteries: Secondary | ICD-10-CM | POA: Diagnosis not present

## 2024-06-16 DIAGNOSIS — I639 Cerebral infarction, unspecified: Secondary | ICD-10-CM | POA: Diagnosis not present

## 2024-06-16 DIAGNOSIS — J4489 Other specified chronic obstructive pulmonary disease: Secondary | ICD-10-CM | POA: Diagnosis present

## 2024-06-16 DIAGNOSIS — R9089 Other abnormal findings on diagnostic imaging of central nervous system: Secondary | ICD-10-CM | POA: Diagnosis not present

## 2024-06-16 DIAGNOSIS — F419 Anxiety disorder, unspecified: Secondary | ICD-10-CM | POA: Diagnosis present

## 2024-06-16 DIAGNOSIS — Z79899 Other long term (current) drug therapy: Secondary | ICD-10-CM

## 2024-06-16 DIAGNOSIS — Z7982 Long term (current) use of aspirin: Secondary | ICD-10-CM

## 2024-06-16 DIAGNOSIS — Z7984 Long term (current) use of oral hypoglycemic drugs: Secondary | ICD-10-CM | POA: Diagnosis not present

## 2024-06-16 DIAGNOSIS — Z794 Long term (current) use of insulin: Secondary | ICD-10-CM

## 2024-06-16 DIAGNOSIS — N179 Acute kidney failure, unspecified: Secondary | ICD-10-CM | POA: Diagnosis present

## 2024-06-16 DIAGNOSIS — I959 Hypotension, unspecified: Secondary | ICD-10-CM | POA: Diagnosis present

## 2024-06-16 DIAGNOSIS — I252 Old myocardial infarction: Secondary | ICD-10-CM | POA: Diagnosis not present

## 2024-06-16 DIAGNOSIS — F209 Schizophrenia, unspecified: Secondary | ICD-10-CM | POA: Diagnosis present

## 2024-06-16 DIAGNOSIS — I110 Hypertensive heart disease with heart failure: Secondary | ICD-10-CM | POA: Diagnosis present

## 2024-06-16 DIAGNOSIS — I6521 Occlusion and stenosis of right carotid artery: Secondary | ICD-10-CM | POA: Diagnosis present

## 2024-06-16 DIAGNOSIS — R569 Unspecified convulsions: Secondary | ICD-10-CM | POA: Diagnosis present

## 2024-06-16 DIAGNOSIS — I6503 Occlusion and stenosis of bilateral vertebral arteries: Secondary | ICD-10-CM | POA: Diagnosis not present

## 2024-06-16 DIAGNOSIS — R9082 White matter disease, unspecified: Secondary | ICD-10-CM | POA: Diagnosis not present

## 2024-06-16 DIAGNOSIS — F319 Bipolar disorder, unspecified: Secondary | ICD-10-CM | POA: Diagnosis present

## 2024-06-16 DIAGNOSIS — E876 Hypokalemia: Secondary | ICD-10-CM | POA: Diagnosis present

## 2024-06-16 DIAGNOSIS — K219 Gastro-esophageal reflux disease without esophagitis: Secondary | ICD-10-CM | POA: Diagnosis present

## 2024-06-16 DIAGNOSIS — R471 Dysarthria and anarthria: Secondary | ICD-10-CM | POA: Diagnosis present

## 2024-06-16 DIAGNOSIS — I3139 Other pericardial effusion (noninflammatory): Secondary | ICD-10-CM | POA: Diagnosis present

## 2024-06-16 DIAGNOSIS — I63541 Cerebral infarction due to unspecified occlusion or stenosis of right cerebellar artery: Secondary | ICD-10-CM

## 2024-06-16 DIAGNOSIS — G4089 Other seizures: Secondary | ICD-10-CM | POA: Diagnosis present

## 2024-06-16 DIAGNOSIS — R29818 Other symptoms and signs involving the nervous system: Secondary | ICD-10-CM | POA: Diagnosis not present

## 2024-06-16 DIAGNOSIS — R599 Enlarged lymph nodes, unspecified: Secondary | ICD-10-CM | POA: Diagnosis not present

## 2024-06-16 DIAGNOSIS — I5032 Chronic diastolic (congestive) heart failure: Secondary | ICD-10-CM | POA: Diagnosis present

## 2024-06-16 DIAGNOSIS — Z9641 Presence of insulin pump (external) (internal): Secondary | ICD-10-CM | POA: Diagnosis present

## 2024-06-16 DIAGNOSIS — Z6841 Body Mass Index (BMI) 40.0 and over, adult: Secondary | ICD-10-CM | POA: Diagnosis not present

## 2024-06-16 DIAGNOSIS — Z87891 Personal history of nicotine dependence: Secondary | ICD-10-CM

## 2024-06-16 DIAGNOSIS — I672 Cerebral atherosclerosis: Secondary | ICD-10-CM | POA: Diagnosis not present

## 2024-06-16 DIAGNOSIS — Z743 Need for continuous supervision: Secondary | ICD-10-CM | POA: Diagnosis not present

## 2024-06-16 DIAGNOSIS — E66813 Obesity, class 3: Secondary | ICD-10-CM | POA: Diagnosis present

## 2024-06-16 DIAGNOSIS — E785 Hyperlipidemia, unspecified: Secondary | ICD-10-CM | POA: Diagnosis present

## 2024-06-16 DIAGNOSIS — E11649 Type 2 diabetes mellitus with hypoglycemia without coma: Secondary | ICD-10-CM | POA: Diagnosis not present

## 2024-06-16 DIAGNOSIS — E119 Type 2 diabetes mellitus without complications: Secondary | ICD-10-CM

## 2024-06-16 DIAGNOSIS — I9589 Other hypotension: Secondary | ICD-10-CM | POA: Diagnosis not present

## 2024-06-16 DIAGNOSIS — Z471 Aftercare following joint replacement surgery: Secondary | ICD-10-CM | POA: Diagnosis not present

## 2024-06-16 DIAGNOSIS — Z8249 Family history of ischemic heart disease and other diseases of the circulatory system: Secondary | ICD-10-CM

## 2024-06-16 DIAGNOSIS — R4781 Slurred speech: Secondary | ICD-10-CM | POA: Diagnosis not present

## 2024-06-16 DIAGNOSIS — Z90710 Acquired absence of both cervix and uterus: Secondary | ICD-10-CM

## 2024-06-16 DIAGNOSIS — R29712 NIHSS score 12: Secondary | ICD-10-CM | POA: Diagnosis not present

## 2024-06-16 DIAGNOSIS — R6889 Other general symptoms and signs: Secondary | ICD-10-CM | POA: Diagnosis not present

## 2024-06-16 DIAGNOSIS — R531 Weakness: Secondary | ICD-10-CM | POA: Diagnosis not present

## 2024-06-16 DIAGNOSIS — I10 Essential (primary) hypertension: Secondary | ICD-10-CM | POA: Diagnosis not present

## 2024-06-16 DIAGNOSIS — K5900 Constipation, unspecified: Secondary | ICD-10-CM | POA: Diagnosis not present

## 2024-06-16 DIAGNOSIS — I69354 Hemiplegia and hemiparesis following cerebral infarction affecting left non-dominant side: Secondary | ICD-10-CM

## 2024-06-16 DIAGNOSIS — E278 Other specified disorders of adrenal gland: Secondary | ICD-10-CM | POA: Diagnosis not present

## 2024-06-16 LAB — I-STAT CHEM 8, ED
BUN: 23 mg/dL (ref 8–23)
Calcium, Ion: 1.09 mmol/L — ABNORMAL LOW (ref 1.15–1.40)
Chloride: 104 mmol/L (ref 98–111)
Creatinine, Ser: 1.4 mg/dL — ABNORMAL HIGH (ref 0.44–1.00)
Glucose, Bld: 126 mg/dL — ABNORMAL HIGH (ref 70–99)
HCT: 37 % (ref 36.0–46.0)
Hemoglobin: 12.6 g/dL (ref 12.0–15.0)
Potassium: 3 mmol/L — ABNORMAL LOW (ref 3.5–5.1)
Sodium: 140 mmol/L (ref 135–145)
TCO2: 22 mmol/L (ref 22–32)

## 2024-06-16 LAB — CBG MONITORING, ED: Glucose-Capillary: 130 mg/dL — ABNORMAL HIGH (ref 70–99)

## 2024-06-16 LAB — DIFFERENTIAL
Abs Immature Granulocytes: 0.03 K/uL (ref 0.00–0.07)
Basophils Absolute: 0.1 K/uL (ref 0.0–0.1)
Basophils Relative: 1 %
Eosinophils Absolute: 0 K/uL (ref 0.0–0.5)
Eosinophils Relative: 1 %
Immature Granulocytes: 0 %
Lymphocytes Relative: 52 %
Lymphs Abs: 4.3 K/uL — ABNORMAL HIGH (ref 0.7–4.0)
Monocytes Absolute: 0.7 K/uL (ref 0.1–1.0)
Monocytes Relative: 9 %
Neutro Abs: 3 K/uL (ref 1.7–7.7)
Neutrophils Relative %: 37 %

## 2024-06-16 LAB — PROTIME-INR
INR: 0.9 (ref 0.8–1.2)
Prothrombin Time: 13.1 s (ref 11.4–15.2)

## 2024-06-16 LAB — COMPREHENSIVE METABOLIC PANEL WITH GFR
ALT: 23 U/L (ref 0–44)
AST: 31 U/L (ref 15–41)
Albumin: 2.9 g/dL — ABNORMAL LOW (ref 3.5–5.0)
Alkaline Phosphatase: 50 U/L (ref 38–126)
Anion gap: 12 (ref 5–15)
BUN: 21 mg/dL (ref 8–23)
CO2: 21 mmol/L — ABNORMAL LOW (ref 22–32)
Calcium: 8.5 mg/dL — ABNORMAL LOW (ref 8.9–10.3)
Chloride: 104 mmol/L (ref 98–111)
Creatinine, Ser: 1.34 mg/dL — ABNORMAL HIGH (ref 0.44–1.00)
GFR, Estimated: 45 mL/min — ABNORMAL LOW (ref >60)
Glucose, Bld: 132 mg/dL — ABNORMAL HIGH (ref 70–99)
Potassium: 3 mmol/L — ABNORMAL LOW (ref 3.5–5.1)
Sodium: 137 mmol/L (ref 135–145)
Total Bilirubin: 1.1 mg/dL (ref 0.0–1.2)
Total Protein: 6.5 g/dL (ref 6.5–8.1)

## 2024-06-16 LAB — URINALYSIS, ROUTINE W REFLEX MICROSCOPIC
Bacteria, UA: NONE SEEN
Bilirubin Urine: NEGATIVE
Glucose, UA: >=500 mg/dL — AB
Hgb urine dipstick: NEGATIVE
Ketones, ur: NEGATIVE mg/dL
Leukocytes,Ua: NEGATIVE
Nitrite: NEGATIVE
Protein, ur: NEGATIVE mg/dL
Specific Gravity, Urine: 1.033 — ABNORMAL HIGH (ref 1.005–1.030)
pH: 5 (ref 5.0–8.0)

## 2024-06-16 LAB — CBC
HCT: 38 % (ref 36.0–46.0)
Hemoglobin: 12.4 g/dL (ref 12.0–15.0)
MCH: 31.2 pg (ref 26.0–34.0)
MCHC: 32.6 g/dL (ref 30.0–36.0)
MCV: 95.5 fL (ref 80.0–100.0)
Platelets: 175 K/uL (ref 150–400)
RBC: 3.98 MIL/uL (ref 3.87–5.11)
RDW: 12.9 % (ref 11.5–15.5)
WBC: 8.2 K/uL (ref 4.0–10.5)
nRBC: 0 % (ref 0.0–0.2)

## 2024-06-16 LAB — GLUCOSE, CAPILLARY
Glucose-Capillary: 66 mg/dL — ABNORMAL LOW (ref 70–99)
Glucose-Capillary: 130 mg/dL — ABNORMAL HIGH (ref 70–99)

## 2024-06-16 LAB — HIV ANTIBODY (ROUTINE TESTING W REFLEX): HIV Screen 4th Generation wRfx: NONREACTIVE

## 2024-06-16 LAB — APTT: aPTT: 28 s (ref 24–36)

## 2024-06-16 LAB — HEMOGLOBIN A1C
Hgb A1c MFr Bld: 6.6 % — ABNORMAL HIGH (ref 4.8–5.6)
Mean Plasma Glucose: 142.72 mg/dL

## 2024-06-16 LAB — ETHANOL: Alcohol, Ethyl (B): <15 mg/dL (ref <15)

## 2024-06-16 MED ORDER — LORAZEPAM 2 MG/ML IJ SOLN
INTRAMUSCULAR | Status: AC
  Filled 2024-06-16: qty 1

## 2024-06-16 MED ORDER — SODIUM CHLORIDE 0.9 % IV SOLN: Freq: Once | INTRAVENOUS | Status: AC

## 2024-06-16 MED ORDER — LEVETIRACETAM (KEPPRA) 500 MG/5 ML ADULT IV PUSH
500.0000 mg | Freq: Two times a day (BID) | INTRAVENOUS | Status: DC
  Administered 2024-06-17: 500 mg via INTRAVENOUS
  Filled 2024-06-16: qty 5

## 2024-06-16 MED ORDER — SODIUM CHLORIDE 0.9% FLUSH
3.0000 mL | Freq: Once | INTRAVENOUS | Status: AC
  Administered 2024-06-16: 3 mL via INTRAVENOUS

## 2024-06-16 MED ORDER — ACETAMINOPHEN 160 MG/5ML PO SOLN: 650.0000 mg | Freq: Four times a day (QID) | ORAL | Status: DC | PRN

## 2024-06-16 MED ORDER — ASPIRIN 300 MG RE SUPP
300.0000 mg | Freq: Once | RECTAL | Status: AC
  Administered 2024-06-16: 300 mg via RECTAL
  Filled 2024-06-16: qty 1

## 2024-06-16 MED ORDER — POTASSIUM CHLORIDE 10 MEQ/100ML IV SOLN
10.0000 meq | INTRAVENOUS | Status: AC
  Administered 2024-06-16 – 2024-06-17 (×4): 10 meq via INTRAVENOUS
  Filled 2024-06-16 (×4): qty 100

## 2024-06-16 MED ORDER — LEVETIRACETAM (KEPPRA) 500 MG/5 ML ADULT IV PUSH
2000.0000 mg | Freq: Once | INTRAVENOUS | Status: AC
  Administered 2024-06-16: 2000 mg via INTRAVENOUS
  Filled 2024-06-16: qty 20

## 2024-06-16 MED ORDER — IOHEXOL 350 MG/ML SOLN
80.0000 mL | Freq: Once | INTRAVENOUS | Status: AC | PRN
  Administered 2024-06-16: 80 mL via INTRAVENOUS

## 2024-06-16 MED ORDER — DEXTROSE 50 % IV SOLN
12.5000 g | INTRAVENOUS | Status: AC
  Administered 2024-06-16: 12.5 g via INTRAVENOUS
  Filled 2024-06-16: qty 50

## 2024-06-16 MED ORDER — ACETAMINOPHEN 325 MG PO TABS: 650.0000 mg | ORAL_TABLET | Freq: Four times a day (QID) | ORAL | Status: DC | PRN

## 2024-06-16 MED ORDER — INSULIN ASPART 100 UNIT/ML IJ SOLN
0.0000 [IU] | INTRAMUSCULAR | Status: DC
  Administered 2024-06-18: 2 [IU] via SUBCUTANEOUS
  Administered 2024-06-18: 1 [IU] via SUBCUTANEOUS
  Administered 2024-06-19: 2 [IU] via SUBCUTANEOUS
  Administered 2024-06-19: 1 [IU] via SUBCUTANEOUS

## 2024-06-16 MED ORDER — SODIUM CHLORIDE 0.9 % IV BOLUS
1000.0000 mL | Freq: Once | INTRAVENOUS | Status: AC
  Administered 2024-06-16: 1000 mL via INTRAVENOUS

## 2024-06-16 MED ORDER — ENOXAPARIN SODIUM 40 MG/0.4ML IJ SOSY
40.0000 mg | PREFILLED_SYRINGE | INTRAMUSCULAR | Status: DC
  Administered 2024-06-16 – 2024-06-18 (×3): 40 mg via SUBCUTANEOUS
  Filled 2024-06-16 (×3): qty 0.4

## 2024-06-16 MED ORDER — LORAZEPAM 2 MG/ML IJ SOLN
2.0000 mg | Freq: Once | INTRAMUSCULAR | Status: AC
  Administered 2024-06-16: 2 mg via INTRAVENOUS

## 2024-06-16 MED ORDER — ACETAMINOPHEN 650 MG RE SUPP
650.0000 mg | Freq: Four times a day (QID) | RECTAL | Status: DC | PRN
Start: 2024-06-16 — End: 2024-06-19

## 2024-06-16 MED ORDER — STROKE: EARLY STAGES OF RECOVERY BOOK
Freq: Once | Status: AC
  Filled 2024-06-16: qty 1

## 2024-06-16 MED ORDER — SODIUM CHLORIDE 0.9 % IV SOLN: INTRAVENOUS | Status: AC

## 2024-06-16 MED ORDER — IOHEXOL 350 MG/ML SOLN
80.0000 mL | Freq: Once | INTRAVENOUS | Status: AC | PRN
  Administered 2024-06-16: 75 mL via INTRAVENOUS

## 2024-06-16 NOTE — H&P (Signed)
 History and Physical    Amber Harris FMW:[id number] DOB: January 03, 1961 DOA: 06/16/2024  PCP: Orpha Yancey LABOR, MD  Patient coming from: Home  Chief Complaint: Slurred speech, left-sided weakness  HPI: Amber Harris is a 63 y.o. female with medical history significant of chronic HFpEF, carotid stenosis, CVA with residual left-sided weakness, COPD, depression/anxiety, insulin -dependent type 2 diabetes, GERD, hypertension, hyperlipidemia, class III obesity presented to the ED after being found to be staring off into space and unresponsive at a family gathering.  EMS noted severe dysarthria, abnormal mouth movements, intermittent left gaze deviation, and left-sided weakness.  She was noted to be hypotensive with SBP in the 80s with EMS and was given 1 L IV fluids. Patient was given Ativan  2 mg in the ED with resultant cessation of abnormal mouth movements.  She was again noted to be hypotensive with SBP in the 70s in the ED and was given additional 1 L IV fluids after which blood pressure improved.  Labs showing no leukocytosis or anemia, potassium 3.0, bicarb 21, glucose 132, creatinine 1.3 (baseline 0.8-1.0), calcium  8.5, albumin 2.9, ethanol level <15, UA pending.  EKG showing sinus rhythm and no STEMI.   CT head showing a small infarct within the right cerebellar hemisphere, new from prior brain MRI done in March 2022 but otherwise age-indeterminate.  No acute intracranial hemorrhage or acute demarcated cortical infarct.   CTA head and neck showing: IMPRESSION: CTA neck:   1. The common carotid and internal carotid arteries are patent in the neck without stenosis. Mild atherosclerotic plaque bilaterally. 2. Vertebral arteries patent within the neck. Atherosclerotic plaque at both vertebral artery origins (mild stenosis on the right, suspected moderate stenosis on the left). 3. 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 4. Cervical  spondylosis. 5. Aortic Atherosclerosis (ICD10-I70.0).   CTA head:   1. Focal occlusion versus severe near-occlusive stenosis of the proximal right posterior inferior cerebellar artery (PICA), new from the prior CTA of 02/15/2021. 2. Background intracranial atherosclerotic disease as described within the body of the report. 3. At least moderate stenosis of the cavernous right internal carotid artery, unchanged. 4. New severe stenosis within a left middle cerebral artery proximal M2 branch. 5. Unchanged moderate stenosis within a left posterior cerebral artery branch at the P2/P3 junction.   CTA chest/abdomen/pelvis showing: IMPRESSION: 1. No aortic dissection or acute aortic abnormality. 2. Cardiomegaly with small pericardial effusion. 3. Prominent main pulmonary artery, can be seen with pulmonary arterial hypertension. 4. Mild bladder wall thickening, recommend correlation with urinalysis. 5. Moderate to large volume of stool in the colon, can be seen with constipation.   Aortic Atherosclerosis (ICD10-I70.0).   Neurology felt that the patient's presentation was concerning for seizure versus ?acute stroke.  TNK was not administered because timing of symptom onset was unclear.  She was given loading dose of Keppra  2000 mg and started on Keppra  500 mg every 12 hours.  Neurology ordered overnight EEG monitoring and recommended brain MRI to rule out stroke.  Patient is currently somnolent after receiving Ativan  in the ED.  She is not able to give any history.  No family available at this time.  Review of Systems:  Review of Systems  Reason unable to perform ROS: AMS.    Past Medical History:  Diagnosis Date   Aortic valve disease    Long-standing systolic murmur   Asthma    Uses p.r.n. albuterol    Bronchitis    COPD (chronic  obstructive pulmonary disease) (HCC)    Degenerative joint disease    right knee   Depression    Depression with anxiety    Diabetes mellitus     Dyspnea    exertion   Dyspnea on exertion    poor exercise tolerance   Fibroids    uterine; postmenopausal bleeding   GERD (gastroesophageal reflux disease)    Headache(784.0)    Hyperlipidemia    Hypertension    Myocardial infarction (HCC)    Obesity    Palpitations    Stroke (HCC)    TIA - left side residual   Syncope    exertional   Tobacco user    30 pack years; 04/2011: 1/4 pack per day during quick attempt    Past Surgical History:  Procedure Laterality Date   ABDOMINAL HYSTERECTOMY  12/21/2011   Procedure: HYSTERECTOMY ABDOMINAL;  Surgeon: Norleen LULLA Server, MD;  Location: [facility];  Service: Gynecology;  Laterality: N/A;   CATARACT EXTRACTION W/PHACO Left 10/03/2020   Procedure: CATARACT EXTRACTION PHACO AND INTRAOCULAR LENS PLACEMENT LEFT EYE;  Surgeon: Harrie Agent, MD;  Location: [facility];  Service: Ophthalmology;  Laterality: Left;  CDE 5.04   CATARACT EXTRACTION W/PHACO Right 10/17/2020   Procedure: CATARACT EXTRACTION PHACO AND INTRAOCULAR LENS PLACEMENT RIGHT EYE;  Surgeon: Harrie Agent, MD;  Location: [facility];  Service: Ophthalmology;  Laterality: Right;  CDE: 4.60   COLONOSCOPY WITH PROPOFOL  N/A 02/14/2024   Procedure: COLONOSCOPY WITH PROPOFOL ;  Surgeon: Cinderella Deatrice FALCON, MD;  Location: [facility];  Service: Endoscopy;  Laterality: N/A;  9:45AM;ASA 3   LEFT HEART CATH AND CORONARY ANGIOGRAPHY N/A 02/17/2021   Procedure: LEFT HEART CATH AND CORONARY ANGIOGRAPHY;  Surgeon: Wonda Sharper, MD;  Location: [facility] INVASIVE CV LAB;  Service: Cardiovascular;  Laterality: N/A;   POLYPECTOMY  02/14/2024   Procedure: POLYPECTOMY;  Surgeon: Cinderella Deatrice FALCON, MD;  Location: [facility];  Service: Endoscopy;;   SUBMUCOSAL INJECTION  02/14/2024   Procedure: INJECTION, SUBMUCOSAL;  Surgeon: Cinderella Deatrice FALCON, MD;  Location: [facility];  Service: Endoscopy;;   TUBAL LIGATION       reports that she quit smoking about 13 months ago. Her smoking use included cigarettes. She started  smoking about 49 years ago. She has a 24.3 pack-year smoking history. She has never used smokeless tobacco. She reports current drug use. Drug: Marijuana. She reports that she does not drink alcohol.  Allergies  Allergen Reactions   Aspirin  Itching    States that she takes aspirin  regularly without significant reaction.   Penicillins Rash    Family History  Problem Relation Age of Onset   Lung cancer Mother    Heart disease Brother    Breast cancer Sister    Heart disease Maternal Aunt    Cancer Cousin        lung   Anesthesia problems Neg Hx    Hypotension Neg Hx    Malignant hyperthermia Neg Hx    Pseudochol deficiency Neg Hx     Prior to Admission medications   Medication Sig Start Date End Date Taking? Authorizing Provider  albuterol  (PROVENTIL ,VENTOLIN ) 90 MCG/ACT inhaler Inhale 2 puffs into the lungs every 6 (six) hours as needed for wheezing or shortness of breath.    [provider]  aspirin  EC 81 MG tablet Take 81 mg by mouth daily.    [provider]  chlorthalidone  (HYGROTON ) 50 MG tablet Take 50 mg by mouth daily.    [provider]  citalopram (CELEXA) 20 MG tablet Take 20 mg by mouth every morning. 02/11/23   [provider]  clopidogrel  (PLAVIX ) 75 MG tablet Take 75 mg by mouth every morning. 02/11/23   [provider]  Continuous Glucose Sensor (DEXCOM G7 SENSOR) MISC CHANGE EVERY 10 DAYS    [provider]  ezetimibe  (ZETIA ) 10 MG tablet TAKE 1 TABLET BY MOUTH ONCE DAILY 11/24/23   Alvan Dorn FALCON, MD  gabapentin  (NEURONTIN ) 300 MG capsule Take 300 mg by mouth 3 (three) times daily. 02/14/23   [provider]  hydrALAZINE  (APRESOLINE ) 50 MG tablet TAKE 1 TABLET BY MOUTH TWICE DAILY 05/24/24   Alvan Dorn FALCON, MD  Insulin  Disposable Pump (OMNIPOD DASH PODS, GEN 4,) MISC Change every 48 hours 10/22/23   [provider]  insulin  lispro (HUMALOG) 100 UNIT/ML injection USE IN OMNIPOD. MAX 100  UNITS PER DAY *DISCONTINUE NOVOLOG *    [provider]  JARDIANCE  25 MG TABS tablet Take 25 mg by mouth daily.  03/24/16   [provider]  methocarbamol  (ROBAXIN ) 500 MG tablet Take 500 mg by mouth at bedtime. 01/14/21   [provider]  omeprazole  (PRILOSEC) 20 MG capsule Take 1 capsule (20 mg total) by mouth daily. 03/13/24   Alvan Dorn FALCON, MD  risperiDONE  (RISPERDAL ) 0.5 MG tablet Take 0.5 mg by mouth at bedtime.    [provider]  rosuvastatin  (CRESTOR ) 40 MG tablet TAKE 1 TABLET BY MOUTH EVERY DAY AT BEDTIME 08/12/23   Alvan Dorn FALCON, MD    Physical Exam: Vitals:   06/16/24 1815 06/16/24 1832 06/16/24 1845 06/16/24 1900  BP: (!) 80/51 (!) 100/52 (!) 97/52 (!) 109/49  Pulse: 65 61 63 63  Resp: 15 13 14 16   Temp:      TempSrc:      SpO2: 98% 100% 100% 98%  Weight:        Physical Exam Vitals reviewed.  Constitutional:      General: She is not in acute distress. HENT:     Head: Normocephalic and atraumatic.  Cardiovascular:     Rate and Rhythm: Normal rate and regular rhythm.     Pulses: Normal pulses.  Pulmonary:     Effort: Pulmonary effort is normal. No respiratory distress.     Breath sounds: Normal breath sounds. No wheezing, rhonchi or rales.  Abdominal:     General: Bowel sounds are normal. There is no distension.     Palpations: Abdomen is soft.     Tenderness: There is no abdominal tenderness. There is no guarding.  Musculoskeletal:     Cervical back: Normal range of motion.     Right lower leg: No edema.     Left lower leg: No edema.  Skin:    General: Skin is warm and dry.  Neurological:     Comments: Somnolent but arousable Moving all extremities spontaneously     Labs on Admission: I have personally reviewed following labs and imaging studies  CBC: Recent Labs  Lab 06/16/24 1638 06/16/24 1647  WBC 8.2  --   NEUTROABS 3.0  --   HGB 12.4 12.6  HCT 38.0 37.0  MCV 95.5  --   PLT 175  --    Basic  Metabolic Panel: Recent Labs  Lab 06/16/24 1638 06/16/24 1647  NA 137 140  K 3.0* 3.0*  CL 104 104  CO2 21*  --   GLUCOSE 132* 126*  BUN 21 23  CREATININE 1.34* 1.40*  CALCIUM  8.5*  --  GFR: Estimated Creatinine Clearance: 59.2 mL/min (A) (by C-G formula based on SCr of 1.4 mg/dL (H)). Liver Function Tests: Recent Labs  Lab 06/16/24 1638  AST 31  ALT 23  ALKPHOS 50  BILITOT 1.1  PROT 6.5  ALBUMIN 2.9*   No results for input(s): LIPASE, AMYLASE in the last 168 hours. No results for input(s): AMMONIA in the last 168 hours. Coagulation Profile: Recent Labs  Lab 06/16/24 1638  INR 0.9   Cardiac Enzymes: No results for input(s): CKTOTAL, CKMB, CKMBINDEX, TROPONINI in the last 168 hours. BNP (last 3 results) No results for input(s): PROBNP in the last 8760 hours. HbA1C: No results for input(s): HGBA1C in the last 72 hours. CBG: Recent Labs  Lab 06/16/24 1639  GLUCAP 130*   Lipid Profile: No results for input(s): CHOL, HDL, LDLCALC, TRIG, CHOLHDL, LDLDIRECT in the last 72 hours. Thyroid  Function Tests: No results for input(s): TSH, T4TOTAL, FREET4, T3FREE, THYROIDAB in the last 72 hours. Anemia Panel: No results for input(s): VITAMINB12, FOLATE, FERRITIN, TIBC, IRON, RETICCTPCT in the last 72 hours. Urine analysis:    Component Value Date/Time   COLORURINE YELLOW 08/24/2012 1925   APPEARANCEUR CLEAR 08/24/2012 1925   LABSPEC >1.030 (H) 08/24/2012 1925   PHURINE 5.5 08/24/2012 1925   GLUCOSEU NEGATIVE 08/24/2012 1925   HGBUR NEGATIVE 08/24/2012 1925   BILIRUBINUR NEGATIVE 08/24/2012 1925   KETONESUR NEGATIVE 08/24/2012 1925   PROTEINUR neg 10/17/2013 0936   PROTEINUR NEGATIVE 08/24/2012 1925   UROBILINOGEN 0.2 08/24/2012 1925   NITRITE neg 10/17/2013 0936   NITRITE NEGATIVE 08/24/2012 1925   LEUKOCYTESUR Negative 10/17/2013 0936    Radiological Exams on Admission: CT ANGIO HEAD NECK W WO CM W PERF  (CODE STROKE) Addendum Date: 06/16/2024 ADDENDUM REPORT: 06/16/2024 18:38 ADDENDUM: CTA head impressions #1 and #4 called by telephone on 06/16/2024 at 6:08 pm to provider [facility] , who verbally acknowledged these results. Negative perfusion results were also discussed at this time. Electronically Signed   By: Rockey Childs D.O.   On: 06/16/2024 18:38   Result Date: 06/16/2024 CLINICAL DATA:  Provided history: Neuro deficit, acute, stroke suspected. EXAM: CT ANGIOGRAPHY HEAD AND NECK CT PERFUSION BRAIN TECHNIQUE: Multidetector CT imaging of the head and neck was performed using the standard protocol during bolus administration of intravenous contrast. Multiplanar CT image reconstructions and MIPs were obtained to evaluate the vascular anatomy. Carotid stenosis measurements (when applicable) are obtained utilizing NASCET criteria, using the distal internal carotid diameter as the denominator. Multiphase CT imaging of the brain was performed following IV bolus contrast injection. Subsequent parametric perfusion maps were calculated using RAPID software. RADIATION DOSE REDUCTION: This exam was performed according to the departmental dose-optimization program which includes automated exposure control, adjustment of the mA and/or kV according to patient size and/or use of iterative reconstruction technique. CONTRAST:  75mL OMNIPAQUE  IOHEXOL  350 MG/ML SOLN COMPARISON:  Non-contrast head CT performed earlier today 06/16/2024. CT angiogram head/neck and CT perfusion head 02/15/2021. FINDINGS: CTA NECK FINDINGS Aortic arch: Standard aortic branching. Atherosclerotic plaque within the visualized aortic arch and proximal major branch vessels of the neck. No hemodynamically significant innominate or proximal subclavian artery stenosis. Right carotid system: CCA and ICA patent within the neck without stenosis. Mild atherosclerotic plaque scattered within the CCA and about the carotid bifurcation. Left carotid system: CCA  and ICA patent within the neck without stenosis. Mild sclerotic plaque about the carotid bifurcation. Vertebral arteries: Codominant and patent within the neck. Atherosclerotic plaque at the right vertebral artery  origin resulting in mild stenosis. Atherosclerotic plaque at the left vertebral artery origin resulting in a suspected moderate stenosis Skeleton: Cervical spondylosis. Multilevel cervical ventrolateral osteophytes, some bridging. No acute fracture or aggressive osseous lesion. Other neck: 6.0 x 3.7 cm lipoma within the left neck (superficial to the left sternocleidomastoid muscle). Upper chest: No consolidation within the imaged lung apices. Review of the MIP images confirms the above findings CTA HEAD FINDINGS Anterior circulation: The intracranial internal carotid arteries are patent. Atherosclerotic plaque within both vessels. Calcified plaque results in at least moderate stenosis of the cavernous segment on the right. No more than mild stenosis elsewhere within the intracranial ICAs. The M1 middle cerebral arteries are patent. Atherosclerotic irregularity of the M2 and more distal MCA vessels bilaterally. Most notably, there is a new severe stenosis within a superior division proximal left MCA M2 branch (series 8, image 26). The anterior cerebral arteries are patent. No intracranial aneurysm is identified. Posterior circulation: The intracranial vertebral arteries are patent. New focal occlusion versus severe near-occlusive stenosis of the proximal right PICA. The basilar artery is patent. The posterior cerebral arteries are patent. Atherosclerotic irregularity of both vessels. Most notably, there is an unchanged moderate stenosis within a left PCA branch at the P2/P3 junction. A small left posterior communicating artery is present. The right posterior communicating artery is diminutive or absent Venous sinuses: Within the limitations of contrast timing, no convincing thrombus. Anatomic variants: As  described. Review of the MIP images confirms the above findings CT Brain Perfusion Findings: CBF (<30%) Volume: 0mL Perfusion (Tmax>6.0s) volume: 0mL Mismatch Volume: 0mL Infarction Location:None identified. Attempts are being made to reach the ordering provider at this time. IMPRESSION: CTA neck: 1. The common carotid and internal carotid arteries are patent in the neck without stenosis. Mild atherosclerotic plaque bilaterally. 2. Vertebral arteries patent within the neck. Atherosclerotic plaque at both vertebral artery origins (mild stenosis on the right, suspected moderate stenosis on the left). 3. 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 4. Cervical spondylosis. 5. Aortic Atherosclerosis (ICD10-I70.0). CTA head: 1. Focal occlusion versus severe near-occlusive stenosis of the proximal right posterior inferior cerebellar artery (PICA), new from the prior CTA of 02/15/2021. 2. Background intracranial atherosclerotic disease as described within the body of the report. 3. At least moderate stenosis of the cavernous right internal carotid artery, unchanged. 4. New severe stenosis within a left middle cerebral artery proximal M2 branch. 5. Unchanged moderate stenosis within a left posterior cerebral artery branch at the P2/P3 junction. Electronically Signed: By: Rockey Childs D.O. On: 06/16/2024 17:54   CT Angio Chest/Abd/Pel for Dissection W and/or Wo Contrast Result Date: 06/16/2024 CLINICAL DATA:  Provided history: hypotension, neuro deficits EXAM: CT ANGIOGRAPHY CHEST, ABDOMEN AND PELVIS TECHNIQUE: Non-contrast CT of the chest was initially obtained. Multidetector CT imaging through the chest, abdomen and pelvis was performed using the standard protocol during bolus administration of intravenous contrast. Multiplanar reconstructed images and MIPs were obtained and reviewed to evaluate the vascular anatomy. RADIATION DOSE REDUCTION: This exam was performed  according to the departmental dose-optimization program which includes automated exposure control, adjustment of the mA and/or kV according to patient size and/or use of iterative reconstruction technique. CONTRAST:  80mL OMNIPAQUE  IOHEXOL  350 MG/ML SOLN COMPARISON:  Report from chest CT 12/27/2021, abdominal CT 09/03/2021. Images not available. FINDINGS: CTA CHEST FINDINGS Cardiovascular: No aortic hematoma on unenhanced exam. The left superior intercostal vein courses over the aortic arch. Mild aortic atherosclerosis. No  aneurysm, dissection or acute aortic finding. Prominent main pulmonary artery at 3.2 cm. There is no central pulmonary embolus to the segmental level. The heart is enlarged. Small pericardial effusion. Mediastinum/Nodes: No mediastinal or hilar adenopathy. No enlarged axillary lymph nodes. Patulous esophagus. Lungs/Pleura: Dependent hypoventilatory changes. Breathing motion artifact limits detailed assessment. No pleural fluid. No features of pulmonary edema. Trachea and central airways are clear. Musculoskeletal: Diffuse thoracic spondylosis with anterior spurring. There are no acute or suspicious osseous abnormalities. Review of the MIP images confirms the above findings. CTA ABDOMEN AND PELVIS FINDINGS VASCULAR Aorta: Normal caliber aorta without aneurysm, dissection, vasculitis or significant stenosis. Moderate calcified atheromatous plaque. Celiac: Patent without evidence of aneurysm, dissection, vasculitis or significant stenosis. SMA: Patent without evidence of aneurysm, dissection, vasculitis or significant stenosis. Replaced right hepatic artery arises from the SMA. Mixed calcified and noncalcified atheromatous plaque. Renals: Plaque at the origin of both renal arteries. Distal renal arteries are patent. No severe stenosis, evidence of vasculitis or FMD. IMA: Small in caliber but patent. Inflow: Patent without evidence of aneurysm, dissection, vasculitis or significant stenosis. Moderate  atheromatous plaque. Veins: No obvious venous abnormality within the limitations of this arterial phase study. Review of the MIP images confirms the above findings. NON-VASCULAR Hepatobiliary: The liver is enlarged spanning 19.2 cm cranial caudal. No evidence of focal liver lesion on this arterial phase exam. Gallbladder physiologically distended, no calcified stone. No biliary dilatation. Pancreas: No ductal dilatation or inflammation. Spleen: Normal in size and arterial enhancement. Adrenals/Urinary Tract: 12 mm left adrenal nodule was described on prior exam, needing no further imaging follow-up. Excretion of IV contrast in both renal collecting systems from neck CTA earlier today. No hydronephrosis or evidence of renal inflammation. Mild bladder wall thickening. Stomach/Bowel: No bowel obstruction or inflammation. Normal appendix. Moderate to large volume of stool in the colon. Lymphatic: Enlarged lymph nodes in the abdomen or pelvis. Reproductive: Status post hysterectomy. No adnexal masses. Other: No free air or ascites. Postsurgical change of the lower anterior abdominal wall. Musculoskeletal: Lower lumbar facet hypertrophy. Bilateral hip arthropathy. There are no acute or suspicious osseous abnormalities. Review of the MIP images confirms the above findings. IMPRESSION: 1. No aortic dissection or acute aortic abnormality. 2. Cardiomegaly with small pericardial effusion. 3. Prominent main pulmonary artery, can be seen with pulmonary arterial hypertension. 4. Mild bladder wall thickening, recommend correlation with urinalysis. 5. Moderate to large volume of stool in the colon, can be seen with constipation. Aortic Atherosclerosis (ICD10-I70.0). Electronically Signed   By: Andrea Gasman M.D.   On: 06/16/2024 18:17   CT HEAD CODE STROKE WO CONTRAST Result Date: 06/16/2024 CLINICAL DATA:  Code stroke. Neuro deficit, acute, stroke suspected. EXAM: CT HEAD WITHOUT CONTRAST TECHNIQUE: Contiguous axial images  were obtained from the base of the skull through the vertex without intravenous contrast. RADIATION DOSE REDUCTION: This exam was performed according to the departmental dose-optimization program which includes automated exposure control, adjustment of the mA and/or kV according to patient size and/or use of iterative reconstruction technique. COMPARISON:  Brain MRI 02/16/2021. FINDINGS: Brain: Mild generalized cerebral atrophy. The cerebral white matter disease described on the prior brain MRI of 02/16/2021 is occult by CT. Small infarct within the right cerebellar hemisphere, new from the prior brain MRI of 02/16/2021 but otherwise age-indeterminate. Partially empty sella turcica. There is no acute intracranial hemorrhage. No demarcated cortical infarct. No extra-axial fluid collection. No evidence of an intracranial mass. No midline shift. Vascular: No hyperdense vessel. Atherosclerotic calcifications. Skull: No calvarial  fracture or aggressive osseous lesion. Sinuses/Orbits: No mass or acute finding within the imaged orbits. No significant paranasal sinus disease at the imaged levels. ASPECTS [facility] Stroke Program Early CT Score) - Ganglionic level infarction (caudate, lentiform nuclei, internal capsule, insula, M1-M3 cortex): 7 - Supraganglionic infarction (M4-M6 cortex): 3 Total score (0-10 with 10 being normal): 10 Impression #1 communicated to Dr. Matthews at 5:08 pmon 7/19/2025by text page via the [facility] messaging system. IMPRESSION: 1. Small infarct within the right cerebellar hemisphere, new from the prior brain MRI of 02/16/2021 but otherwise age-indeterminate. Consider a brain MRI for further evaluation. 2. No acute intracranial hemorrhage or acute demarcated cortical infarct. Electronically Signed   By: Rockey Childs D.O.   On: 06/16/2024 17:10    Assessment and Plan  Seizure R cerebellar PICA infarct of unclear chronicity -Neurology consulting, appreciate recommendations -Patient was given  loading dose IV Keppra  2000 mg in the ED.  Continue IV Keppra  500 mg every 12 hours. -Overnight EEG monitoring -Seizure precautions -Permissive hypertension x 48 hours from symptom onset or until stroke ruled out by MRI.  Goal blood pressure <220/110.  Avoid hypotension, maintain SBP >130 secondary to known intracranial occlusions. -MRI brain without contrast -TTE with bubble -A1c, lipid panel -Neurology recommending aspirin  300 mg PR now, further guidance will be provided tomorrow -N.p.o. until RN dysphagia screen is passed and documented -PT/OT/SLP -Neurochecks every 4 hours -Telemetry monitoring -Stat CT head for any change in neurologic exam  Hypotension No fever, leukocytosis, or signs of sepsis.  EKG showing sinus rhythm and no STEMI.  CTA chest/abdomen/pelvis showing no aortic dissection and no obvious infectious source.  UA pending to rule out UTI.  Continue fluid hydration and monitor blood pressure closely.  Hold home antihypertensives.  Mild hypokalemia Monitor potassium and magnesium levels, continue to replace as needed.  AKI Likely prerenal in etiology in the setting of hypotension.  Creatinine 1.3 (baseline 0.8-1.0).  Continue IV fluid hydration and monitor renal function.  Avoid nephrotoxic agents/hold home chlorthalidone .  Small pericardial effusion Seen on CT.  Echocardiogram ordered and check TSH.   Chronic HFpEF Last echo done in March 2022 showing EF 60 to 65%, trivial mitral regurgitation, mild aortic regurgitation.  No signs of volume overload.  Check BNP.  Insulin -dependent type 2 diabetes Poorly controlled - last A1c 11.1 in December 2024, repeat ordered.  Placed on sensitive sliding scale insulin  every 4 hours for now as patient is somnolent/NPO.  Pharmacy med rec pending.  COPD: Stable, no signs of acute exacerbation. GERD Hyperlipidemia Depression/anxiety Pharmacy med rec pending.  DVT prophylaxis: Lovenox  Code Status: Full code by default.  Patient  currently does not have capacity for decision-making, no surrogate or prior directive available. Family Communication: No family available at this time. Consults called: Neurology Level of care: Progressive Care Unit Admission status: It is my clinical opinion that admission to INPATIENT is reasonable and necessary because of the expectation that this patient will require hospital care that crosses at least 2 midnights to treat this condition based on the medical complexity of the problems presented.  Given the aforementioned information, the predictability of an adverse outcome is felt to be significant.  Editha Ram MD [facility]  If 7PM-7AM, please contact night-coverage [email or web address]  06/16/2024, 8:13 PM

## 2024-06-16 NOTE — Code Documentation (Signed)
 Stroke Response Nurse Documentation Code Documentation  Amber Harris is a 63 y.o. female arriving to [facility] as Code Stroke activation via [facility]. LKW 1545 when patient was at the park having picnic with family. Suddenly family noted slurred speech and called EMS.SBP in 80s with EMS.  Stroke team met pt at bridge, labs drawn, airway cleared for CT. NIH 12, see flowsheet for details. CT and CTA/P completed. No TNK as symptoms thought to be due to seizure. No LVO. 2mg  Ativan  given 1643. Care Plan: NIH and vitals q19min until OOW (2015) then q2h x12h then unit routine. Some delay with IV access for CTA/P. Note that SBP 70s while in CT, verbal order obtained for 2L NS. Bedside handoff with ED RN Duwaine.    Tonna Lacks K  Rapid Response RN

## 2024-06-16 NOTE — Consult Note (Signed)
 NEUROLOGY CONSULT NOTE   Date of service: June 16, 2024 Patient Name: Amber Harris MRN:  [id number] DOB:  04/22/1961 Chief Complaint: Dysarthria and left-sided weakness Requesting Provider: Randol Simmonds, MD  History of Present Illness  Amber Harris is a 63 y.o. female with hx of stroke with residual left-sided weakness, hypertension, hyperlipidemia, diastolic CHF, TIA, bipolar disorder, schizophrenia, right carotid stenosis, COPD, diabetes and GERD who presents after being found to be staring off into space and unresponsive at a family gathering.  Patient's family noted that she was staring off into space, possibly with some abnormal mouth movements and were unable to get her attention or get her to be able to respond.  They called EMS, and patient transiently improved and was able to move all 4 extremities without weakness.  After she got into the ambulance, she was noted to have severe dysarthria, more abnormal mouth movements, intermittent left gaze deviation and left-sided weakness.  She was noted to be hypotensive for EMS at 1 point with systolic blood pressure in the 70s, and hypotension resolved with administration of IV fluids. TNK was not administered bc we were unable to get in touch with family by phone to confirm last known well or answer inclusion/exclusion criteria. The only information we had was second-hand from EMS and that was insufficient to safely determine that she was a TNK candidate.  LKW: 1545 per report, though unclear Modified rankin score: 2-Slight disability-UNABLE to perform all activities but does not need assistance IV Thrombolysis: No, symptoms due to seizure EVT: No, no LVO  NIHSS components Score: Comment  1a Level of Conscious 0[]  1[x]  2[]  3[]      1b LOC Questions 0[]  1[x]  2[]       1c LOC Commands 0[x]  1[]  2[]       2 Best Gaze 0[]  1[x]  2[]       3 Visual 0[x]  1[]  2[]  3[]      4 Facial Palsy 0[x]  1[]  2[]  3[]      5a Motor Arm - left 0[]  1[]  2[x]  3[]  4[]   UN[]    5b Motor Arm - Right 0[x]  1[]  2[]  3[]  4[]  UN[]    6a Motor Leg - Left 0[]  1[]  2[x]  3[]  4[]  UN[]    6b Motor Leg - Right 0[x]  1[]  2[]  3[]  4[]  UN[]    7 Limb Ataxia 0[]  1[]  2[x]  UN[]      8 Sensory 0[]  1[x]  2[]  UN[]      9 Best Language 0[]  1[x]  2[]  3[]      10 Dysarthria 0[]  1[x]  2[]  UN[]      11 Extinct. and Inattention 0[x]  1[]  2[]       TOTAL:12       ROS   Unable to ascertain due to altered mental status  Past History   Past Medical History:  Diagnosis Date   Aortic valve disease    Long-standing systolic murmur   Asthma    Uses p.r.n. albuterol    Bronchitis    COPD (chronic obstructive pulmonary disease) (HCC)    Degenerative joint disease    right knee   Depression    Depression with anxiety    Diabetes mellitus    Dyspnea    exertion   Dyspnea on exertion    poor exercise tolerance   Fibroids    uterine; postmenopausal bleeding   GERD (gastroesophageal reflux disease)    Headache(784.0)    Hyperlipidemia    Hypertension    Myocardial infarction (HCC)    Obesity    Palpitations    Stroke (HCC)  TIA - left side residual   Syncope    exertional   Tobacco user    30 pack years; 04/2011: 1/4 pack per day during quick attempt    Past Surgical History:  Procedure Laterality Date   ABDOMINAL HYSTERECTOMY  12/21/2011   Procedure: HYSTERECTOMY ABDOMINAL;  Surgeon: Norleen LULLA Server, MD;  Location: [facility];  Service: Gynecology;  Laterality: N/A;   CATARACT EXTRACTION W/PHACO Left 10/03/2020   Procedure: CATARACT EXTRACTION PHACO AND INTRAOCULAR LENS PLACEMENT LEFT EYE;  Surgeon: Harrie Agent, MD;  Location: [facility];  Service: Ophthalmology;  Laterality: Left;  CDE 5.04   CATARACT EXTRACTION W/PHACO Right 10/17/2020   Procedure: CATARACT EXTRACTION PHACO AND INTRAOCULAR LENS PLACEMENT RIGHT EYE;  Surgeon: Harrie Agent, MD;  Location: [facility];  Service: Ophthalmology;  Laterality: Right;  CDE: 4.60   COLONOSCOPY WITH PROPOFOL  N/A 02/14/2024   Procedure: COLONOSCOPY  WITH PROPOFOL ;  Surgeon: Cinderella Deatrice FALCON, MD;  Location: [facility];  Service: Endoscopy;  Laterality: N/A;  9:45AM;ASA 3   LEFT HEART CATH AND CORONARY ANGIOGRAPHY N/A 02/17/2021   Procedure: LEFT HEART CATH AND CORONARY ANGIOGRAPHY;  Surgeon: Wonda Sharper, MD;  Location: [facility] INVASIVE CV LAB;  Service: Cardiovascular;  Laterality: N/A;   POLYPECTOMY  02/14/2024   Procedure: POLYPECTOMY;  Surgeon: Cinderella Deatrice FALCON, MD;  Location: [facility];  Service: Endoscopy;;   SUBMUCOSAL INJECTION  02/14/2024   Procedure: INJECTION, SUBMUCOSAL;  Surgeon: Cinderella Deatrice FALCON, MD;  Location: [facility];  Service: Endoscopy;;   TUBAL LIGATION      Family History: Family History  Problem Relation Age of Onset   Lung cancer Mother    Heart disease Brother    Breast cancer Sister    Heart disease Maternal Aunt    Cancer Cousin        lung   Anesthesia problems Neg Hx    Hypotension Neg Hx    Malignant hyperthermia Neg Hx    Pseudochol deficiency Neg Hx     Social History  reports that she quit smoking about 13 months ago. Her smoking use included cigarettes. She started smoking about 49 years ago. She has a 24.3 pack-year smoking history. She has never used smokeless tobacco. She reports current drug use. Drug: Marijuana. She reports that she does not drink alcohol.  Allergies  Allergen Reactions   Aspirin  Itching    States that she takes aspirin  regularly without significant reaction.   Penicillins Rash    Medications   Current Facility-Administered Medications:    LORazepam  (ATIVAN ) injection 2 mg, 2 mg, Intravenous, Once, Matthews Elida HERO, MD   sodium chloride  flush (NS) 0.9 % injection 3 mL, 3 mL, Intravenous, Once, Randol Simmonds, MD  Current Outpatient Medications:    albuterol  (PROVENTIL ,VENTOLIN ) 90 MCG/ACT inhaler, Inhale 2 puffs into the lungs every 6 (six) hours as needed for wheezing or shortness of breath., Disp: , Rfl:    aspirin  EC 81 MG tablet, Take 81 mg by mouth  daily., Disp: , Rfl:    chlorthalidone  (HYGROTON ) 50 MG tablet, Take 50 mg by mouth daily., Disp: , Rfl:    citalopram (CELEXA) 20 MG tablet, Take 20 mg by mouth every morning., Disp: , Rfl:    clopidogrel  (PLAVIX ) 75 MG tablet, Take 75 mg by mouth every morning., Disp: , Rfl:    Continuous Glucose Sensor (DEXCOM G7 SENSOR) MISC, CHANGE EVERY 10 DAYS, Disp: , Rfl:    ezetimibe  (ZETIA ) 10 MG tablet, TAKE 1 TABLET BY MOUTH ONCE  DAILY, Disp: 30 tablet, Rfl: 10   gabapentin  (NEURONTIN ) 300 MG capsule, Take 300 mg by mouth 3 (three) times daily., Disp: , Rfl:    hydrALAZINE  (APRESOLINE ) 50 MG tablet, TAKE 1 TABLET BY MOUTH TWICE DAILY, Disp: 60 tablet, Rfl: 1   Insulin  Disposable Pump (OMNIPOD DASH PODS, GEN 4,) MISC, Change every 48 hours, Disp: , Rfl:    insulin  lispro (HUMALOG) 100 UNIT/ML injection, USE IN OMNIPOD. MAX 100 UNITS PER DAY *DISCONTINUE NOVOLOG *, Disp: , Rfl:    JARDIANCE  25 MG TABS tablet, Take 25 mg by mouth daily. , Disp: , Rfl:    methocarbamol  (ROBAXIN ) 500 MG tablet, Take 500 mg by mouth at bedtime., Disp: , Rfl:    omeprazole  (PRILOSEC) 20 MG capsule, Take 1 capsule (20 mg total) by mouth daily., Disp: 90 capsule, Rfl: 1   risperiDONE  (RISPERDAL ) 0.5 MG tablet, Take 0.5 mg by mouth at bedtime., Disp: , Rfl:    rosuvastatin  (CRESTOR ) 40 MG tablet, TAKE 1 TABLET BY MOUTH EVERY DAY AT BEDTIME, Disp: 90 tablet, Rfl: 2  Vitals   Vitals:   Jun 23, 2024 1646  Weight: 126.4 kg    Body mass index is 42.37 kg/m.   Physical Exam   Constitutional: Appears well-developed and well-nourished.  Psych: Affect blunted Eyes: No scleral injection.  HENT: No OP obstruction.  Head: Normocephalic.  Cardiovascular: Normal rate and regular rhythm.  Respiratory: Effort normal, non-labored breathing.  Skin: WDI.   Neurologic Examination   NEURO:  Mental Status: Patient is drowsy and rests with eyes closed but responds to touch and voice.  She responds to her name but is unable to state  place time or situation Speech/Language: speech is with moderate dysarthria and is in single words and short phrases.  She is able to name some but not all objects and cannot describe what is going on in the NIHSS picture  Cranial Nerves:  II: PERRL.  Inconsistently blinks to threat bilaterally III, IV, VI: Intermittent left gaze deviation V: Sensation is intact to light touch and symmetrical to face.  VII: Smile is symmetrical.  VIII: hearing intact to voice. IX, X: Voice is dysarthric KP:Dynloizm shrug symmetrical XII: tongue is midline without fasciculations. Motor: Able to move right upper and lower extremities with good antigravity strength, moves left upper and lower extremities but is weaker and has drift with both upper and lower extremity hitting the bed Tone: is normal and bulk is normal Sensation- Intact to light touch bilaterally, unclear if diminished on the left Coordination: FTN with bilateral ataxia Gait- deferred    Labs/Imaging/Neurodiagnostic studies   CBC:  Recent Labs  Lab 06-23-2024 1638 2024-06-23 1647  WBC 8.2  --   NEUTROABS 3.0  --   HGB 12.4 12.6  HCT 38.0 37.0  MCV 95.5  --   PLT 175  --    Basic Metabolic Panel:  Lab Results  Component Value Date   NA 140 06/23/2024   K 3.0 (L) Jun 23, 2024   CO2 25 02/09/2024   GLUCOSE 126 (H) Jun 23, 2024   BUN 23 June 23, 2024   CREATININE 1.40 (H) Jun 23, 2024   CALCIUM  9.0 02/09/2024   GFRNONAA >60 02/09/2024   GFRAA >90 10/15/2014   Lipid Panel:  Lab Results  Component Value Date   LDLCALC 89 02/16/2021   HgbA1c:  Lab Results  Component Value Date   HGBA1C 6.3 (H) 02/14/2021   Urine Drug Screen: No results found for: LABOPIA, COCAINSCRNUR, LABBENZ, AMPHETMU, THCU, LABBARB  Alcohol Level No results found  for: [facility] INR  Lab Results  Component Value Date   INR 0.9 06/16/2024   APTT  Lab Results  Component Value Date   APTT 28 06/16/2024    CT Head without contrast(Personally  reviewed): Small, age-indeterminate right cerebellar infarct  CT angio Head and Neck with perfusion (Personally reviewed): 1. The common carotid and internal carotid arteries are patent in the neck without stenosis. Mild atherosclerotic plaque bilaterally. 2. Vertebral arteries patent within the neck. Atherosclerotic plaque at both vertebral artery origins (mild stenosis on the right, suspected moderate stenosis on the left). 3. 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 4. Cervical spondylosis. 5. Aortic Atherosclerosis (ICD10-I70.0).   CTA head:   1. Focal occlusion versus severe near-occlusive stenosis of the proximal right posterior inferior cerebellar artery (PICA), new from the prior CTA of 02/15/2021. 2. Background intracranial atherosclerotic disease as described within the body of the report. 3. At least moderate stenosis of the cavernous right internal carotid artery, unchanged. 4. New severe stenosis within a left middle cerebral artery proximal M2 branch. 5. Unchanged moderate stenosis within a left posterior cerebral artery branch at the P2/P3 junction.  CTP no perfusion deficit  MRI Brain(Personally reviewed): Pending  Neurodiagnostics rEEG:  Pending  ASSESSMENT   Amber Harris is a 63 y.o. female with hx of stroke with residual left-sided weakness, hypertension, hyperlipidemia, diastolic CHF, TIA, bipolar disorder, schizophrenia, right carotid stenosis, COPD, diabetes and GERD who presents with an episode of decreased responsiveness with abnormal mouth movements while at a family gathering today.  Patient's family noted that she was staring off into space and they were unable to get her to pay attention to them or respond.  They called EMS, and patient transiently improved, was alert and oriented and able to move all 4 extremities.  After she got into the ambulance, the abnormal mouth movements began again  and patient became dysarthric with left-sided weakness.  She was noted to be hypotensive for EMS, but this resolved with administration of IV fluids.  On arrival to the bridge, she was noted to have abnormal chewing mouth movements with intermittent left gaze deviation.  Lorazepam  was administered to terminate seizure with resultant cessation of chewing mouth movements.  Patient was then noted on exam to have left-sided weakness, but left gaze deviation terminated.  While in CT scan, patient was noted to be hypotensive with systolic blood pressure in the 70s, and 1 L of IV fluids was administered.  CT chest abdomen and pelvis was performed to uncover any cause of hypotension such as aortic dissection.  Initial CT head revealed no acute abnormality, no LVO was seen on CTA and CT perfusion revealed no core or penumbra infarct.  This R PICA occlusion and age-indeterminate R cerebellar occlusion are related although it's unclear if both are acute and they would not explain patient's current presentation. Will load patient with Keppra  and connect to LTM EEG.  Impression: Seizure Hypotension R cerebellar PICA infarct of unclear chronicity  RECOMMENDATIONS   - Load with Keppra  2000 mg and begin Keppra  500 mg twice daily - Long-term EEG with MRI-compatible leads - Seizure precautions - Permissive HTN x48 hrs from sx onset or until stroke ruled out by MRI goal BP <220/110. PRN labetalol  or hydralazine  if BP above these parameters. Avoid oral antihypertensives. Avoid hypotension, ideally maintain SBP > 130 2/2 known intracranial occlusions - MRI brain wo contrast - TTE w/ bubble - Check A1c and LDL +  add statin per guidelines - ASA 300mg  PR now, further guidance to be provided tomorrow - NPO until RN dysphagia screen is passed and documented - q4 hr neuro checks - STAT head CT for any change in neuro exam - Tele - PT/OT/SLP - Stroke education - Amb referral to neurology upon discharge  - Neurology will  continue to follow  ______________________________________________________________________  Patient seen by NP with MD, MD to edit note as needed.  Signed, Cortney E Everitt Clint Kill, NP [facility]    Attending Neurohospitalist Addendum Patient seen and examined with APP/Resident. Agree with the history and physical as documented above. Agree with the plan as documented, which I helped formulate. I have edited the note above to reflect my full findings and recommendations. I have independently reviewed the chart, obtained history, review of systems and examined the patient.I have personally reviewed pertinent head/neck/spine imaging (CT/MRI). Please feel free to call with any questions.  -- Elida Ross, MD [facility] [phone]  If 7pm- 7am, please page neurology on call as listed in AMION.

## 2024-06-16 NOTE — ED Triage Notes (Signed)
 Pt arrived to ED via EMS from a local park where the patient was sitting with family and at approximately 51 family noted the patient to have very garbled speech and noticed left sided weakness. Pt has history of stroke in the past family immediately called 911.

## 2024-06-16 NOTE — ED Provider Notes (Signed)
 [place] EMERGENCY DEPARTMENT AT [facility] Provider Note   CSN: [id number] Arrival date & time: 06/16/24  1636  An emergency department physician performed an initial assessment on this suspected stroke patient at 1637.  Patient presents with: Code Stroke   Amber Harris is a 63 y.o. female.   HPI   Patient has a history of syncope diabetes hypertension depression COPD myocardial infarction, stroke.  Patient was sitting with her family today at a park.  At 1545 family noted patient had garbled speech as well as left-sided weakness.  EMS was called and patient presented as a code stroke.  Family also had noticed that the patient was staring off to space.  She was having some repetitive mouth movements.  EMS also noted the patient was hypotensive.  Prior to Admission medications   Medication Sig Start Date End Date Taking? Authorizing Provider  albuterol  (PROVENTIL ,VENTOLIN ) 90 MCG/ACT inhaler Inhale 2 puffs into the lungs every 6 (six) hours as needed for wheezing or shortness of breath.    [provider]  aspirin  EC 81 MG tablet Take 81 mg by mouth daily.    [provider]  chlorthalidone  (HYGROTON ) 50 MG tablet Take 50 mg by mouth daily.    [provider]  citalopram (CELEXA) 20 MG tablet Take 20 mg by mouth every morning. 02/11/23   [provider]  clopidogrel  (PLAVIX ) 75 MG tablet Take 75 mg by mouth every morning. 02/11/23   [provider]  Continuous Glucose Sensor (DEXCOM G7 SENSOR) MISC CHANGE EVERY 10 DAYS    [provider]  ezetimibe  (ZETIA ) 10 MG tablet TAKE 1 TABLET BY MOUTH ONCE DAILY 11/24/23   Alvan Dorn FALCON, MD  gabapentin  (NEURONTIN ) 300 MG capsule Take 300 mg by mouth 3 (three) times daily. 02/14/23   [provider]  hydrALAZINE  (APRESOLINE ) 50 MG tablet TAKE 1 TABLET BY MOUTH TWICE DAILY 05/24/24   Alvan Dorn FALCON, MD  Insulin  Disposable Pump (OMNIPOD DASH PODS, GEN 4,) MISC Change  every 48 hours 10/22/23   [provider]  insulin  lispro (HUMALOG) 100 UNIT/ML injection USE IN OMNIPOD. MAX 100 UNITS PER DAY *DISCONTINUE NOVOLOG *    [provider]  JARDIANCE  25 MG TABS tablet Take 25 mg by mouth daily.  03/24/16   [provider]  methocarbamol  (ROBAXIN ) 500 MG tablet Take 500 mg by mouth at bedtime. 01/14/21   [provider]  omeprazole  (PRILOSEC) 20 MG capsule Take 1 capsule (20 mg total) by mouth daily. 03/13/24   Alvan Dorn FALCON, MD  risperiDONE  (RISPERDAL ) 0.5 MG tablet Take 0.5 mg by mouth at bedtime.    [provider]  rosuvastatin  (CRESTOR ) 40 MG tablet TAKE 1 TABLET BY MOUTH EVERY DAY AT BEDTIME 08/12/23   Alvan Dorn FALCON, MD    Allergies: Aspirin  and Penicillins    Review of Systems  Updated Vital Signs BP (!) 109/49   Pulse 63   Temp (!) 97.1 F (36.2 C) (Axillary)   Resp 16   Wt 126.4 kg   LMP 10/25/2011   SpO2 98%   BMI 42.37 kg/m   Physical Exam Vitals and nursing note reviewed.  Constitutional:      Appearance: She is well-developed. She is not diaphoretic.  HENT:     Head: Normocephalic and atraumatic.     Right Ear: External ear normal.     Left Ear: External ear normal.  Eyes:     General: No scleral icterus.  Right eye: No discharge.        Left eye: No discharge.     Conjunctiva/sclera: Conjunctivae normal.  Neck:     Trachea: No tracheal deviation.  Cardiovascular:     Rate and Rhythm: Normal rate and regular rhythm.  Pulmonary:     Effort: Pulmonary effort is normal. No respiratory distress.     Breath sounds: Normal breath sounds. No stridor. No wheezing or rales.  Abdominal:     General: Bowel sounds are normal. There is no distension.     Palpations: Abdomen is soft.     Tenderness: There is no abdominal tenderness. There is no guarding or rebound.  Musculoskeletal:        General: No tenderness or deformity.     Cervical back: Neck supple.  Skin:    General: Skin  is warm and dry.     Findings: No rash.  Neurological:     General: No focal deficit present.     Mental Status: She is alert.     Cranial Nerves: No dysarthria or facial asymmetry.     Motor: No abnormal muscle tone or seizure activity.     Comments: Patient currently somnolent as she was given Versed , on arrival patient noted to have left-sided weakness compared to the right, no dysarthria was noted  Psychiatric:        Mood and Affect: Mood normal.     (all labs ordered are listed, but only abnormal results are displayed) Labs Reviewed  DIFFERENTIAL - Abnormal; Notable for the following components:      Result Value   Lymphs Abs 4.3 (*)    All other components within normal limits  COMPREHENSIVE METABOLIC PANEL WITH GFR - Abnormal; Notable for the following components:   Potassium 3.0 (*)    CO2 21 (*)    Glucose, Bld 132 (*)    Creatinine, Ser 1.34 (*)    Calcium  8.5 (*)    Albumin 2.9 (*)    GFR, Estimated 45 (*)    All other components within normal limits  I-STAT CHEM 8, ED - Abnormal; Notable for the following components:   Potassium 3.0 (*)    Creatinine, Ser 1.40 (*)    Glucose, Bld 126 (*)    Calcium , Ion 1.09 (*)    All other components within normal limits  CBG MONITORING, ED - Abnormal; Notable for the following components:   Glucose-Capillary 130 (*)    All other components within normal limits  PROTIME-INR  APTT  CBC  ETHANOL  URINALYSIS, ROUTINE W REFLEX MICROSCOPIC    EKG: None  Radiology: CT ANGIO HEAD NECK W WO CM W PERF (CODE STROKE) Addendum Date: 06/16/2024 ADDENDUM REPORT: 06/16/2024 18:38 ADDENDUM: CTA head impressions #1 and #4 called by telephone on 06/16/2024 at 6:08 pm to provider [facility] , who verbally acknowledged these results. Negative perfusion results were also discussed at this time. Electronically Signed   By: Rockey Childs D.O.   On: 06/16/2024 18:38   Result Date: 06/16/2024 CLINICAL DATA:  Provided history: Neuro deficit,  acute, stroke suspected. EXAM: CT ANGIOGRAPHY HEAD AND NECK CT PERFUSION BRAIN TECHNIQUE: Multidetector CT imaging of the head and neck was performed using the standard protocol during bolus administration of intravenous contrast. Multiplanar CT image reconstructions and MIPs were obtained to evaluate the vascular anatomy. Carotid stenosis measurements (when applicable) are obtained utilizing NASCET criteria, using the distal internal carotid diameter as the denominator. Multiphase CT imaging of the brain was performed  following IV bolus contrast injection. Subsequent parametric perfusion maps were calculated using RAPID software. RADIATION DOSE REDUCTION: This exam was performed according to the departmental dose-optimization program which includes automated exposure control, adjustment of the mA and/or kV according to patient size and/or use of iterative reconstruction technique. CONTRAST:  75mL OMNIPAQUE  IOHEXOL  350 MG/ML SOLN COMPARISON:  Non-contrast head CT performed earlier today 06/16/2024. CT angiogram head/neck and CT perfusion head 02/15/2021. FINDINGS: CTA NECK FINDINGS Aortic arch: Standard aortic branching. Atherosclerotic plaque within the visualized aortic arch and proximal major branch vessels of the neck. No hemodynamically significant innominate or proximal subclavian artery stenosis. Right carotid system: CCA and ICA patent within the neck without stenosis. Mild atherosclerotic plaque scattered within the CCA and about the carotid bifurcation. Left carotid system: CCA and ICA patent within the neck without stenosis. Mild sclerotic plaque about the carotid bifurcation. Vertebral arteries: Codominant and patent within the neck. Atherosclerotic plaque at the right vertebral artery origin resulting in mild stenosis. Atherosclerotic plaque at the left vertebral artery origin resulting in a suspected moderate stenosis Skeleton: Cervical spondylosis. Multilevel cervical ventrolateral osteophytes, some  bridging. No acute fracture or aggressive osseous lesion. Other neck: 6.0 x 3.7 cm lipoma within the left neck (superficial to the left sternocleidomastoid muscle). Upper chest: No consolidation within the imaged lung apices. Review of the MIP images confirms the above findings CTA HEAD FINDINGS Anterior circulation: The intracranial internal carotid arteries are patent. Atherosclerotic plaque within both vessels. Calcified plaque results in at least moderate stenosis of the cavernous segment on the right. No more than mild stenosis elsewhere within the intracranial ICAs. The M1 middle cerebral arteries are patent. Atherosclerotic irregularity of the M2 and more distal MCA vessels bilaterally. Most notably, there is a new severe stenosis within a superior division proximal left MCA M2 branch (series 8, image 26). The anterior cerebral arteries are patent. No intracranial aneurysm is identified. Posterior circulation: The intracranial vertebral arteries are patent. New focal occlusion versus severe near-occlusive stenosis of the proximal right PICA. The basilar artery is patent. The posterior cerebral arteries are patent. Atherosclerotic irregularity of both vessels. Most notably, there is an unchanged moderate stenosis within a left PCA branch at the P2/P3 junction. A small left posterior communicating artery is present. The right posterior communicating artery is diminutive or absent Venous sinuses: Within the limitations of contrast timing, no convincing thrombus. Anatomic variants: As described. Review of the MIP images confirms the above findings CT Brain Perfusion Findings: CBF (<30%) Volume: 0mL Perfusion (Tmax>6.0s) volume: 0mL Mismatch Volume: 0mL Infarction Location:None identified. Attempts are being made to reach the ordering provider at this time. IMPRESSION: CTA neck: 1. The common carotid and internal carotid arteries are patent in the neck without stenosis. Mild atherosclerotic plaque bilaterally. 2.  Vertebral arteries patent within the neck. Atherosclerotic plaque at both vertebral artery origins (mild stenosis on the right, suspected moderate stenosis on the left). 3. 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 4. Cervical spondylosis. 5. Aortic Atherosclerosis (ICD10-I70.0). CTA head: 1. Focal occlusion versus severe near-occlusive stenosis of the proximal right posterior inferior cerebellar artery (PICA), new from the prior CTA of 02/15/2021. 2. Background intracranial atherosclerotic disease as described within the body of the report. 3. At least moderate stenosis of the cavernous right internal carotid artery, unchanged. 4. New severe stenosis within a left middle cerebral artery proximal M2 branch. 5. Unchanged moderate stenosis within a left posterior cerebral artery branch at the P2/P3  junction. Electronically Signed: By: Rockey Childs D.O. On: 06/16/2024 17:54   CT Angio Chest/Abd/Pel for Dissection W and/or Wo Contrast Result Date: 06/16/2024 CLINICAL DATA:  Provided history: hypotension, neuro deficits EXAM: CT ANGIOGRAPHY CHEST, ABDOMEN AND PELVIS TECHNIQUE: Non-contrast CT of the chest was initially obtained. Multidetector CT imaging through the chest, abdomen and pelvis was performed using the standard protocol during bolus administration of intravenous contrast. Multiplanar reconstructed images and MIPs were obtained and reviewed to evaluate the vascular anatomy. RADIATION DOSE REDUCTION: This exam was performed according to the departmental dose-optimization program which includes automated exposure control, adjustment of the mA and/or kV according to patient size and/or use of iterative reconstruction technique. CONTRAST:  80mL OMNIPAQUE  IOHEXOL  350 MG/ML SOLN COMPARISON:  Report from chest CT 12/27/2021, abdominal CT 09/03/2021. Images not available. FINDINGS: CTA CHEST FINDINGS Cardiovascular: No aortic hematoma on unenhanced exam. The left  superior intercostal vein courses over the aortic arch. Mild aortic atherosclerosis. No aneurysm, dissection or acute aortic finding. Prominent main pulmonary artery at 3.2 cm. There is no central pulmonary embolus to the segmental level. The heart is enlarged. Small pericardial effusion. Mediastinum/Nodes: No mediastinal or hilar adenopathy. No enlarged axillary lymph nodes. Patulous esophagus. Lungs/Pleura: Dependent hypoventilatory changes. Breathing motion artifact limits detailed assessment. No pleural fluid. No features of pulmonary edema. Trachea and central airways are clear. Musculoskeletal: Diffuse thoracic spondylosis with anterior spurring. There are no acute or suspicious osseous abnormalities. Review of the MIP images confirms the above findings. CTA ABDOMEN AND PELVIS FINDINGS VASCULAR Aorta: Normal caliber aorta without aneurysm, dissection, vasculitis or significant stenosis. Moderate calcified atheromatous plaque. Celiac: Patent without evidence of aneurysm, dissection, vasculitis or significant stenosis. SMA: Patent without evidence of aneurysm, dissection, vasculitis or significant stenosis. Replaced right hepatic artery arises from the SMA. Mixed calcified and noncalcified atheromatous plaque. Renals: Plaque at the origin of both renal arteries. Distal renal arteries are patent. No severe stenosis, evidence of vasculitis or FMD. IMA: Small in caliber but patent. Inflow: Patent without evidence of aneurysm, dissection, vasculitis or significant stenosis. Moderate atheromatous plaque. Veins: No obvious venous abnormality within the limitations of this arterial phase study. Review of the MIP images confirms the above findings. NON-VASCULAR Hepatobiliary: The liver is enlarged spanning 19.2 cm cranial caudal. No evidence of focal liver lesion on this arterial phase exam. Gallbladder physiologically distended, no calcified stone. No biliary dilatation. Pancreas: No ductal dilatation or inflammation.  Spleen: Normal in size and arterial enhancement. Adrenals/Urinary Tract: 12 mm left adrenal nodule was described on prior exam, needing no further imaging follow-up. Excretion of IV contrast in both renal collecting systems from neck CTA earlier today. No hydronephrosis or evidence of renal inflammation. Mild bladder wall thickening. Stomach/Bowel: No bowel obstruction or inflammation. Normal appendix. Moderate to large volume of stool in the colon. Lymphatic: Enlarged lymph nodes in the abdomen or pelvis. Reproductive: Status post hysterectomy. No adnexal masses. Other: No free air or ascites. Postsurgical change of the lower anterior abdominal wall. Musculoskeletal: Lower lumbar facet hypertrophy. Bilateral hip arthropathy. There are no acute or suspicious osseous abnormalities. Review of the MIP images confirms the above findings. IMPRESSION: 1. No aortic dissection or acute aortic abnormality. 2. Cardiomegaly with small pericardial effusion. 3. Prominent main pulmonary artery, can be seen with pulmonary arterial hypertension. 4. Mild bladder wall thickening, recommend correlation with urinalysis. 5. Moderate to large volume of stool in the colon, can be seen with constipation. Aortic Atherosclerosis (ICD10-I70.0). Electronically Signed   By: Andrea Marlee HERO.D.  On: 06/16/2024 18:17   CT HEAD CODE STROKE WO CONTRAST Result Date: 06/16/2024 CLINICAL DATA:  Code stroke. Neuro deficit, acute, stroke suspected. EXAM: CT HEAD WITHOUT CONTRAST TECHNIQUE: Contiguous axial images were obtained from the base of the skull through the vertex without intravenous contrast. RADIATION DOSE REDUCTION: This exam was performed according to the departmental dose-optimization program which includes automated exposure control, adjustment of the mA and/or kV according to patient size and/or use of iterative reconstruction technique. COMPARISON:  Brain MRI 02/16/2021. FINDINGS: Brain: Mild generalized cerebral atrophy. The  cerebral white matter disease described on the prior brain MRI of 02/16/2021 is occult by CT. Small infarct within the right cerebellar hemisphere, new from the prior brain MRI of 02/16/2021 but otherwise age-indeterminate. Partially empty sella turcica. There is no acute intracranial hemorrhage. No demarcated cortical infarct. No extra-axial fluid collection. No evidence of an intracranial mass. No midline shift. Vascular: No hyperdense vessel. Atherosclerotic calcifications. Skull: No calvarial fracture or aggressive osseous lesion. Sinuses/Orbits: No mass or acute finding within the imaged orbits. No significant paranasal sinus disease at the imaged levels. ASPECTS [facility] Stroke Program Early CT Score) - Ganglionic level infarction (caudate, lentiform nuclei, internal capsule, insula, M1-M3 cortex): 7 - Supraganglionic infarction (M4-M6 cortex): 3 Total score (0-10 with 10 being normal): 10 Impression #1 communicated to Dr. Matthews at 5:08 pmon 7/19/2025by text page via the [facility] messaging system. IMPRESSION: 1. Small infarct within the right cerebellar hemisphere, new from the prior brain MRI of 02/16/2021 but otherwise age-indeterminate. Consider a brain MRI for further evaluation. 2. No acute intracranial hemorrhage or acute demarcated cortical infarct. Electronically Signed   By: Rockey Childs D.O.   On: 06/16/2024 17:10     .Critical Care  Performed by: Randol Simmonds, MD Authorized by: Randol Simmonds, MD   Critical care provider statement:    Critical care time (minutes):  30   Critical care was time spent personally by me on the following activities:  Development of treatment plan with patient or surrogate, discussions with consultants, evaluation of patient's response to treatment, examination of patient, ordering and review of laboratory studies, ordering and review of radiographic studies, ordering and performing treatments and interventions, pulse oximetry, re-evaluation of patient's condition and  review of old charts    Medications Ordered in the ED  levETIRAcetam  (KEPPRA ) undiluted injection 500 mg (has no administration in time range)  sodium chloride  flush (NS) 0.9 % injection 3 mL (3 mLs Intravenous Given 06/16/24 1740)  LORazepam  (ATIVAN ) injection 2 mg (2 mg Intravenous Given 06/16/24 1643)  iohexol  (OMNIPAQUE ) 350 MG/ML injection 80 mL (75 mLs Intravenous Contrast Given 06/16/24 1726)  iohexol  (OMNIPAQUE ) 350 MG/ML injection 80 mL (80 mLs Intravenous Contrast Given 06/16/24 1730)  levETIRAcetam  (KEPPRA ) undiluted injection 2,000 mg (2,000 mg Intravenous Given 06/16/24 1759)  0.9 %  sodium chloride  infusion ( Intravenous New Bag/Given 06/16/24 1749)    Clinical Course as of 06/16/24 1959  Sat Jun 16, 2024  1707 CBC Normal [JK]  1840 Patient's metabolic panel showed slightly increased creatinine.  CBC was normal. [JK]  1840 CT shows stenosis of the right posterior inferior cerebral artery [JK]  1841 Chest Abdo pelvis CT does not show any evidence of aortic dissection.  Patient does have moderate to large amount of stool in colon [JK]  1958 Case discussed with Dr Alfornia [JK]    Clinical Course User Index [JK] Randol Simmonds, MD  Medical Decision Making Amount and/or Complexity of Data Reviewed Labs: ordered. Decision-making details documented in ED Course. Radiology: ordered.  Risk Prescription drug management. Decision regarding hospitalization.   Patient presented to ED with concerns of possible stroke versus seizure.  Seen by neurology on arrival.  Patient not felt to be a tPA candidate.  Patient also noted to be hypotensive initially so CT angiogram chest abdomen pelvis performed.  No signs of acute aortic dissection.  No signs of perforated viscus.  Patient was noted to have some findings of constipation and questionable bowel or thickening.  Will add on a urinalysis.  Patient will require admission to the hospital for further  evaluation.  Patient will be started on Keppra .  Plan is for MRI.  Patient will need further stroke workup including TTE and EEG monitoring    Final diagnoses:  Seizure [facility])    ED Discharge Orders     None          Randol Simmonds, MD 06/16/24 [phone]

## 2024-06-16 NOTE — Progress Notes (Signed)
 Pt moving to floor per RN, will return to set up LTM after settled in room

## 2024-06-17 ENCOUNTER — Inpatient Hospital Stay (HOSPITAL_COMMUNITY)

## 2024-06-17 ENCOUNTER — Encounter (HOSPITAL_COMMUNITY)

## 2024-06-17 DIAGNOSIS — I9589 Other hypotension: Secondary | ICD-10-CM | POA: Diagnosis not present

## 2024-06-17 DIAGNOSIS — I63541 Cerebral infarction due to unspecified occlusion or stenosis of right cerebellar artery: Secondary | ICD-10-CM | POA: Diagnosis not present

## 2024-06-17 DIAGNOSIS — R569 Unspecified convulsions: Secondary | ICD-10-CM | POA: Diagnosis not present

## 2024-06-17 DIAGNOSIS — I69354 Hemiplegia and hemiparesis following cerebral infarction affecting left non-dominant side: Secondary | ICD-10-CM | POA: Diagnosis not present

## 2024-06-17 LAB — LIPID PANEL
Cholesterol: 148 mg/dL (ref 0–200)
HDL: 61 mg/dL (ref >40)
LDL Cholesterol: 74 mg/dL (ref 0–99)
Total CHOL/HDL Ratio: 2.4 ratio
Triglycerides: 65 mg/dL (ref <150)
VLDL: 13 mg/dL (ref 0–40)

## 2024-06-17 LAB — BASIC METABOLIC PANEL WITH GFR
Anion gap: 13 (ref 5–15)
BUN: 14 mg/dL (ref 8–23)
CO2: 18 mmol/L — ABNORMAL LOW (ref 22–32)
Calcium: 8.5 mg/dL — ABNORMAL LOW (ref 8.9–10.3)
Chloride: 109 mmol/L (ref 98–111)
Creatinine, Ser: 0.94 mg/dL (ref 0.44–1.00)
GFR, Estimated: >60 mL/min (ref >60)
Glucose, Bld: 98 mg/dL (ref 70–99)
Potassium: 3.7 mmol/L (ref 3.5–5.1)
Sodium: 140 mmol/L (ref 135–145)

## 2024-06-17 LAB — GLUCOSE, CAPILLARY
Glucose-Capillary: 104 mg/dL — ABNORMAL HIGH (ref 70–99)
Glucose-Capillary: 104 mg/dL — ABNORMAL HIGH (ref 70–99)
Glucose-Capillary: 50 mg/dL — ABNORMAL LOW (ref 70–99)
Glucose-Capillary: 61 mg/dL — ABNORMAL LOW (ref 70–99)
Glucose-Capillary: 62 mg/dL — ABNORMAL LOW (ref 70–99)
Glucose-Capillary: 68 mg/dL — ABNORMAL LOW (ref 70–99)
Glucose-Capillary: 77 mg/dL (ref 70–99)
Glucose-Capillary: 86 mg/dL (ref 70–99)
Glucose-Capillary: 95 mg/dL (ref 70–99)

## 2024-06-17 LAB — ECHOCARDIOGRAM COMPLETE BUBBLE STUDY
AR max vel: 3.04 cm2
AV Area VTI: 3.3 cm2
AV Area mean vel: 2.8 cm2
AV Mean grad: 6 mmHg
AV Peak grad: 10.9 mmHg
Ao pk vel: 1.65 m/s
Area-P 1/2: 3.45 cm2
P 1/2 time: 568 ms
S' Lateral: 3.5 cm

## 2024-06-17 LAB — BRAIN NATRIURETIC PEPTIDE: B Natriuretic Peptide: 72 pg/mL (ref 0.0–100.0)

## 2024-06-17 LAB — TSH: TSH: 0.785 u[IU]/mL (ref 0.350–4.500)

## 2024-06-17 LAB — MAGNESIUM: Magnesium: 2 mg/dL (ref 1.7–2.4)

## 2024-06-17 MED ORDER — DEXTROSE 10 % IV SOLN: INTRAVENOUS | Status: DC

## 2024-06-17 MED ORDER — LORAZEPAM 2 MG/ML IJ SOLN
INTRAMUSCULAR | Status: AC
  Administered 2024-06-17: 1 mg via INTRAVENOUS
  Filled 2024-06-17: qty 1

## 2024-06-17 MED ORDER — LEVETIRACETAM (KEPPRA) 500 MG/5 ML ADULT IV PUSH
1000.0000 mg | Freq: Once | INTRAVENOUS | Status: AC
  Administered 2024-06-17: 1000 mg via INTRAVENOUS

## 2024-06-17 MED ORDER — DEXTROSE 50 % IV SOLN
12.5000 g | INTRAVENOUS | Status: AC
  Administered 2024-06-17: 12.5 g via INTRAVENOUS
  Filled 2024-06-17: qty 50

## 2024-06-17 MED ORDER — LORAZEPAM 2 MG/ML IJ SOLN
INTRAMUSCULAR | Status: AC
  Filled 2024-06-17: qty 1

## 2024-06-17 MED ORDER — DEXTROSE 50 % IV SOLN
INTRAVENOUS | Status: AC
  Administered 2024-06-17: 50 mL
  Filled 2024-06-17: qty 50

## 2024-06-17 MED ORDER — LORAZEPAM 2 MG/ML IJ SOLN
1.0000 mg | Freq: Once | INTRAMUSCULAR | Status: AC
  Administered 2024-06-17: 1 mg via INTRAVENOUS

## 2024-06-17 MED ORDER — DEXTROSE 50 % IV SOLN: 50.0000 mL | Freq: Once | INTRAVENOUS | Status: AC

## 2024-06-17 MED ORDER — LORAZEPAM 2 MG/ML IJ SOLN: 1.0000 mg | Freq: Once | INTRAMUSCULAR | Status: AC

## 2024-06-17 MED ORDER — ALBUTEROL SULFATE (2.5 MG/3ML) 0.083% IN NEBU: 2.5000 mg | INHALATION_SOLUTION | Freq: Four times a day (QID) | RESPIRATORY_TRACT | Status: DC | PRN

## 2024-06-17 MED ORDER — LEVETIRACETAM (KEPPRA) 500 MG/5 ML ADULT IV PUSH
1000.0000 mg | Freq: Two times a day (BID) | INTRAVENOUS | Status: DC
  Administered 2024-06-17 – 2024-06-18 (×2): 1000 mg via INTRAVENOUS
  Filled 2024-06-17 (×3): qty 10

## 2024-06-17 NOTE — Procedures (Signed)
 Patient Name: Amber Harris  MRN: [id number]  Epilepsy Attending: Arlin MALVA Krebs  Referring Physician/Provider: everitt Clint Abbey Earle FORBES, NP  Duration: 06/17/2024 0451 to 0600  Patient history:  63 y.o. female with hx of stroke with residual left-sided weakness, right carotid stenosis who presents with an episode of decreased responsiveness with abnormal mouth movements while at a family gathering. EEG to evaluate for seizure  Level of alertness: Awake, asleep  AEDs during EEG study: LEV  Technical aspects: This EEG study was done with scalp electrodes positioned according to the 10-20 International system of electrode placement. Electrical activity was reviewed with band pass filter of 1-70Hz , sensitivity of 7 uV/mm, display speed of 46mm/sec with a 60Hz  notched filter applied as appropriate. EEG data were recorded continuously and digitally stored.  Video monitoring was available and reviewed as appropriate.  Description: The posterior dominant rhythm consists of 9-10 Hz activity of moderate voltage (25-35 uV) seen predominantly in posterior head regions, symmetric and reactive to eye opening and eye closing. Sleep was characterized by vertex waves, sleep spindles (12 to 14 Hz), maximal frontocentral region. Hyperventilation and photic stimulation were not performed.     IMPRESSION: This study is within normal limits. No seizures or epileptiform discharges were seen throughout the recording.  A normal interictal EEG does not exclude the diagnosis of epilepsy.   Daequan Kozma O Jahquan Klugh

## 2024-06-17 NOTE — Significant Event (Signed)
 Rapid Response Event Note   Reason for Call :  Staring off into space  Initial Focused Assessment:  Pt suddenly not at baseline, neglecting left side with mouth moving automatism--pt presenting similar to yesterday upon arrival. Pt is on LTM EEG though unsure if machine was running.   149/60 (88) HR 53 RR 18 O2 100% RA  CBG 86  Interventions/Plan of Care:  Neuro NP and MD to bedside Ativan  IV 1mg  given 1203 and 1mg  given 1219 Keppra  1g IV Adjust scheduled keppra    Event Summary:  MD Notified: Vertell NPKYM Ross MD Call Time: 8862 Arrival Time:  End Time: 1240  Tonna Chiquita POUR, RN

## 2024-06-17 NOTE — Progress Notes (Signed)
 SLP Cancellation Note  Patient Details Name: Amber Harris MRN: [id number] DOB: 1961/03/03   Cancelled treatment:        Orders for cognitive linguistic evaluation received and appreciated.  Per RN, pt with sudden change in status, suspicious for ongoing seizure activity.  Pt is not medically ready for assessment at this time.  SLP will follow for readiness to participate in cognitive-linguistic assessment.   Anette FORBES Grippe, MA, CCC-SLP Acute Rehabilitation Services Office: [phone] 06/17/2024, 11:55 AM

## 2024-06-17 NOTE — Progress Notes (Signed)
 NEUROLOGY CONSULT FOLLOW UP NOTE   Date of service: June 17, 2024 Patient Name: Amber Harris MRN:  [id number] DOB:  [birth year]  Interval Hx/subjective   - Patient had another seizure like episode today around 11:30. The start of the event did not have any ictal correlate but then the machine stopped recording. We are working to get that fixed. Semiology was gaze deviation to the R, not speaking or following commands, oral automatisms.  Vitals   Vitals:   06/17/24 0442 06/17/24 0732 06/17/24 1125 06/17/24 1220  BP: 120/74 125/77 (!) 149/60 138/63  Pulse: (!) 44 (!) 43 (!) 52   Resp: 18 15 14    Temp: (!) 97.4 F (36.3 C) 97.6 F (36.4 C) 97.9 F (36.6 C)   TempSrc: Axillary Oral Oral   SpO2: 100% 100% 100% 100%  Weight:         Body mass index is 42.37 kg/m.  Physical Exam   Constitutional: Appears well-developed and well-nourished.  Psych: Affect blunted Eyes: No scleral injection.  HENT: No OP obstruction.  Head: Normocephalic.  Cardiovascular: Normal rate and regular rhythm.  Respiratory: Effort normal, non-labored breathing.  Skin: WDI.    Neurologic Examination   This exam was performed when patient was appearing to have active seizure  NEURO:  Mental Status: Patient is drowsy but opens eyes and cries out to sternal rub. Does not follow commands Speech: no intelligible speech CN: PERRL, EOMI (gaze deviation resolved), blink to threat bilat, face symmetric at rest, hearing intact to voice Motor & sensory: withdraws less briskly on the left that the R, +) oral automatisms Coordination: UTA 2/2 inability to follow commands  Labs and Diagnostic Imaging   CBC:  Recent Labs  Lab 06/16/24 1638 06/16/24 1647  WBC 8.2  --   NEUTROABS 3.0  --   HGB 12.4 12.6  HCT 38.0 37.0  MCV 95.5  --   PLT 175  --     Basic Metabolic Panel:  Lab Results  Component Value Date   NA 140 06/17/2024   K 3.7 06/17/2024   CO2 18 (L) 06/17/2024   GLUCOSE 98 06/17/2024    BUN 14 06/17/2024   CREATININE 0.94 06/17/2024   CALCIUM  8.5 (L) 06/17/2024   GFRNONAA >60 06/17/2024   GFRAA >90 10/15/2014   Lipid Panel:  Lab Results  Component Value Date   LDLCALC 74 06/17/2024   HgbA1c:  Lab Results  Component Value Date   HGBA1C 6.6 (H) 06/16/2024   Urine Drug Screen: No results found for: LABOPIA, COCAINSCRNUR, LABBENZ, AMPHETMU, THCU, LABBARB  Alcohol Level     Component Value Date/Time   ETH <15 06/16/2024 1641   INR  Lab Results  Component Value Date   INR 0.9 06/16/2024   APTT  Lab Results  Component Value Date   APTT 28 06/16/2024   AED levels: No results found for: PHENYTOIN, ZONISAMIDE, LAMOTRIGINE , LEVETIRACETA  CT Head without contrast(Personally reviewed): Small, age-indeterminate right cerebellar infarct   CT angio Head and Neck with perfusion (Personally reviewed): 1. The common carotid and internal carotid arteries are patent in the neck without stenosis. Mild atherosclerotic plaque bilaterally. 2. Vertebral arteries patent within the neck. Atherosclerotic plaque at both vertebral artery origins (mild stenosis on the right, suspected moderate stenosis on the left). 3. 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 4. Cervical spondylosis. 5. Aortic Atherosclerosis (ICD10-I70.0).   CTA head:   1. Focal occlusion versus severe near-occlusive  stenosis of the proximal right posterior inferior cerebellar artery (PICA), new from the prior CTA of 02/15/2021. 2. Background intracranial atherosclerotic disease as described within the body of the report. 3. At least moderate stenosis of the cavernous right internal carotid artery, unchanged. 4. New severe stenosis within a left middle cerebral artery proximal M2 branch. 5. Unchanged moderate stenosis within a left posterior cerebral artery branch at the P2/P3 junction.   CTP no perfusion deficit  MRI brain  wwo is pending  LTM EEG: pending   Assessment   MARIAFERNANDA HENDRICKSEN is a 63 y.o. female with hx of stroke with residual left-sided weakness, hypertension, hyperlipidemia, diastolic CHF, TIA, bipolar disorder, schizophrenia, right carotid stenosis, COPD, diabetes and GERD who presents with an episode of decreased responsiveness with oral automatisms and intermittent L gaze deviation from a family gathering on 7/19. The movements aborted with ativan  and she subsequently had weakness on her L side that was improving. She was loaded with keppra  and put on LTM EEG.  Today 7/20 she had a similar event involving decreased responsiveness, R gaze deviation per RN report, it had resolved by the time I arrived; note this was gaze deviation to the opposite side as yesterday). Event aborted with IV ativan .  During stroke code on arrival she was found to have an age-indeterminate R cerebellar stroke and a R PICA occlusion therefore we are also performing stroke workup. A cerebellar stroke however would not cause these types of seizures (her semiology would typically localize to the contralateral temporal lobe).  Recommendations   - Increase keppra  to 1000mg  bid - Continue LTM - Seizure precautions - Permissive HTN x48 hrs from sx onset or until stroke ruled out by MRI goal BP <220/110. PRN labetalol  or hydralazine  if BP above these parameters. Avoid oral antihypertensives. Avoid hypotension, ideally maintain SBP > 130 2/2 known intracranial occlusions - MRI brain wo contrast, pending - TTE w/ bubble - Check A1c and LDL + add statin per guidelines - ASA 300mg  PR now, further guidance to be provided tomorrow - NPO until RN dysphagia screen is passed and documented - q4 hr neuro checks - STAT head CT for any change in neuro exam - Tele - PT/OT/SLP - Stroke education - Amb referral to neurology upon discharge  - Neurology will continue to  follow ______________________________________________________________________   Signed, Elida CHRISTELLA Ross, MD [facility]

## 2024-06-17 NOTE — Progress Notes (Signed)
 OT Cancellation Note  Patient Details Name: Amber Harris MRN: [id number] DOB: [birth year]   Cancelled Treatment:    Reason Eval/Treat Not Completed: Patient not medically ready (Discussed with RN and NP, Pt currently seizing and not appropriate for therapy at this time. OT to follow-up wtih pt as able.)  06/17/2024  AB, OTR/L  Acute Rehabilitation Services  Office: [phone]   Amber Harris 06/17/2024, 12:19 PM

## 2024-06-17 NOTE — Progress Notes (Signed)
 LTM EEG hooked up and running - no initial skin breakdown - push button tested - [facility] monitoring.

## 2024-06-17 NOTE — Progress Notes (Signed)
 PT Cancellation Note  Patient Details Name: Amber Harris MRN: [id number] DOB: [birth year]   Cancelled Treatment:    Reason Eval/Treat Not Completed: Patient at procedure or test/unavailable (EEG)  Aleck Daring, PT, DPT Acute Rehabilitation Services Office [phone]    Alayne ONEIDA Daring 06/17/2024, 8:57 AM

## 2024-06-17 NOTE — Progress Notes (Signed)
EEG maint complete.  ?

## 2024-06-17 NOTE — Progress Notes (Signed)
 PROGRESS NOTE  Amber Harris FMW:[id number] DOB: Aug 23, 1961 DOA: 06/16/2024 PCP: Orpha Yancey LABOR, MD   LOS: 1 day   Brief Narrative / Interim history: 63 year old female with chronic diastolic CHF, carotid stenosis, prior CVA with mild residual left-sided weakness but independent unable to control her LDLs, COPD, insulin -dependent diabetes mellitus, HTN, HLD, comes to the hospital after being found to be staring off into space and unresponsive at a family gathering.  When EMS arrived, she was found to be dysarthric, left gaze deviation and left-sided weakness.  She was also found to be hypotensive.  She had some abnormal movements with this episodes, was given Ativan  and these stopped.  CT scan on admission showed a small infarct in the right cerebellar hemisphere, new from the 2022 MRI but age-indeterminate.  Neurology was consulted and she was admitted to the hospital.  Subjective / 24h Interval events: She is doing well this morning, feels back to baseline.  She is hooked up to the EEG.  She denies any chest pain, any shortness of breath, any abdominal discomfort  Assesement and Plan: Principal Problem:   Seizure (HCC) Active Problems:   Diabetes mellitus, type II (HCC)   Hypotension   Hypokalemia   AKI (acute kidney injury) (HCC)  Principal problem Seizure, possible acute CVA -neurology consulted and following.  CT scan showed possible CVA but age indeterminant.  Currently she is on continuous EEG monitoring, awaiting results, and when this is done she will undergo an MRI of the brain - She was started on Keppra  as per neurology - Complete stroke workup, CT showing severe near occlusive stenosis of the proximal right PICA, new from the CT angiogram in 2022, diffuse atherosclerotic disease, new severe stenosis within the left MCA proximal M2 branch.  Antiplatelets per neurology - LDL 74.  A1c 6.6.  PT/OT pending  Active problems Acute kidney injury-patient reports that she was  feeling very hot before passing out.  Creatinine 1.3, she was given IV fluids and now creatinine has normalized  Essential hypertension-allow permissive hypertension, she was hypotensive prior to coming here  Hyperlipidemia-on statin at home  Obesity, class III-BMI 42, she would benefit from weight loss  Insulin -dependent diabetes mellitus -A1c 6.6 as above.  She has an insulin  pump and a CGM monitor.  She was hypoglycemic this morning while n.p.o., she is now being allowed a diet  CBG (last 3)  Recent Labs    06/17/24 0527 06/17/24 0724 06/17/24 0814  GLUCAP 104* 50* 104*    Scheduled Meds:   stroke: early stages of recovery book   Does not apply Once   enoxaparin  (LOVENOX ) injection  40 mg Subcutaneous Q24H   insulin  aspart  0-9 Units Subcutaneous Q4H   levETIRAcetam   500 mg Intravenous Q12H   Continuous Infusions: PRN Meds:.acetaminophen  **OR** acetaminophen  (TYLENOL ) oral liquid 160 mg/5 mL **OR** acetaminophen , albuterol   Current Outpatient Medications  Medication Instructions   albuterol  (PROVENTIL ,VENTOLIN ) 90 MCG/ACT inhaler 2 puffs, Every 6 hours PRN   aspirin  EC 81 mg, Daily   chlorthalidone  (HYGROTON ) 50 mg, Daily   citalopram (CELEXA) 20 mg, Every morning   clopidogrel  (PLAVIX ) 75 mg, Every morning   Continuous Glucose Sensor (DEXCOM G7 SENSOR) MISC CHANGE EVERY 10 DAYS   ezetimibe  (ZETIA ) 10 mg, Oral, Daily   gabapentin  (NEURONTIN ) 300 mg, 3 times daily   hydrALAZINE  (APRESOLINE ) 50 mg, Oral, 2 times daily   Insulin  Disposable Pump (OMNIPOD DASH PODS, GEN 4,) MISC Change every 48 hours   insulin  lispro (HUMALOG)  100 UNIT/ML injection USE IN OMNIPOD. MAX 100 UNITS PER DAY *DISCONTINUE NOVOLOG *   Jardiance  25 mg, Daily   methocarbamol  (ROBAXIN ) 500 mg, Daily at bedtime   omeprazole  (PRILOSEC) 20 mg, Oral, Daily   risperiDONE  (RISPERDAL ) 0.5 mg, Daily at bedtime   rosuvastatin  (CRESTOR ) 40 mg, Oral, Daily at bedtime    Diet Orders (From admission, onward)      Start     Ordered   06/17/24 0801  Diet Heart Room service appropriate? Yes; Fluid consistency: Thin  Diet effective now       Question Answer Comment  Room service appropriate? Yes   Fluid consistency: Thin      06/17/24 0800            DVT prophylaxis: enoxaparin  (LOVENOX ) injection 40 mg Start: 06/16/24 2200   Lab Results  Component Value Date   PLT 175 06/16/2024      Code Status: Full Code  Family Communication: no family at bedside   Status is: Inpatient Remains inpatient appropriate because: LTM EEG, MRI pending  Level of care: Progressive  Consultants:  Neurology   Objective: Vitals:   06/16/24 2056 06/16/24 2306 06/17/24 0442 06/17/24 0732  BP: 97/69 124/73 120/74 125/77  Pulse: (!) 55 (!) 48 (!) 44 (!) 43  Resp:  17 18 15   Temp:  98.4 F (36.9 C) (!) 97.4 F (36.3 C) 97.6 F (36.4 C)  TempSrc:  Oral Axillary Oral  SpO2: 100% 100% 100% 100%  Weight:        Intake/Output Summary (Last 24 hours) at 06/17/2024 1034 Last data filed at 06/17/2024 0500 Gross per 24 hour  Intake 910 ml  Output 1100 ml  Net -190 ml   Wt Readings from Last 3 Encounters:  06/16/24 126.4 kg  02/14/24 126.4 kg  02/09/24 126.4 kg    Examination:  Constitutional: NAD Eyes: no scleral icterus ENMT: Mucous membranes are moist.  Neck: normal, supple Respiratory: clear to auscultation bilaterally, no wheezing, no crackles. Normal respiratory effort. No accessory muscle use.  Cardiovascular: Regular rate and rhythm, no murmurs / rubs / gallops. No LE edema.  Abdomen: non distended, no tenderness. Bowel sounds positive.  Musculoskeletal: no clubbing / cyanosis.   Data Reviewed: I have independently reviewed following labs and imaging studies   CBC Recent Labs  Lab 06/16/24 1638 06/16/24 1647  WBC 8.2  --   HGB 12.4 12.6  HCT 38.0 37.0  PLT 175  --   MCV 95.5  --   MCH 31.2  --   MCHC 32.6  --   RDW 12.9  --   LYMPHSABS 4.3*  --   MONOABS 0.7  --    EOSABS 0.0  --   BASOSABS 0.1  --     Recent Labs  Lab 06/16/24 1638 06/16/24 1647 06/16/24 2213 06/17/24 0529  NA 137 140  --  140  K 3.0* 3.0*  --  3.7  CL 104 104  --  109  CO2 21*  --   --  18*  GLUCOSE 132* 126*  --  98  BUN 21 23  --  14  CREATININE 1.34* 1.40*  --  0.94  CALCIUM  8.5*  --   --  8.5*  AST 31  --   --   --   ALT 23  --   --   --   ALKPHOS 50  --   --   --   BILITOT 1.1  --   --   --  ALBUMIN 2.9*  --   --   --   MG  --   --   --  2.0  INR 0.9  --   --   --   TSH  --   --   --  0.785  HGBA1C  --   --  6.6*  --   BNP  --   --   --  72.0    ------------------------------------------------------------------------------------------------------------------ Recent Labs    06/17/24 0529  CHOL 148  HDL 61  LDLCALC 74  TRIG 65  CHOLHDL 2.4    Lab Results  Component Value Date   HGBA1C 6.6 (H) 06/16/2024   ------------------------------------------------------------------------------------------------------------------ Recent Labs    06/17/24 0529  TSH 0.785    Cardiac Enzymes No results for input(s): CKMB, TROPONINI, MYOGLOBIN in the last 168 hours.  Invalid input(s): CK ------------------------------------------------------------------------------------------------------------------    Component Value Date/Time   BNP 72.0 06/17/2024 0529   BNP 9.0 05/10/2011 1503    CBG: Recent Labs  Lab 06/16/24 2312 06/17/24 0434 06/17/24 0527 06/17/24 0724 06/17/24 0814  GLUCAP 130* 62* 104* 50* 104*    No results found for this or any previous visit (from the past 240 hours).   Radiology Studies: CT ANGIO HEAD NECK W WO CM W PERF (CODE STROKE) Addendum Date: 06/16/2024 ADDENDUM REPORT: 06/16/2024 18:38 ADDENDUM: CTA head impressions #1 and #4 called by telephone on 06/16/2024 at 6:08 pm to provider [facility] , who verbally acknowledged these results. Negative perfusion results were also discussed at this time. Electronically  Signed   By: Rockey Childs D.O.   On: 06/16/2024 18:38   Result Date: 06/16/2024 CLINICAL DATA:  Provided history: Neuro deficit, acute, stroke suspected. EXAM: CT ANGIOGRAPHY HEAD AND NECK CT PERFUSION BRAIN TECHNIQUE: Multidetector CT imaging of the head and neck was performed using the standard protocol during bolus administration of intravenous contrast. Multiplanar CT image reconstructions and MIPs were obtained to evaluate the vascular anatomy. Carotid stenosis measurements (when applicable) are obtained utilizing NASCET criteria, using the distal internal carotid diameter as the denominator. Multiphase CT imaging of the brain was performed following IV bolus contrast injection. Subsequent parametric perfusion maps were calculated using RAPID software. RADIATION DOSE REDUCTION: This exam was performed according to the departmental dose-optimization program which includes automated exposure control, adjustment of the mA and/or kV according to patient size and/or use of iterative reconstruction technique. CONTRAST:  75mL OMNIPAQUE  IOHEXOL  350 MG/ML SOLN COMPARISON:  Non-contrast head CT performed earlier today 06/16/2024. CT angiogram head/neck and CT perfusion head 02/15/2021. FINDINGS: CTA NECK FINDINGS Aortic arch: Standard aortic branching. Atherosclerotic plaque within the visualized aortic arch and proximal major branch vessels of the neck. No hemodynamically significant innominate or proximal subclavian artery stenosis. Right carotid system: CCA and ICA patent within the neck without stenosis. Mild atherosclerotic plaque scattered within the CCA and about the carotid bifurcation. Left carotid system: CCA and ICA patent within the neck without stenosis. Mild sclerotic plaque about the carotid bifurcation. Vertebral arteries: Codominant and patent within the neck. Atherosclerotic plaque at the right vertebral artery origin resulting in mild stenosis. Atherosclerotic plaque at the left vertebral artery  origin resulting in a suspected moderate stenosis Skeleton: Cervical spondylosis. Multilevel cervical ventrolateral osteophytes, some bridging. No acute fracture or aggressive osseous lesion. Other neck: 6.0 x 3.7 cm lipoma within the left neck (superficial to the left sternocleidomastoid muscle). Upper chest: No consolidation within the imaged lung apices. Review of the MIP images confirms the above findings CTA HEAD  FINDINGS Anterior circulation: The intracranial internal carotid arteries are patent. Atherosclerotic plaque within both vessels. Calcified plaque results in at least moderate stenosis of the cavernous segment on the right. No more than mild stenosis elsewhere within the intracranial ICAs. The M1 middle cerebral arteries are patent. Atherosclerotic irregularity of the M2 and more distal MCA vessels bilaterally. Most notably, there is a new severe stenosis within a superior division proximal left MCA M2 branch (series 8, image 26). The anterior cerebral arteries are patent. No intracranial aneurysm is identified. Posterior circulation: The intracranial vertebral arteries are patent. New focal occlusion versus severe near-occlusive stenosis of the proximal right PICA. The basilar artery is patent. The posterior cerebral arteries are patent. Atherosclerotic irregularity of both vessels. Most notably, there is an unchanged moderate stenosis within a left PCA branch at the P2/P3 junction. A small left posterior communicating artery is present. The right posterior communicating artery is diminutive or absent Venous sinuses: Within the limitations of contrast timing, no convincing thrombus. Anatomic variants: As described. Review of the MIP images confirms the above findings CT Brain Perfusion Findings: CBF (<30%) Volume: 0mL Perfusion (Tmax>6.0s) volume: 0mL Mismatch Volume: 0mL Infarction Location:None identified. Attempts are being made to reach the ordering provider at this time. IMPRESSION: CTA neck: 1.  The common carotid and internal carotid arteries are patent in the neck without stenosis. Mild atherosclerotic plaque bilaterally. 2. Vertebral arteries patent within the neck. Atherosclerotic plaque at both vertebral artery origins (mild stenosis on the right, suspected moderate stenosis on the left). 3. 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 4. Cervical spondylosis. 5. Aortic Atherosclerosis (ICD10-I70.0). CTA head: 1. Focal occlusion versus severe near-occlusive stenosis of the proximal right posterior inferior cerebellar artery (PICA), new from the prior CTA of 02/15/2021. 2. Background intracranial atherosclerotic disease as described within the body of the report. 3. At least moderate stenosis of the cavernous right internal carotid artery, unchanged. 4. New severe stenosis within a left middle cerebral artery proximal M2 branch. 5. Unchanged moderate stenosis within a left posterior cerebral artery branch at the P2/P3 junction. Electronically Signed: By: Rockey Childs D.O. On: 06/16/2024 17:54   CT Angio Chest/Abd/Pel for Dissection W and/or Wo Contrast Result Date: 06/16/2024 CLINICAL DATA:  Provided history: hypotension, neuro deficits EXAM: CT ANGIOGRAPHY CHEST, ABDOMEN AND PELVIS TECHNIQUE: Non-contrast CT of the chest was initially obtained. Multidetector CT imaging through the chest, abdomen and pelvis was performed using the standard protocol during bolus administration of intravenous contrast. Multiplanar reconstructed images and MIPs were obtained and reviewed to evaluate the vascular anatomy. RADIATION DOSE REDUCTION: This exam was performed according to the departmental dose-optimization program which includes automated exposure control, adjustment of the mA and/or kV according to patient size and/or use of iterative reconstruction technique. CONTRAST:  80mL OMNIPAQUE  IOHEXOL  350 MG/ML SOLN COMPARISON:  Report from chest CT 12/27/2021,  abdominal CT 09/03/2021. Images not available. FINDINGS: CTA CHEST FINDINGS Cardiovascular: No aortic hematoma on unenhanced exam. The left superior intercostal vein courses over the aortic arch. Mild aortic atherosclerosis. No aneurysm, dissection or acute aortic finding. Prominent main pulmonary artery at 3.2 cm. There is no central pulmonary embolus to the segmental level. The heart is enlarged. Small pericardial effusion. Mediastinum/Nodes: No mediastinal or hilar adenopathy. No enlarged axillary lymph nodes. Patulous esophagus. Lungs/Pleura: Dependent hypoventilatory changes. Breathing motion artifact limits detailed assessment. No pleural fluid. No features of pulmonary edema. Trachea and central airways are clear. Musculoskeletal: Diffuse thoracic spondylosis with  anterior spurring. There are no acute or suspicious osseous abnormalities. Review of the MIP images confirms the above findings. CTA ABDOMEN AND PELVIS FINDINGS VASCULAR Aorta: Normal caliber aorta without aneurysm, dissection, vasculitis or significant stenosis. Moderate calcified atheromatous plaque. Celiac: Patent without evidence of aneurysm, dissection, vasculitis or significant stenosis. SMA: Patent without evidence of aneurysm, dissection, vasculitis or significant stenosis. Replaced right hepatic artery arises from the SMA. Mixed calcified and noncalcified atheromatous plaque. Renals: Plaque at the origin of both renal arteries. Distal renal arteries are patent. No severe stenosis, evidence of vasculitis or FMD. IMA: Small in caliber but patent. Inflow: Patent without evidence of aneurysm, dissection, vasculitis or significant stenosis. Moderate atheromatous plaque. Veins: No obvious venous abnormality within the limitations of this arterial phase study. Review of the MIP images confirms the above findings. NON-VASCULAR Hepatobiliary: The liver is enlarged spanning 19.2 cm cranial caudal. No evidence of focal liver lesion on this arterial  phase exam. Gallbladder physiologically distended, no calcified stone. No biliary dilatation. Pancreas: No ductal dilatation or inflammation. Spleen: Normal in size and arterial enhancement. Adrenals/Urinary Tract: 12 mm left adrenal nodule was described on prior exam, needing no further imaging follow-up. Excretion of IV contrast in both renal collecting systems from neck CTA earlier today. No hydronephrosis or evidence of renal inflammation. Mild bladder wall thickening. Stomach/Bowel: No bowel obstruction or inflammation. Normal appendix. Moderate to large volume of stool in the colon. Lymphatic: Enlarged lymph nodes in the abdomen or pelvis. Reproductive: Status post hysterectomy. No adnexal masses. Other: No free air or ascites. Postsurgical change of the lower anterior abdominal wall. Musculoskeletal: Lower lumbar facet hypertrophy. Bilateral hip arthropathy. There are no acute or suspicious osseous abnormalities. Review of the MIP images confirms the above findings. IMPRESSION: 1. No aortic dissection or acute aortic abnormality. 2. Cardiomegaly with small pericardial effusion. 3. Prominent main pulmonary artery, can be seen with pulmonary arterial hypertension. 4. Mild bladder wall thickening, recommend correlation with urinalysis. 5. Moderate to large volume of stool in the colon, can be seen with constipation. Aortic Atherosclerosis (ICD10-I70.0). Electronically Signed   By: Andrea Gasman M.D.   On: 06/16/2024 18:17   CT HEAD CODE STROKE WO CONTRAST Result Date: 06/16/2024 CLINICAL DATA:  Code stroke. Neuro deficit, acute, stroke suspected. EXAM: CT HEAD WITHOUT CONTRAST TECHNIQUE: Contiguous axial images were obtained from the base of the skull through the vertex without intravenous contrast. RADIATION DOSE REDUCTION: This exam was performed according to the departmental dose-optimization program which includes automated exposure control, adjustment of the mA and/or kV according to patient size  and/or use of iterative reconstruction technique. COMPARISON:  Brain MRI 02/16/2021. FINDINGS: Brain: Mild generalized cerebral atrophy. The cerebral white matter disease described on the prior brain MRI of 02/16/2021 is occult by CT. Small infarct within the right cerebellar hemisphere, new from the prior brain MRI of 02/16/2021 but otherwise age-indeterminate. Partially empty sella turcica. There is no acute intracranial hemorrhage. No demarcated cortical infarct. No extra-axial fluid collection. No evidence of an intracranial mass. No midline shift. Vascular: No hyperdense vessel. Atherosclerotic calcifications. Skull: No calvarial fracture or aggressive osseous lesion. Sinuses/Orbits: No mass or acute finding within the imaged orbits. No significant paranasal sinus disease at the imaged levels. ASPECTS [facility] Stroke Program Early CT Score) - Ganglionic level infarction (caudate, lentiform nuclei, internal capsule, insula, M1-M3 cortex): 7 - Supraganglionic infarction (M4-M6 cortex): 3 Total score (0-10 with 10 being normal): 10 Impression #1 communicated to Dr. Matthews at 5:08 pmon 7/19/2025by text page via the  AMION messaging system. IMPRESSION: 1. Small infarct within the right cerebellar hemisphere, new from the prior brain MRI of 02/16/2021 but otherwise age-indeterminate. Consider a brain MRI for further evaluation. 2. No acute intracranial hemorrhage or acute demarcated cortical infarct. Electronically Signed   By: Rockey Childs D.O.   On: 06/16/2024 17:10     Nilda Fendt, MD, PhD [facility]  Between 7 am - 7 pm I am available, please contact me via Amion (for emergencies) or Securechat (non urgent messages)  Between 7 pm - 7 am I am not available, please contact night coverage MD/APP via Amion

## 2024-06-17 NOTE — Plan of Care (Signed)
  Problem: Clinical Measurements: Goal: Ability to maintain clinical measurements within normal limits will improve Outcome: Progressing Goal: Will remain free from infection Outcome: Progressing Goal: Diagnostic test results will improve Outcome: Progressing Goal: Respiratory complications will improve Outcome: Progressing Goal: Cardiovascular complication will be avoided Outcome: Progressing   Problem: Coping: Goal: Level of anxiety will decrease Outcome: Progressing   Problem: Elimination: Goal: Will not experience complications related to bowel motility Outcome: Progressing Goal: Will not experience complications related to urinary retention Outcome: Progressing   Problem: Pain Managment: Goal: General experience of comfort will improve and/or be controlled Outcome: Progressing   Problem: Safety: Goal: Ability to remain free from injury will improve Outcome: Progressing   Problem: Skin Integrity: Goal: Risk for impaired skin integrity will decrease Outcome: Progressing   Problem: Fluid Volume: Goal: Ability to maintain a balanced intake and output will improve Outcome: Progressing   Problem: Metabolic: Goal: Ability to maintain appropriate glucose levels will improve Outcome: Progressing   Problem: Skin Integrity: Goal: Risk for impaired skin integrity will decrease Outcome: Progressing   Problem: Ischemic Stroke/TIA Tissue Perfusion: Goal: Complications of ischemic stroke/TIA will be minimized Outcome: Progressing   Problem: Nutrition: Goal: Risk of aspiration will decrease Outcome: Progressing   Problem: Education: Goal: Knowledge of General Education information will improve Description: Including pain rating scale, medication(s)/side effects and non-pharmacologic comfort measures Outcome: Not Progressing   Problem: Health Behavior/Discharge Planning: Goal: Ability to manage health-related needs will improve Outcome: Not Progressing   Problem:  Activity: Goal: Risk for activity intolerance will decrease Outcome: Not Progressing   Problem: Nutrition: Goal: Adequate nutrition will be maintained Outcome: Not Progressing   Problem: Education: Goal: Ability to describe self-care measures that may prevent or decrease complications (Diabetes Survival Skills Education) will improve Outcome: Not Progressing Goal: Individualized Educational Video(s) Outcome: Not Progressing   Problem: Coping: Goal: Ability to adjust to condition or change in health will improve Outcome: Not Progressing   Problem: Health Behavior/Discharge Planning: Goal: Ability to identify and utilize available resources and services will improve Outcome: Not Progressing Goal: Ability to manage health-related needs will improve Outcome: Not Progressing   Problem: Nutritional: Goal: Maintenance of adequate nutrition will improve Outcome: Not Progressing Goal: Progress toward achieving an optimal weight will improve Outcome: Not Progressing   Problem: Education: Goal: Knowledge of disease or condition will improve Outcome: Not Progressing Goal: Knowledge of secondary prevention will improve (MUST DOCUMENT ALL) Outcome: Not Progressing Goal: Knowledge of patient specific risk factors will improve (DELETE if not current risk factor) Outcome: Not Progressing   Problem: Coping: Goal: Will verbalize positive feelings about self Outcome: Not Progressing Goal: Will identify appropriate support needs Outcome: Not Progressing   Problem: Health Behavior/Discharge Planning: Goal: Ability to manage health-related needs will improve Outcome: Not Progressing Goal: Goals will be collaboratively established with patient/family Outcome: Not Progressing   Problem: Self-Care: Goal: Ability to participate in self-care as condition permits will improve Outcome: Not Progressing Goal: Verbalization of feelings and concerns over difficulty with self-care will  improve Outcome: Not Progressing Goal: Ability to communicate needs accurately will improve Outcome: Not Progressing   Problem: Nutrition: Goal: Dietary intake will improve Outcome: Not Progressing

## 2024-06-17 NOTE — Plan of Care (Addendum)
 RN reported that patient heart rate dropped to 30 to 40s range.  Reviewed telemetry strip which showing heart rate is around 50s range and sinus bradycardia without any evidence of AV block..  EKG obtained which shows sinus bradycardia heart rate 47. Per chart review when patient blood pressure was in lower range systolic 90s heart rate in 60s range as blood pressure has been improved to upper 120s heart rate ranging between 44-48.   At baseline patient has bradycardia and patient is asymptomatic. Continue to monitor.  Malyah Ohlrich, MD [facility] 06/17/2024, 5:25 AM

## 2024-06-18 ENCOUNTER — Encounter (HOSPITAL_COMMUNITY)

## 2024-06-18 DIAGNOSIS — I959 Hypotension, unspecified: Secondary | ICD-10-CM

## 2024-06-18 DIAGNOSIS — I69354 Hemiplegia and hemiparesis following cerebral infarction affecting left non-dominant side: Secondary | ICD-10-CM

## 2024-06-18 DIAGNOSIS — R569 Unspecified convulsions: Secondary | ICD-10-CM | POA: Diagnosis not present

## 2024-06-18 LAB — GLUCOSE, CAPILLARY
Glucose-Capillary: 104 mg/dL — ABNORMAL HIGH (ref 70–99)
Glucose-Capillary: 104 mg/dL — ABNORMAL HIGH (ref 70–99)
Glucose-Capillary: 125 mg/dL — ABNORMAL HIGH (ref 70–99)
Glucose-Capillary: 127 mg/dL — ABNORMAL HIGH (ref 70–99)
Glucose-Capillary: 130 mg/dL — ABNORMAL HIGH (ref 70–99)
Glucose-Capillary: 138 mg/dL — ABNORMAL HIGH (ref 70–99)
Glucose-Capillary: 187 mg/dL — ABNORMAL HIGH (ref 70–99)
Glucose-Capillary: 68 mg/dL — ABNORMAL LOW (ref 70–99)

## 2024-06-18 MED ORDER — MIDAZOLAM HCL 2 MG/2ML IJ SOLN: 1.0000 mg | INTRAMUSCULAR | Status: DC | PRN

## 2024-06-18 MED ORDER — CLOPIDOGREL BISULFATE 75 MG PO TABS
75.0000 mg | ORAL_TABLET | Freq: Every day | ORAL | Status: DC
  Administered 2024-06-18 – 2024-06-19 (×2): 75 mg via ORAL
  Filled 2024-06-18 (×2): qty 1

## 2024-06-18 NOTE — Plan of Care (Signed)
  Problem: Health Behavior/Discharge Planning: Goal: Ability to manage health-related needs will improve Outcome: Progressing   Problem: Clinical Measurements: Goal: Ability to maintain clinical measurements within normal limits will improve Outcome: Progressing Goal: Will remain free from infection Outcome: Progressing Goal: Diagnostic test results will improve Outcome: Progressing Goal: Respiratory complications will improve Outcome: Progressing Goal: Cardiovascular complication will be avoided Outcome: Progressing   Problem: Activity: Goal: Risk for activity intolerance will decrease Outcome: Progressing   Problem: Elimination: Goal: Will not experience complications related to bowel motility Outcome: Progressing Goal: Will not experience complications related to urinary retention Outcome: Progressing   Problem: Pain Managment: Goal: General experience of comfort will improve and/or be controlled Outcome: Progressing   Problem: Safety: Goal: Ability to remain free from injury will improve Outcome: Progressing   Problem: Skin Integrity: Goal: Risk for impaired skin integrity will decrease Outcome: Progressing   Problem: Metabolic: Goal: Ability to maintain appropriate glucose levels will improve Outcome: Progressing   Problem: Nutritional: Goal: Maintenance of adequate nutrition will improve Outcome: Progressing Goal: Progress toward achieving an optimal weight will improve Outcome: Progressing   Problem: Skin Integrity: Goal: Risk for impaired skin integrity will decrease Outcome: Progressing   Problem: Education: Goal: Knowledge of disease or condition will improve Outcome: Progressing Goal: Knowledge of secondary prevention will improve (MUST DOCUMENT ALL) Outcome: Progressing Goal: Knowledge of patient specific risk factors will improve (DELETE if not current risk factor) Outcome: Progressing   Problem: Ischemic Stroke/TIA Tissue Perfusion: Goal:  Complications of ischemic stroke/TIA will be minimized Outcome: Progressing   Problem: Health Behavior/Discharge Planning: Goal: Ability to manage health-related needs will improve Outcome: Progressing Goal: Goals will be collaboratively established with patient/family Outcome: Progressing   Problem: Self-Care: Goal: Ability to participate in self-care as condition permits will improve Outcome: Progressing Goal: Verbalization of feelings and concerns over difficulty with self-care will improve Outcome: Progressing Goal: Ability to communicate needs accurately will improve Outcome: Progressing

## 2024-06-18 NOTE — Evaluation (Signed)
 Physical Therapy Evaluation Patient Details Name: Amber Harris MRN: [id number] DOB: [birth year] Today's Date: 06/18/2024  History of Present Illness  Pt is a 63 yo female presenting to [facility] ED on 06/16/24 after being found unresponsive, further workup for seizures. CT head showing a small infarct in the R cerebral hemisphere. PMH of chronic HFpEF, carotid stenosis, CVA with residual left-sided weakness, COPD, depression/anxiety, insulin -dependent type 2 diabetes, GERD, hypertension, hyperlipidemia, class III obesity  Clinical Impression  PTA, pt lives alone and has a HH aide M-F 9-3, is modI with RW, and requires assist for ADL's. Pt presents with cognitive impairments, impaired standing balance, decreased L sided coordination. Pt requiring minimal assist for bed mobility and standing. Able to tolerate static standing for extended period for linen change and posterior peri care. Pt taking a couple side steps at edge of bed with increased time. Unable to progress gait further due to EEG. Currently, recommend continued inpatient follow up therapy, <3 hours/day, however, pt has potential to progress and will update if appropriate.       If plan is discharge home, recommend the following: A little help with walking and/or transfers;A little help with bathing/dressing/bathroom;Assistance with cooking/housework;Assist for transportation;Help with stairs or ramp for entrance   Can travel by private vehicle   Yes    Equipment Recommendations None recommended by PT  Recommendations for Other Services       Functional Status Assessment Patient has had a recent decline in their functional status and demonstrates the ability to make significant improvements in function in a reasonable and predictable amount of time.     Precautions / Restrictions Precautions Precautions: Fall Recall of Precautions/Restrictions: Intact Precaution/Restrictions Comments: LTM EEG Restrictions Weight Bearing  Restrictions Per Provider Order: No      Mobility  Bed Mobility Overal bed mobility: Needs Assistance Bed Mobility: Supine to Sit, Sit to Supine     Supine to sit: Contact guard Sit to supine: Min assist   General bed mobility comments: LE assist back into bed, good effort    Transfers Overall transfer level: Needs assistance Equipment used: Rolling walker (2 wheels) Transfers: Sit to/from Stand Sit to Stand: Min assist           General transfer comment: MinA to rise and steady from edge of bed. Able to take lateral steps with increased time    Ambulation/Gait               General Gait Details: deferred due to EEG  Stairs            Wheelchair Mobility     Tilt Bed    Modified Rankin (Stroke Patients Only) Modified Rankin (Stroke Patients Only) Pre-Morbid Rankin Score: Moderate disability Modified Rankin: Moderately severe disability     Balance Overall balance assessment: Needs assistance Sitting-balance support: No upper extremity supported, Feet supported Sitting balance-Leahy Scale: Fair     Standing balance support: Reliant on assistive device for balance, Bilateral upper extremity supported, During functional activity Standing balance-Leahy Scale: Poor                               Pertinent Vitals/Pain Pain Assessment Pain Assessment: No/denies pain    Home Living Family/patient expects to be discharged to:: Private residence Living Arrangements: Alone Available Help at Discharge: Personal care attendant Type of Home: Apartment Home Access: Stairs to enter Entrance Stairs-Rails: None Entrance Stairs-Number of Steps: 4   Home Layout:  One level Home Equipment: Pharmacist, hospital (2 wheels);Cane - single point Additional Comments: Pt reports she has an aide 9-3p Mon thru Fri.    Prior Function Prior Level of Function : Needs assist             Mobility Comments: mod I with SPC and RW. More use of RW  whan amb around home ADLs Comments: aide helps wtih bathing and dressing.     Extremity/Trunk Assessment   Upper Extremity Assessment Upper Extremity Assessment: Defer to OT evaluation    Lower Extremity Assessment Lower Extremity Assessment: LLE deficits/detail LLE Coordination: decreased gross motor    Cervical / Trunk Assessment Cervical / Trunk Assessment: Other exceptions Cervical / Trunk Exceptions: increased body habitus.  Communication   Communication Communication: No apparent difficulties Factors Affecting Communication: Hearing impaired    Cognition Arousal: Alert Behavior During Therapy: WFL for tasks assessed/performed   PT - Cognitive impairments: Awareness, Memory, Attention                       PT - Cognition Comments: Not aware of day of week, asks about family reunion Following commands: Impaired Following commands impaired: Follows one step commands with increased time     Cueing Cueing Techniques: Verbal cues     General Comments General comments (skin integrity, edema, etc.): VSS RA.    Exercises     Assessment/Plan    PT Assessment Patient needs continued PT services  PT Problem List Decreased strength;Decreased balance;Decreased mobility;Decreased coordination;Decreased cognition;Decreased safety awareness       PT Treatment Interventions DME instruction;Gait training;Stair training;Functional mobility training;Therapeutic activities;Therapeutic exercise;Balance training;Patient/family education    PT Goals (Current goals can be found in the Care Plan section)  Acute Rehab PT Goals Patient Stated Goal: did not state PT Goal Formulation: With patient Time For Goal Achievement: 07/02/24 Potential to Achieve Goals: Good    Frequency Min 3X/week     Co-evaluation               AM-PAC PT 6 Clicks Mobility  Outcome Measure Help needed turning from your back to your side while in a flat bed without using bedrails?: A  Little Help needed moving from lying on your back to sitting on the side of a flat bed without using bedrails?: A Little Help needed moving to and from a bed to a chair (including a wheelchair)?: A Little Help needed standing up from a chair using your arms (e.g., wheelchair or bedside chair)?: A Little Help needed to walk in hospital room?: A Lot Help needed climbing 3-5 steps with a railing? : A Lot 6 Click Score: 16    End of Session Equipment Utilized During Treatment: Gait belt Activity Tolerance: Patient tolerated treatment well Patient left: in bed;with call bell/phone within reach;with bed alarm set Nurse Communication: Mobility status PT Visit Diagnosis: Unsteadiness on feet (R26.81);Other abnormalities of gait and mobility (R26.89);Difficulty in walking, not elsewhere classified (R26.2)    Time: 8473-8451 PT Time Calculation (min) (ACUTE ONLY): 22 min   Charges:   PT Evaluation $PT Eval Moderate Complexity: 1 Mod   PT General Charges $$ ACUTE PT VISIT: 1 Visit         Aleck Daring, PT, DPT Acute Rehabilitation Services Office [phone]   Alayne ONEIDA Daring 06/18/2024, 4:20 PM

## 2024-06-18 NOTE — Inpatient Diabetes Management (Signed)
 Inpatient Diabetes Program Recommendations  AACE/ADA: New Consensus Statement on Inpatient Glycemic Control (2015)  Target Ranges:  Prepandial:   less than 140 mg/dL      Peak postprandial:   less than 180 mg/dL (1-2 hours)      Critically ill patients:  140 - 180 mg/dL   Lab Results  Component Value Date   GLUCAP 104 (H) 06/18/2024   HGBA1C 6.6 (H) 06/16/2024    Review of Glycemic Control  Latest Reference Range & Units 06/17/24 23:44 06/18/24 00:19 06/18/24 04:08 06/18/24 08:22 06/18/24 08:52  Glucose-Capillary 70 - 99 mg/dL 68 (L) 895 (H) 872 (H) 68 (L) 104 (H)  (L): Data is abnormally low (H): Data is abnormally high Diabetes history: Type 2 DM Outpatient Diabetes medications: Omnipod- Humalog Current orders for Inpatient glycemic control: Novolog  0-9 units Q4H D10% ordered  Inpatient Diabetes Program Recommendations:    Noted hypoglycemia without administration of insulin , however, in MD notes patient wearing insulin  pump. Secure chat sent to RN to verify insulin  pump applied.   Spoke with patient and verified insulin  pump placement. Currently, infusing insulin  at 1 units/hr with Humalog. She is followed by PCP who recently applied pump for compliance. Her daily aide through [place] [facility] home [facility] helps patient with insulin  pump application.  Reviewed patient's current A1c of 6.6%. Explained what a A1c is and what it measures. Also reviewed goal A1c with patient, importance of good glucose control @ home, and blood sugar goals.  Reviewed Dexcom PDM, patient is not having hypoglycemia. Looks to be within range ~ 60% of the time. Boluses with meals (07/09/11 depending on meal size).  Does not have additional supplies at bedside. Patient's aide is not at bedside.  Gained additional information by talking with aide via phone.   At this time, patient is not appropriate for insulin  pump management.   At this time consider: -Semglee  10 units every day (starting at 1200) -Discontinue  insulin  pump now Secure chat sent to MD.    Current insulin  pump settings are as follows:  Basal insulin   12A 1.0 units/hour Total daily basal insulin : 24 units/24 hours    Patient agreeable to using SQ insulin . Explained basal duration and advised patient that if she is discharged and resumes her insulin  pump, she will not want to resume her basal insulin  until 24 hours after the Lantus  is given.Patient and aide verbalized understanding of information discussed and states that he does not have any further questions related to diabetes at this time.   Thanks, Tinnie Minus, MSN, RNC-OB Diabetes Coordinator [phone] (8a-5p)

## 2024-06-18 NOTE — Evaluation (Signed)
 Occupational Therapy Evaluation Patient Details Name: Amber Harris MRN: [id number] DOB: [birth year] Today's Date: 06/18/2024   History of Present Illness   Pt is a 63 yo female presenting to [facility] ED on 06/16/24 after being found unresponsive, further workup for seizures. CT head showing a small infarct in the R cerebral hemisphere. PMH of chronic HFpEF, carotid stenosis, CVA with residual left-sided weakness, COPD, depression/anxiety, insulin -dependent type 2 diabetes, GERD, hypertension, hyperlipidemia, class III obesity     Clinical Impressions Pt admitted for above, PTA pt reports living alone but has a HH aide assist M-F 9-3pm and the aide would assist her with bathing/dressing but pt mod I with RW in her home. Pt currently presenting with some cognitive delays/processing challenges but A&Ox4. Pt needing fair standing assist and min A to pivot with RW, also requires mod A to setup A for ADLs. Pt would benefit from continued acute skilled OT services to address listed deficits and help transition to next level of care. Patient will benefit from continued inpatient follow up therapy, <3 hours/day, could flip pending progress.      If plan is discharge home, recommend the following:   A little help with walking and/or transfers;Assistance with cooking/housework;A little help with bathing/dressing/bathroom;Supervision due to cognitive status     Functional Status Assessment   Patient has had a recent decline in their functional status and demonstrates the ability to make significant improvements in function in a reasonable and predictable amount of time.     Equipment Recommendations   None recommended by OT (Pt has rec DME)     Recommendations for Other Services         Precautions/Restrictions   Precautions Precautions: Fall Recall of Precautions/Restrictions: Intact Precaution/Restrictions Comments: LTM EEG Restrictions Weight Bearing Restrictions Per Provider Order:  No     Mobility Bed Mobility Overal bed mobility: Needs Assistance Bed Mobility: Supine to Sit, Sit to Supine     Supine to sit: Contact guard, Used rails, HOB elevated Sit to supine: Min assist, Used rails, HOB elevated        Transfers Overall transfer level: Needs assistance Equipment used: Rolling walker (2 wheels) Transfers: Sit to/from Stand, Bed to chair/wheelchair/BSC Sit to Stand: Min assist Stand pivot transfers: Min assist                Balance Overall balance assessment: Needs assistance Sitting-balance support: No upper extremity supported, Feet supported Sitting balance-Leahy Scale: Fair Sitting balance - Comments: Slight sway with EOB sitting.   Standing balance support: Reliant on assistive device for balance, Bilateral upper extremity supported, During functional activity Standing balance-Leahy Scale: Poor                             ADL either performed or assessed with clinical judgement   ADL Overall ADL's : Needs assistance/impaired Eating/Feeding: Set up;Bed level   Grooming: Sitting;Set up;Wash/dry face Grooming Details (indicate cue type and reason): inc time. Upper Body Bathing: Sitting;Set up   Lower Body Bathing: Moderate assistance;Sitting/lateral leans   Upper Body Dressing : Sitting;Minimal assistance   Lower Body Dressing: Sit to/from stand;Sitting/lateral leans;Moderate assistance   Toilet Transfer: Minimal assistance;BSC/3in1;Stand-pivot;Rolling walker (2 wheels) Toilet Transfer Details (indicate cue type and reason): inc time for pt to process cues. Cues needed for hand placement. Toileting- Clothing Manipulation and Hygiene: Sit to/from stand;Maximal assistance Toileting - Clothing Manipulation Details (indicate cue type and reason): rear pericare     Functional  mobility during ADLs: Rolling walker (2 wheels);Minimal assistance (pivot) General ADL Comments: Pt on LTM EEG.     Vision   Vision Assessment?:  No apparent visual deficits     Perception         Praxis         Pertinent Vitals/Pain Pain Assessment Pain Assessment: No/denies pain     Extremity/Trunk Assessment Upper Extremity Assessment Upper Extremity Assessment: Overall WFL for tasks assessed       Cervical / Trunk Assessment Cervical / Trunk Assessment: Other exceptions Cervical / Trunk Exceptions: increased body habitus.   Communication Communication Communication: No apparent difficulties Factors Affecting Communication: Hearing impaired   Cognition Arousal: Alert Behavior During Therapy: WFL for tasks assessed/performed Cognition: Cognition impaired   Orientation impairments: Situation (unsure of reasoning for admit) Awareness: Intellectual awareness intact, Online awareness impaired   Attention impairment (select first level of impairment): Focused attention   OT - Cognition Comments: Pt repeats commands to ensure her understanding.                 Following commands: Impaired Following commands impaired: Follows one step commands with increased time     Cueing  General Comments   Cueing Techniques: Verbal cues  VSS RA.   Exercises     Shoulder Instructions      Home Living Family/patient expects to be discharged to:: Private residence Living Arrangements: Alone Available Help at Discharge: Personal care attendant Type of Home: Apartment Home Access: Stairs to enter [employer] of Steps: 4 Entrance Stairs-Rails: None Home Layout: One level     Bathroom Shower/Tub: Chief Strategy Officer: Standard     Home Equipment: Pharmacist, hospital (2 wheels);Cane - single point   Additional Comments: Pt reports she has an aide 9-3p Mon thru Fri.  Lives With: Alone;Other (Comment) (has an aide)    Prior Functioning/Environment               Mobility Comments: mod I with SPC and RW. More use of RW whan amb around home ADLs Comments: aide helps  wtih bathing and dressing.    OT Problem List: Decreased cognition;Impaired balance (sitting and/or standing);Decreased strength;Obesity   OT Treatment/Interventions: Self-care/ADL training;Patient/family education;Visual/perceptual remediation/compensation;Therapeutic exercise;Balance training;Therapeutic activities;DME and/or AE instruction;Cognitive remediation/compensation      OT Goals(Current goals can be found in the care plan section)   Acute Rehab OT Goals Patient Stated Goal: none stated OT Goal Formulation: With patient Time For Goal Achievement: 07/02/24 Potential to Achieve Goals: Good ADL Goals Pt Will Perform Grooming: standing;with modified independence Pt Will Perform Lower Body Bathing: sitting/lateral leans;with min assist Pt Will Perform Lower Body Dressing: sitting/lateral leans;sit to/from stand;with min assist Pt Will Transfer to Toilet: ambulating;with modified independence Pt Will Perform Toileting - Clothing Manipulation and hygiene: sit to/from stand;with modified independence Pt Will Perform Tub/Shower Transfer: Tub transfer;with supervision;ambulating;shower seat   OT Frequency:  Min 2X/week    Co-evaluation              AM-PAC OT 6 Clicks Daily Activity     Outcome Measure Help from another person eating meals?: None Help from another person taking care of personal grooming?: A Little Help from another person toileting, which includes using toliet, bedpan, or urinal?: A Lot Help from another person bathing (including washing, rinsing, drying)?: A Lot Help from another person to put on and taking off regular upper body clothing?: A Little Help from another person to put on and taking off  regular lower body clothing?: A Lot 6 Click Score: 16   End of Session Equipment Utilized During Treatment: Gait belt;Rolling walker (2 wheels) Nurse Communication: Mobility status  Activity Tolerance: Patient tolerated treatment well Patient left: in  bed;with call bell/phone within reach;with bed alarm set  OT Visit Diagnosis: Unsteadiness on feet (R26.81);Other abnormalities of gait and mobility (R26.89);Other symptoms and signs involving cognitive function                Time: 1011-1038 OT Time Calculation (min): 27 min Charges:  OT General Charges $OT Visit: 1 Visit OT Evaluation $OT Eval Moderate Complexity: 1 Mod OT Treatments $Self Care/Home Management : 8-22 mins  06/18/2024  AB, OTR/L  Acute Rehabilitation Services  Office: [phone]   Amber Harris 06/18/2024, 3:19 PM

## 2024-06-18 NOTE — Evaluation (Signed)
 Speech Language Pathology Evaluation Patient Details Name: Amber Harris MRN: [id number] DOB: January 31, 1961 Today's Date: 06/18/2024 Time: 8572-8548 SLP Time Calculation (min) (ACUTE ONLY): 24 min  Problem List:  Patient Active Problem List   Diagnosis Date Noted   Seizure (HCC) 06/16/2024   Hypotension 06/16/2024   Hypokalemia 06/16/2024   AKI (acute kidney injury) (HCC) 06/16/2024   Adenomatous polyp of transverse colon 02/14/2024   Adenomatous polyp of ascending colon 02/14/2024   Loud snoring 08/25/2023   Chest pain 02/14/2021   Morbid obesity (HCC) 08/22/2015   Muscle weakness (generalized) 06/24/2014   Pain in joint, shoulder region 06/24/2014   Decreased range of motion of left shoulder 06/24/2014   Renal colic on right side 09/19/2013   Hyperlipidemia LDL goal <100 10/11/2012   Tobacco user    Aortic valve disease    Dyspnea on exertion    Palpitations    Depression with anxiety    Syncope    Diabetes mellitus, type II (HCC)    Hypertension    Obesity    Past Medical History:  Past Medical History:  Diagnosis Date   Aortic valve disease    Long-standing systolic murmur   Asthma    Uses p.r.n. albuterol    Bronchitis    COPD (chronic obstructive pulmonary disease) (HCC)    Degenerative joint disease    right knee   Depression    Depression with anxiety    Diabetes mellitus    Dyspnea    exertion   Dyspnea on exertion    poor exercise tolerance   Fibroids    uterine; postmenopausal bleeding   GERD (gastroesophageal reflux disease)    Headache(784.0)    Hyperlipidemia    Hypertension    Myocardial infarction (HCC)    Obesity    Palpitations    Stroke (HCC)    TIA - left side residual   Syncope    exertional   Tobacco user    30 pack years; 04/2011: 1/4 pack per day during quick attempt   Past Surgical History:  Past Surgical History:  Procedure Laterality Date   ABDOMINAL HYSTERECTOMY  12/21/2011   Procedure: HYSTERECTOMY ABDOMINAL;  Surgeon:  Amber LULLA Server, MD;  Location: [facility];  Service: Gynecology;  Laterality: N/A;   CATARACT EXTRACTION W/PHACO Left 10/03/2020   Procedure: CATARACT EXTRACTION PHACO AND INTRAOCULAR LENS PLACEMENT LEFT EYE;  Surgeon: Amber Agent, MD;  Location: [facility];  Service: Ophthalmology;  Laterality: Left;  CDE 5.04   CATARACT EXTRACTION W/PHACO Right 10/17/2020   Procedure: CATARACT EXTRACTION PHACO AND INTRAOCULAR LENS PLACEMENT RIGHT EYE;  Surgeon: Amber Agent, MD;  Location: [facility];  Service: Ophthalmology;  Laterality: Right;  CDE: 4.60   COLONOSCOPY WITH PROPOFOL  N/A 02/14/2024   Procedure: COLONOSCOPY WITH PROPOFOL ;  Surgeon: Amber Deatrice FALCON, MD;  Location: [facility];  Service: Endoscopy;  Laterality: N/A;  9:45AM;ASA 3   LEFT HEART CATH AND CORONARY ANGIOGRAPHY N/A 02/17/2021   Procedure: LEFT HEART CATH AND CORONARY ANGIOGRAPHY;  Surgeon: Amber Sharper, MD;  Location: [facility] INVASIVE CV [facility];  Service: Cardiovascular;  Laterality: N/A;   POLYPECTOMY  02/14/2024   Procedure: POLYPECTOMY;  Surgeon: Amber Deatrice FALCON, MD;  Location: [facility];  Service: Endoscopy;;   SUBMUCOSAL INJECTION  02/14/2024   Procedure: INJECTION, SUBMUCOSAL;  Surgeon: Amber Deatrice FALCON, MD;  Location: [facility];  Service: Endoscopy;;   TUBAL LIGATION     HPI:  63 year old female with chronic diastolic CHF, carotid stenosis, prior CVA with mild  residual left-sided weakness but independent unable to control her LDLs, COPD, insulin -dependent diabetes mellitus, HTN, HLD, comes to the hospital after being found to be staring off into space and unresponsive at a family gathering.  When EMS arrived, she was found to be dysarthric, left gaze deviation and left-sided weakness.  She was also found to be hypotensive.  She had some abnormal movements with this episodes, was given Ativan  and these stopped.  CT scan on admission showed a small infarct in the right cerebellar hemisphere, new from the 2022 MRI but age-indeterminate.    Assessment / Plan / Recommendation Clinical Impression  Pt demosntrates some acute cogntiive impairment, seems mostly due to lethargy on top of some baseline speech and cognitive impariment from prior CVA. Pt initially had some difficulty sustaining attention and thus, with her memory and orientation. However, the more alert she became the more appropriate and able to explain her baseline level of function, which is semi independent with the help of an aide for shopping etc. Will /fu for result of MRI though suspect pt will be able to resume prior cognitive function when less medicated.    SLP Assessment        Assistance Recommended at Discharge     Functional Status Assessment    Frequency and Duration min 1 x/week  2 weeks      SLP Evaluation Cognition  Overall Cognitive Status: History of cognitive impairments - at baseline Arousal/Alertness: Awake/alert Orientation Level: Oriented to person;Oriented to place;Disoriented to time Attention: Sustained;Selective Sustained Attention: Impaired Sustained Attention Impairment: Verbal complex Memory: Impaired Memory Impairment: Retrieval deficit;Decreased short term memory Decreased Short Term Memory: Verbal basic       Comprehension  Auditory Comprehension Overall Auditory Comprehension: Appears within functional limits for tasks assessed    Expression Verbal Expression Overall Verbal Expression: Impaired at baseline   Oral / Motor  Oral Motor/Sensory Function Overall Oral Motor/Sensory Function: Other (comment) (lip smacking) Motor Speech Overall Motor Speech: Impaired at baseline            Amber Harris, Amber Harris 06/18/2024, 3:00 PM

## 2024-06-18 NOTE — Progress Notes (Addendum)
 Hypoglycemic Event  CBG: 68   Treatment: 4 oz juice/soda  Symptoms: None  Follow-up CBG: Time: 0854 CBG Result: 104  Possible Reasons for Event: Other:    Comments/MD notified: Dr. Gherghe    Amber Harris

## 2024-06-18 NOTE — Progress Notes (Signed)
 Insulin  pump removed from her right thigh per order

## 2024-06-18 NOTE — Progress Notes (Signed)
 Subjective: Had 1 more episode of staring yesterday evening without EEG change.  Denies any concerns today.  ROS: negative except above Examination  Vital signs in last 24 hours: Temp:  [97.7 F (36.5 C)-98.1 F (36.7 C)] 98.1 F (36.7 C) (07/21 0820) Pulse Rate:  [50-54] 51 (07/21 0820) Resp:  [14-17] 16 (07/21 0820) BP: (131-149)/(52-93) 146/79 (07/21 0820) SpO2:  [99 %-100 %] 100 % (07/21 0820) Weight:  [126.4 kg] 126.4 kg (07/21 0700)  General: lying in bed, NAD Neuro: MS: Alert, oriented to place and person but not to time, follows commands CN: pupils equal and reactive,  EOMI, face symmetric, tongue midline, normal sensation over face, Motor: 5/5 strength in all 4 extremities Coordination: normal Gait: not tested  Basic Metabolic Panel: Recent Labs  Lab 06/16/24 1638 06/16/24 1647 06/17/24 0529  NA 137 140 140  K 3.0* 3.0* 3.7  CL 104 104 109  CO2 21*  --  18*  GLUCOSE 132* 126* 98  BUN 21 23 14   CREATININE 1.34* 1.40* 0.94  CALCIUM  8.5*  --  8.5*  MG  --   --  2.0    CBC: Recent Labs  Lab 06/16/24 1638 06/16/24 1647  WBC 8.2  --   NEUTROABS 3.0  --   HGB 12.4 12.6  HCT 38.0 37.0  MCV 95.5  --   PLT 175  --      Coagulation Studies: Recent Labs    06/16/24 1638  LABPROT 13.1  INR 0.9    Imaging personally reviewed  CT head without contrast 06/16/2024:  Small infarct within the right cerebellar hemisphere, new from the prior brain MRI of 02/16/2021 but otherwise age-indeterminate. No acute intracranial hemorrhage or acute demarcated cortical infarct.  CTA head and neck with and without contrast 06/06/2024: 1. The common carotid and internal carotid arteries are patent in the neck without stenosis. Mild atherosclerotic plaque bilaterally. 2. Vertebral arteries patent within the neck. Atherosclerotic plaque at both vertebral artery origins (mild stenosis on the right, suspected moderate stenosis on the left). 3. 6.0 x 3.7 cm lipoma within the  left neck (superficial to the left sternocleidomastoid muscle). 4. Cervical spondylosis. 5. Aortic Atherosclerosis (ICD10-I70.0).   CTA head:   1. Focal occlusion versus severe near-occlusive stenosis of the proximal right posterior inferior cerebellar artery (PICA), new from the prior CTA of 02/15/2021. 2. Background intracranial atherosclerotic disease as described within the body of the report. 3. At least moderate stenosis of the cavernous right internal carotid artery, unchanged. 4. New severe stenosis within a left middle cerebral artery proximal M2 branch. 5. Unchanged moderate stenosis within a left posterior cerebral artery branch at the P2/P3 junction.  MRI brain without contrast 02/16/2021: No acute intracranial abnormality. Chronic small vessel disease with minimal progression from an MRI last year.    ASSESSMENT AND PLAN:63 y.o. female with hx of stroke with residual left-sided weakness, hypertension, hyperlipidemia, diastolic CHF, TIA, bipolar disorder, schizophrenia, right carotid stenosis, COPD, diabetes and GERD who presents with an episode of decreased responsiveness with oral automatisms and intermittent L gaze deviation from a family gathering on 7/19. The movements aborted with ativan  and she subsequently had weakness on her L side that was improving. She was loaded with keppra  and put on LTM EEG.  Yesterday she had an episode of right gaze deviation but unfortunately EEG was disconnected at that time.  She did have another episode of staring yesterday evening without any EEG change.   Episodes of staring - Differentials include  seizures versus nonepileptic spells versus hypotension  Recommendation - Continue video EEG monitoring for characterization of spells - DC Keppra  to look for definite ictal-interictal abnormality - Goal blood pressure SBP more than 130 due to intracranial hypotension.  Please avoid hypotension - Will likely plan for MRI either tomorrow or on  Wednesday - Patient reports allergy/itching to aspirin .  Therefore for now we will start on Plavix  75 mg daily.  Will decide to continue or not based on MRI results -Continue seizure precautions - As needed IV Versed  for clinical seizures.  Please notify neurology if this is administered - Discussed plan with hospitalist via secure chat    I personally spent a total of 40 minutes in the care of the patient today including getting/reviewing separately obtained history, performing a medically appropriate exam/evaluation, counseling and educating, placing orders, referring and communicating with other health care professionals, documenting clinical information in the EHR, independently interpreting results, and coordinating care.         Arlin Krebs Epilepsy [facility] For questions after 5pm please refer to AMION to reach the Neurologist on call

## 2024-06-18 NOTE — Progress Notes (Signed)
 PROGRESS NOTE  Amber Harris FMW:[id number] DOB: [birth year] DOA: 06/16/2024 PCP: Orpha Yancey LABOR, MD   LOS: 2 days   Brief Narrative / Interim history: 63 year old female with chronic diastolic CHF, carotid stenosis, prior CVA with mild residual left-sided weakness but independent unable to control her LDLs, COPD, insulin -dependent diabetes mellitus, HTN, HLD, comes to the hospital after being found to be staring off into space and unresponsive at a family gathering.  When EMS arrived, she was found to be dysarthric, left gaze deviation and left-sided weakness.  She was also found to be hypotensive.  She had some abnormal movements with this episodes, was given Ativan  and these stopped.  CT scan on admission showed a small infarct in the right cerebellar hemisphere, new from the 2022 MRI but age-indeterminate.  Neurology was consulted and she was admitted to the hospital.  Subjective / 24h Interval events: She is slightly confused today.  She reports that she is hungry.  Seems to be smacking her lips, initially had difficulties looking towards the right side but eventually was able to do so.  She denies any chest pain, denies any shortness of breath  Assesement and Plan: Principal Problem:   Seizure (HCC) Active Problems:   Diabetes mellitus, type II (HCC)   Hypotension   Hypokalemia   AKI (acute kidney injury) (HCC)  Principal problem Seizure, possible acute CVA -neurology consulted and following.  CT scan showed possible CVA but age indeterminant.  Currently she is on continuous EEG monitoring as she had additional possible seizures yesterday, awaiting results, and when this is done she will undergo an MRI of the brain - She was started on Keppra  as per neurology - Complete stroke workup, CT showing severe near occlusive stenosis of the proximal right PICA, new from the CT angiogram in 2022, diffuse atherosclerotic disease, new severe stenosis within the left MCA proximal M2 branch.   Antiplatelets per neurology - LDL 74.  A1c 6.6.  PT/OT pending - Continue Keppra   Active problems Acute kidney injury-patient reports that she was feeling very hot before passing out.  Creatinine 1.3, she was given IV fluids and now creatinine has normalized  Essential hypertension-allow permissive hypertension, she was hypotensive prior to coming here  Hyperlipidemia-on statin at home  Obesity, class III-BMI 42, she would benefit from weight loss  Insulin -dependent diabetes mellitus -A1c 6.6 as above.  She has an insulin  pump and a CGM monitor.  Still with intermittent hypoglycemia, continue dextrose   CBG (last 3)  Recent Labs    06/18/24 0019 06/18/24 0408 06/18/24 0822  GLUCAP 104* 127* 68*    Scheduled Meds:  enoxaparin  (LOVENOX ) injection  40 mg Subcutaneous Q24H   insulin  aspart  0-9 Units Subcutaneous Q4H   levETIRAcetam   1,000 mg Intravenous Q12H   Continuous Infusions:  dextrose  50 mL/hr at 06/18/24 0359   PRN Meds:.acetaminophen  **OR** acetaminophen  (TYLENOL ) oral liquid 160 mg/5 mL **OR** acetaminophen , albuterol   Current Outpatient Medications  Medication Instructions   albuterol  (PROVENTIL ,VENTOLIN ) 90 MCG/ACT inhaler 2 puffs, Every 6 hours PRN   aspirin  EC 81 mg, Daily   chlorthalidone  (HYGROTON ) 50 mg, Daily   citalopram (CELEXA) 20 mg, Every morning   clopidogrel  (PLAVIX ) 75 mg, Every morning   Continuous Glucose Sensor (DEXCOM G7 SENSOR) MISC CHANGE EVERY 10 DAYS   ezetimibe  (ZETIA ) 10 mg, Oral, Daily   gabapentin  (NEURONTIN ) 300 mg, 3 times daily   hydrALAZINE  (APRESOLINE ) 50 mg, Oral, 2 times daily   Insulin  Disposable Pump (OMNIPOD DASH PODS, GEN 4,)  MISC Change every 48 hours   insulin  lispro (HUMALOG) 100 UNIT/ML injection USE IN OMNIPOD. MAX 100 UNITS PER DAY *DISCONTINUE NOVOLOG *   Jardiance  25 mg, Daily   methocarbamol  (ROBAXIN ) 500 mg, Daily at bedtime   omeprazole  (PRILOSEC) 20 mg, Oral, Daily   risperiDONE  (RISPERDAL ) 0.5 mg, Daily at  bedtime   rosuvastatin  (CRESTOR ) 40 mg, Oral, Daily at bedtime    Diet Orders (From admission, onward)     Start     Ordered   06/17/24 0801  Diet Heart Room service appropriate? Yes; Fluid consistency: Thin  Diet effective now       Question Answer Comment  Room service appropriate? Yes   Fluid consistency: Thin      06/17/24 0800            DVT prophylaxis: enoxaparin  (LOVENOX ) injection 40 mg Start: 06/16/24 2200   Lab Results  Component Value Date   PLT 175 06/16/2024      Code Status: Full Code  Family Communication: no family at bedside   Status is: Inpatient Remains inpatient appropriate because: LTM EEG, MRI pending  Level of care: Progressive  Consultants:  Neurology   Objective: Vitals:   06/17/24 2341 06/18/24 0311 06/18/24 0700 06/18/24 0820  BP: (!) 142/69 136/69  (!) 146/79  Pulse: (!) 50 (!) 52  (!) 51  Resp: 17 16  16   Temp: 98.1 F (36.7 C) 98 F (36.7 C)  98.1 F (36.7 C)  TempSrc: Oral Oral    SpO2: 99% 99%  100%  Weight:   126.4 kg   Height:   5' 8 (1.727 m)     Intake/Output Summary (Last 24 hours) at 06/18/2024 0847 Last data filed at 06/18/2024 0600 Gross per 24 hour  Intake 140.61 ml  Output 2350 ml  Net -2209.39 ml   Wt Readings from Last 3 Encounters:  06/18/24 126.4 kg  02/14/24 126.4 kg  02/09/24 126.4 kg    Examination:  Constitutional: NAD Eyes: lids and conjunctivae normal, no scleral icterus ENMT: mmm Neck: normal, supple Respiratory: clear to auscultation bilaterally, no wheezing, no crackles. Normal respiratory effort.  Cardiovascular: Regular rate and rhythm, no murmurs / rubs / gallops. No LE edema. Abdomen: soft, no distention, no tenderness. Bowel sounds positive.   Data Reviewed: I have independently reviewed following labs and imaging studies   CBC Recent Labs  Lab 06/16/24 1638 06/16/24 1647  WBC 8.2  --   HGB 12.4 12.6  HCT 38.0 37.0  PLT 175  --   MCV 95.5  --   MCH 31.2  --   MCHC  32.6  --   RDW 12.9  --   LYMPHSABS 4.3*  --   MONOABS 0.7  --   EOSABS 0.0  --   BASOSABS 0.1  --     Recent Labs  Lab 06/16/24 1638 06/16/24 1647 06/16/24 2213 06/17/24 0529  NA 137 140  --  140  K 3.0* 3.0*  --  3.7  CL 104 104  --  109  CO2 21*  --   --  18*  GLUCOSE 132* 126*  --  98  BUN 21 23  --  14  CREATININE 1.34* 1.40*  --  0.94  CALCIUM  8.5*  --   --  8.5*  AST 31  --   --   --   ALT 23  --   --   --   ALKPHOS 50  --   --   --  BILITOT 1.1  --   --   --   ALBUMIN 2.9*  --   --   --   MG  --   --   --  2.0  INR 0.9  --   --   --   TSH  --   --   --  0.785  HGBA1C  --   --  6.6*  --   BNP  --   --   --  72.0    ------------------------------------------------------------------------------------------------------------------ Recent Labs    06/17/24 0529  CHOL 148  HDL 61  LDLCALC 74  TRIG 65  CHOLHDL 2.4    Lab Results  Component Value Date   HGBA1C 6.6 (H) 06/16/2024   ------------------------------------------------------------------------------------------------------------------ Recent Labs    06/17/24 0529  TSH 0.785    Cardiac Enzymes No results for input(s): CKMB, TROPONINI, MYOGLOBIN in the last 168 hours.  Invalid input(s): CK ------------------------------------------------------------------------------------------------------------------    Component Value Date/Time   BNP 72.0 06/17/2024 0529   BNP 9.0 05/10/2011 1503    CBG: Recent Labs  Lab 06/17/24 1941 06/17/24 2344 06/18/24 0019 06/18/24 0408 06/18/24 0822  GLUCAP 77 68* 104* 127* 68*    No results found for this or any previous visit (from the past 240 hours).   Radiology Studies: ECHOCARDIOGRAM COMPLETE BUBBLE STUDY Result Date: 06/17/2024    ECHOCARDIOGRAM REPORT   Patient Name:   Amber Harris Date of Exam: 06/17/2024 Medical Rec #:  [id number]        Height:       68.0 in Accession #:    [id number]       Weight:       278.7 lb Date of Birth:   December 24, 1960        BSA:          2.353 m Patient Age:    62 years         BP:           146/79 mmHg Patient Gender: F                HR:           54 bpm. Exam Location:  Inpatient Procedure: 2D Echo, Cardiac Doppler, Color Doppler and Saline Contrast Bubble            Study (Both Spectral and Color Flow Doppler were utilized during            procedure). Indications:    Stroke  History:        Patient has prior history of Echocardiogram examinations.                 Signs/Symptoms:Chest Pain and Syncope; Risk Factors:Diabetes,                 Hypertension and Dyslipidemia.  Sonographer:    Christiana Mbomeh Referring Phys: [id number] VASUNDHRA RATHORE IMPRESSIONS  1. Left ventricular ejection fraction, by estimation, is 60 to 65%. The left ventricle has normal function. The left ventricle has no regional wall motion abnormalities. There is severe asymmetric left ventricular hypertrophy of the septal and basal-septal segments. Left ventricular diastolic parameters are indeterminate.  2. Right ventricular systolic function is normal. The right ventricular size is normal.  3. A small pericardial effusion is present. The pericardial effusion is posterior to the left ventricle. There is no evidence of cardiac tamponade.  4. The mitral valve is normal in structure. Mild mitral valve regurgitation. No evidence of mitral stenosis.  5. The aortic valve is normal in structure. Aortic valve regurgitation is mild to moderate. Aortic valve sclerosis is present, with no evidence of aortic valve stenosis.  6. The inferior vena cava is normal in size with greater than 50% respiratory variability, suggesting right atrial pressure of 3 mmHg.  7. Agitated saline contrast bubble study was negative, with no evidence of any interatrial shunt. FINDINGS  Left Ventricle: Left ventricular ejection fraction, by estimation, is 60 to 65%. The left ventricle has normal function. The left ventricle has no regional wall motion abnormalities. The left  ventricular internal cavity size was normal in size. There is  severe asymmetric left ventricular hypertrophy of the septal and basal-septal segments. Left ventricular diastolic parameters are indeterminate. Right Ventricle: The right ventricular size is normal. No increase in right ventricular wall thickness. Right ventricular systolic function is normal. Left Atrium: Left atrial size was normal in size. Right Atrium: Right atrial size was normal in size. Pericardium: A small pericardial effusion is present. The pericardial effusion is posterior to the left ventricle. There is no evidence of cardiac tamponade. Presence of epicardial fat layer. Mitral Valve: The mitral valve is normal in structure. Mild mitral valve regurgitation. No evidence of mitral valve stenosis. Tricuspid Valve: The tricuspid valve is not well visualized. Tricuspid valve regurgitation is not demonstrated. No evidence of tricuspid stenosis. Aortic Valve: The aortic valve is normal in structure. Aortic valve regurgitation is mild to moderate. Aortic regurgitation PHT measures 568 msec. Aortic valve sclerosis is present, with no evidence of aortic valve stenosis. Aortic valve mean gradient measures 6.0 mmHg. Aortic valve peak gradient measures 10.9 mmHg. Aortic valve area, by VTI measures 3.30 cm. Pulmonic Valve: The pulmonic valve was normal in structure. Pulmonic valve regurgitation is not visualized. No evidence of pulmonic stenosis. Aorta: The aortic root is normal in size and structure. Venous: The inferior vena cava is normal in size with greater than 50% respiratory variability, suggesting right atrial pressure of 3 mmHg. IAS/Shunts: No atrial level shunt detected by color flow Doppler. Agitated saline contrast was given intravenously to evaluate for intracardiac shunting. Agitated saline contrast bubble study was negative, with no evidence of any interatrial shunt.  LEFT VENTRICLE PLAX 2D LVIDd:         5.30 cm   Diastology LVIDs:          3.50 cm   LV e' medial:    11.10 cm/s LV PW:         1.10 cm   LV E/e' medial:  7.0 LV IVS:        1.60 cm   LV e' lateral:   9.14 cm/s LVOT diam:     2.20 cm   LV E/e' lateral: 8.5 LV SV:         134 LV SV Index:   57 LVOT Area:     3.80 cm  RIGHT VENTRICLE            IVC RV S prime:     9.32 cm/s  IVC diam: 2.50 cm TAPSE (M-mode): 2.0 cm LEFT ATRIUM             Index LA Vol (A2C):   94.4 ml 40.12 ml/m LA Vol (A4C):   46.6 ml 19.80 ml/m LA Biplane Vol: 67.4 ml 28.64 ml/m  AORTIC VALVE AV Area (Vmax):    3.04 cm AV Area (Vmean):   2.80 cm AV Area (VTI):     3.30 cm AV Vmax:  165.00 cm/s AV Vmean:          110.000 cm/s AV VTI:            0.407 m AV Peak Grad:      10.9 mmHg AV Mean Grad:      6.0 mmHg LVOT Vmax:         132.00 cm/s LVOT Vmean:        81.100 cm/s LVOT VTI:          0.353 m LVOT/AV VTI ratio: 0.87 AI PHT:            568 msec  AORTA Ao Root diam: 3.10 cm Ao Asc diam:  3.50 cm MITRAL VALVE               TRICUSPID VALVE MV Area (PHT): 3.45 cm    TR Peak grad:   23.6 mmHg MV Decel Time: 220 msec    TR Vmax:        243.00 cm/s MV E velocity: 77.60 cm/s MV A velocity: 86.30 cm/s  SHUNTS MV E/A ratio:  0.90        Systemic VTI:  0.35 m                            Systemic Diam: 2.20 cm Kardie Tobb DO Electronically signed by Dub Huntsman DO Signature Date/Time: 06/17/2024/5:29:04 PM    Final      Nilda Fendt, MD, PhD [facility]  Between 7 am - 7 pm I am available, please contact me via Amion (for emergencies) or Securechat (non urgent messages)  Between 7 pm - 7 am I am not available, please contact night coverage MD/APP via Amion

## 2024-06-18 NOTE — Plan of Care (Signed)

## 2024-06-19 ENCOUNTER — Other Ambulatory Visit (HOSPITAL_COMMUNITY): Payer: Self-pay

## 2024-06-19 ENCOUNTER — Inpatient Hospital Stay (HOSPITAL_COMMUNITY)

## 2024-06-19 ENCOUNTER — Encounter (HOSPITAL_COMMUNITY)

## 2024-06-19 DIAGNOSIS — I69354 Hemiplegia and hemiparesis following cerebral infarction affecting left non-dominant side: Secondary | ICD-10-CM | POA: Diagnosis not present

## 2024-06-19 DIAGNOSIS — R569 Unspecified convulsions: Secondary | ICD-10-CM | POA: Diagnosis not present

## 2024-06-19 LAB — GLUCOSE, CAPILLARY
Glucose-Capillary: 110 mg/dL — ABNORMAL HIGH (ref 70–99)
Glucose-Capillary: 120 mg/dL — ABNORMAL HIGH (ref 70–99)
Glucose-Capillary: 179 mg/dL — ABNORMAL HIGH (ref 70–99)

## 2024-06-19 LAB — BASIC METABOLIC PANEL WITH GFR
Anion gap: 6 (ref 5–15)
BUN: 12 mg/dL (ref 8–23)
CO2: 26 mmol/L (ref 22–32)
Calcium: 8.8 mg/dL — ABNORMAL LOW (ref 8.9–10.3)
Chloride: 105 mmol/L (ref 98–111)
Creatinine, Ser: 1 mg/dL (ref 0.44–1.00)
GFR, Estimated: >60 mL/min (ref >60)
Glucose, Bld: 105 mg/dL — ABNORMAL HIGH (ref 70–99)
Potassium: 3.2 mmol/L — ABNORMAL LOW (ref 3.5–5.1)
Sodium: 137 mmol/L (ref 135–145)

## 2024-06-19 LAB — CBC
HCT: 38.1 % (ref 36.0–46.0)
Hemoglobin: 12.7 g/dL (ref 12.0–15.0)
MCH: 31.1 pg (ref 26.0–34.0)
MCHC: 33.3 g/dL (ref 30.0–36.0)
MCV: 93.4 fL (ref 80.0–100.0)
Platelets: 145 K/uL — ABNORMAL LOW (ref 150–400)
RBC: 4.08 MIL/uL (ref 3.87–5.11)
RDW: 12.6 % (ref 11.5–15.5)
WBC: 4.6 K/uL (ref 4.0–10.5)
nRBC: 0 % (ref 0.0–0.2)

## 2024-06-19 LAB — MAGNESIUM: Magnesium: 1.7 mg/dL (ref 1.7–2.4)

## 2024-06-19 MED ORDER — LAMOTRIGINE 25 MG PO TABS
50.0000 mg | ORAL_TABLET | Freq: Every day | ORAL | 0 refills | Status: DC
  Filled 2024-06-19: qty 20, 10d supply, fill #0

## 2024-06-19 MED ORDER — LAMOTRIGINE 25 MG PO TABS
25.0000 mg | ORAL_TABLET | Freq: Every day | ORAL | 0 refills | Status: DC
  Filled 2024-06-19: qty 10, 10d supply, fill #0

## 2024-06-19 MED ORDER — LAMOTRIGINE 25 MG PO TABS
25.0000 mg | ORAL_TABLET | Freq: Every day | ORAL | Status: DC
  Administered 2024-06-19: 25 mg via ORAL
  Filled 2024-06-19: qty 1

## 2024-06-19 MED ORDER — POTASSIUM CHLORIDE CRYS ER 20 MEQ PO TBCR
40.0000 meq | EXTENDED_RELEASE_TABLET | Freq: Once | ORAL | Status: AC
Start: 2024-06-19 — End: 2024-06-19
  Administered 2024-06-19: 40 meq via ORAL
  Filled 2024-06-19: qty 2

## 2024-06-19 MED ORDER — LAMOTRIGINE ER 50 MG PO TB24: 50.0000 mg | ORAL_TABLET | Freq: Every day | ORAL | 0 refills | Status: AC

## 2024-06-19 MED ORDER — LAMOTRIGINE ER 50 MG PO TB24: 50.0000 mg | ORAL_TABLET | Freq: Every day | ORAL | Status: DC

## 2024-06-19 MED ORDER — LAMOTRIGINE 25 MG PO TABS: 50.0000 mg | ORAL_TABLET | Freq: Every day | ORAL | Status: DC

## 2024-06-19 MED ORDER — LAMOTRIGINE 25 MG PO TABS: 25.0000 mg | ORAL_TABLET | Freq: Every day | ORAL | 0 refills | Status: AC

## 2024-06-19 MED ORDER — LAMOTRIGINE ER 50 MG PO TB24
50.0000 mg | ORAL_TABLET | Freq: Every day | ORAL | 0 refills | Status: DC
  Filled 2024-06-19: qty 30, 30d supply, fill #0

## 2024-06-19 NOTE — Progress Notes (Signed)
Patient is off the unit for MRI 

## 2024-06-19 NOTE — Progress Notes (Deleted)
 PROGRESS NOTE  Amber Harris FMW:[id number] DOB: Apr 15, 1961 DOA: 06/16/2024 PCP: Orpha Yancey LABOR, MD   LOS: 3 days   Brief Narrative / Interim history: 63 year old female with chronic diastolic CHF, carotid stenosis, prior CVA with mild residual left-sided weakness but independent unable to control her LDLs, COPD, insulin -dependent diabetes mellitus, HTN, HLD, comes to the hospital after being found to be staring off into space and unresponsive at a family gathering.  When EMS arrived, she was found to be dysarthric, left gaze deviation and left-sided weakness.  She was also found to be hypotensive.  She had some abnormal movements with this episodes, was given Ativan  and these stopped.  CT scan on admission showed a small infarct in the right cerebellar hemisphere, new from the 2022 MRI but age-indeterminate.  Neurology was consulted and she was admitted to the hospital.  Subjective / 24h Interval events: She is a little bit more appropriate today, however appears childish at times.  She remembers that she is in the [facility] hospital, she remembers the year 2025 and that she is here because she was acting abnormal at the family reunion.  When she tells me family reunion, she started smiling and repeated family reunion at least 3-4 times  Assesement and Plan: Principal Problem:   Seizure (HCC) Active Problems:   Diabetes mellitus, type II (HCC)   Hypotension   Hypokalemia   AKI (acute kidney injury) (HCC)  Principal problem Seizure, possible acute CVA -neurology consulted and following.  CT scan showed possible CVA but age indeterminant.  Currently she is on continuous EEG monitoring as she had additional possible seizures yesterday, awaiting results, and when this is done she will undergo an MRI of the brain - She was started on Keppra  as per neurology - Complete stroke workup, CT showing severe near occlusive stenosis of the proximal right PICA, new from the CT angiogram in 2022, diffuse  atherosclerotic disease, new severe stenosis within the left MCA proximal M2 branch.  Antiplatelets per neurology - LDL 74.  A1c 6.6.  PT/OT pending - Keppra  was held yesterday to see if seizure-like activity is captured - EEG per neurology, discontinued today, hopefully we can obtain an MRI of the brain later on  Active problems Acute kidney injury-patient reports that she was feeling very hot before passing out.  Creatinine 1.3, she was given IV fluids and now creatinine has normalized  Hypokalemia-replenish potassium  Essential hypertension-allow permissive hypertension, she was hypotensive prior to coming here.  Blood pressure acceptable today  Hyperlipidemia-on statin at home  Obesity, class III-BMI 42, she would benefit from weight loss  History of bipolar disorder/schizophrenia -she was seen in the emergency room with this early July 2025, at that time was complaining of confusion and belief that someone is in her home given her medications that she was not supposed to take.  If all neurological workup is negative, including MRI, will consult psychiatry  Insulin -dependent diabetes mellitus -A1c 6.6 as above.  She has an insulin  pump and a CGM monitor.  Still with intermittent hypoglycemia, insulin  pump discontinued and she was placed on sliding scale.  Sugars better now  CBG (last 3)  Recent Labs    06/18/24 2343 06/19/24 0404 06/19/24 0817  GLUCAP 138* 110* 120*    Scheduled Meds:  clopidogrel   75 mg Oral Daily   enoxaparin  (LOVENOX ) injection  40 mg Subcutaneous Q24H   insulin  aspart  0-9 Units Subcutaneous Q4H   Continuous Infusions:   PRN Meds:.acetaminophen  **OR** acetaminophen  (TYLENOL )  oral liquid 160 mg/5 mL **OR** acetaminophen , albuterol , midazolam   Current Outpatient Medications  Medication Instructions   albuterol  (PROVENTIL ,VENTOLIN ) 90 MCG/ACT inhaler 2 puffs, Inhalation, Every 6 hours PRN   aspirin  EC 81 mg, Oral, Daily   buprenorphine (BUTRANS) 5  MCG/HR PTWK 1 patch, Transdermal, Every Sun   chlorthalidone  (HYGROTON ) 50 mg, Oral, Daily   citalopram (CELEXA) 20 mg, Oral, Daily   clopidogrel  (PLAVIX ) 75 mg, Oral, Daily   Continuous Glucose Sensor (DEXCOM G7 SENSOR) MISC CHANGE EVERY 10 DAYS   ezetimibe  (ZETIA ) 10 mg, Oral, Daily   furosemide  (LASIX ) 20 mg, Oral, Daily   gabapentin  (NEURONTIN ) 300 mg, Oral, 3 times daily   hydrALAZINE  (APRESOLINE ) 50 mg, Oral, 2 times daily   Insulin  Disposable Pump (OMNIPOD DASH PODS, GEN 4,) MISC Change every 48 hours   insulin  lispro (HUMALOG) 100 Units, Subcutaneous, Continuous, Max daily dose of 100 units per day via insulin  pump.   Jardiance  25 mg, Daily   methocarbamol  (ROBAXIN ) 500 mg, Daily at bedtime   olmesartan (BENICAR) 40 mg, Daily   omeprazole  (PRILOSEC) 20 mg, Oral, Daily   oxybutynin (DITROPAN-XL) 5 mg, Daily at bedtime   risperiDONE  (RISPERDAL ) 0.5 mg, Daily at bedtime   rosuvastatin  (CRESTOR ) 40 mg, Oral, Daily at bedtime    Diet Orders (From admission, onward)     Start     Ordered   06/17/24 0801  Diet Heart Room service appropriate? Yes; Fluid consistency: Thin  Diet effective now       Question Answer Comment  Room service appropriate? Yes   Fluid consistency: Thin      06/17/24 0800            DVT prophylaxis: enoxaparin  (LOVENOX ) injection 40 mg Start: 06/16/24 2200   Lab Results  Component Value Date   PLT 145 (L) 06/19/2024      Code Status: Full Code  Family Communication: no family at bedside   Status is: Inpatient Remains inpatient appropriate because: LTM EEG, MRI pending  Level of care: Progressive  Consultants:  Neurology   Objective: Vitals:   06/18/24 1923 06/18/24 2342 06/19/24 0403 06/19/24 0819  BP: (!) 141/61 126/64 137/85 (!) 141/62  Pulse: 60 (!) 56 (!) 57 (!) 50  Resp: 18 15 16 16   Temp: 98.4 F (36.9 C) 97.8 F (36.6 C) 98.3 F (36.8 C) 98.6 F (37 C)  TempSrc: Oral Oral Oral Oral  SpO2: 99% 100% 93% 98%  Weight:       Height:        Intake/Output Summary (Last 24 hours) at 06/19/2024 0958 Last data filed at 06/19/2024 0135 Gross per 24 hour  Intake --  Output 1550 ml  Net -1550 ml   Wt Readings from Last 3 Encounters:  06/18/24 126.4 kg  02/14/24 126.4 kg  02/09/24 126.4 kg    Examination:  Constitutional: NAD Eyes: lids and conjunctivae normal, no scleral icterus ENMT: mmm Neck: normal, supple Respiratory: clear to auscultation bilaterally, no wheezing, no crackles. Normal respiratory effort.  Cardiovascular: Regular rate and rhythm, no murmurs / rubs / gallops. No LE edema. Abdomen: soft, no distention, no tenderness. Bowel sounds positive.   Data Reviewed: I have independently reviewed following labs and imaging studies   CBC Recent Labs  Lab 06/16/24 1638 06/16/24 1647 06/19/24 0430  WBC 8.2  --  4.6  HGB 12.4 12.6 12.7  HCT 38.0 37.0 38.1  PLT 175  --  145*  MCV 95.5  --  93.4  MCH 31.2  --  31.1  MCHC 32.6  --  33.3  RDW 12.9  --  12.6  LYMPHSABS 4.3*  --   --   MONOABS 0.7  --   --   EOSABS 0.0  --   --   BASOSABS 0.1  --   --     Recent Labs  Lab 06/16/24 1638 06/16/24 1647 06/16/24 2213 06/17/24 0529 06/19/24 0430  NA 137 140  --  140 137  K 3.0* 3.0*  --  3.7 3.2*  CL 104 104  --  109 105  CO2 21*  --   --  18* 26  GLUCOSE 132* 126*  --  98 105*  BUN 21 23  --  14 12  CREATININE 1.34* 1.40*  --  0.94 1.00  CALCIUM  8.5*  --   --  8.5* 8.8*  AST 31  --   --   --   --   ALT 23  --   --   --   --   ALKPHOS 50  --   --   --   --   BILITOT 1.1  --   --   --   --   ALBUMIN 2.9*  --   --   --   --   MG  --   --   --  2.0 1.7  INR 0.9  --   --   --   --   TSH  --   --   --  0.785  --   HGBA1C  --   --  6.6*  --   --   BNP  --   --   --  72.0  --     ------------------------------------------------------------------------------------------------------------------ Recent Labs    06/17/24 0529  CHOL 148  HDL 61  LDLCALC 74  TRIG 65  CHOLHDL 2.4     Lab Results  Component Value Date   HGBA1C 6.6 (H) 06/16/2024   ------------------------------------------------------------------------------------------------------------------ Recent Labs    06/17/24 0529  TSH 0.785    Cardiac Enzymes No results for input(s): CKMB, TROPONINI, MYOGLOBIN in the last 168 hours.  Invalid input(s): CK ------------------------------------------------------------------------------------------------------------------    Component Value Date/Time   BNP 72.0 06/17/2024 0529   BNP 9.0 05/10/2011 1503    CBG: Recent Labs  Lab 06/18/24 1550 06/18/24 1924 06/18/24 2343 06/19/24 0404 06/19/24 0817  GLUCAP 125* 130* 138* 110* 120*    No results found for this or any previous visit (from the past 240 hours).   Radiology Studies: No results found.    Nilda Fendt, MD, PhD [facility]  Between 7 am - 7 pm I am available, please contact me via Amion (for emergencies) or Securechat (non urgent messages)  Between 7 pm - 7 am I am not available, please contact night coverage MD/APP via Amion

## 2024-06-19 NOTE — Plan of Care (Signed)
 Problem: Education: Goal: Knowledge of General Education information will improve Description: Including pain rating scale, medication(s)/side effects and non-pharmacologic comfort measures Outcome: Adequate for Discharge   Problem: Health Behavior/Discharge Planning: Goal: Ability to manage health-related needs will improve Outcome: Adequate for Discharge   Problem: Clinical Measurements: Goal: Ability to maintain clinical measurements within normal limits will improve Outcome: Adequate for Discharge Goal: Will remain free from infection Outcome: Adequate for Discharge Goal: Diagnostic test results will improve Outcome: Adequate for Discharge Goal: Respiratory complications will improve Outcome: Adequate for Discharge Goal: Cardiovascular complication will be avoided Outcome: Adequate for Discharge   Problem: Activity: Goal: Risk for activity intolerance will decrease Outcome: Adequate for Discharge   Problem: Nutrition: Goal: Adequate nutrition will be maintained Outcome: Adequate for Discharge   Problem: Coping: Goal: Level of anxiety will decrease Outcome: Adequate for Discharge   Problem: Elimination: Goal: Will not experience complications related to bowel motility Outcome: Adequate for Discharge Goal: Will not experience complications related to urinary retention Outcome: Adequate for Discharge   Problem: Pain Managment: Goal: General experience of comfort will improve and/or be controlled Outcome: Adequate for Discharge   Problem: Safety: Goal: Ability to remain free from injury will improve Outcome: Adequate for Discharge   Problem: Skin Integrity: Goal: Risk for impaired skin integrity will decrease Outcome: Adequate for Discharge   Problem: Education: Goal: Ability to describe self-care measures that may prevent or decrease complications (Diabetes Survival Skills Education) will improve Outcome: Adequate for Discharge Goal: Individualized Educational  Video(s) Outcome: Adequate for Discharge   Problem: Coping: Goal: Ability to adjust to condition or change in health will improve Outcome: Adequate for Discharge   Problem: Fluid Volume: Goal: Ability to maintain a balanced intake and output will improve Outcome: Adequate for Discharge   Problem: Health Behavior/Discharge Planning: Goal: Ability to identify and utilize available resources and services will improve Outcome: Adequate for Discharge Goal: Ability to manage health-related needs will improve Outcome: Adequate for Discharge   Problem: Metabolic: Goal: Ability to maintain appropriate glucose levels will improve Outcome: Adequate for Discharge   Problem: Nutritional: Goal: Maintenance of adequate nutrition will improve Outcome: Adequate for Discharge Goal: Progress toward achieving an optimal weight will improve Outcome: Adequate for Discharge   Problem: Skin Integrity: Goal: Risk for impaired skin integrity will decrease Outcome: Adequate for Discharge   Problem: Tissue Perfusion: Goal: Adequacy of tissue perfusion will improve Outcome: Adequate for Discharge   Problem: Education: Goal: Knowledge of disease or condition will improve Outcome: Adequate for Discharge Goal: Knowledge of secondary prevention will improve (MUST DOCUMENT ALL) Outcome: Adequate for Discharge Goal: Knowledge of patient specific risk factors will improve (DELETE if not current risk factor) Outcome: Adequate for Discharge   Problem: Ischemic Stroke/TIA Tissue Perfusion: Goal: Complications of ischemic stroke/TIA will be minimized Outcome: Adequate for Discharge   Problem: Coping: Goal: Will verbalize positive feelings about self Outcome: Adequate for Discharge Goal: Will identify appropriate support needs Outcome: Adequate for Discharge   Problem: Health Behavior/Discharge Planning: Goal: Ability to manage health-related needs will improve Outcome: Adequate for Discharge Goal:  Goals will be collaboratively established with patient/family Outcome: Adequate for Discharge   Problem: Self-Care: Goal: Ability to participate in self-care as condition permits will improve Outcome: Adequate for Discharge Goal: Verbalization of feelings and concerns over difficulty with self-care will improve Outcome: Adequate for Discharge Goal: Ability to communicate needs accurately will improve Outcome: Adequate for Discharge   Problem: Nutrition: Goal: Risk of aspiration will decrease Outcome: Adequate for Discharge  Goal: Dietary intake will improve Outcome: Adequate for Discharge

## 2024-06-19 NOTE — Care Management Important Message (Signed)
 Important Message  Patient Details  Name: Amber Harris MRN: [id number] Date of Birth: Jul 07, 1961   Important Message Given:  Yes - Medicare IM     Claretta Deed 06/19/2024, 3:58 PM

## 2024-06-19 NOTE — Progress Notes (Signed)
 Patient discharged, AVS instruction given to patient and her daughter, verbalized understanding, wheeled to main entrance A to daughter's car

## 2024-06-19 NOTE — Progress Notes (Signed)
 Patient is back to the unit.

## 2024-06-19 NOTE — Progress Notes (Signed)
 Subjective: No acute events overnight.  However has had multiple episodes of stuttering per staff.  ROS: negative except above  Examination  Vital signs in last 24 hours: Temp:  [97.5 F (36.4 C)-98.6 F (37 C)] 98.6 F (37 C) (07/22 0819) Pulse Rate:  [50-64] 50 (07/22 0819) Resp:  [15-18] 16 (07/22 0819) BP: (126-155)/(60-85) 141/62 (07/22 0819) SpO2:  [93 %-100 %] 98 % (07/22 0819)  General: lying in bed, NAD Neuro: MS: Alert, oriented to place and person but not to time, follows commands CN: pupils equal and reactive,  EOMI, face symmetric, tongue midline, normal sensation over face, Motor: 5/5 strength in all 4 extremities Coordination: normal Gait: not tested  Basic Metabolic Panel: Recent Labs  Lab 06/16/24 1638 06/16/24 1647 06/17/24 0529 06/19/24 0430  NA 137 140 140 137  K 3.0* 3.0* 3.7 3.2*  CL 104 104 109 105  CO2 21*  --  18* 26  GLUCOSE 132* 126* 98 105*  BUN 21 23 14 12   CREATININE 1.34* 1.40* 0.94 1.00  CALCIUM  8.5*  --  8.5* 8.8*  MG  --   --  2.0 1.7    CBC: Recent Labs  Lab 06/16/24 1638 06/16/24 1647 06/19/24 0430  WBC 8.2  --  4.6  NEUTROABS 3.0  --   --   HGB 12.4 12.6 12.7  HCT 38.0 37.0 38.1  MCV 95.5  --  93.4  PLT 175  --  145*     Coagulation Studies: Recent Labs    06/16/24 1638  LABPROT 13.1  INR 0.9    Imaging personally reviewed  MRI brain without contrast 06/19/2024: No acute abnormality.  White matter changes.  ASSESSMENT AND PLAN:63 y.o. female with hx of stroke with residual left-sided weakness, hypertension, hyperlipidemia, diastolic CHF, TIA, bipolar disorder, schizophrenia, right carotid stenosis, COPD, diabetes and GERD who presents with an episode of decreased responsiveness with oral automatisms and intermittent L gaze deviation from a family gathering on 7/19. The movements aborted with ativan  and she subsequently had weakness on her L side that was improving. She was loaded with keppra  and put on LTM EEG.   Yesterday she had an episode of right gaze deviation but unfortunately EEG was disconnected at that time.  She did have another episode of staring yesterday evening without any EEG change.   Episodes of staring EEG did not show any ictal-interictal abnormality.  Multiple events were recorded without EEG change.  These events are most likely nonepileptic   Recommendation - DC LTM EEG - Recommend MRI brain without contrast to look for any acute abnormality - If MRI brain is negative, these episodes are most likely psychosomatic.  Would recommend psychiatry consult - Patient has had multiple episodes.  Therefore in the setting of her psych history, I would lean towards starting her on lamotrigine . - Start lamotrigine  25 mg daily for 10 days followed by 50 mg daily - Discussed with hospitalist via secure chat     I personally spent a total of 40 minutes in the care of the patient today including getting/reviewing separately obtained history, performing a medically appropriate exam/evaluation, counseling and educating, placing orders, referring and communicating with other health care professionals, documenting clinical information in the EHR, independently interpreting results, and coordinating care.      Arlin Krebs Epilepsy [facility] For questions after 5pm please refer to AMION to reach the Neurologist on call

## 2024-06-19 NOTE — Progress Notes (Signed)
 LTM VIDEO EEG discontinued - no skin breakdown at [facility]

## 2024-06-19 NOTE — Discharge Summary (Addendum)
 Physician Discharge Summary  Amber Harris FMW:[id number] DOB: 1961/09/30 DOA: 06/16/2024  PCP: Orpha Yancey LABOR, MD  Admit date: 06/16/2024 Discharge date: 06/19/2024  Admitted From: home Disposition:  home  Recommendations for Outpatient Follow-up:  Follow up with PCP in 1-2 weeks Follow up with psychiatry as an outpatient  Home Health: PT Equipment/Devices: none  Discharge Condition: stable CODE STATUS: Full code Diet Orders (From admission, onward)     Start     Ordered   06/17/24 0801  Diet Heart Room service appropriate? Yes; Fluid consistency: Thin  Diet effective now       Question Answer Comment  Room service appropriate? Yes   Fluid consistency: Thin      06/17/24 0800            Brief Narrative / Interim history: 63 year old female with chronic diastolic CHF, carotid stenosis, prior CVA with mild residual left-sided weakness but independent unable to control her LDLs, COPD, insulin -dependent diabetes mellitus, HTN, HLD, comes to the hospital after being found to be staring off into space and unresponsive at a family gathering.  When EMS arrived, she was found to be dysarthric, left gaze deviation and left-sided weakness.  She was also found to be hypotensive.  She had some abnormal movements with this episodes, was given Ativan  and these stopped.  CT scan on admission showed a small infarct in the right cerebellar hemisphere, new from the 2022 MRI but age-indeterminate.  Neurology was consulted and she was admitted to the hospital.  Hospital Course / Discharge diagnoses: Principal Problem:   Seizure [facility]) Active Problems:   Diabetes mellitus, type II (HCC)   Hypotension   Hypokalemia   AKI (acute kidney injury) (HCC)    Principal problem Seizure, possible acute CVA -neurology consulted and followed patient while hospitalized.  CT scan showed a possible CVA with age indeterminant.  She was placed on continuous EEG monitoring as she has had additional  possible seizures.  She was initially placed on Keppra , however due to concern that this might mask seizure activity, Keppra  was discontinued.  Ongoing EEG was completed for 48 hours, she has had several spells but no epileptiform discharges were detected, suggesting psychogenic component to it.  After EEG was done she underwent an MRI of the brain which was negative for acute CVA.  Her symptoms resolved, she is back to baseline as confirmed by family, and she will be discharged home in stable condition.  She was placed on Lamictal  per neurology for psychogenic spells   Active problems Acute kidney injury-patient reports that she was feeling very hot before passing out.  Creatinine 1.3, she was given IV fluids and now creatinine has normalized Hypokalemia-potassium replaced Essential hypertension-resume home medications on discharge Hyperlipidemia-on statin at home Obesity, class III-BMI 42, she would benefit from weight loss History of bipolar disorder/schizophrenia -she was seen in the emergency room with this early July 2025, at that time was complaining of confusion and belief that someone is in her home given her medications that she was not supposed to take.  At that time she tells me she was smoking marijuana laced with cocaine.  She has regular follow-up with psychiatry and I advised her to set up an appointment following this hospitalization.  Insulin -dependent diabetes mellitus -A1c 6.6 as above.  Continue home medications on discharge  Sepsis ruled out   Discharge Instructions   Allergies as of 06/19/2024       Reactions   Bayer Aspirin  [aspirin ] Itching  Reaction to 325mg  or higher - OK to take low dose ASA daily.   Penicillins Rash        Medication List     TAKE these medications    albuterol  90 MCG/ACT inhaler Commonly known as: PROVENTIL ,VENTOLIN  Inhale 2 puffs into the lungs every 6 (six) hours as needed for wheezing or shortness of breath.   aspirin  EC 81 MG  tablet Take 81 mg by mouth daily.   buprenorphine 5 MCG/HR Ptwk Commonly known as: BUTRANS Place 1 patch onto the skin every Sunday.   chlorthalidone  50 MG tablet Commonly known as: HYGROTON  Take 50 mg by mouth daily.   citalopram 20 MG tablet Commonly known as: CELEXA Take 20 mg by mouth daily.   clopidogrel  75 MG tablet Commonly known as: PLAVIX  Take 75 mg by mouth daily.   Dexcom G7 Sensor Misc CHANGE EVERY 10 DAYS   ezetimibe  10 MG tablet Commonly known as: ZETIA  TAKE 1 TABLET BY MOUTH ONCE DAILY   furosemide  20 MG tablet Commonly known as: LASIX  Take 20 mg by mouth daily.   gabapentin  300 MG capsule Commonly known as: NEURONTIN  Take 300 mg by mouth 3 (three) times daily.   hydrALAZINE  50 MG tablet Commonly known as: APRESOLINE  TAKE 1 TABLET BY MOUTH TWICE DAILY   insulin  lispro 100 UNIT/ML injection Commonly known as: HUMALOG Inject 100 Units into the skin continuous. Max daily dose of 100 units per day via insulin  pump.   Jardiance  25 MG Tabs tablet Generic drug: empagliflozin  Take 25 mg by mouth daily.   lamoTRIgine  25 MG tablet Commonly known as: LaMICtal  Take 1 tablet (25 mg total) by mouth daily. 25 mg daily x 10 days, then 50 mg daily for 10 days   lamoTRIgine  50 MG 24 hour tablet Commonly known as: LaMICtal  XR Take 1 tablet (50 mg total) by mouth daily. Start taking on: July 09, 2025   methocarbamol  500 MG tablet Commonly known as: ROBAXIN  Take 500 mg by mouth at bedtime.   olmesartan 40 MG tablet Commonly known as: BENICAR Take 40 mg by mouth daily.   omeprazole  20 MG capsule Commonly known as: PRILOSEC Take 1 capsule (20 mg total) by mouth daily.   Omnipod DASH Pods (Gen 4) Misc Change every 48 hours   oxybutynin 5 MG 24 hr tablet Commonly known as: DITROPAN-XL Take 5 mg by mouth at bedtime.   risperiDONE  0.5 MG tablet Commonly known as: RISPERDAL  Take 0.5 mg by mouth at bedtime.   rosuvastatin  40 MG tablet Commonly known  as: CRESTOR  TAKE 1 TABLET BY MOUTH EVERY DAY AT BEDTIME        Follow-up Information     [facility] Follow up.   Why: Representative from [place] will contact you to schedule start of home health services. Contact information: [address] [place] [address] [phone]                Consultations: Neurology  Procedures/Studies:  MR BRAIN WO CONTRAST Result Date: 06/19/2024 CLINICAL DATA:  Stroke, follow up EXAM: MRI HEAD WITHOUT CONTRAST TECHNIQUE: Multiplanar, multiecho pulse sequences of the brain and surrounding structures were obtained without intravenous contrast. COMPARISON:  CT angiogram of the head dated June 16, 2024. FINDINGS: Brain: There is no restricted diffusion to indicate acute or recent infarction. There is age related cerebral volume loss and mild to moderate periventricular and subcortical white matter disease. There is no evidence of hemorrhage, mass, cortical infarction or hydrocephalus.  Vascular: Normal vascular flow voids. Skull and upper cervical spine: Normal marrow signal. No osseous lesions. Sinuses/Orbits: Clear paranasal sinuses. Status post bilateral lens replacement. Other: None. IMPRESSION: 1. Mild to moderate cerebral white matter disease. Electronically Signed   By: Evalene Coho M.D.   On: 06/19/2024 12:02   ECHOCARDIOGRAM COMPLETE BUBBLE STUDY Result Date: 06/17/2024    ECHOCARDIOGRAM REPORT   Patient Name:   JERICA CREEGAN Date of Exam: 06/17/2024 Medical Rec #:  [id number]        Height:       68.0 in Accession #:    [id number]       Weight:       278.7 lb Date of Birth:  Dec 16, 1960        BSA:          2.353 m Patient Age:    62 years         BP:           146/79 mmHg Patient Gender: F                HR:           54 bpm. Exam Location:  Inpatient Procedure: 2D Echo, Cardiac Doppler, Color Doppler and Saline Contrast Bubble            Study (Both Spectral and Color Flow Doppler were utilized during             procedure). Indications:    Stroke  History:        Patient has prior history of Echocardiogram examinations.                 Signs/Symptoms:Chest Pain and Syncope; Risk Factors:Diabetes,                 Hypertension and Dyslipidemia.  Sonographer:    Christiana Mbomeh Referring Phys: [id number] VASUNDHRA RATHORE IMPRESSIONS  1. Left ventricular ejection fraction, by estimation, is 60 to 65%. The left ventricle has normal function. The left ventricle has no regional wall motion abnormalities. There is severe asymmetric left ventricular hypertrophy of the septal and basal-septal segments. Left ventricular diastolic parameters are indeterminate.  2. Right ventricular systolic function is normal. The right ventricular size is normal.  3. A small pericardial effusion is present. The pericardial effusion is posterior to the left ventricle. There is no evidence of cardiac tamponade.  4. The mitral valve is normal in structure. Mild mitral valve regurgitation. No evidence of mitral stenosis.  5. The aortic valve is normal in structure. Aortic valve regurgitation is mild to moderate. Aortic valve sclerosis is present, with no evidence of aortic valve stenosis.  6. The inferior vena cava is normal in size with greater than 50% respiratory variability, suggesting right atrial pressure of 3 mmHg.  7. Agitated saline contrast bubble study was negative, with no evidence of any interatrial shunt. FINDINGS  Left Ventricle: Left ventricular ejection fraction, by estimation, is 60 to 65%. The left ventricle has normal function. The left ventricle has no regional wall motion abnormalities. The left ventricular internal cavity size was normal in size. There is  severe asymmetric left ventricular hypertrophy of the septal and basal-septal segments. Left ventricular diastolic parameters are indeterminate. Right Ventricle: The right ventricular size is normal. No increase in right ventricular wall thickness. Right ventricular systolic  function is normal. Left Atrium: Left atrial size was normal in size. Right Atrium: Right atrial size was normal in size. Pericardium: A small pericardial effusion is  present. The pericardial effusion is posterior to the left ventricle. There is no evidence of cardiac tamponade. Presence of epicardial fat layer. Mitral Valve: The mitral valve is normal in structure. Mild mitral valve regurgitation. No evidence of mitral valve stenosis. Tricuspid Valve: The tricuspid valve is not well visualized. Tricuspid valve regurgitation is not demonstrated. No evidence of tricuspid stenosis. Aortic Valve: The aortic valve is normal in structure. Aortic valve regurgitation is mild to moderate. Aortic regurgitation PHT measures 568 msec. Aortic valve sclerosis is present, with no evidence of aortic valve stenosis. Aortic valve mean gradient measures 6.0 mmHg. Aortic valve peak gradient measures 10.9 mmHg. Aortic valve area, by VTI measures 3.30 cm. Pulmonic Valve: The pulmonic valve was normal in structure. Pulmonic valve regurgitation is not visualized. No evidence of pulmonic stenosis. Aorta: The aortic root is normal in size and structure. Venous: The inferior vena cava is normal in size with greater than 50% respiratory variability, suggesting right atrial pressure of 3 mmHg. IAS/Shunts: No atrial level shunt detected by color flow Doppler. Agitated saline contrast was given intravenously to evaluate for intracardiac shunting. Agitated saline contrast bubble study was negative, with no evidence of any interatrial shunt.  LEFT VENTRICLE PLAX 2D LVIDd:         5.30 cm   Diastology LVIDs:         3.50 cm   LV e' medial:    11.10 cm/s LV PW:         1.10 cm   LV E/e' medial:  7.0 LV IVS:        1.60 cm   LV e' lateral:   9.14 cm/s LVOT diam:     2.20 cm   LV E/e' lateral: 8.5 LV SV:         134 LV SV Index:   57 LVOT Area:     3.80 cm  RIGHT VENTRICLE            IVC RV S prime:     9.32 cm/s  IVC diam: 2.50 cm TAPSE (M-mode):  2.0 cm LEFT ATRIUM             Index LA Vol (A2C):   94.4 ml 40.12 ml/m LA Vol (A4C):   46.6 ml 19.80 ml/m LA Biplane Vol: 67.4 ml 28.64 ml/m  AORTIC VALVE AV Area (Vmax):    3.04 cm AV Area (Vmean):   2.80 cm AV Area (VTI):     3.30 cm AV Vmax:           165.00 cm/s AV Vmean:          110.000 cm/s AV VTI:            0.407 m AV Peak Grad:      10.9 mmHg AV Mean Grad:      6.0 mmHg LVOT Vmax:         132.00 cm/s LVOT Vmean:        81.100 cm/s LVOT VTI:          0.353 m LVOT/AV VTI ratio: 0.87 AI PHT:            568 msec  AORTA Ao Root diam: 3.10 cm Ao Asc diam:  3.50 cm MITRAL VALVE               TRICUSPID VALVE MV Area (PHT): 3.45 cm    TR Peak grad:   23.6 mmHg MV Decel Time: 220 msec    TR Vmax:  243.00 cm/s MV E velocity: 77.60 cm/s MV A velocity: 86.30 cm/s  SHUNTS MV E/A ratio:  0.90        Systemic VTI:  0.35 m                            Systemic Diam: 2.20 cm Dub Tobb DO Electronically signed by Dub Huntsman DO Signature Date/Time: 06/17/2024/5:29:04 PM    Final    Overnight EEG with video Result Date: 06/17/2024 Shelton Arlin KIDD, MD     06/19/2024  8:51 AM Patient Name: Amber Harris MRN: [id number] Epilepsy Attending: Arlin KIDD Shelton Referring Physician/Provider: everitt Clint Abbey Earle FORBES, NP Duration: 06/17/2024 0451 to  06/18/2024 0545 Patient history:  63 y.o. female with hx of stroke with residual left-sided weakness, right carotid stenosis who presents with an episode of decreased responsiveness with abnormal mouth movements while at a family gathering. EEG to evaluate for seizure Level of alertness: Awake, asleep AEDs during EEG study: LEV Technical aspects: This EEG study was done with scalp electrodes positioned according to the 10-20 International system of electrode placement. Electrical activity was reviewed with band pass filter of 1-70Hz , sensitivity of 7 uV/mm, display speed of 66mm/sec with a 60Hz  notched filter applied as appropriate. EEG data were recorded continuously and  digitally stored.  Video monitoring was available and reviewed as appropriate. Description: The posterior dominant rhythm consists of 9-10 Hz activity of moderate voltage (25-35 uV) seen predominantly in posterior head regions, symmetric and reactive to eye opening and eye closing. Sleep was characterized by vertex waves, sleep spindles (12 to 14 Hz), maximal frontocentral region. Hyperventilation and photic stimulation were not performed.  Event button was pressed on 06/17/2024 at 1146.  Reportedly patient was staring into space, had right gaze deviation and automatisms. Concomitant EEG did not show any EEG change.  However due to some technical issues, video was not available and EEG got disconnected while the episode was ongoing and therefore unable to review EEG. Event button was again pressed on 06/17/2024 at 1636.  Reportedly patient was staring off into space.  Concomitant EEG before, during and after the event did not show any EEG change to suggest seizure. EEG was disconnected between 06/17/2024 1146 to 1237 for testing. IMPRESSION: This study is within normal limits. No seizures or epileptiform discharges were seen throughout the recording. One event was recorded on 06/17/2024 as described above.  At the beginning of the event, no EEG change was noted.  Unfortunately due to technical issues EEG was disconnected while episode was ongoing and therefore unable to review rest of the EEG. One event was recorded on 06/17/2024 at 1636 as described above without concomitant EEG change.  This was most likely not an epileptic event. A normal interictal EEG does not exclude the diagnosis of epilepsy. Priyanka O Yadav   CT ANGIO HEAD NECK W WO CM W PERF (CODE STROKE) Addendum Date: 06/16/2024 ADDENDUM REPORT: 06/16/2024 18:38 ADDENDUM: CTA head impressions #1 and #4 called by telephone on 06/16/2024 at 6:08 pm to provider [facility] , who verbally acknowledged these results. Negative perfusion results were also  discussed at this time. Electronically Signed   By: Rockey Childs D.O.   On: 06/16/2024 18:38   Result Date: 06/16/2024 CLINICAL DATA:  Provided history: Neuro deficit, acute, stroke suspected. EXAM: CT ANGIOGRAPHY HEAD AND NECK CT PERFUSION BRAIN TECHNIQUE: Multidetector CT imaging of the head and neck was performed using the standard protocol  during bolus administration of intravenous contrast. Multiplanar CT image reconstructions and MIPs were obtained to evaluate the vascular anatomy. Carotid stenosis measurements (when applicable) are obtained utilizing NASCET criteria, using the distal internal carotid diameter as the denominator. Multiphase CT imaging of the brain was performed following IV bolus contrast injection. Subsequent parametric perfusion maps were calculated using RAPID software. RADIATION DOSE REDUCTION: This exam was performed according to the departmental dose-optimization program which includes automated exposure control, adjustment of the mA and/or kV according to patient size and/or use of iterative reconstruction technique. CONTRAST:  75mL OMNIPAQUE  IOHEXOL  350 MG/ML SOLN COMPARISON:  Non-contrast head CT performed earlier today 06/16/2024. CT angiogram head/neck and CT perfusion head 02/15/2021. FINDINGS: CTA NECK FINDINGS Aortic arch: Standard aortic branching. Atherosclerotic plaque within the visualized aortic arch and proximal major branch vessels of the neck. No hemodynamically significant innominate or proximal subclavian artery stenosis. Right carotid system: CCA and ICA patent within the neck without stenosis. Mild atherosclerotic plaque scattered within the CCA and about the carotid bifurcation. Left carotid system: CCA and ICA patent within the neck without stenosis. Mild sclerotic plaque about the carotid bifurcation. Vertebral arteries: Codominant and patent within the neck. Atherosclerotic plaque at the right vertebral artery origin resulting in mild stenosis. Atherosclerotic  plaque at the left vertebral artery origin resulting in a suspected moderate stenosis Skeleton: Cervical spondylosis. Multilevel cervical ventrolateral osteophytes, some bridging. No acute fracture or aggressive osseous lesion. Other neck: 6.0 x 3.7 cm lipoma within the left neck (superficial to the left sternocleidomastoid muscle). Upper chest: No consolidation within the imaged lung apices. Review of the MIP images confirms the above findings CTA HEAD FINDINGS Anterior circulation: The intracranial internal carotid arteries are patent. Atherosclerotic plaque within both vessels. Calcified plaque results in at least moderate stenosis of the cavernous segment on the right. No more than mild stenosis elsewhere within the intracranial ICAs. The M1 middle cerebral arteries are patent. Atherosclerotic irregularity of the M2 and more distal MCA vessels bilaterally. Most notably, there is a new severe stenosis within a superior division proximal left MCA M2 branch (series 8, image 26). The anterior cerebral arteries are patent. No intracranial aneurysm is identified. Posterior circulation: The intracranial vertebral arteries are patent. New focal occlusion versus severe near-occlusive stenosis of the proximal right PICA. The basilar artery is patent. The posterior cerebral arteries are patent. Atherosclerotic irregularity of both vessels. Most notably, there is an unchanged moderate stenosis within a left PCA branch at the P2/P3 junction. A small left posterior communicating artery is present. The right posterior communicating artery is diminutive or absent Venous sinuses: Within the limitations of contrast timing, no convincing thrombus. Anatomic variants: As described. Review of the MIP images confirms the above findings CT Brain Perfusion Findings: CBF (<30%) Volume: 0mL Perfusion (Tmax>6.0s) volume: 0mL Mismatch Volume: 0mL Infarction Location:None identified. Attempts are being made to reach the ordering provider at  this time. IMPRESSION: CTA neck: 1. The common carotid and internal carotid arteries are patent in the neck without stenosis. Mild atherosclerotic plaque bilaterally. 2. Vertebral arteries patent within the neck. Atherosclerotic plaque at both vertebral artery origins (mild stenosis on the right, suspected moderate stenosis on the left). 3. 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 4. Cervical spondylosis. 5. Aortic Atherosclerosis (ICD10-I70.0). CTA head: 1. Focal occlusion versus severe near-occlusive stenosis of the proximal right posterior inferior cerebellar artery (PICA), new from the prior CTA of 02/15/2021. 2. Background intracranial atherosclerotic disease as described  within the body of the report. 3. At least moderate stenosis of the cavernous right internal carotid artery, unchanged. 4. New severe stenosis within a left middle cerebral artery proximal M2 branch. 5. Unchanged moderate stenosis within a left posterior cerebral artery branch at the P2/P3 junction. Electronically Signed: By: Rockey Childs D.O. On: 06/16/2024 17:54   CT Angio Chest/Abd/Pel for Dissection W and/or Wo Contrast Result Date: 06/16/2024 CLINICAL DATA:  Provided history: hypotension, neuro deficits EXAM: CT ANGIOGRAPHY CHEST, ABDOMEN AND PELVIS TECHNIQUE: Non-contrast CT of the chest was initially obtained. Multidetector CT imaging through the chest, abdomen and pelvis was performed using the standard protocol during bolus administration of intravenous contrast. Multiplanar reconstructed images and MIPs were obtained and reviewed to evaluate the vascular anatomy. RADIATION DOSE REDUCTION: This exam was performed according to the departmental dose-optimization program which includes automated exposure control, adjustment of the mA and/or kV according to patient size and/or use of iterative reconstruction technique. CONTRAST:  80mL OMNIPAQUE  IOHEXOL  350 MG/ML SOLN COMPARISON:   Report from chest CT 12/27/2021, abdominal CT 09/03/2021. Images not available. FINDINGS: CTA CHEST FINDINGS Cardiovascular: No aortic hematoma on unenhanced exam. The left superior intercostal vein courses over the aortic arch. Mild aortic atherosclerosis. No aneurysm, dissection or acute aortic finding. Prominent main pulmonary artery at 3.2 cm. There is no central pulmonary embolus to the segmental level. The heart is enlarged. Small pericardial effusion. Mediastinum/Nodes: No mediastinal or hilar adenopathy. No enlarged axillary lymph nodes. Patulous esophagus. Lungs/Pleura: Dependent hypoventilatory changes. Breathing motion artifact limits detailed assessment. No pleural fluid. No features of pulmonary edema. Trachea and central airways are clear. Musculoskeletal: Diffuse thoracic spondylosis with anterior spurring. There are no acute or suspicious osseous abnormalities. Review of the MIP images confirms the above findings. CTA ABDOMEN AND PELVIS FINDINGS VASCULAR Aorta: Normal caliber aorta without aneurysm, dissection, vasculitis or significant stenosis. Moderate calcified atheromatous plaque. Celiac: Patent without evidence of aneurysm, dissection, vasculitis or significant stenosis. SMA: Patent without evidence of aneurysm, dissection, vasculitis or significant stenosis. Replaced right hepatic artery arises from the SMA. Mixed calcified and noncalcified atheromatous plaque. Renals: Plaque at the origin of both renal arteries. Distal renal arteries are patent. No severe stenosis, evidence of vasculitis or FMD. IMA: Small in caliber but patent. Inflow: Patent without evidence of aneurysm, dissection, vasculitis or significant stenosis. Moderate atheromatous plaque. Veins: No obvious venous abnormality within the limitations of this arterial phase study. Review of the MIP images confirms the above findings. NON-VASCULAR Hepatobiliary: The liver is enlarged spanning 19.2 cm cranial caudal. No evidence of focal  liver lesion on this arterial phase exam. Gallbladder physiologically distended, no calcified stone. No biliary dilatation. Pancreas: No ductal dilatation or inflammation. Spleen: Normal in size and arterial enhancement. Adrenals/Urinary Tract: 12 mm left adrenal nodule was described on prior exam, needing no further imaging follow-up. Excretion of IV contrast in both renal collecting systems from neck CTA earlier today. No hydronephrosis or evidence of renal inflammation. Mild bladder wall thickening. Stomach/Bowel: No bowel obstruction or inflammation. Normal appendix. Moderate to large volume of stool in the colon. Lymphatic: Enlarged lymph nodes in the abdomen or pelvis. Reproductive: Status post hysterectomy. No adnexal masses. Other: No free air or ascites. Postsurgical change of the lower anterior abdominal wall. Musculoskeletal: Lower lumbar facet hypertrophy. Bilateral hip arthropathy. There are no acute or suspicious osseous abnormalities. Review of the MIP images confirms the above findings. IMPRESSION: 1. No aortic dissection or acute aortic abnormality. 2. Cardiomegaly with small pericardial effusion. 3. Prominent main  pulmonary artery, can be seen with pulmonary arterial hypertension. 4. Mild bladder wall thickening, recommend correlation with urinalysis. 5. Moderate to large volume of stool in the colon, can be seen with constipation. Aortic Atherosclerosis (ICD10-I70.0). Electronically Signed   By: Andrea Gasman M.D.   On: 06/16/2024 18:17   CT HEAD CODE STROKE WO CONTRAST Result Date: 06/16/2024 CLINICAL DATA:  Code stroke. Neuro deficit, acute, stroke suspected. EXAM: CT HEAD WITHOUT CONTRAST TECHNIQUE: Contiguous axial images were obtained from the base of the skull through the vertex without intravenous contrast. RADIATION DOSE REDUCTION: This exam was performed according to the departmental dose-optimization program which includes automated exposure control, adjustment of the mA and/or kV  according to patient size and/or use of iterative reconstruction technique. COMPARISON:  Brain MRI 02/16/2021. FINDINGS: Brain: Mild generalized cerebral atrophy. The cerebral white matter disease described on the prior brain MRI of 02/16/2021 is occult by CT. Small infarct within the right cerebellar hemisphere, new from the prior brain MRI of 02/16/2021 but otherwise age-indeterminate. Partially empty sella turcica. There is no acute intracranial hemorrhage. No demarcated cortical infarct. No extra-axial fluid collection. No evidence of an intracranial mass. No midline shift. Vascular: No hyperdense vessel. Atherosclerotic calcifications. Skull: No calvarial fracture or aggressive osseous lesion. Sinuses/Orbits: No mass or acute finding within the imaged orbits. No significant paranasal sinus disease at the imaged levels. ASPECTS [facility] Stroke Program Early CT Score) - Ganglionic level infarction (caudate, lentiform nuclei, internal capsule, insula, M1-M3 cortex): 7 - Supraganglionic infarction (M4-M6 cortex): 3 Total score (0-10 with 10 being normal): 10 Impression #1 communicated to Dr. Matthews at 5:08 pmon 7/19/2025by text page via the [facility] messaging system. IMPRESSION: 1. Small infarct within the right cerebellar hemisphere, new from the prior brain MRI of 02/16/2021 but otherwise age-indeterminate. Consider a brain MRI for further evaluation. 2. No acute intracranial hemorrhage or acute demarcated cortical infarct. Electronically Signed   By: Rockey Childs D.O.   On: 06/16/2024 17:10    Subjective: - no chest pain, shortness of breath, no abdominal pain, nausea or vomiting.   Discharge Exam: BP (!) 147/61 (BP Location: Left Arm)   Pulse (!) 49   Temp 98.4 F (36.9 C) (Oral)   Resp 16   Ht 5' 8 (1.727 m)   Wt 126.4 kg   LMP 10/25/2011   SpO2 98%   BMI 42.37 kg/m   General: Pt is alert, awake, not in acute distress Cardiovascular: RRR, S1/S2 +, no rubs, no gallops Respiratory: CTA  bilaterally, no wheezing, no rhonchi Abdominal: Soft, NT, ND, bowel sounds + Extremities: no edema, no cyanosis    The results of significant diagnostics from this hospitalization (including imaging, microbiology, ancillary and laboratory) are listed below for reference.     Microbiology: No results found for this or any previous visit (from the past 240 hours).   Labs: Basic Metabolic Panel: Recent Labs  Lab 06/16/24 1638 06/16/24 1647 06/17/24 0529 06/19/24 0430  NA 137 140 140 137  K 3.0* 3.0* 3.7 3.2*  CL 104 104 109 105  CO2 21*  --  18* 26  GLUCOSE 132* 126* 98 105*  BUN 21 23 14 12   CREATININE 1.34* 1.40* 0.94 1.00  CALCIUM  8.5*  --  8.5* 8.8*  MG  --   --  2.0 1.7   Liver Function Tests: Recent Labs  Lab 06/16/24 1638  AST 31  ALT 23  ALKPHOS 50  BILITOT 1.1  PROT 6.5  ALBUMIN 2.9*   CBC: Recent  Labs  Lab 06/16/24 1638 06/16/24 1647 06/19/24 0430  WBC 8.2  --  4.6  NEUTROABS 3.0  --   --   HGB 12.4 12.6 12.7  HCT 38.0 37.0 38.1  MCV 95.5  --  93.4  PLT 175  --  145*   CBG: Recent Labs  Lab 06/18/24 1924 06/18/24 2343 06/19/24 0404 06/19/24 0817 06/19/24 1217  GLUCAP 130* 138* 110* 120* 179*   Hgb A1c Recent Labs    06/16/24 2213  HGBA1C 6.6*   Lipid Profile Recent Labs    06/17/24 0529  CHOL 148  HDL 61  LDLCALC 74  TRIG 65  CHOLHDL 2.4   Thyroid  function studies Recent Labs    06/17/24 0529  TSH 0.785   Urinalysis    Component Value Date/Time   COLORURINE STRAW (A) 06/16/2024 2217   APPEARANCEUR CLEAR 06/16/2024 2217   LABSPEC 1.033 (H) 06/16/2024 2217   PHURINE 5.0 06/16/2024 2217   GLUCOSEU >=500 (A) 06/16/2024 2217   HGBUR NEGATIVE 06/16/2024 2217   BILIRUBINUR NEGATIVE 06/16/2024 2217   KETONESUR NEGATIVE 06/16/2024 2217   PROTEINUR NEGATIVE 06/16/2024 2217   UROBILINOGEN 0.2 08/24/2012 1925   NITRITE NEGATIVE 06/16/2024 2217   LEUKOCYTESUR NEGATIVE 06/16/2024 2217    FURTHER DISCHARGE INSTRUCTIONS:    Get Medicines reviewed and adjusted: Please take all your medications with you for your next visit with your Primary MD   Laboratory/radiological data: Please request your Primary MD to go over all hospital tests and procedure/radiological results at the follow up, please ask your Primary MD to get all Hospital records sent to his/her office.   In some cases, they will be blood work, cultures and biopsy results pending at the time of your discharge. Please request that your primary care M.D. goes through all the records of your hospital data and follows up on these results.   Also Note the following: If you experience worsening of your admission symptoms, develop shortness of breath, life threatening emergency, suicidal or homicidal thoughts you must seek medical attention immediately by calling 911 or calling your MD immediately  if symptoms less severe.   You must read complete instructions/literature along with all the possible adverse reactions/side effects for all the Medicines you take and that have been prescribed to you. Take any new Medicines after you have completely understood and accpet all the possible adverse reactions/side effects.    Do not drive when taking Pain medications or sleeping medications (Benzodaizepines)   Do not take more than prescribed Pain, Sleep and Anxiety Medications. It is not advisable to combine anxiety,sleep and pain medications without talking with your primary care practitioner   Special Instructions: If you have smoked or chewed Tobacco  in the last 2 yrs please stop smoking, stop any regular Alcohol  and or any Recreational drug use.   Wear Seat belts while driving.   Please note: You were cared for by a hospitalist during your hospital stay. Once you are discharged, your primary care physician will handle any further medical issues. Please note that NO REFILLS for any discharge medications will be authorized once you are discharged, as it is  imperative that you return to your primary care physician (or establish a relationship with a primary care physician if you do not have one) for your post hospital discharge needs so that they can reassess your need for medications and monitor your lab values.  Time coordinating discharge: 40 minutes  SIGNED:  Nilda Fendt, MD, PhD 06/19/2024, 3:48 PM

## 2024-06-19 NOTE — Discharge Instructions (Addendum)
 Follow with Amber Harris LABOR, MD in 5-7 days  Take Lamictal  25 mg daily x 10 days, then 50 mg daily for 10 days, then switch to the extended release Lamictal  XR 50 mg daily afterwards  Please get a complete blood count and chemistry panel checked by your Primary MD at your next visit, and again as instructed by your Primary MD. Please get your medications reviewed and adjusted by your Primary MD.  Please request your Primary MD to go over all Hospital Tests and Procedure/Radiological results at the follow up, please get all Hospital records sent to your Prim MD by signing hospital release before you go home.  In some cases, there will be blood work, cultures and biopsy results pending at the time of your discharge. Please request that your primary care M.D. goes through all the records of your hospital data and follows up on these results.  If you had Pneumonia of Lung problems at the Hospital: Please get a 2 view Chest X ray done in 6-8 weeks after hospital discharge or sooner if instructed by your Primary MD.  If you have Congestive Heart Failure: Please call your Cardiologist or Primary MD anytime you have any of the following symptoms:  1) 3 pound weight gain in 24 hours or 5 pounds in 1 week  2) shortness of breath, with or without a dry hacking cough  3) swelling in the hands, feet or stomach  4) if you have to sleep on extra pillows at night in order to breathe  Follow cardiac low salt diet and 1.5 lit/day fluid restriction.  If you have diabetes Accuchecks 4 times/day, Once in AM empty stomach and then before each meal. Log in all results and show them to your primary doctor at your next visit. If any glucose reading is under 80 or above 300 call your primary MD immediately.  If you have Seizure/Convulsions/Epilepsy: Please do not drive, operate heavy machinery, participate in activities at heights or participate in high speed sports until you have seen by Primary MD or a  Neurologist and advised to do so again. Per [place]  DMV statutes, patients with seizures are not allowed to drive until they have been seizure-free for six months.  Use caution when using heavy equipment or power tools. Avoid working on ladders or at heights. Take showers instead of baths. Ensure the water  temperature is not too high on the home water  heater. Do not go swimming alone. Do not lock yourself in a room alone (i.e. bathroom). When caring for infants or small children, sit down when holding, feeding, or changing them to minimize risk of injury to the child in the event you have a seizure. Maintain good sleep hygiene. Avoid alcohol.   If you had Gastrointestinal Bleeding: Please ask your Primary MD to check a complete blood count within one week of discharge or at your next visit. Your endoscopic/colonoscopic biopsies that are pending at the time of discharge, will also need to followed by your Primary MD.  Get Medicines reviewed and adjusted. Please take all your medications with you for your next visit with your Primary MD  Please request your Primary MD to go over all hospital tests and procedure/radiological results at the follow up, please ask your Primary MD to get all Hospital records sent to his/her office.  If you experience worsening of your admission symptoms, develop shortness of breath, life threatening emergency, suicidal or homicidal thoughts you must seek medical attention immediately by calling 911 or  calling your MD immediately  if symptoms less severe.  You must read complete instructions/literature along with all the possible adverse reactions/side effects for all the Medicines you take and that have been prescribed to you. Take any new Medicines after you have completely understood and accpet all the possible adverse reactions/side effects.   Do not drive or operate heavy machinery when taking Pain medications.   Do not take more than prescribed Pain, Sleep and  Anxiety Medications  Special Instructions: If you have smoked or chewed Tobacco  in the last 2 yrs please stop smoking, stop any regular Alcohol  and or any Recreational drug use.  Wear Seat belts while driving.  Please note You were cared for by a hospitalist during your hospital stay. If you have any questions about your discharge medications or the care you received while you were in the hospital after you are discharged, you can call the unit and asked to speak with the hospitalist on call if the hospitalist that took care of you is not available. Once you are discharged, your primary care physician will handle any further medical issues. Please note that NO REFILLS for any discharge medications will be authorized once you are discharged, as it is imperative that you return to your primary care physician (or establish a relationship with a primary care physician if you do not have one) for your aftercare needs so that they can reassess your need for medications and monitor your lab values.  You can reach the hospitalist office at phone [phone] or fax [phone]   If you do not have a primary care physician, you can call [phone] for a physician referral.  Activity: As tolerated with Full fall precautions use walker/cane & assistance as needed    Diet: regular  Disposition Home

## 2024-06-19 NOTE — Progress Notes (Signed)
 Physical Therapy Treatment Patient Details Name: Amber Harris MRN: [id number] DOB: 01/09/1961 Today's Date: 06/19/2024   History of Present Illness Pt is a 63 yo female presenting to [facility] ED on 06/16/24 after being found unresponsive, further workup for seizures. CT head showing a small infarct in the R cerebral hemisphere. PMH of chronic HFpEF, carotid stenosis, CVA with residual left-sided weakness, COPD, depression/anxiety, insulin -dependent type 2 diabetes, GERD, hypertension, hyperlipidemia, class III obesity    PT Comments  Pt received in supine and agreeable to session. Pt demonstrates impaired problem solving requiring increased cues during session. Pt able to perform bed mobility, transfers, and gait trial with up to min A and increased cues. Pt reports feeling hot during ambulation, but is able to complete trial without seated rest. Pt attempts a few steps with SPC, but demonstrates increased instability so pt utilized RW for the rest of the gait trial. Education provided on use of RW at home to reduce fall risk with pt verbalizing understanding. Pt continues to benefit from PT services to progress toward functional mobility goals.     If plan is discharge home, recommend the following: A little help with walking and/or transfers;A little help with bathing/dressing/bathroom;Assistance with cooking/housework;Assist for transportation;Help with stairs or ramp for entrance   Can travel by private vehicle     Yes  Equipment Recommendations  None recommended by PT    Recommendations for Other Services       Precautions / Restrictions Precautions Precautions: Fall Recall of Precautions/Restrictions: Intact Restrictions Weight Bearing Restrictions Per Provider Order: No     Mobility  Bed Mobility Overal bed mobility: Needs Assistance Bed Mobility: Supine to Sit     Supine to sit: Contact guard, HOB elevated, Used rails     General bed mobility comments: increased  time/effort and cues for sequencing    Transfers Overall transfer level: Needs assistance Equipment used: Straight cane Transfers: Sit to/from Stand Sit to Stand: Contact guard assist           General transfer comment: cues for hand placement and CGA for safety    Ambulation/Gait Ambulation/Gait assistance: Min assist, Contact guard assist Gait Distance (Feet): 115 Feet Assistive device: Rolling walker (2 wheels), Straight cane Gait Pattern/deviations: Step-through pattern, Trunk flexed, Decreased stride length, Steppage, Decreased dorsiflexion - right       General Gait Details: Pt initially using SPC, but demonstrates increased instability requiring min A. Pt demonstrates improved stability with CGA using RW. Frequent cues for upright posture. Pt demonstrates steppage gait for increased foot clearance due to decreased dorsiflexion and impaired command following.   Stairs             Wheelchair Mobility     Tilt Bed    Modified Rankin (Stroke Patients Only) Modified Rankin (Stroke Patients Only) Pre-Morbid Rankin Score: Moderate disability Modified Rankin: Moderately severe disability     Balance Overall balance assessment: Needs assistance Sitting-balance support: No upper extremity supported, Feet supported Sitting balance-Leahy Scale: Fair Sitting balance - Comments: sitting EOB   Standing balance support: Reliant on assistive device for balance, Bilateral upper extremity supported, During functional activity, Single extremity supported Standing balance-Leahy Scale: Poor Standing balance comment: Pt reliant on UE support                            Communication Communication Communication: No apparent difficulties Factors Affecting Communication: Hearing impaired  Cognition Arousal: Alert Behavior During Therapy: [facility] for tasks  assessed/performed   PT - Cognitive impairments: Awareness, Memory, Attention                          Following commands: Impaired Following commands impaired: Follows one step commands with increased time    Cueing Cueing Techniques: Verbal cues  Exercises      General Comments General comments (skin integrity, edema, etc.): Pt reports feeling hot during ambulation, but able to walk to the recliner without further symptoms      Pertinent Vitals/Pain Pain Assessment Pain Assessment: No/denies pain     PT Goals (current goals can now be found in the care plan section) Acute Rehab PT Goals Patient Stated Goal: did not state PT Goal Formulation: With patient Time For Goal Achievement: 07/02/24 Progress towards PT goals: Progressing toward goals    Frequency    Min 3X/week       AM-PAC PT 6 Clicks Mobility   Outcome Measure  Help needed turning from your back to your side while in a flat bed without using bedrails?: A Little Help needed moving from lying on your back to sitting on the side of a flat bed without using bedrails?: A Little Help needed moving to and from a bed to a chair (including a wheelchair)?: A Little Help needed standing up from a chair using your arms (e.g., wheelchair or bedside chair)?: A Little Help needed to walk in hospital room?: A Little Help needed climbing 3-5 steps with a railing? : A Lot 6 Click Score: 17    End of Session Equipment Utilized During Treatment: Gait belt Activity Tolerance: Patient tolerated treatment well;Patient limited by fatigue Patient left: with call bell/phone within reach;in chair;with family/visitor present Nurse Communication: Mobility status;Other (comment) (no chair alarm, but family present) PT Visit Diagnosis: Unsteadiness on feet (R26.81);Other abnormalities of gait and mobility (R26.89);Difficulty in walking, not elsewhere classified (R26.2)     Time: 8665-8596 PT Time Calculation (min) (ACUTE ONLY): 29 min  Charges:    $Gait Training: 23-37 mins PT General Charges $$ ACUTE PT VISIT: 1  Visit                     Darryle George, PTA Acute Rehabilitation Services Secure Chat Preferred  [phone]    Darryle George 06/19/2024, 3:41 PM

## 2024-06-19 NOTE — TOC Initial Note (Signed)
 Transition of Care [facility]) - Initial/Assessment Note    Patient Details  Name: Amber Harris MRN: [id number] Date of Birth: 1961/05/15  Transition of Care [facility]) CM/SW Contact:    Almarie CHRISTELLA Goodie, LCSW Phone Number: 06/19/2024, 2:41 PM  Clinical Narrative:     CSW met with patient with spouse and daughter at bedside to discuss recommendation for SNF. Patient and family at bedside refusing SNF, saying that they will take care of her at home. Patient's spouse does not live with patient but checks on her regular, says he will increase his availability at home. Daughter is also her aide and is there at least three hours every day, but will also be there more often until she's settled in at home. Patient already has RW at home, no other DME needed. Patient agreeable to [facility], preference for [facility]. [facility] sent referral and can accept. Information placed on AVS.               Expected Discharge Plan: Home w Home Health Services Barriers to Discharge: Continued Medical Work up   Patient Goals and CMS Choice Patient states their goals for this hospitalization and ongoing recovery are:: patient unable to participate in goal setting, not fully oriented CMS Medicare.gov Compare Post Acute Care list provided to:: Patient Represenative (must comment) Choice offered to / list presented to : Spouse, Adult Children      Expected Discharge Plan and Services     Post Acute Care Choice: Home Health Living arrangements for the past 2 months: Single Family Home                                      Prior Living Arrangements/Services Living arrangements for the past 2 months: Single Family Home Lives with:: Self Patient language and need for interpreter reviewed:: No Do you feel safe going back to the place where you live?: Yes      Need for Family Participation in Patient Care: Yes (Comment) Care giver support system in place?: Yes (comment)   Criminal Activity/Legal Involvement  Pertinent to Current Situation/Hospitalization: No - Comment as needed  Activities of Daily Living   ADL Screening (condition at time of admission) Independently performs ADLs?: No Does the patient have a NEW difficulty with bathing/dressing/toileting/self-feeding that is expected to last >3 days?: Yes (Initiates electronic notice to provider for possible OT consult) Does the patient have a NEW difficulty with getting in/out of bed, walking, or climbing stairs that is expected to last >3 days?: Yes (Initiates electronic notice to provider for possible PT consult) Does the patient have a NEW difficulty with communication that is expected to last >3 days?: No Is the patient deaf or have difficulty hearing?: No Does the patient have difficulty seeing, even when wearing glasses/contacts?: No Does the patient have difficulty concentrating, remembering, or making decisions?: Yes  Permission Sought/Granted                  Emotional Assessment Appearance:: Appears stated age Attitude/Demeanor/Rapport: Engaged Affect (typically observed): Appropriate Orientation: : Oriented to Self, Oriented to Place Alcohol / Substance Use: Not Applicable Psych Involvement: No (comment)  Admission diagnosis:  Seizure [facility]) [R56.9] Patient Active Problem List   Diagnosis Date Noted   Seizure (HCC) 06/16/2024   Hypotension 06/16/2024   Hypokalemia 06/16/2024   AKI (acute kidney injury) (HCC) 06/16/2024   Adenomatous polyp of transverse colon 02/14/2024   Adenomatous  polyp of ascending colon 02/14/2024   Loud snoring 08/25/2023   Chest pain 02/14/2021   Morbid obesity (HCC) 08/22/2015   Muscle weakness (generalized) 06/24/2014   Pain in joint, shoulder region 06/24/2014   Decreased range of motion of left shoulder 06/24/2014   Renal colic on right side 09/19/2013   Hyperlipidemia LDL goal <100 10/11/2012   Tobacco user    Aortic valve disease    Dyspnea on exertion    Palpitations     Depression with anxiety    Syncope    Diabetes mellitus, type II (HCC)    Hypertension    Obesity    PCP:  Orpha Yancey LABOR, MD Pharmacy:   [facility] [place] - [address] [place] [address] Phone: [phone] Fax: [phone]  [facility] - [place], [place] - [address] [place] [address] Phone: [phone] Fax: [phone]  [facility] - [place]  - [place], [place] - [address] [place] [address] Phone: [phone] Fax: [phone]     Social Drivers of Health (SDOH) Social History: SDOH Screenings   Food Insecurity: Patient Unable To Answer (06/16/2024)  Housing: Unknown (06/16/2024)  Transportation Needs: Patient Unable To Answer (06/16/2024)  Utilities: Patient Unable To Answer (06/16/2024)  Depression (PHQ2-9): Low Risk  (07/22/2023)  Financial Resource Strain: Low Risk  (07/30/2023)   [facility] from [facility]  Physical Activity: Insufficiently Active (09/27/2023)  Social Connections: Unknown (04/12/2022)   Received from [facility]  Stress: Stress Concern Present (05/26/2020)   Received from [facility]  Tobacco Use: Medium Risk (06/16/2024)  Health Literacy: Low Risk  (12/14/2023)   Received from [facility]   SDOH Interventions:     Readmission Risk Interventions     No data to display

## 2024-06-19 NOTE — Procedures (Signed)
 Patient Name: Amber Harris  MRN: [id number]  Epilepsy Attending: Arlin MALVA Krebs  Referring Physician/Provider: everitt Clint Abbey Earle FORBES, NP  Duration: 06/18/2024 9454 to 06/19/2024 0545   Patient history:  63 y.o. female with hx of stroke with residual left-sided weakness, right carotid stenosis who presents with an episode of decreased responsiveness with abnormal mouth movements while at a family gathering. EEG to evaluate for seizure   Level of alertness: Awake, asleep   AEDs during EEG study: None   Technical aspects: This EEG study was done with scalp electrodes positioned according to the 10-20 International system of electrode placement. Electrical activity was reviewed with band pass filter of 1-70Hz , sensitivity of 7 uV/mm, display speed of 35mm/sec with a 60Hz  notched filter applied as appropriate. EEG data were recorded continuously and digitally stored.  Video monitoring was available and reviewed as appropriate.   Description: The posterior dominant rhythm consists of 9-10 Hz activity of moderate voltage (25-35 uV) seen predominantly in posterior head regions, symmetric and reactive to eye opening and eye closing. Sleep was characterized by vertex waves, sleep spindles (12 to 14 Hz), maximal frontocentral region. Hyperventilation and photic stimulation were not performed.     One event was recorded on 06/18/2024 at  0754.  Hospitalist was examining the patient and noticed that patient had lipsmacking, repetitive wording and was not able to cross her eyes to the right side.  Concomitant EEG before, during and after the event did not show any EEG change to suggest seizure. Because   IMPRESSION: This study is within normal limits. No seizures or epileptiform discharges were seen throughout the recording.   One event was recorded on 06/18/2024 at  0754 as described above without concomitant EEG change.  This was most likely not an epileptic event.   A normal interictal EEG does not  exclude the diagnosis of epilepsy.    Derreck Wiltsey O Jamilya Sarrazin

## 2024-06-19 NOTE — Progress Notes (Signed)
 LTM maint complete - no skin breakdown under: FZ,CZ,PZ

## 2024-06-19 NOTE — Procedures (Addendum)
 Patient Name: Amber Harris  MRN: [id number]  Epilepsy Attending: Arlin MALVA Krebs  Referring Physician/Provider: everitt Clint Abbey Earle FORBES, NP  Duration: 06/19/2024 9454 to 06/19/2024 9072   Patient history:  63 y.o. female with hx of stroke with residual left-sided weakness, right carotid stenosis who presents with an episode of decreased responsiveness with abnormal mouth movements while at a family gathering. EEG to evaluate for seizure   Level of alertness: Awake, asleep   AEDs during EEG study: None   Technical aspects: This EEG study was done with scalp electrodes positioned according to the 10-20 International system of electrode placement. Electrical activity was reviewed with band pass filter of 1-70Hz , sensitivity of 7 uV/mm, display speed of 31mm/sec with a 60Hz  notched filter applied as appropriate. EEG data were recorded continuously and digitally stored.  Video monitoring was available and reviewed as appropriate.   Description: The posterior dominant rhythm consists of 9-10 Hz activity of moderate voltage (25-35 uV) seen predominantly in posterior head regions, symmetric and reactive to eye opening and eye closing. Sleep was characterized by vertex waves, sleep spindles (12 to 14 Hz), maximal frontocentral region. Hyperventilation and photic stimulation were not performed.     One event was recorded on 06/19/2024 at  0812. Patient had eye flutter, chewing movements.  Per RN, patient also reported decrease sensation on the left leg.  Concomitant EEG before, during and after the event did not show any EEG change to suggest seizure. Because   IMPRESSION: This study is within normal limits. No seizures or epileptiform discharges were seen throughout the recording.  One event was recorded on 06/19/2024 at 0812 as described above without concomitant EEG change.  This was most likely not an epileptic event.   A normal interictal EEG does not exclude the diagnosis of epilepsy.      Baruc Tugwell O Catherina Pates

## 2024-06-19 NOTE — Progress Notes (Signed)
 Pt. had a staring episode and lip smacking at around 815 am, was answering questions, following commands, CBG 120, vital signs stable, she states she felt little less sensation in her left side as compared to right, felt funny when touched her  left leg, MD informed, will continue to monitor

## 2024-06-20 ENCOUNTER — Telehealth: Payer: Self-pay

## 2024-06-20 NOTE — Transitions of Care (Post Inpatient/ED Visit) (Signed)
   06/20/2024  Name: Amber Harris MRN: [id number] DOB: [birth year]  Today's TOC FU Call Status: Today's TOC FU Call Status:: Unsuccessful Call (1st Attempt) Unsuccessful Call (1st Attempt) Date: 06/20/24  Attempted to reach the patient regarding the most recent Inpatient/ED visit.  Follow Up Plan: Additional outreach attempts will be made to reach the patient to complete the Transitions of Care (Post Inpatient/ED visit) call.   [name] J. Rokia Bosket RN, MSN [facility], [facility] RN Care Manager Direct Dial: [phone]  Fax: [phone] Website: [email or web address]

## 2024-06-21 ENCOUNTER — Telehealth: Payer: Self-pay

## 2024-06-21 NOTE — Transitions of Care (Post Inpatient/ED Visit) (Signed)
 06/21/2024  Patient ID: Amber Harris, female   DOB: 1961/02/17, 63 y.o.   MRN: [id number]  Return call to [facility], RN of [facility]. She reports that she has been trying to contact the patient for home health services and also has been unsuccessful. Reviewed contact information for patient.  We both will continue to outreach patient and notify if either is successful with outreach.    Dacota Devall J. Chang Tiggs RN, MSN [facility], [facility] Health RN Care Manager Direct Dial: [phone]  Fax: [phone] Website: [email or web address]

## 2024-06-21 NOTE — Transitions of Care (Post Inpatient/ED Visit) (Signed)
   06/21/2024  Name: Amber Harris MRN: [id number] DOB: 07-Sep-1961  Today's TOC FU Call Status: Today's TOC FU Call Status:: Unsuccessful Call (2nd Attempt) Unsuccessful Call (2nd Attempt) Date: 06/21/24  Attempted to reach the patient regarding the most recent Inpatient/ED visit.  Follow Up Plan: Additional outreach attempts will be made to reach the patient to complete the Transitions of Care (Post Inpatient/ED visit) call.   Deserea Bordley J. Earlena Werst RN, MSN [facility], [facility] Health RN Care Manager Direct Dial: [phone]  Fax: [phone] Website: [email or web address]

## 2024-06-22 ENCOUNTER — Telehealth: Payer: Self-pay

## 2024-06-22 NOTE — Transitions of Care (Post Inpatient/ED Visit) (Signed)
 06/22/2024  Name: Amber Harris MRN: [id number] DOB: 04/10/1961  Today's TOC FU Call Status: Today's TOC FU Call Status:: Successful TOC FU Call Completed TOC FU Call Complete Date: 06/22/24 Patient's Name and Date of Birth confirmed.  Transition Care Management Follow-up Telephone Call Date of Discharge: 06/19/24 Discharge Facility: [facility]) Type of Discharge: Inpatient Admission Primary Inpatient Discharge Diagnosis:: seizures How have you been since you were released from the hospital?: Better (feeling ok- no episodes) Any questions or concerns?: No  Items Reviewed: Did you receive and understand the discharge instructions provided?: Yes Medications obtained,verified, and reconciled?: Yes (Medications Reviewed) Any new allergies since your discharge?: No Dietary orders reviewed?: NA Do you have support at home?: Yes People in Home [RPT]: significant other  Medications Reviewed Today: Medications Reviewed Today     Reviewed by Rumalda Alan PENNER, RN (Registered Nurse) on 06/22/24 at 1000  Med List Status: <None>   Medication Order Taking? Sig Documenting Provider Last Dose Status Informant  albuterol  (PROVENTIL ,VENTOLIN ) 90 MCG/ACT inhaler [id number] Yes Inhale 2 puffs into the lungs every 6 (six) hours as needed for wheezing or shortness of breath. [provider]  Active Self, Pharmacy Records  aspirin  EC 81 MG tablet [id number] Yes Take 81 mg by mouth daily. [provider]  Active Self, Pharmacy Records  buprenorphine [facility]) 5 MCG/HR [place] [id number] Yes Place 1 patch onto the skin every Sunday. [provider]  Active Self, Pharmacy Records           Med Note (COFFELL, JON HERO   Mon Jun 18, 2024 11:05 AM) Per dispense report, LF 06/14/24 #4, 28 DS. Pt states she applied the 1st patch on 06/17/24 AM.  chlorthalidone  (HYGROTON ) 50 MG tablet [id number] Yes Take 50 mg by mouth daily. [provider]  Active Self, Pharmacy Records   citalopram (CELEXA) 20 MG tablet [id number] Yes Take 20 mg by mouth daily. [provider]  Active Self, Pharmacy Records  clopidogrel  (PLAVIX ) 75 MG tablet [id number] Yes Take 75 mg by mouth daily. [provider]  Active Self, Pharmacy Records  Continuous Glucose Sensor (DEXCOM G7 SENSOR) [place] [id number] Yes CHANGE EVERY 10 DAYS [provider]  Active Self, Pharmacy Records  ezetimibe  (ZETIA ) 10 MG tablet [id number] Yes TAKE 1 TABLET BY MOUTH ONCE DAILY Branch, Dorn FALCON, MD  Active Self, Pharmacy Records  furosemide  (LASIX ) 20 MG tablet [id number] Yes Take 20 mg by mouth daily.  Patient taking differently: Take 20 mg by mouth daily.   [provider]  Active Self, Pharmacy Records  gabapentin  (NEURONTIN ) 300 MG capsule [id number] Yes Take 300 mg by mouth 3 (three) times daily. [provider]  Active Self, Pharmacy Records  hydrALAZINE  (APRESOLINE ) 50 MG tablet [id number] Yes TAKE 1 TABLET BY MOUTH TWICE DAILY Branch, Dorn FALCON, MD  Active Self, Pharmacy Records  Insulin  Disposable Pump (OMNIPOD DASH PODS, GEN 4,) MISC [id number] Yes Change every 48 hours [provider]  Active Self, Pharmacy Records  insulin  lispro (HUMALOG) 100 UNIT/ML injection [id number] Yes Inject 100 Units into the skin continuous. Max daily dose of 100 units per day via insulin  pump. [provider]  Active Self, Pharmacy Records           Med Note (COFFELL, JON HERO   Mon Jun 18, 2024 11:08 AM) Pump removed this AM (06/18/24)   JARDIANCE  25 MG TABS tablet [id number] Yes Take 25 mg by mouth daily.  [provider]  Active Self, Pharmacy Records  Med Note JERALYN DUNCANS A   Wed Sep 24, 2020  3:53 PM)    lamoTRIgine  (LAMICTAL  XR) 50 MG 24 hour tablet [id number]  Take 1 tablet (50 mg total) by mouth daily.  Patient not taking: Reported on 06/22/2024   Gherghe, Costin M, MD  Active   lamoTRIgine  (LAMICTAL ) 25 MG tablet [id number] Yes Take 1 tablet (25  mg total) by mouth daily. 25 mg daily x 10 days, then 50 mg daily for 10 days Gherghe, Costin M, MD  Active   methocarbamol  (ROBAXIN ) 500 MG tablet [id number] Yes Take 500 mg by mouth at bedtime. [provider]  Active Self, Pharmacy Records  olmesartan (BENICAR) 40 MG tablet [id number] Yes Take 40 mg by mouth daily. [provider]  Active Self, Pharmacy Records  omeprazole  (PRILOSEC) 20 MG capsule [id number] Yes Take 1 capsule (20 mg total) by mouth daily. Alvan Dorn FALCON, MD  Active Self, Pharmacy Records  oxybutynin (DITROPAN-XL) 5 MG 24 hr tablet [id number] Yes Take 5 mg by mouth at bedtime. [provider]  Active Self, Pharmacy Records  risperiDONE  (RISPERDAL ) 0.5 MG tablet [id number] Yes Take 0.5 mg by mouth at bedtime. [provider]  Active Self, Pharmacy Records  rosuvastatin  (CRESTOR ) 40 MG tablet [id number] Yes TAKE 1 TABLET BY MOUTH EVERY DAY AT BEDTIME Branch, Dorn FALCON, MD  Active Self, Pharmacy Records            Home Care and Equipment/Supplies: Were Home Health Services Ordered?: Yes Name of Home Health Agency:: [phone] Has Agency set up a time to come to your home?: Yes First Home Health Visit Date:  (patient spoke with agency on 06/21/2024) Any new equipment or medical supplies ordered?: No  Functional Questionnaire: Do you need assistance with bathing/showering or dressing?: Yes (boyfriend) Do you need assistance with meal preparation?: Yes (aid) Do you need assistance with eating?: No Do you have difficulty maintaining continence: Yes (diapers) Do you need assistance with getting out of bed/getting out of a chair/moving?: Yes (walker or caregiver) Do you have difficulty managing or taking your medications?: No (reports she had pill pack and can take them independently.)  Follow up appointments reviewed: PCP Follow-up appointment confirmed?: Yes Date of PCP follow-up appointment?: 06/26/24 Follow-up Provider: PCP Specialist  Hospital Follow-up appointment confirmed?: No Reason Specialist Follow-Up Not Confirmed: Patient has Specialist Provider Number and will Call for Appointment Do you need transportation to your follow-up appointment?: Yes Transportation Need Intervention Addressed By:: Transportation Arranged (with [facility]) Do you understand care options if your condition(s) worsen?: Yes-patient verbalized understanding  SDOH Interventions Today    Flowsheet Row Most Recent Value  SDOH Interventions   Food Insecurity Interventions Intervention Not Indicated, Other (Comment)  [patient denies needing help with food. States that her boyfriend helps her with food if she needs food.]  Housing Interventions Intervention Not Indicated  Transportation Interventions Intervention Not Indicated, Other (Comment)  [uses [facility]]  Utilities Interventions Intervention Not Indicated      Goals Addressed             This Visit's Progress    [facility] Transitions of Care (TOC) Care Plan       Problems:  Recent Hospitalization for treatment of ? New onset of seizure  06/22/2024  No seizure activity since hospital discharge.  Patient has spoke with home health nurse on 06/21/2024.  Has a caregiver ( PCS) 2-5 daily. Meds are pre pack. Has a boyfriend that assist patient with whatever she needs, i.e. food, rent,  bathing and companionship.  Medication management barrier patient has not started her Lamictal  yet and will pick up today.  Patient request assistance with finding a foot doctor  Goal:  Over the next 30 days, the patient will not experience hospital readmission  Interventions:  Transitions of Care: Doctor Visits  - discussed the importance of doctor visits Reviewed PCP follow up next week. Patient has contacted [facility] for transportation and this is all set up. Encouraged patient to talk to PCP about a foot doctor. Reviewed DM control and encouraged patient to continue to be mindful on monitoring her dexacom and insulin   pump. Reviewed ability to take her medications independently. She uses pre fill med packs and appears very knowledgeable about how to take her medications.  Reviewed care giver that patient has. Caregiver assist with meals and bathing and medication adherence Reviewed food benefits with insurance carrier and patient reports she will call [employer] and get her discharge meals set up Reviewed with patient that home health is trying to reach her and she states that she spoke with them yesterday and will call the again today. Reviewed importance of picking up her medications today and to start taking them as prescribed. Reviewed days where patient is supposed to change her dose and she verbalized understanding.  Reviewed and offered 30 day TOC program and patient agreed.  I provided patient with my contact information as well as assigned TOC nurse , Dionne [phone]. Patient wrote down this information. Next telephone visit scheduled.   Patient Self Care Activities:  Attend all scheduled provider appointments Call pharmacy for medication refills 3-7 days in advance of running out of medications Call provider office for new concerns or questions  Notify RN Care Manager of TOC call rescheduling needs Participate in Transition of Care Program/Attend TOC scheduled calls Take medications as prescribed   Pick up new  meds today See primary MD next week as planned. Ask about a foot doctor referral Seek emergency care if needed. Notify someone if you have concerns about food.   Plan:  Telephone follow up appointment with care management team member scheduled for:  06/29/2024       Patient reports that she is doing well. Reports that she has a caregiver daily from 2-5pm that helps her with whatever she needs.  Has a supportive significant other.  Patient reports that she gets food stamps and if she runs low in food her boyfriend provides for her.  Denies any help needed today with food  insecurity.  Patient is taking her medications as prescribed and will pick up new medications today with her caregiver.  Reports no additional seizures or odd feeling since hospital discharge.  Reports she has scheduled her own PCP follow up and [facility] transportation.    Denies needing any assistance today. Reports she will call home health nurse back after speaking with nurse yesterday. States she will proceed with PT and ST.  Reviewed and offered 30 day TOC program and patient has consented.   This note routed to MD. Alan Ee, RN, BSN, CEN Population Health- Transition of Care Team.  [facility] [phone]

## 2024-06-26 DIAGNOSIS — Z556 Problems related to health literacy: Secondary | ICD-10-CM | POA: Diagnosis not present

## 2024-06-26 DIAGNOSIS — N179 Acute kidney failure, unspecified: Secondary | ICD-10-CM | POA: Diagnosis not present

## 2024-06-26 DIAGNOSIS — I110 Hypertensive heart disease with heart failure: Secondary | ICD-10-CM | POA: Diagnosis not present

## 2024-06-26 DIAGNOSIS — Z7984 Long term (current) use of oral hypoglycemic drugs: Secondary | ICD-10-CM | POA: Diagnosis not present

## 2024-06-26 DIAGNOSIS — Z7902 Long term (current) use of antithrombotics/antiplatelets: Secondary | ICD-10-CM | POA: Diagnosis not present

## 2024-06-26 DIAGNOSIS — R569 Unspecified convulsions: Secondary | ICD-10-CM | POA: Diagnosis not present

## 2024-06-26 DIAGNOSIS — E876 Hypokalemia: Secondary | ICD-10-CM | POA: Diagnosis not present

## 2024-06-26 DIAGNOSIS — I69354 Hemiplegia and hemiparesis following cerebral infarction affecting left non-dominant side: Secondary | ICD-10-CM | POA: Diagnosis not present

## 2024-06-26 DIAGNOSIS — Z794 Long term (current) use of insulin: Secondary | ICD-10-CM | POA: Diagnosis not present

## 2024-06-26 DIAGNOSIS — I5032 Chronic diastolic (congestive) heart failure: Secondary | ICD-10-CM | POA: Diagnosis not present

## 2024-06-26 DIAGNOSIS — I700 Atherosclerosis of aorta: Secondary | ICD-10-CM | POA: Diagnosis not present

## 2024-06-26 DIAGNOSIS — N23 Unspecified renal colic: Secondary | ICD-10-CM | POA: Diagnosis not present

## 2024-06-26 DIAGNOSIS — I359 Nonrheumatic aortic valve disorder, unspecified: Secondary | ICD-10-CM | POA: Diagnosis not present

## 2024-06-26 DIAGNOSIS — E119 Type 2 diabetes mellitus without complications: Secondary | ICD-10-CM | POA: Diagnosis not present

## 2024-06-26 DIAGNOSIS — J449 Chronic obstructive pulmonary disease, unspecified: Secondary | ICD-10-CM | POA: Diagnosis not present

## 2024-06-26 DIAGNOSIS — I959 Hypotension, unspecified: Secondary | ICD-10-CM | POA: Diagnosis not present

## 2024-06-26 DIAGNOSIS — Z5982 Transportation insecurity: Secondary | ICD-10-CM | POA: Diagnosis not present

## 2024-06-26 DIAGNOSIS — Z7982 Long term (current) use of aspirin: Secondary | ICD-10-CM | POA: Diagnosis not present

## 2024-06-26 DIAGNOSIS — E785 Hyperlipidemia, unspecified: Secondary | ICD-10-CM | POA: Diagnosis not present

## 2024-06-26 DIAGNOSIS — Z860101 Personal history of adenomatous and serrated colon polyps: Secondary | ICD-10-CM | POA: Diagnosis not present

## 2024-06-29 ENCOUNTER — Telehealth: Payer: Self-pay

## 2024-07-03 DIAGNOSIS — E876 Hypokalemia: Secondary | ICD-10-CM | POA: Diagnosis not present

## 2024-07-19 ENCOUNTER — Other Ambulatory Visit: Payer: Self-pay | Admitting: Cardiology

## 2024-07-26 DIAGNOSIS — Z7409 Other reduced mobility: Secondary | ICD-10-CM | POA: Diagnosis not present

## 2024-07-26 DIAGNOSIS — R296 Repeated falls: Secondary | ICD-10-CM | POA: Diagnosis not present

## 2024-07-26 DIAGNOSIS — I10 Essential (primary) hypertension: Secondary | ICD-10-CM | POA: Diagnosis not present

## 2024-07-26 DIAGNOSIS — M6281 Muscle weakness (generalized): Secondary | ICD-10-CM | POA: Diagnosis not present

## 2024-07-26 DIAGNOSIS — R5383 Other fatigue: Secondary | ICD-10-CM | POA: Diagnosis not present

## 2024-07-26 DIAGNOSIS — J449 Chronic obstructive pulmonary disease, unspecified: Secondary | ICD-10-CM | POA: Diagnosis not present

## 2024-07-26 DIAGNOSIS — I679 Cerebrovascular disease, unspecified: Secondary | ICD-10-CM | POA: Diagnosis not present

## 2024-07-26 DIAGNOSIS — Z515 Encounter for palliative care: Secondary | ICD-10-CM | POA: Diagnosis not present

## 2024-08-15 DIAGNOSIS — M5459 Other low back pain: Secondary | ICD-10-CM | POA: Diagnosis not present

## 2024-08-15 DIAGNOSIS — Z8673 Personal history of transient ischemic attack (TIA), and cerebral infarction without residual deficits: Secondary | ICD-10-CM | POA: Diagnosis not present

## 2024-08-15 DIAGNOSIS — I10 Essential (primary) hypertension: Secondary | ICD-10-CM | POA: Diagnosis not present

## 2024-08-15 DIAGNOSIS — E7849 Other hyperlipidemia: Secondary | ICD-10-CM | POA: Diagnosis not present

## 2024-08-15 DIAGNOSIS — Z0000 Encounter for general adult medical examination without abnormal findings: Secondary | ICD-10-CM | POA: Diagnosis not present

## 2024-08-15 DIAGNOSIS — N182 Chronic kidney disease, stage 2 (mild): Secondary | ICD-10-CM | POA: Diagnosis not present

## 2024-08-15 DIAGNOSIS — M5432 Sciatica, left side: Secondary | ICD-10-CM | POA: Diagnosis not present

## 2024-08-15 DIAGNOSIS — E1122 Type 2 diabetes mellitus with diabetic chronic kidney disease: Secondary | ICD-10-CM | POA: Diagnosis not present

## 2024-08-20 ENCOUNTER — Other Ambulatory Visit: Payer: Self-pay | Admitting: Cardiology

## 2024-08-22 DIAGNOSIS — Z1231 Encounter for screening mammogram for malignant neoplasm of breast: Secondary | ICD-10-CM | POA: Diagnosis not present

## 2024-09-17 ENCOUNTER — Ambulatory Visit: Admitting: Cardiology

## 2024-09-17 NOTE — Progress Notes (Deleted)
 Clinical Summary Amber Harris is a 63 y.o.female  seen today for follow up of the following medical problems.      1. HTN    01/2023 renal artery US : no significant stenosis 2013 sleep study: no OSA. - normal aldo levels - loud snoring, no apneic episodes, +daytime somnolence.    - referred to pulm last visit due to signs and symptoms of OSA -  home bp's 130s/60s - compliant withmeds     2. Hyperlipidemia   - she is on crestor  40mg  daily.  - 10/2022 TC 166 TG 86 HDL 64 LDL 86 05/2023 TC 89 TG 45 LDL 22 HDL 58 - 10/2023 TC 113 TG 101 HDL 54 LDL 40   4. Chronic diastolic HF  -occasional LE edema, overall controlled - mild edema, rare infrequent - no SOb/DOE   5. History of CVA - admitted [facility] about 1 year ago - in hospital for about 1 week per her report - residual left sided weakness   04/2020 CT head [facility] old lacunar infarct   - suspecte TIA during 01/2021 admission  - admit 05/2023 with CVA [facility]. Imaging was benign  - plavix  was added at that time.   - admit 05/2024 with seizure -      6. Chest pain -01/2021 admission with chest pain - 01/2021 cath without significant disease - still with chest pains at times - sharp pain at times midchest, worst with deep breathing. Lasted about 4.5 hours. Can awake from sleep.  - no recent symptoms   - admit 10/2023 with recurrent CVA     6. Carotid stenosis - carotid US  2023 RICA 1-39%, LICA no stenosis   7. Aortic regurgitation - 05/2023 echo [facility]: LVEF >55%, mild to mod AI - 10/2023 LVEF 55-60%, no WMAs, mod AI Past Medical History:  Diagnosis Date   Aortic valve disease    Long-standing systolic murmur   Asthma    Uses p.r.n. albuterol    Bronchitis    COPD (chronic obstructive pulmonary disease) (HCC)    Degenerative joint disease    right knee   Depression    Depression with anxiety    Diabetes mellitus    Dyspnea    exertion   Dyspnea on exertion    poor exercise tolerance   Fibroids     uterine; postmenopausal bleeding   GERD (gastroesophageal reflux disease)    Headache(784.0)    Hyperlipidemia    Hypertension    Myocardial infarction (HCC)    Obesity    Palpitations    Stroke (HCC)    TIA - left side residual   Syncope    exertional   Tobacco user    30 pack years; 04/2011: 1/4 pack per day during quick attempt     Allergies  Allergen Reactions   Bayer Aspirin  [Aspirin ] Itching    Reaction to 325mg  or higher - OK to take low dose ASA daily.   Penicillins Rash     Current Outpatient Medications  Medication Sig Dispense Refill   albuterol  (PROVENTIL ,VENTOLIN ) 90 MCG/ACT inhaler Inhale 2 puffs into the lungs every 6 (six) hours as needed for wheezing or shortness of breath.     aspirin  EC 81 MG tablet Take 81 mg by mouth daily.     buprenorphine (BUTRANS) 5 MCG/HR PTWK Place 1 patch onto the skin every Sunday.     chlorthalidone  (HYGROTON ) 50 MG tablet Take 50 mg by mouth daily.  citalopram (CELEXA) 20 MG tablet Take 20 mg by mouth daily.     clopidogrel  (PLAVIX ) 75 MG tablet Take 75 mg by mouth daily.     Continuous Glucose Sensor (DEXCOM G7 SENSOR) MISC CHANGE EVERY 10 DAYS     ezetimibe  (ZETIA ) 10 MG tablet TAKE 1 TABLET BY MOUTH ONCE DAILY 30 tablet 10   furosemide  (LASIX ) 20 MG tablet TAKE 1 TABLET BY MOUTH DAILY AS NEEDED FOR SWELLING 30 tablet 3   gabapentin  (NEURONTIN ) 300 MG capsule Take 300 mg by mouth 3 (three) times daily.     hydrALAZINE  (APRESOLINE ) 50 MG tablet TAKE 1 TABLET BY MOUTH TWICE DAILY *PATIENT NEEDS APPOINTMENT FOR FURTHER REFILLS PLEASE HAVE PATIENT CONTACT OUR OFFICE [phone]* 180 tablet 3   Insulin  Disposable Pump (OMNIPOD DASH PODS, GEN 4,) MISC Change every 48 hours     insulin  lispro (HUMALOG) 100 UNIT/ML injection Inject 100 Units into the skin continuous. Max daily dose of 100 units per day via insulin  pump.     JARDIANCE  25 MG TABS tablet Take 25 mg by mouth daily.      [START ON 07/09/2025] lamoTRIgine  (LAMICTAL  XR) 50  MG 24 hour tablet Take 1 tablet (50 mg total) by mouth daily. (Patient not taking: Reported on 06/22/2024) 30 tablet 0   lamoTRIgine  (LAMICTAL ) 25 MG tablet Take 1 tablet (25 mg total) by mouth daily. 25 mg daily x 10 days, then 50 mg daily for 10 days 30 tablet 0   methocarbamol  (ROBAXIN ) 500 MG tablet Take 500 mg by mouth at bedtime.     olmesartan (BENICAR) 40 MG tablet Take 40 mg by mouth daily.     omeprazole  (PRILOSEC) 20 MG capsule Take 1 capsule (20 mg total) by mouth daily. 90 capsule 1   oxybutynin (DITROPAN-XL) 5 MG 24 hr tablet Take 5 mg by mouth at bedtime.     risperiDONE  (RISPERDAL ) 0.5 MG tablet Take 0.5 mg by mouth at bedtime.     rosuvastatin  (CRESTOR ) 40 MG tablet TAKE 1 TABLET BY MOUTH EVERY DAY AT BEDTIME 90 tablet 2   No current facility-administered medications for this visit.     Past Surgical History:  Procedure Laterality Date   ABDOMINAL HYSTERECTOMY  12/21/2011   Procedure: HYSTERECTOMY ABDOMINAL;  Surgeon: Norleen LULLA Server, MD;  Location: [facility];  Service: Gynecology;  Laterality: N/A;   CATARACT EXTRACTION W/PHACO Left 10/03/2020   Procedure: CATARACT EXTRACTION PHACO AND INTRAOCULAR LENS PLACEMENT LEFT EYE;  Surgeon: Harrie Agent, MD;  Location: [facility];  Service: Ophthalmology;  Laterality: Left;  CDE 5.04   CATARACT EXTRACTION W/PHACO Right 10/17/2020   Procedure: CATARACT EXTRACTION PHACO AND INTRAOCULAR LENS PLACEMENT RIGHT EYE;  Surgeon: Harrie Agent, MD;  Location: [facility];  Service: Ophthalmology;  Laterality: Right;  CDE: 4.60   COLONOSCOPY WITH PROPOFOL  N/A 02/14/2024   Procedure: COLONOSCOPY WITH PROPOFOL ;  Surgeon: Cinderella Deatrice FALCON, MD;  Location: [facility];  Service: Endoscopy;  Laterality: N/A;  9:45AM;ASA 3   LEFT HEART CATH AND CORONARY ANGIOGRAPHY N/A 02/17/2021   Procedure: LEFT HEART CATH AND CORONARY ANGIOGRAPHY;  Surgeon: Wonda Sharper, MD;  Location: [facility] INVASIVE CV LAB;  Service: Cardiovascular;  Laterality: N/A;   POLYPECTOMY  02/14/2024    Procedure: POLYPECTOMY;  Surgeon: Cinderella Deatrice FALCON, MD;  Location: [facility];  Service: Endoscopy;;   SUBMUCOSAL INJECTION  02/14/2024   Procedure: INJECTION, SUBMUCOSAL;  Surgeon: Cinderella Deatrice FALCON, MD;  Location: [facility];  Service: Endoscopy;;   TUBAL LIGATION  Allergies  Allergen Reactions   Bayer Aspirin  [Aspirin ] Itching    Reaction to 325mg  or higher - OK to take low dose ASA daily.   Penicillins Rash      Family History  Problem Relation Age of Onset   Lung cancer Mother    Heart disease Brother    Breast cancer Sister    Heart disease Maternal Aunt    Cancer Cousin        lung   Anesthesia problems Neg Hx    Hypotension Neg Hx    Malignant hyperthermia Neg Hx    Pseudochol deficiency Neg Hx      Social History Amber Harris reports that she quit smoking about 17 months ago. Her smoking use included cigarettes. She started smoking about 49 years ago. She has a 24.3 pack-year smoking history. She has never used smokeless tobacco. Amber Harris reports no history of alcohol use.   Review of Systems CONSTITUTIONAL: No weight loss, fever, chills, weakness or fatigue.  HEENT: Eyes: No visual loss, blurred vision, double vision or yellow sclerae.No hearing loss, sneezing, congestion, runny nose or sore throat.  SKIN: No rash or itching.  CARDIOVASCULAR:  RESPIRATORY: No shortness of breath, cough or sputum.  GASTROINTESTINAL: No anorexia, nausea, vomiting or diarrhea. No abdominal pain or blood.  GENITOURINARY: No burning on urination, no polyuria NEUROLOGICAL: No headache, dizziness, syncope, paralysis, ataxia, numbness or tingling in the extremities. No change in bowel or bladder control.  MUSCULOSKELETAL: No muscle, back pain, joint pain or stiffness.  LYMPHATICS: No enlarged nodes. No history of splenectomy.  PSYCHIATRIC: No history of depression or anxiety.  ENDOCRINOLOGIC: No reports of sweating, cold or heat intolerance. No polyuria or polydipsia.   SABRA   Physical Examination There were no vitals filed for this visit. There were no vitals filed for this visit.  Gen: resting comfortably, no acute distress HEENT: no scleral icterus, pupils equal round and reactive, no palptable cervical adenopathy,  CV Resp: Clear to auscultation bilaterally GI: abdomen is soft, non-tender, non-distended, normal bowel sounds, no hepatosplenomegaly MSK: extremities are warm, no edema.  Skin: warm, no rash Neuro:  no focal deficits Psych: appropriate affect   Diagnostic Studies  10/2012 Event monitor: no arrhythmias     08/2010 Echo:  Left ventricle: The cavity size was normal. Wall thickness was   normal. Systolic function was normal. The estimated ejection   fraction was in the range of 60% to 65%. Wall motion was normal;   there were no regional wall motion abnormalities. The study is not   technically sufficient to allow evaluation of LV diastolic   function.   - Aortic valve: Mildly calcified annulus. Mild regurgitation.   - Mitral valve: Mild regurgitation.   - Tricuspid valve: Mild regurgitation.   - Pericardium, extracardiac: There was no pericardial effusion.     11/14/13 Clinic EKG   Sinus rhythm     10/2013 Echo   LVEF 60-65%, grade II diastolic dysfunction, mild AI, mild MR     01/23/14 Clinic EKG   NSR, LAE, no ischemic changes   01/2021 echo IMPRESSIONS     1. Left ventricular ejection fraction, by estimation, is 60 to 65%. The  left ventricle has normal function. The left ventricle has no regional  wall motion abnormalities. There is moderate concentric left ventricular  hypertrophy. Left ventricular  diastolic parameters are indeterminate. Elevated left ventricular  end-diastolic pressure.   2. Right ventricular systolic function is normal. The right ventricular  size is  normal. Tricuspid regurgitation signal is inadequate for assessing  PA pressure.   3. The mitral valve is normal in structure. Trivial mitral  valve  regurgitation. No evidence of mitral stenosis.   4. The aortic valve is calcified. Aortic valve regurgitation is mild.  Mild to moderate aortic valve sclerosis/calcification is present, without  any evidence of aortic stenosis. Focal dense calcification of the left  coornary cusp is present. Aortic  regurgitation PHT measures 543 msec.   5. The inferior vena cava is dilated in size with <50% respiratory  variability, suggesting right atrial pressure of 15 mmHg.   01/2021 cath 1. Widely patent coronary arteries with no obstructive CAD 2. Normal LV function     05/2023 echo [facility] Summary   1. The left ventricle is normal in size with normal wall thickness.    2. The left ventricular systolic function is normal, LVEF is visually  estimated at > 55%.    3. There is mild to moderate aortic regurgitation.    4. The right ventricle is normal in size, with normal systolic function.    5. There is no evidence of an interatrial flow communication or  intrapulmonary shunt by agitated saline study.    Assessment and Plan  Resistant HTN -well controlled, continue current meds   2. Hyperlipidemia   -LDL is at goal, due to multiple CVAs goal LDL would be <55   3. Chronic diastolic heart failure   -no symptoms, euvolemic today - continue current meds      Dorn PHEBE Ross, M.D., F.A.C.C.

## 2024-10-17 ENCOUNTER — Other Ambulatory Visit: Payer: Self-pay | Admitting: Cardiology

## 2024-10-24 ENCOUNTER — Ambulatory Visit: Attending: Cardiology | Admitting: Cardiology

## 2024-10-24 NOTE — Progress Notes (Deleted)
 Clinical Summary Amber Harris is a 63 y.o.female  seen today for follow up of the following medical problems.      1. HTN    01/2023 renal artery US : no significant stenosis 2013 sleep study: no OSA. - normal aldo levels - loud snoring, no apneic episodes, +daytime somnolence.    - referred to pulm last visit due to signs and symptoms of OSA -  home bp's 130s/60s - compliant withmeds     2. Hyperlipidemia   - she is on crestor  40mg  daily.  - 10/2022 TC 166 TG 86 HDL 64 LDL 86 05/2023 TC 89 TG 45 LDL 22 HDL 58 - 10/2023 TC 113 TG 101 HDL 54 LDL 40   4. Chronic diastolic HF  -occasional LE edema, overall controlled - mild edema, rare infrequent - no SOb/DOE   5. History of CVA - admitted [facility] about 1 year ago - in hospital for about 1 week per her report - residual left sided weakness   04/2020 CT head [facility] old lacunar infarct   - suspecte TIA during 01/2021 admission  - admit 05/2023 with CVA [facility]. Imaging was benign  - plavix  was added at that time.      6. Chest pain -01/2021 admission with chest pain - 01/2021 cath without significant disease - still with chest pains at times - sharp pain at times midchest, worst with deep breathing. Lasted about 4.5 hours. Can awake from sleep.  - no recent symptoms   - admit 10/2023 with recurrent CVA     6. Carotid stenosis - carotid US  2023 RICA 1-39%, LICA no stenosis   7. Aortic regurgitation - 05/2023 echo [facility]: LVEF >55%, mild to mod AI - 10/2023 LVEF 55-60%, no WMAs, mod AI Past Medical History:  Diagnosis Date   Aortic valve disease    Long-standing systolic murmur   Asthma    Uses p.r.n. albuterol    Bronchitis    COPD (chronic obstructive pulmonary disease) (HCC)    Degenerative joint disease    right knee   Depression    Depression with anxiety    Diabetes mellitus    Dyspnea    exertion   Dyspnea on exertion    poor exercise tolerance   Fibroids    uterine; postmenopausal bleeding    GERD (gastroesophageal reflux disease)    Headache(784.0)    Hyperlipidemia    Hypertension    Myocardial infarction (HCC)    Obesity    Palpitations    Stroke (HCC)    TIA - left side residual   Syncope    exertional   Tobacco user    30 pack years; 04/2011: 1/4 pack per day during quick attempt     Allergies  Allergen Reactions   Bayer Aspirin  [Aspirin ] Itching    Reaction to 325mg  or higher - OK to take low dose ASA daily.   Penicillins Rash     Current Outpatient Medications  Medication Sig Dispense Refill   albuterol  (PROVENTIL ,VENTOLIN ) 90 MCG/ACT inhaler Inhale 2 puffs into the lungs every 6 (six) hours as needed for wheezing or shortness of breath.     aspirin  EC 81 MG tablet Take 81 mg by mouth daily.     buprenorphine (BUTRANS) 5 MCG/HR PTWK Place 1 patch onto the skin every Sunday.     chlorthalidone  (HYGROTON ) 50 MG tablet Take 50 mg by mouth daily.     citalopram (CELEXA) 20 MG tablet Take 20 mg  by mouth daily.     clopidogrel  (PLAVIX ) 75 MG tablet Take 75 mg by mouth daily.     Continuous Glucose Sensor (DEXCOM G7 SENSOR) MISC CHANGE EVERY 10 DAYS     ezetimibe  (ZETIA ) 10 MG tablet TAKE 1 TABLET BY MOUTH ONCE DAILY 90 tablet 1   furosemide  (LASIX ) 20 MG tablet TAKE 1 TABLET BY MOUTH DAILY AS NEEDED FOR SWELLING 30 tablet 3   gabapentin  (NEURONTIN ) 300 MG capsule Take 300 mg by mouth 3 (three) times daily.     hydrALAZINE  (APRESOLINE ) 50 MG tablet TAKE 1 TABLET BY MOUTH TWICE DAILY *PATIENT NEEDS APPOINTMENT FOR FURTHER REFILLS PLEASE HAVE PATIENT CONTACT OUR OFFICE [phone]* 180 tablet 3   Insulin  Disposable Pump (OMNIPOD DASH PODS, GEN 4,) MISC Change every 48 hours     insulin  lispro (HUMALOG) 100 UNIT/ML injection Inject 100 Units into the skin continuous. Max daily dose of 100 units per day via insulin  pump.     JARDIANCE  25 MG TABS tablet Take 25 mg by mouth daily.      [START ON 07/09/2025] lamoTRIgine  (LAMICTAL  XR) 50 MG 24 hour tablet Take 1 tablet (50  mg total) by mouth daily. (Patient not taking: Reported on 06/22/2024) 30 tablet 0   lamoTRIgine  (LAMICTAL ) 25 MG tablet Take 1 tablet (25 mg total) by mouth daily. 25 mg daily x 10 days, then 50 mg daily for 10 days 30 tablet 0   methocarbamol  (ROBAXIN ) 500 MG tablet Take 500 mg by mouth at bedtime.     olmesartan (BENICAR) 40 MG tablet Take 40 mg by mouth daily.     omeprazole  (PRILOSEC) 20 MG capsule Take 1 capsule (20 mg total) by mouth daily. 90 capsule 1   oxybutynin (DITROPAN-XL) 5 MG 24 hr tablet Take 5 mg by mouth at bedtime.     risperiDONE  (RISPERDAL ) 0.5 MG tablet Take 0.5 mg by mouth at bedtime.     rosuvastatin  (CRESTOR ) 40 MG tablet TAKE 1 TABLET BY MOUTH EVERY DAY AT BEDTIME 90 tablet 2   No current facility-administered medications for this visit.     Past Surgical History:  Procedure Laterality Date   ABDOMINAL HYSTERECTOMY  12/21/2011   Procedure: HYSTERECTOMY ABDOMINAL;  Surgeon: Norleen LULLA Server, MD;  Location: [facility];  Service: Gynecology;  Laterality: N/A;   CATARACT EXTRACTION W/PHACO Left 10/03/2020   Procedure: CATARACT EXTRACTION PHACO AND INTRAOCULAR LENS PLACEMENT LEFT EYE;  Surgeon: Harrie Agent, MD;  Location: [facility];  Service: Ophthalmology;  Laterality: Left;  CDE 5.04   CATARACT EXTRACTION W/PHACO Right 10/17/2020   Procedure: CATARACT EXTRACTION PHACO AND INTRAOCULAR LENS PLACEMENT RIGHT EYE;  Surgeon: Harrie Agent, MD;  Location: [facility];  Service: Ophthalmology;  Laterality: Right;  CDE: 4.60   COLONOSCOPY WITH PROPOFOL  N/A 02/14/2024   Procedure: COLONOSCOPY WITH PROPOFOL ;  Surgeon: Cinderella Deatrice FALCON, MD;  Location: [facility];  Service: Endoscopy;  Laterality: N/A;  9:45AM;ASA 3   LEFT HEART CATH AND CORONARY ANGIOGRAPHY N/A 02/17/2021   Procedure: LEFT HEART CATH AND CORONARY ANGIOGRAPHY;  Surgeon: Wonda Sharper, MD;  Location: [facility] INVASIVE CV LAB;  Service: Cardiovascular;  Laterality: N/A;   POLYPECTOMY  02/14/2024   Procedure: POLYPECTOMY;   Surgeon: Cinderella Deatrice FALCON, MD;  Location: [facility];  Service: Endoscopy;;   SUBMUCOSAL INJECTION  02/14/2024   Procedure: INJECTION, SUBMUCOSAL;  Surgeon: Cinderella Deatrice FALCON, MD;  Location: [facility];  Service: Endoscopy;;   TUBAL LIGATION       Allergies  Allergen  Reactions   Bayer Aspirin  [Aspirin ] Itching    Reaction to 325mg  or higher - OK to take low dose ASA daily.   Penicillins Rash      Family History  Problem Relation Age of Onset   Lung cancer Mother    Heart disease Brother    Breast cancer Sister    Heart disease Maternal Aunt    Cancer Cousin        lung   Anesthesia problems Neg Hx    Hypotension Neg Hx    Malignant hyperthermia Neg Hx    Pseudochol deficiency Neg Hx      Social History Amber Harris reports that she quit smoking about 18 months ago. Her smoking use included cigarettes. She started smoking about 50 years ago. She has a 24.3 pack-year smoking history. She has never used smokeless tobacco. Amber Harris reports no history of alcohol use.   Review of Systems CONSTITUTIONAL: No weight loss, fever, chills, weakness or fatigue.  HEENT: Eyes: No visual loss, blurred vision, double vision or yellow sclerae.No hearing loss, sneezing, congestion, runny nose or sore throat.  SKIN: No rash or itching.  CARDIOVASCULAR:  RESPIRATORY: No shortness of breath, cough or sputum.  GASTROINTESTINAL: No anorexia, nausea, vomiting or diarrhea. No abdominal pain or blood.  GENITOURINARY: No burning on urination, no polyuria NEUROLOGICAL: No headache, dizziness, syncope, paralysis, ataxia, numbness or tingling in the extremities. No change in bowel or bladder control.  MUSCULOSKELETAL: No muscle, back pain, joint pain or stiffness.  LYMPHATICS: No enlarged nodes. No history of splenectomy.  PSYCHIATRIC: No history of depression or anxiety.  ENDOCRINOLOGIC: No reports of sweating, cold or heat intolerance. No polyuria or polydipsia.  SABRA   Physical  Examination There were no vitals filed for this visit. There were no vitals filed for this visit.  Gen: resting comfortably, no acute distress HEENT: no scleral icterus, pupils equal round and reactive, no palptable cervical adenopathy,  CV Resp: Clear to auscultation bilaterally GI: abdomen is soft, non-tender, non-distended, normal bowel sounds, no hepatosplenomegaly MSK: extremities are warm, no edema.  Skin: warm, no rash Neuro:  no focal deficits Psych: appropriate affect   Diagnostic Studies 10/2012 Event monitor: no arrhythmias     08/2010 Echo:  Left ventricle: The cavity size was normal. Wall thickness was   normal. Systolic function was normal. The estimated ejection   fraction was in the range of 60% to 65%. Wall motion was normal;   there were no regional wall motion abnormalities. The study is not   technically sufficient to allow evaluation of LV diastolic   function.   - Aortic valve: Mildly calcified annulus. Mild regurgitation.   - Mitral valve: Mild regurgitation.   - Tricuspid valve: Mild regurgitation.   - Pericardium, extracardiac: There was no pericardial effusion.     11/14/13 Clinic EKG   Sinus rhythm     10/2013 Echo   LVEF 60-65%, grade II diastolic dysfunction, mild AI, mild MR     01/23/14 Clinic EKG   NSR, LAE, no ischemic changes   01/2021 echo IMPRESSIONS     1. Left ventricular ejection fraction, by estimation, is 60 to 65%. The  left ventricle has normal function. The left ventricle has no regional  wall motion abnormalities. There is moderate concentric left ventricular  hypertrophy. Left ventricular  diastolic parameters are indeterminate. Elevated left ventricular  end-diastolic pressure.   2. Right ventricular systolic function is normal. The right ventricular  size is normal. Tricuspid regurgitation signal  is inadequate for assessing  PA pressure.   3. The mitral valve is normal in structure. Trivial mitral valve  regurgitation.  No evidence of mitral stenosis.   4. The aortic valve is calcified. Aortic valve regurgitation is mild.  Mild to moderate aortic valve sclerosis/calcification is present, without  any evidence of aortic stenosis. Focal dense calcification of the left  coornary cusp is present. Aortic  regurgitation PHT measures 543 msec.   5. The inferior vena cava is dilated in size with <50% respiratory  variability, suggesting right atrial pressure of 15 mmHg.   01/2021 cath 1. Widely patent coronary arteries with no obstructive CAD 2. Normal LV function     05/2023 echo [facility] Summary   1. The left ventricle is normal in size with normal wall thickness.    2. The left ventricular systolic function is normal, LVEF is visually  estimated at > 55%.    3. There is mild to moderate aortic regurgitation.    4. The right ventricle is normal in size, with normal systolic function.    5. There is no evidence of an interatrial flow communication or  intrapulmonary shunt by agitated saline study.     Assessment and Plan   Resistant HTN -well controlled, continue current meds   2. Hyperlipidemia   -LDL is at goal, due to multiple CVAs goal LDL would be <55   3. Chronic diastolic heart failure   -no symptoms, euvolemic today - continue current meds     Dorn PHEBE Ross, M.D., F.A.C.C.

## 2024-12-20 ENCOUNTER — Other Ambulatory Visit: Payer: Self-pay | Admitting: Cardiology

## 2025-01-01 ENCOUNTER — Ambulatory Visit: Admitting: Cardiology

## 2025-03-25 ENCOUNTER — Ambulatory Visit: Admitting: Cardiology
